# Patient Record
Sex: Female | Born: 1965 | Race: Black or African American | Hispanic: No | Smoking: Former smoker
Health system: Southern US, Community
[De-identification: ages and names within clinical notes are randomized; demographics above are authoritative.]

## PROBLEM LIST (undated history)

## (undated) DIAGNOSIS — R202 Paresthesia of skin: Secondary | ICD-10-CM

## (undated) DIAGNOSIS — E7800 Pure hypercholesterolemia, unspecified: Secondary | ICD-10-CM

## (undated) DIAGNOSIS — E119 Type 2 diabetes mellitus without complications: Secondary | ICD-10-CM

## (undated) DIAGNOSIS — F32A Depression, unspecified: Secondary | ICD-10-CM

## (undated) DIAGNOSIS — I10 Essential (primary) hypertension: Secondary | ICD-10-CM

## (undated) DIAGNOSIS — Z973 Presence of spectacles and contact lenses: Secondary | ICD-10-CM

## (undated) DIAGNOSIS — F329 Major depressive disorder, single episode, unspecified: Secondary | ICD-10-CM

## (undated) DIAGNOSIS — R51 Headache: Secondary | ICD-10-CM

## (undated) DIAGNOSIS — F419 Anxiety disorder, unspecified: Secondary | ICD-10-CM

## (undated) DIAGNOSIS — M1990 Unspecified osteoarthritis, unspecified site: Secondary | ICD-10-CM

## (undated) DIAGNOSIS — R519 Headache, unspecified: Secondary | ICD-10-CM

## (undated) DIAGNOSIS — J302 Other seasonal allergic rhinitis: Secondary | ICD-10-CM

## (undated) DIAGNOSIS — K429 Umbilical hernia without obstruction or gangrene: Secondary | ICD-10-CM

## (undated) DIAGNOSIS — N189 Chronic kidney disease, unspecified: Secondary | ICD-10-CM

## (undated) DIAGNOSIS — R200 Anesthesia of skin: Secondary | ICD-10-CM

## (undated) HISTORY — PX: MULTIPLE TOOTH EXTRACTIONS: SHX2053

## (undated) HISTORY — DX: Umbilical hernia without obstruction or gangrene: K42.9

## (undated) HISTORY — PX: TONSILLECTOMY: SUR1361

## (undated) HISTORY — DX: Essential (primary) hypertension: I10

---
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
## 2001-07-26 ENCOUNTER — Other Ambulatory Visit: Admission: RE | Admit: 2001-07-26 | Discharge: 2001-07-26 | Payer: Self-pay | Admitting: Family Medicine

## 2003-04-02 ENCOUNTER — Other Ambulatory Visit: Admission: RE | Admit: 2003-04-02 | Discharge: 2003-04-02 | Payer: Self-pay | Admitting: Family Medicine

## 2003-04-02 ENCOUNTER — Other Ambulatory Visit: Admission: RE | Admit: 2003-04-02 | Discharge: 2003-04-02 | Payer: Self-pay | Admitting: Anesthesiology

## 2005-07-21 ENCOUNTER — Other Ambulatory Visit: Admission: RE | Admit: 2005-07-21 | Discharge: 2005-07-21 | Payer: Self-pay | Admitting: Family Medicine

## 2006-11-11 ENCOUNTER — Other Ambulatory Visit: Admission: RE | Admit: 2006-11-11 | Discharge: 2006-11-11 | Payer: Self-pay | Admitting: Family Medicine

## 2007-07-20 ENCOUNTER — Emergency Department (HOSPITAL_COMMUNITY): Admission: EM | Admit: 2007-07-20 | Discharge: 2007-07-21 | Payer: Self-pay | Admitting: Emergency Medicine

## 2007-10-18 ENCOUNTER — Encounter: Admission: RE | Admit: 2007-10-18 | Discharge: 2007-11-21 | Payer: Self-pay | Admitting: Family Medicine

## 2008-05-14 ENCOUNTER — Other Ambulatory Visit: Admission: RE | Admit: 2008-05-14 | Discharge: 2008-05-14 | Payer: Self-pay | Admitting: Family Medicine

## 2009-05-15 ENCOUNTER — Other Ambulatory Visit: Admission: RE | Admit: 2009-05-15 | Discharge: 2009-05-15 | Payer: Self-pay | Admitting: Family Medicine

## 2010-02-09 ENCOUNTER — Ambulatory Visit: Payer: Self-pay | Admitting: Family Medicine

## 2010-02-09 DIAGNOSIS — F329 Major depressive disorder, single episode, unspecified: Secondary | ICD-10-CM

## 2010-02-09 DIAGNOSIS — J309 Allergic rhinitis, unspecified: Secondary | ICD-10-CM | POA: Insufficient documentation

## 2010-02-15 ENCOUNTER — Encounter (INDEPENDENT_AMBULATORY_CARE_PROVIDER_SITE_OTHER): Payer: Self-pay | Admitting: *Deleted

## 2010-08-31 ENCOUNTER — Encounter: Payer: Self-pay | Admitting: Family Medicine

## 2010-09-07 LAB — CONVERTED CEMR LAB
Basophils Relative: 0 % (ref 0–1)
CO2: 23 meq/L (ref 19–32)
Chloride: 105 meq/L (ref 96–112)
Creatinine, Ser: 1.3 mg/dL — ABNORMAL HIGH (ref 0.40–1.20)
Eosinophils Absolute: 0.1 10*3/uL (ref 0.0–0.7)
Glucose, Bld: 146 mg/dL — ABNORMAL HIGH (ref 70–99)
Glucose, Urine, Semiquant: NEGATIVE
Ketones, urine, test strip: NEGATIVE
Lymphocytes Relative: 50 % — ABNORMAL HIGH (ref 12–46)
Lymphs Abs: 2.5 10*3/uL (ref 0.7–4.0)
MCHC: 31.9 g/dL (ref 30.0–36.0)
Monocytes Relative: 7 % (ref 3–12)
Neutro Abs: 2 10*3/uL (ref 1.7–7.7)
Platelets: 325 10*3/uL (ref 150–400)
Potassium: 4.6 meq/L (ref 3.5–5.3)
Sodium: 138 meq/L (ref 135–145)
Specific Gravity, Urine: 1.02
TSH: 1.293 microintl units/mL (ref 0.350–4.500)
Trich, Wet Prep: NONE SEEN
Urobilinogen, UA: 0.2
WBC Urine, dipstick: NEGATIVE
WBC, Wet Prep HPF POC: NONE SEEN
Yeast Wet Prep HPF POC: NONE SEEN
pH: 5

## 2010-09-09 NOTE — Letter (Signed)
Summary: Handout Printed  Printed Handout:  - Fibroids (Leiomyoma's)  Appended Document:  Pt. inquired at the front desk without stating she was a pt. as to when Dr. Laural Benes would next be working.   I told her next weekend and she said OK and walked off.  She returned about 1-2 hours later and stated she had  seen Dr. Laural Benes and had an allergic reaction to medications that had been prescribed to her last week. I consulted with Dr. Thurmond Butts and he said we should see her.  I told the pt. Dr.wade could see her today. She then became agitated, refused to be seen, and walked off angrily.  R. Hils LPN

## 2010-09-09 NOTE — Letter (Signed)
Summary: Handout Printed  Printed Handout:  - *Patient Instructions-Urgent Care 

## 2010-09-09 NOTE — Assessment & Plan Note (Signed)
Summary: POSSIBLE UTI/EVM   Vital Signs:  Patient Profile:   45 Years Old Female CC:      irregular periods started last month // blood in urine now Height:     63 inches Weight:      284 pounds BMI:     50.49 BSA:     2.25 Temp:     98 degrees F oral Pulse rate:   80 / minute Pulse rhythm:   regular Resp:     20 per minute BP sitting:   130 / 80  (left arm) Cuff size:   large  Pt. in pain?   yes    Location:   pelvis    Intensity:   2  Vitals Entered By: Providence Crosby LPN (February 09, 2950 1:17 PM)                   Current Allergies (reviewed today): ! * TOMATOES ! * SEAFOOD ! * EGGSHistory of Present Illness History from: patient Reason for visit: see chief complaint Chief Complaint: irregular periods started last month // blood in urine now History of Present Illness: Patient states last month period lasted one week and had severe cramping then this month she had clots.  Yesterday back pain and mid epigastric pain started but no nausea or vomiting or fever.  The patient said that she is concerned because her menses for this month have persisted for 2 weeks.  She says that she talked with her mom and she says that the family has a history of fibroids.  Pt says that the pains are not sharp and that the pains are mild.  No history of kidney stones.  Pt says that she also has been having mood changes and anxiety and stress.  Pt says that she has had some depression.   No constipation or fever or skin changes reported.  Pt reports that the lower abdominal pains and suprapubic pains have been intermittent and no predictable except when pressing on the abdomen.    Current Problems: ? 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) ABDOMINAL PAIN, SUPRAPUBIC (ICD-789.09) DEPRESSION (ICD-311) ALLERGIC RHINITIS (ICD-477.9) MENORRHAGIA (ICD-626.2) ABDOMINAL CRAMPS (ICD-789.00) PELVIC PAIN, ACUTE (ICD-789.09) GROSS HEMATURIA (ICD-599.71)   Current Meds ZYRTEC HIVES RELIEF 10 MG TABS (CETIRIZINE  HCL) takes 1/4 th of a pill as needed CIPROFLOXACIN HCL 500 MG TABS (CIPROFLOXACIN HCL) take 1 by mouth two times a day until all gone FLAGYL 500 MG TABS (METRONIDAZOLE) take 1 by mouth three times a day until all gone NAPROXEN SODIUM 550 MG TABS (NAPROXEN SODIUM) take 1 by mouth Q 12 hours with food as needed for abdominal, pelvic cramps  REVIEW OF SYSTEMS Constitutional Symptoms       Complains of weight gain.     Denies fever, chills, night sweats, weight loss, and fatigue.      Comments: state stays hot and prespiring Eyes       Denies change in vision, eye pain, eye discharge, glasses, contact lenses, and eye surgery. Ear/Nose/Throat/Mouth       Complains of sinus problems.      Denies hearing loss/aids, change in hearing, ear pain, ear discharge, dizziness, frequent runny nose, frequent nose bleeds, sore throat, hoarseness, and tooth pain or bleeding.  Respiratory       Denies dry cough, productive cough, wheezing, shortness of breath, asthma, bronchitis, and emphysema/COPD.  Cardiovascular       Denies murmurs, chest pain, and tires easily with exhertion.    Gastrointestinal  Complains of stomach pain.      Denies nausea/vomiting, diarrhea, constipation, blood in bowel movements, and indigestion.      Comments: suprapubic tenderness reported Genitourniary       Complains of painful urination, blood or discharge from vagina, and loss of urinary control.      Denies kidney stones. Neurological       Denies paralysis, seizures, and fainting/blackouts. Musculoskeletal       Complains of muscle pain and joint pain.      Denies joint stiffness, decreased range of motion, redness, swelling, muscle weakness, and gout.  Skin       Denies bruising, unusual mles/lumps or sores, and hair/skin or nail changes.  Psych       Complains of mood changes, anxiety/stress, and depression.      Denies temper/anger issues, speech problems, and sleep problems.  Past History:  Past Medical  History: Allergic rhinitis Depression  Past Surgical History: Denies surgical history  Family History: Father: deceased at 13 yo diabetes and high blood pressure Mother: alive diabetic Siblings: 1 brother alive and well 1 sister alive and well Uterine leiomyoma in the women in the family  Social History: Marital Status: Married Children: She has a daughter that just graduated high school Current Smoker - 1/2 ppd Alcohol use-yes Regular exercise-no  Works at [employer] Smoking Status:  current Does Patient Exercise:  no   Physical Exam General appearance: well developed, well nourished, no acute distress Head: normocephalic, atraumatic Eyes: conjunctivae and lids normal Pupils: equal, round, reactive to light Ears: normal, no lesions or deformities Nasal: mucosa pink, nonedematous, no septal deviation, turbinates normal Oral/Pharynx: tongue normal, posterior pharynx without erythema or exudate, dry mucus membranes Neck: neck supple,  trachea midline, no masses Thyroid: soft, no nodules or masses palpated Chest/Lungs: no rales, wheezes, or rhonchi bilateral, breath sounds equal without effort Heart: regular rate and  rhythm, no murmur Abdomen: soft, non-tender without obvious organomegaly GU: suprapubic tenderness, mild left CVA tenderness, pelvic exam: blood seen in vaginal introitus, cervix nontender, no discharge seen, no pelvic masses palpated Extremities: normal extremities Neurological: grossly intact and non-focal Back: left CVA tenderness Skin: no obvious rashes or lesions MSE: oriented to time, place, and person Assessment Problems:   ? 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 of LEIOMYOMA, UTERUS  (ICD-218.9) ABDOMINAL PAIN, SUPRAPUBIC (ICD-789.09) DEPRESSION (ICD-311) ALLERGIC RHINITIS (ICD-477.9) MENORRHAGIA (ICD-626.2) ABDOMINAL CRAMPS (ICD-789.00) PELVIC PAIN, ACUTE (ICD-789.09) GROSS HEMATURIA (ICD-599.71)   Patient Education: Patient and/or caregiver instructed in the following: rest, fluids, quit smoking. Demonstrates willingness to comply.  Plan New Medications/Changes: NAPROXEN SODIUM 550 MG TABS (NAPROXEN SODIUM) take 1 by mouth Q 12 hours with food as needed for abdominal, pelvic cramps  #10 x 0, 02/09/2010, Erric Machnik MD FLAGYL 500 MG TABS (METRONIDAZOLE) take 1 by mouth three times a day until all gone  #21 x 0, 02/09/2010, Chrislyn Seedorf MD CIPROFLOXACIN HCL 500 MG TABS (CIPROFLOXACIN HCL) take 1 by mouth two times a day until all gone  #14 x 0, 02/09/2010, Standley Dakins MD  New Orders: CBC-[facility] [85027] TSH-[facility] [[id number]] Basic Met-[facility] [09811-91478] Urine Culture-[facility] [[id number]] T-Wet Prep for Kizzie Bane Cells [[id number]] New Patient Level IV [28413] Prescription Created Electronically [phone] Follow Up: Follow up in 1-2 days if no improvement, Follow up with Primary Physician Follow Up: with gynecologist in 2 days  for pelvic ultrasound  The patient and/or caregiver has been counseled thoroughly with regard to medications prescribed including dosage, schedule, interactions, rationale for use, and possible side effects and they verbalize understanding.  Diagnoses and expected course of recovery discussed and will return if not improved as expected or if the condition worsens. Patient and/or caregiver verbalized understanding.  Prescriptions: NAPROXEN SODIUM 550 MG TABS (NAPROXEN SODIUM) take 1 by mouth Q 12 hours with food as needed for abdominal, pelvic cramps  #10 x 0   Entered and Authorized by:   Standley Dakins MD   Signed by:   Standley Dakins MD on 02/09/2010   Method used:   Electronically to        [facility]  [address]  [phone][facility] (retail)       [address]       [place], [place]  [id number]       Ph: [phone] or [phone]       Fax: [phone]   RxID:   [id number] FLAGYL 500 MG TABS (METRONIDAZOLE) take 1 by mouth three times a day until all gone  #21 x 0   Entered and Authorized by:   Standley Dakins MD   Signed by:   Standley Dakins MD on 02/09/2010   Method used:   [facility] to        [facility]  [address] [phone]* (retail)       [address]       [place], [place]  [address]       Ph: [phone] or [phone]       Fax: [phone]   RxID:   [id number] CIPROFLOXACIN HCL 500 MG TABS (CIPROFLOXACIN HCL) take 1 by mouth two times a day until all gone  #14 x 0   Entered and Authorized by:   Standley Dakins MD   Signed by:   Standley Dakins MD on 02/09/2010   Method used:   [facility]        [facility]  [address] [phone]* (retail)       [address]       [place], [place]  [id number]       Ph: [phone] or [phone]       Fax: [phone]   RxID:   [id number]   Patient Instructions: 1)  Please follow up with your PCP and gynecologist in the next 2-3 days to have a pelvic ultrasound done and to follow up. I am concerned that you may have a uterine fibroid.  2)  Go to the ER if your symptoms become worse or new changes develop.  3)  Take your antibiotics until all gone. 4)  We should be calling you about your labs in the next several days.  If you haven't heard from [place] about your labs, please call the clinic and ask about your labs.  5)  Stop taking the Aleve, the Naproxen will replace the Aleve.  6)  There is no on-call provider or services available at this clinic during off-hours (when the clinic is closed).  If you developed a problem or concern that required immediate attention go the the nearest available urgent care or emergency department for medical care.   7)   This [facility] is not intended to be a primary care clinic.  The patient is always better served by the  continuity of care and the provider/patient relationships developed with their dedicated primary care provider.  The patient was told to be sure to follow up as soon as possible with their primary care provider to discuss treatments received and to receive further examination and testing.  The patient verbalized understanding. The will f/u with PCP ASAP.  8)  Drink as much fluid as you can tolerate for the next few days. 9)  Tobacco is very bad for your health and your loved ones! You Should stop smoking!. 10)  Stop Smoking Tips: Choose a Quit date. Cut down before the Quit date. decide what you will do as a substitute when you feel the urge to smoke(gum,toothpick,exercise).  Orders Added: 1)  CBC-[facility] [85027] 2)  TSH-[facility] [[id number]] 3)  Basic Met-[facility] [09811-91478] 4)  Urine Culture-[facility] [[id number]] 5)  T-Wet Prep for Christoper Allegra, Clue Cells [[id number]] 6)  New Patient Level IV [99204] 7)  Prescription Created Electronically [phone]  The risks, benefits and possible side effects were clearly explained and discussed with the patient.  The patient verbalized clear understanding.  The patient was given instructions to return if symptoms don't improve, worsen or new changes develop.  If it is not during clinic hours and the patient cannot get back to this clinic then the patient was told to seek medical care at an available urgent care or emergency department.  The patient verbalized understanding.    The patient was informed that there is no on-call provider or services available at this clinic during off-hours (when the clinic is closed).  If the patient developed a problem or concern that required immediate attention, the patient was advised to go the the nearest available urgent care or emergency department for medical care.  The patient verbalized understanding.     It was clearly  explained to the patient that this [facility] is not intended to be a primary care clinic.  The patient is always better served by the continuity of care and the provider/patient relationships developed with their dedicated primary care provider.  The patient was told to be sure to follow up as soon as possible with their primary care provider to discuss treatments received and to receive further examination and testing.  The patient verbalized understanding. The will f/u with PCP ASAP.   Rodney Langton, M.D., F.A.A.F.P.  February 09, 2010   Laboratory Results   Urine Tests  Date/Time Recieved: February 09, 2010 1:32 PM Date/Time Reported: February 09, 2010 1:32 PM  Routine Urinalysis   Color: amber Appearance: Cloudy Glucose: negative   (Normal Range: Negative) Bilirubin: negative   (Normal Range: Negative) Ketone: negative   (Normal Range: Negative) Spec. Gravity: 1.020   (Normal Range: 1.003-1.035) Blood: large   (Normal Range: Negative) pH: 5.0   (Normal Range: 5.0-8.0) Protein: trace   (Normal Range: Negative) Urobilinogen: 0.2   (Normal Range: 0-1) Nitrite: negative   (Normal Range: Negative) Leukocyte Esterace: negative   (Normal Range: Negative)

## 2010-11-18 ENCOUNTER — Emergency Department (HOSPITAL_COMMUNITY)
Admission: EM | Admit: 2010-11-18 | Discharge: 2010-11-19 | Disposition: A | Discharge status: Home / Self Care | Payer: No Typology Code available for payment source | Attending: Emergency Medicine | Admitting: Emergency Medicine

## 2010-11-18 ENCOUNTER — Emergency Department (HOSPITAL_COMMUNITY): Payer: No Typology Code available for payment source

## 2010-11-18 DIAGNOSIS — M5137 Other intervertebral disc degeneration, lumbosacral region: Secondary | ICD-10-CM | POA: Insufficient documentation

## 2010-11-18 DIAGNOSIS — M545 Low back pain, unspecified: Secondary | ICD-10-CM | POA: Insufficient documentation

## 2010-11-18 DIAGNOSIS — M25569 Pain in unspecified knee: Secondary | ICD-10-CM | POA: Insufficient documentation

## 2010-11-18 DIAGNOSIS — Q762 Congenital spondylolisthesis: Secondary | ICD-10-CM | POA: Insufficient documentation

## 2010-11-18 DIAGNOSIS — S8000XA Contusion of unspecified knee, initial encounter: Secondary | ICD-10-CM | POA: Insufficient documentation

## 2010-11-18 DIAGNOSIS — M546 Pain in thoracic spine: Secondary | ICD-10-CM | POA: Insufficient documentation

## 2010-11-18 DIAGNOSIS — M51379 Other intervertebral disc degeneration, lumbosacral region without mention of lumbar back pain or lower extremity pain: Secondary | ICD-10-CM | POA: Insufficient documentation

## 2011-05-18 LAB — BASIC METABOLIC PANEL
BUN: 7
GFR calc Af Amer: 51 — ABNORMAL LOW
Glucose, Bld: 100 — ABNORMAL HIGH
Sodium: 136

## 2011-05-18 LAB — DIFFERENTIAL
Basophils Absolute: 0
Basophils Relative: 1
Eosinophils Relative: 1
Lymphocytes Relative: 36
Lymphs Abs: 2.3
Monocytes Relative: 7
Neutro Abs: 3.5

## 2011-05-18 LAB — CBC
HCT: 36.8
Hemoglobin: 12.4
MCHC: 33.7
MCV: 74.5 — ABNORMAL LOW
RBC: 4.93
WBC: 6.3

## 2011-06-03 ENCOUNTER — Ambulatory Visit (INDEPENDENT_AMBULATORY_CARE_PROVIDER_SITE_OTHER): Payer: 59 | Admitting: Women's Health

## 2011-06-03 ENCOUNTER — Encounter: Payer: Self-pay | Admitting: Women's Health

## 2011-06-03 ENCOUNTER — Other Ambulatory Visit (HOSPITAL_COMMUNITY)
Admission: RE | Admit: 2011-06-03 | Discharge: 2011-06-03 | Disposition: A | Discharge status: Home / Self Care | Payer: 59 | Source: Ambulatory Visit | Attending: Women's Health | Admitting: Women's Health

## 2011-06-03 VITALS — BP 130/80 | Ht 62.0 in | Wt 268.0 lb

## 2011-06-03 DIAGNOSIS — Z01419 Encounter for gynecological examination (general) (routine) without abnormal findings: Secondary | ICD-10-CM

## 2011-06-03 DIAGNOSIS — Z13220 Encounter for screening for lipoid disorders: Secondary | ICD-10-CM

## 2011-06-03 DIAGNOSIS — Z833 Family history of diabetes mellitus: Secondary | ICD-10-CM

## 2011-06-03 NOTE — Patient Instructions (Addendum)
Fish oil supplement Vit D 2000 Have FUN !!!!  Schedule mammogram   Need yearly

## 2011-06-03 NOTE — Progress Notes (Signed)
Jasmine [name] October 30, 1965 [id number]    History:    The patient presents for annual exam.  Works at [employer]. Daughter Jasmine [name], 29, student doing well, has had Gardasil.  Past medical history, past surgical history, family history and social history were all reviewed and documented in the EPIC chart.   ROS:  A  ROS was performed and pertinent positives and negatives are included in the history.  Exam:  Filed Vitals:   06/03/11 0803  BP: 130/80    General appearance:  Normal Head/Neck:  Normal, without cervical or supraclavicular adenopathy. Thyroid:  Symmetrical, normal in size, without palpable masses or nodularity. Respiratory  Effort:  Normal  Auscultation:  Clear without wheezing or rhonchi Cardiovascular  Auscultation:  Regular rate, without rubs, murmurs or gallops  Edema/varicosities:  Not grossly evident Abdominal  Soft,nontender, without masses, guarding or rebound.  Liver/spleen:  No organomegaly noted  Hernia:  None appreciated  Skin  Inspection:  Grossly normal  Palpation:  Grossly normal Neurologic/psychiatric  Orientation:  Normal with appropriate conversation.  Mood/affect:  Normal  Genitourinary    Breasts: Examined lying and sitting.     Right: Without masses, retractions, discharge or axillary adenopathy.     Left: Without masses, retractions, discharge or axillary adenopathy.   Inguinal/mons:  Normal without inguinal adenopathy  External genitalia:  Normal  BUS/Urethra/Skene's glands:  Normal  Bladder:  Normal  Vagina:  Normal  Cervix:  Normal  Uterus:  normal in size, shape and contour.  Midline and mobile  Adnexa/parametria:     Rt: Without masses or tenderness.   Lt: Without masses or tenderness.  Anus and perineum: Normal  Digital rectal exam: Normal sphincter tone without palpated masses or tenderness  Assessment/Plan:  45 y.o.DBF G1P1  for annual exam without complaint. Monthly 3-4 day cycle, not sexually active. History of normal Paps,  GDM. Smokes one pack per week. Reviewed importance of no smoking, ways to decrease and quit.  Normal GYN exam Obese Smoker  Plan: SBEs, had a normal baseline but has not been back will schedule, did review importance of annual screening. Encouraged Weight Watchers for weight loss for health,(numerous family members with diabetes), cutting calories and increasing exercise. Condoms encouraged if becomes sexually active or return to the office for contraception counseling. Vitamin D and calcium rich diet encouraged.  CBC, glucose, lipid profile, UA and Pap     Harrington Challenger [facility], 8:43 AM 06/03/2011

## 2011-06-04 ENCOUNTER — Other Ambulatory Visit: Payer: Self-pay | Admitting: *Deleted

## 2011-06-04 DIAGNOSIS — R7309 Other abnormal glucose: Secondary | ICD-10-CM

## 2011-06-04 DIAGNOSIS — E7800 Pure hypercholesterolemia, unspecified: Secondary | ICD-10-CM

## 2012-06-03 ENCOUNTER — Encounter: Payer: Self-pay | Admitting: Women's Health

## 2012-06-03 ENCOUNTER — Ambulatory Visit (INDEPENDENT_AMBULATORY_CARE_PROVIDER_SITE_OTHER): Payer: 59 | Admitting: Women's Health

## 2012-06-03 VITALS — BP 136/86 | Ht 62.75 in | Wt 283.0 lb

## 2012-06-03 DIAGNOSIS — E785 Hyperlipidemia, unspecified: Secondary | ICD-10-CM

## 2012-06-03 DIAGNOSIS — Z6841 Body Mass Index (BMI) 40.0 and over, adult: Secondary | ICD-10-CM

## 2012-06-03 DIAGNOSIS — R109 Unspecified abdominal pain: Secondary | ICD-10-CM

## 2012-06-03 DIAGNOSIS — Z01419 Encounter for gynecological examination (general) (routine) without abnormal findings: Secondary | ICD-10-CM

## 2012-06-03 DIAGNOSIS — Z833 Family history of diabetes mellitus: Secondary | ICD-10-CM

## 2012-06-03 DIAGNOSIS — E079 Disorder of thyroid, unspecified: Secondary | ICD-10-CM

## 2012-06-03 LAB — CBC WITH DIFFERENTIAL/PLATELET
Basophils Absolute: 0.1 10*3/uL (ref 0.0–0.1)
Basophils Relative: 1 % (ref 0–1)
Eosinophils Relative: 2 % (ref 0–5)
Hemoglobin: 12.4 g/dL (ref 12.0–15.0)
MCH: 24.2 pg — ABNORMAL LOW (ref 26.0–34.0)
Monocytes Absolute: 0.3 10*3/uL (ref 0.1–1.0)
Monocytes Relative: 6 % (ref 3–12)
Platelets: 291 10*3/uL (ref 150–400)
RBC: 5.12 MIL/uL — ABNORMAL HIGH (ref 3.87–5.11)
WBC: 4.2 10*3/uL (ref 4.0–10.5)

## 2012-06-03 LAB — LIPID PANEL
LDL Cholesterol: 152 mg/dL — ABNORMAL HIGH (ref 0–99)
Total CHOL/HDL Ratio: 5.4 Ratio
Triglycerides: 161 mg/dL — ABNORMAL HIGH (ref <150)
VLDL: 32 mg/dL (ref 0–40)

## 2012-06-03 NOTE — Progress Notes (Signed)
Jasmine [name] 1966/03/05 [id number]    History:    The patient presents for annual exam.  Regular monthly cycle not sexually active for greater than one year. Morbid obesity. Complaint of intermittent mid abdominal epigastric pain. Denies nausea, fever, changes in bowel elimination. Last mammogram 2011, normal. History of normal Paps. Had elevated glucose of 118 last year without followup.   Past medical history, past surgical history, family history and social history were all reviewed and documented in the EPIC chart. Works at [employer]. Daughter Jasmine [name] doing well at [employer].   ROS:  A  ROS was performed and pertinent positives and negatives are included in the history.  Exam:  Filed Vitals:   06/03/12 0827  BP: 136/86    General appearance:  Normal Head/Neck:  Normal, without cervical or supraclavicular adenopathy. Thyroid:  Symmetrical, normal in size, without palpable masses or nodularity. Respiratory  Effort:  Normal  Auscultation:  Clear without wheezing or rhonchi Cardiovascular  Auscultation:  Regular rate, without rubs, murmurs or gallops  Edema/varicosities:  Not grossly evident Abdominal  Soft,nontender, without masses, guarding or rebound.  Liver/spleen:  No organomegaly noted  Hernia:  None appreciated  Skin  Inspection:  Grossly normal  Palpation:  Grossly normal Neurologic/psychiatric  Orientation:  Normal with appropriate conversation.  Mood/affect:  Normal  Genitourinary    Breasts: Examined lying and sitting.     Right: Without masses, retractions, discharge or axillary adenopathy.     Left: Without masses, retractions, discharge or axillary adenopathy.   Inguinal/mons:  Normal without inguinal adenopathy  External genitalia:  Normal  BUS/Urethra/Skene's glands:  Normal  Bladder:  Normal  Vagina:  Normal  Cervix:  Normal  Uterus:  normal in size,  Midline and mobile/ difficult exam due to abdominal obesity  Adnexa/parametria:     Rt: Without masses or  tenderness.   Lt: Without masses or tenderness.  Anus and perineum: Normal  Digital rectal exam: Normal sphincter tone without palpated masses or tenderness  Assessment/Plan:  46 y.o. separated BF G1P1 for annual exam with complaint of intermittent epigastric pain.  Morbid obesity/trunk obesity History of elevated glucose/abnormal lipid panel without followup.  Overdue for mammogram.  Plan: Discussion on need for weight loss for health. Bariatric clinic number at [place] given. SBE's, instructed to schedule mammogram, [facility] phone number given. Encouraged to continue daily walking, vitamin D 1000 daily and fish oil supplement. CBC, glucose, lipid panel, TSH, UA. Pap normal 2012, new screening guidelines reviewed. Condoms encouraged when sexually active, declines need for contraception or STD screen. GYN ultrasound, limited exam and abdominal pain. Reviewed importance of weight loss in relationship to epigastric pain. Blood pressure 136/88, instructed to monitor and if continues greater than 130/80 to followup with primary care.  Harrington Challenger [facility], 9:09 AM 06/03/2012

## 2012-06-03 NOTE — Patient Instructions (Addendum)
Vit D 1000 daily Mammogram please schedule!!! Fish oil fish daily Health Maintenance, Females A healthy lifestyle and preventative care can promote health and wellness.  Maintain regular health, dental, and eye exams.  Eat a healthy diet. Foods like vegetables, fruits, whole grains, low-fat dairy products, and lean protein foods contain the nutrients you need without too many calories. Decrease your intake of foods high in solid fats, added sugars, and salt. Get information about a proper diet from your caregiver, if necessary.  Regular physical exercise is one of the most important things you can do for your health. Most adults should get at least 150 minutes of moderate-intensity exercise (any activity that increases your heart rate and causes you to sweat) each week. In addition, most adults need muscle-strengthening exercises on 2 or more days a week.   Maintain a healthy weight. The body mass index (BMI) is a screening tool to identify possible weight problems. It provides an estimate of body fat based on height and weight. Your caregiver can help determine your BMI, and can help you achieve or maintain a healthy weight. For adults 20 years and older:  A BMI below 18.5 is considered underweight.  A BMI of 18.5 to 24.9 is normal.  A BMI of 25 to 29.9 is considered overweight.  A BMI of 30 and above is considered obese.  Maintain normal blood lipids and cholesterol by exercising and minimizing your intake of saturated fat. Eat a balanced diet with plenty of fruits and vegetables. Blood tests for lipids and cholesterol should begin at age 61 and be repeated every 5 years. If your lipid or cholesterol levels are high, you are over 50, or you are a high risk for heart disease, you may need your cholesterol levels checked more frequently.Ongoing high lipid and cholesterol levels should be treated with medicines if diet and exercise are not effective.  If you smoke, find out from your  caregiver how to quit. If you do not use tobacco, do not start.  If you are pregnant, do not drink alcohol. If you are breastfeeding, be very cautious about drinking alcohol. If you are not pregnant and choose to drink alcohol, do not exceed 1 drink per day. One drink is considered to be 12 ounces (355 mL) of beer, 5 ounces (148 mL) of wine, or 1.5 ounces (44 mL) of liquor.  Avoid use of street drugs. Do not share needles with anyone. Ask for help if you need support or instructions about stopping the use of drugs.  High blood pressure causes heart disease and increases the risk of stroke. Blood pressure should be checked at least every 1 to 2 years. Ongoing high blood pressure should be treated with medicines, if weight loss and exercise are not effective.  If you are 64 to 44 years old, ask your caregiver if you should take aspirin to prevent strokes.  Diabetes screening involves taking a blood sample to check your fasting blood sugar level. This should be done once every 3 years, after age 65, if you are within normal weight and without risk factors for diabetes. Testing should be considered at a younger age or be carried out more frequently if you are overweight and have at least 1 risk factor for diabetes.  Breast cancer screening is essential preventative care for women. You should practice "breast self-awareness." This means understanding the normal appearance and feel of your breasts and may include breast self-examination. Any changes detected, no matter how small, should be reported  to a caregiver. Women in their 52s and 30s should have a clinical breast exam (CBE) by a caregiver as part of a regular health exam every 1 to 3 years. After age 2, women should have a CBE every year. Starting at age 36, women should consider having a mammogram (breast X-ray) every year. Women who have a family history of breast cancer should talk to their caregiver about genetic screening. Women at a high risk of  breast cancer should talk to their caregiver about having an MRI and a mammogram every year.  The Pap test is a screening test for cervical cancer. Women should have a Pap test starting at age 58. Between ages 60 and 66, Pap tests should be repeated every 2 years. Beginning at age 30, you should have a Pap test every 3 years as long as the past 3 Pap tests have been normal. If you had a hysterectomy for a problem that was not cancer or a condition that could lead to cancer, then you no longer need Pap tests. If you are between ages 56 and 76, and you have had normal Pap tests going back 10 years, you no longer need Pap tests. If you have had past treatment for cervical cancer or a condition that could lead to cancer, you need Pap tests and screening for cancer for at least 20 years after your treatment. If Pap tests have been discontinued, risk factors (such as a new sexual partner) need to be reassessed to determine if screening should be resumed. Some women have medical problems that increase the chance of getting cervical cancer. In these cases, your caregiver may recommend more frequent screening and Pap tests.  The human papillomavirus (HPV) test is an additional test that may be used for cervical cancer screening. The HPV test looks for the virus that can cause the cell changes on the cervix. The cells collected during the Pap test can be tested for HPV. The HPV test could be used to screen women aged 59 years and older, and should be used in women of any age who have unclear Pap test results. After the age of 22, women should have HPV testing at the same frequency as a Pap test.  Colorectal cancer can be detected and often prevented. Most routine colorectal cancer screening begins at the age of 25 and continues through age 33. However, your caregiver may recommend screening at an earlier age if you have risk factors for colon cancer. On a yearly basis, your caregiver may provide home test kits to check  for hidden blood in the stool. Use of a small camera at the end of a tube, to directly examine the colon (sigmoidoscopy or colonoscopy), can detect the earliest forms of colorectal cancer. Talk to your caregiver about this at age 32, when routine screening begins. Direct examination of the colon should be repeated every 5 to 10 years through age 13, unless early forms of pre-cancerous polyps or small growths are found.  Hepatitis C blood testing is recommended for all people born from [birth year] through 1963 and any individual with known risks for hepatitis C.  Practice safe sex. Use condoms and avoid high-risk sexual practices to reduce the spread of sexually transmitted infections (STIs). Sexually active women aged 35 and younger should be checked for Chlamydia, which is a common sexually transmitted infection. Older women with new or multiple partners should also be tested for Chlamydia. Testing for other STIs is recommended if you are sexually active and  at increased risk.  Osteoporosis is a disease in which the bones lose minerals and strength with aging. This can result in serious bone fractures. The risk of osteoporosis can be identified using a bone density scan. Women ages 24 and over and women at risk for fractures or osteoporosis should discuss screening with their caregivers. Ask your caregiver whether you should be taking a calcium supplement or vitamin D to reduce the rate of osteoporosis.  Menopause can be associated with physical symptoms and risks. Hormone replacement therapy is available to decrease symptoms and risks. You should talk to your caregiver about whether hormone replacement therapy is right for you.  Use sunscreen with a sun protection factor (SPF) of 30 or greater. Apply sunscreen liberally and repeatedly throughout the day. You should seek shade when your shadow is shorter than you. Protect yourself by wearing long sleeves, pants, a wide-brimmed hat, and sunglasses year round,  whenever you are outdoors.  Notify your caregiver of new moles or changes in moles, especially if there is a change in shape or color. Also notify your caregiver if a mole is larger than the size of a pencil eraser.  Stay current with your immunizations. Document Released: 02/09/2011 Document Revised: 10/19/2011 Document Reviewed: 02/09/2011 [facility] Patient Information 2013 [place], [place].

## 2012-06-04 LAB — URINALYSIS W MICROSCOPIC + REFLEX CULTURE
Bacteria, UA: NONE SEEN
Bilirubin Urine: NEGATIVE
Crystals: NONE SEEN
Glucose, UA: NEGATIVE mg/dL
Protein, ur: NEGATIVE mg/dL
Specific Gravity, Urine: 1.013 (ref 1.005–1.030)
pH: 5.5 (ref 5.0–8.0)

## 2012-06-08 ENCOUNTER — Other Ambulatory Visit: Payer: Self-pay | Admitting: Women's Health

## 2012-06-08 DIAGNOSIS — R7309 Other abnormal glucose: Secondary | ICD-10-CM

## 2012-06-13 ENCOUNTER — Encounter: Payer: Self-pay | Admitting: Women's Health

## 2012-06-13 ENCOUNTER — Ambulatory Visit (INDEPENDENT_AMBULATORY_CARE_PROVIDER_SITE_OTHER): Payer: 59

## 2012-06-13 ENCOUNTER — Other Ambulatory Visit: Payer: Self-pay | Admitting: Gynecology

## 2012-06-13 ENCOUNTER — Ambulatory Visit (INDEPENDENT_AMBULATORY_CARE_PROVIDER_SITE_OTHER): Payer: 59 | Admitting: Women's Health

## 2012-06-13 DIAGNOSIS — N831 Corpus luteum cyst of ovary, unspecified side: Secondary | ICD-10-CM

## 2012-06-13 DIAGNOSIS — Z833 Family history of diabetes mellitus: Secondary | ICD-10-CM

## 2012-06-13 DIAGNOSIS — R109 Unspecified abdominal pain: Secondary | ICD-10-CM

## 2012-06-13 DIAGNOSIS — E65 Localized adiposity: Secondary | ICD-10-CM

## 2012-06-13 DIAGNOSIS — R140 Abdominal distension (gaseous): Secondary | ICD-10-CM

## 2012-06-13 DIAGNOSIS — L919 Hypertrophic disorder of the skin, unspecified: Secondary | ICD-10-CM

## 2012-06-13 DIAGNOSIS — R142 Eructation: Secondary | ICD-10-CM

## 2012-06-13 LAB — HEMOGLOBIN A1C: Hgb A1c MFr Bld: 7.3 % — ABNORMAL HIGH (ref <5.7)

## 2012-06-13 NOTE — Progress Notes (Signed)
Patient ID: Jasmine [name], female   DOB: [birth year], 46 y.o.   MRN: [id number] Presents for ultrasound. At annual exam was having mid abdominal/epigastric-type pain that is cyclic, increases before cycle and is resolved after cycle.. Denies nausea, vomiting, constipation, heartburn or fever. States no pain today, last week a 5. Random glucose was 122, history of elevated blood sugars in the past.  Ultrasound: Anteverted uterus with normal echo pattern. Right ovary seen transabdominal normal. Left ovary thickwalled cystic filled lumen 24 x 17 x 19 mm/consistent with corpus luteal cyst. Negative cul-de-sac.  Elevated blood sugar/abdominal obesity Mid abdominal/epigastric cyclic pain  Plan: Reviewed ultrasound shows corpus luteal normal-type cyst. Reviewed importance of abdominal weight loss. Will check a fasting glucose and hemoglobin A1c refer as needed. If continued upper abdominal pain will have evaluated by primary care.

## 2012-06-14 LAB — GLUCOSE, RANDOM: Glucose, Bld: 123 mg/dL — ABNORMAL HIGH (ref 70–99)

## 2012-06-16 ENCOUNTER — Telehealth: Payer: Self-pay | Admitting: *Deleted

## 2012-06-16 DIAGNOSIS — R7309 Other abnormal glucose: Secondary | ICD-10-CM

## 2012-06-16 NOTE — Telephone Encounter (Signed)
Office notes faxed to Dr.Balan office they will fax me appointment time and date.

## 2012-06-16 NOTE — Telephone Encounter (Signed)
Message copied by Aura Camps on Thu Jun 16, 2012  3:11 PM ------      Message from: Keenan Bachelor      Created: Thu Jun 16, 2012  2:41 PM      Regarding: Needs referral to Dr. Talmage Nap office       Please call patient and inform hemoglobin A1c is elevated/diabetes range, average blood sugars 163 which is too high. Please schedule appointment for her with Dr. Talmage Nap for her if she has a primary care.             I spoke with patient about [place]'s recommendation above.  She does not have Primary Care.  I told her you would arrange and call her.            Thanks Agent Ted Mcalpine!!!

## 2012-06-17 ENCOUNTER — Other Ambulatory Visit: Payer: 59

## 2012-06-21 ENCOUNTER — Encounter: Payer: Self-pay | Admitting: Women's Health

## 2012-06-23 NOTE — Telephone Encounter (Signed)
Appointment 07/27/12 @ 10:00 left message for pt to call.

## 2012-06-23 NOTE — Telephone Encounter (Signed)
Pt informed with the below note. 

## 2013-01-12 ENCOUNTER — Telehealth: Payer: Self-pay

## 2013-01-12 NOTE — Telephone Encounter (Signed)
Patient called today asking about referral to doctor that [place] rec after Oct visit. I asked her was she referring to Dr. Talmage Nap. She was and said she never did go and see her because the appt never worked with her schedule.  She now has some hotflashes, nightsweats,  irritability and no period in 4 mos.  I explained to her that those symptoms are most likely related to peri-menopause/menopause and not her diabetes.  She did not seem to understand that she had diabetes.  I did explain to her that the labs were at a diabetic level previously and that is why [place] had recommended she go to see Dr. Talmage Nap or her primary care.  She does not want to make two visits one to [place] for menopausal sx and one to Dr. Talmage Nap. She decided that she would like to call her primary care MD at [facility] and go and see if they can help her with both issues.  I stressed the importance of following up regarding her diabetes.  Results were mailed to patient so she can take them to her PCP.

## 2013-01-24 ENCOUNTER — Emergency Department (HOSPITAL_COMMUNITY)
Admission: EM | Admit: 2013-01-24 | Discharge: 2013-01-24 | Disposition: A | Discharge status: Home / Self Care | Payer: 59 | Attending: Emergency Medicine | Admitting: Emergency Medicine

## 2013-01-24 ENCOUNTER — Encounter (HOSPITAL_COMMUNITY): Payer: Self-pay | Admitting: Emergency Medicine

## 2013-01-24 DIAGNOSIS — L0291 Cutaneous abscess, unspecified: Secondary | ICD-10-CM

## 2013-01-24 DIAGNOSIS — L02419 Cutaneous abscess of limb, unspecified: Secondary | ICD-10-CM | POA: Insufficient documentation

## 2013-01-24 DIAGNOSIS — F17200 Nicotine dependence, unspecified, uncomplicated: Secondary | ICD-10-CM | POA: Insufficient documentation

## 2013-01-24 DIAGNOSIS — E7800 Pure hypercholesterolemia, unspecified: Secondary | ICD-10-CM | POA: Insufficient documentation

## 2013-01-24 DIAGNOSIS — E119 Type 2 diabetes mellitus without complications: Secondary | ICD-10-CM | POA: Insufficient documentation

## 2013-01-24 DIAGNOSIS — Z79899 Other long term (current) drug therapy: Secondary | ICD-10-CM | POA: Insufficient documentation

## 2013-01-24 DIAGNOSIS — L03119 Cellulitis of unspecified part of limb: Secondary | ICD-10-CM | POA: Insufficient documentation

## 2013-01-24 HISTORY — DX: Pure hypercholesterolemia, unspecified: E78.00

## 2013-01-24 MED ORDER — SULFAMETHOXAZOLE-TRIMETHOPRIM 800-160 MG PO TABS: 2.0000 | ORAL_TABLET | Freq: Two times a day (BID) | ORAL | Status: DC

## 2013-01-24 MED ORDER — HYDROCODONE-ACETAMINOPHEN 5-325 MG PO TABS: 1.0000 | ORAL_TABLET | Freq: Four times a day (QID) | ORAL | Status: DC | PRN

## 2013-01-24 NOTE — ED Notes (Signed)
Pt states she has had abscess on R inner thigh for almost a week.  Pt states she has some drainage from abscess.

## 2013-01-24 NOTE — ED Provider Notes (Signed)
Medical screening examination/treatment/procedure(s) were performed by non-physician practitioner and as supervising physician I was immediately available for consultation/collaboration.  Caeson Filippi, MD 01/24/13 1612 

## 2013-01-24 NOTE — ED Provider Notes (Signed)
History     CSN: [id number]  Arrival date & time 01/24/13  0917   First MD Initiated Contact with Patient 01/24/13 1003      Chief Complaint  Patient presents with  . Abscess    (Consider location/radiation/quality/duration/timing/severity/associated sxs/prior treatment) HPI Comments: Patient with a history of DM and skin abscesses presents today with a skin abscess of the right posterior thigh.  Abscess has been present for the past week and is gradually worsening.  She reports that earlier today the abscess began to break open and drain a small amount of purulent fluid.  She has been applying warm compresses and also been placing a penny with bacon on the area.  She has been taking Ibuprofen for the pain, which gives her mild relief.  She denies fever or chills.  Denies nausea or vomiting.  Patient is a 47 y.o. female presenting with abscess. The history is provided by the patient.  Abscess Associated symptoms: no fever     Past Medical History  Diagnosis Date  . Diabetes mellitus without complication   . Hypercholesteremia     History reviewed. No pertinent past surgical history.  Family History  Problem Relation Age of Onset  . Diabetes Mother   . Diabetes Father   . Cancer Father     Prostate cancer  . Cancer Sister     Cervical cancer  . Diabetes Brother     History  Substance Use Topics  . Smoking status: Current Every Day Smoker  . Smokeless tobacco: Never Used  . Alcohol Use: No    OB History   Grav Para Term Preterm Abortions TAB SAB Ect Mult Living   1 1 1       1       Review of Systems  Constitutional: Negative for fever and chills.  Skin:       abscess  All other systems reviewed and are negative.    Allergies  Eggs or egg-derived products  Home Medications   Current Outpatient Rx  Name  Route  Sig  Dispense  Refill  . atorvastatin (LIPITOR) 10 MG tablet   Oral   Take 10 mg by mouth daily.         . Cetirizine HCl (ZYRTEC PO)  Oral   Take by mouth.           . metFORMIN (GLUCOPHAGE) 500 MG tablet   Oral   Take 500 mg by mouth 2 (two) times daily with a meal.         . MULTIPLE VITAMIN PO   Oral   Take by mouth.           BP 138/82  Pulse 97  Temp(Src) 97.9 F (36.6 C) (Oral)  Resp 16  SpO2 96%  Physical Exam  Nursing note and vitals reviewed. Constitutional: She appears well-developed and well-nourished.  HENT:  Head: Normocephalic and atraumatic.  Neck: Normal range of motion. Neck supple.  Cardiovascular: Normal rate, regular rhythm and normal heart sounds.   Pulmonary/Chest: Effort normal and breath sounds normal.  Neurological: She is alert.  Skin: Skin is warm and dry.  2 cm abscess of the right medial posterior thigh with surrounding induration.  Psychiatric: She has a normal mood and affect.    ED Course  Procedures (including critical care time)  Labs Reviewed - No data to display No results found.   No diagnosis found.  INCISION AND DRAINAGE Performed by: Anne Shutter, Eh Sauseda Consent: Verbal consent obtained. Risks  and benefits: risks, benefits and alternatives were discussed Type: abscess  Body area: right posterior thigh  Anesthesia: local infiltration  Incision was made with a scalpel.  Local anesthetic: lidocaine 2% with epinephrine  Anesthetic total: 3 ml  Complexity: complex Blunt dissection to break up loculations  Drainage: purulent  Drainage amount: large  Patient tolerance: Patient tolerated the procedure well with no immediate complications.    MDM  Patient with skin abscess amenable to incision and drainage.  Abscess was not large enough to warrant packing or drain,  wound recheck in 2 days. Encouraged home warm compresses.  Mild signs of cellulitis is surrounding skin.  Will d/c to home.  Patient given prescription for Bactrim DS and Norco.        Pascal Lux [place], PA-C 01/24/13 [address] [place], [place] 01/24/13 1119

## 2013-06-07 ENCOUNTER — Other Ambulatory Visit (HOSPITAL_COMMUNITY)
Admission: RE | Admit: 2013-06-07 | Discharge: 2013-06-07 | Disposition: A | Discharge status: Home / Self Care | Payer: 59 | Source: Ambulatory Visit | Attending: Gynecology | Admitting: Gynecology

## 2013-06-07 ENCOUNTER — Encounter: Payer: Self-pay | Admitting: Women's Health

## 2013-06-07 ENCOUNTER — Ambulatory Visit (INDEPENDENT_AMBULATORY_CARE_PROVIDER_SITE_OTHER): Payer: 59 | Admitting: Women's Health

## 2013-06-07 VITALS — BP 130/80 | Ht 62.0 in | Wt 242.0 lb

## 2013-06-07 DIAGNOSIS — E7800 Pure hypercholesterolemia, unspecified: Secondary | ICD-10-CM

## 2013-06-07 DIAGNOSIS — Z01419 Encounter for gynecological examination (general) (routine) without abnormal findings: Secondary | ICD-10-CM | POA: Insufficient documentation

## 2013-06-07 DIAGNOSIS — Z113 Encounter for screening for infections with a predominantly sexual mode of transmission: Secondary | ICD-10-CM

## 2013-06-07 DIAGNOSIS — E119 Type 2 diabetes mellitus without complications: Secondary | ICD-10-CM

## 2013-06-07 NOTE — Progress Notes (Signed)
Jasmine [name] [birth year] [id number]    History:    The patient presents for annual exam.  Last year regular monthly cycle that has become more irregular this year,cycle in February, July and 2 weeks ago. Had been separated from her husband and is now back with him. History of normal Paps and mammograms. Diagnosed with diabetes this past year and currently on insulin, Lipitor and lisinipril. per primary care. Has lost 40 pounds this past year with diet and exercise.  Past medical history, past surgical history, family history and social history were all reviewed and documented in the EPIC chart. Works at [employer]. Daughter [name] attending [employer] and expecting first baby in December. Both parents diabetic and hypertensive.   ROS:  A  ROS was performed and pertinent positives and negatives are included in the history.  Exam:  Filed Vitals:   06/07/13 0759  BP: 130/80    General appearance:  Normal Head/Neck:  Normal, without cervical or supraclavicular adenopathy. Thyroid:  Symmetrical, normal in size, without palpable masses or nodularity. Respiratory  Effort:  Normal  Auscultation:  Clear without wheezing or rhonchi Cardiovascular  Auscultation:  Regular rate, without rubs, murmurs or gallops  Edema/varicosities:  Not grossly evident Abdominal  Soft,nontender, without masses, guarding or rebound.  Liver/spleen:  No organomegaly noted  Hernia:  None appreciated  Skin  Inspection:  Grossly normal  Palpation:  Grossly normal Neurologic/psychiatric  Orientation:  Normal with appropriate conversation.  Mood/affect:  Normal  Genitourinary    Breasts: Examined lying and sitting.     Right: Without masses, retractions, discharge or axillary adenopathy.     Left: Without masses, retractions, discharge or axillary adenopathy.   Inguinal/mons:  Normal without inguinal adenopathy  External genitalia:  Normal  BUS/Urethra/Skene's glands:  Normal  Bladder:  Normal  Vagina:   Normal  Cervix:  Normal  Uterus:   normal in size, shape and contour.  Midline and mobile  Adnexa/parametria:     Rt: Without masses or tenderness.   Lt: Without masses or tenderness.  Anus and perineum: Normal  Digital rectal exam: Normal sphincter tone without palpated masses or tenderness  Assessment/Plan:  47 y.o. MBF G1P1 for annual exam with irregular cycles.  Diabetes on insulin/hypertension/hypercholesterolemia-primary care labs and meds STD screening Contraception management Obesity  Plan: Congratulated on weight loss and healthy lifestyle instructed to continue. SBE's, continue annual mammogram, calcium rich diet, vitamin D 1000 daily and regular exercise encouraged. Tdap.vaccine given expecting first grandchild. GC/Chlamydia, Pap. Pap normal 2012, new screening guidelines reviewed. Declines HIV, hepatitis or RPR. Contraception options reviewed, Mirena IUD information given reviewed slight risk for infection, perforation, and hemorrhage. Will check coverage, have placed now per  Dr. Lily Peer.    Harrington Challenger [facility], 8:43 AM 06/07/2013

## 2013-06-07 NOTE — Patient Instructions (Signed)
Tdap vaccine  1500 Calorie Diabetic Diet The 1500 calorie diabetic diet limits calories to 1500 each day. Following this diet and making healthy meal choices can help improve overall health. It controls blood glucose (sugar) levels and can also help lower blood pressure and cholesterol.  SERVING SIZES Measuring foods and serving sizes helps to make sure you are getting the right amount of food. The list below tells how big or small some common serving sizes are.  1 oz.........4 stacked dice.  3 oz........Marland KitchenDeck of cards.  1 tsp.......Marland KitchenTip of little finger.  1 tbs......Marland KitchenMarland KitchenThumb.  2 tbs.......Marland KitchenGolf ball.   cup......Marland KitchenHalf of a fist.  1 cup.......Marland KitchenA fist. GUIDELINES FOR CHOOSING FOODS The goal of this diet is to eat a variety of foods and limit calories to 1500 each day. This can be done by choosing foods that are low in calories and fat. The diet also suggests eating small amounts of food frequently. Doing this helps control your blood glucose levels, so they do not get too high or too low. Each meal or snack may include a protein food source to help you feel more satisfied. Try to eat about the same amount of food around the same time each day. This includes weekend days, travel days, and days off work. Space your meals about 4 to 5 hours apart, and add a snack between them, if you wish.  For example, a daily food plan could include breakfast, a morning snack, lunch, dinner, and an evening snack. Healthy meals and snacks have different types of foods, including whole grains, vegetables, fruits, lean meats, poultry, fish, and dairy products. As you plan your meals, select a variety of foods. Choose from the bread and starch, vegetable, fruit, dairy, and meat/protein groups. Examples of foods from each group are listed below, with their suggested serving sizes. Use measuring cups and spoons to become familiar with what a healthy portion looks like. Bread and Starch Each serving equals 15 grams of  carbohydrate.  1 slice bread.   bagel.   cup cold cereal (unsweetened).   cup hot cereal or mashed potatoes.  1 small potato (size of a computer mouse).   cup cooked pasta or rice.   English muffin.  1 cup broth-based soup.  3 cups of popcorn.  4 to 6 whole-wheat crackers.   cup cooked beans, peas, or corn. Vegetables Each serving equals 5 grams of carbohydrate.   cup cooked vegetables.  1 cup raw vegetables.   cup tomato or vegetable juice. Fruit Each serving equals 15 grams of carbohydrate.  1 small apple or orange.  1  cup watermelon or strawberries.   cup applesauce (no sugar added).  2 tbs raisins.   banana.   cup canned fruit, packed in water or in its own juice.   cup unsweetened fruit juice. Dairy Each serving equals 12 to 15 grams of carbohydrate.  1 cup fat-free milk.  6 oz artificially sweetened yogurt or plain yogurt.  1 cup low-fat buttermilk.  1 cup soy milk.  1 cup almond milk. Meat/Protein  1 large egg.  2 to 3 oz meat, poultry, or fish.   cup low-fat cottage cheese.  1 tbs peanut butter.  1 oz low-fat cheese.   cup tuna, packed in water.   cup tofu. Fat  1 tsp oil.  1 tsp trans-fat-free margarine.  1 tsp butter.  1 tsp mayonnaise.  2 tbs avocado.  1 tbs salad dressing.  1 tbs cream cheese.  2 tbs sour cream. SAMPLE  1500 CALORIE DIET PLAN Breakfast   whole-wheat English muffin (1 carb serving).  1 tsp trans-fat-free margarine.  1 scrambled egg.  1 cup fat-free milk (1 carb serving).  1 small orange (1 carb serving). Lunch  Chicken wrap.  1 whole-wheat tortilla, 8-inch (1 carb servings).  2 oz chicken breast, sliced.  2 tbs low-fat salad dressing, such as [place].   cup shredded lettuce.  2 slices tomato.   cup carrot sticks.  1 small apple (1 carb serving). Afternoon Snack  3 graham cracker squares (1 carb serving).  1 tbs peanut butter. Dinner  2 oz lean  pork chop, broiled.  1 cup brown rice (3 carb servings).   cup steamed carrots.   cup green beans.  1 cup fat-free milk (1 carb serving).  1 tsp trans-fat-free margarine. Evening Snack   cup low-fat cottage cheese.  1 small peach or pear, sliced (or  cup canned in water) (1 carb serving). MEAL PLAN You can use this worksheet to help you make a daily meal plan based on the 1500 calorie diabetic diet suggestions. If you are using this plan to help you control your blood glucose, you may interchange carbohydrate containing foods (dairy, starches, and fruits). Select a variety of fresh foods of varying colors and flavors. The total amount of carbohydrate in your meals or snacks is more important than making sure you include all of the food groups every time you eat. You can choose from approximately this many of the following foods to build your day's meals:  6 Starches.  3 Vegetables.  2 Fruits.  2 Dairy.  4 to 6 oz Meat/Protein.  Up to 3 Fats. Your dietician can use this worksheet to help you decide how many servings and which types of foods are right for you. BREAKFAST Food Group and Servings / Food Choice Starch _________________________________________________________ Dairy __________________________________________________________ Fruit ___________________________________________________________ Meat/Protein____________________________________________________ Fat ____________________________________________________________ LUNCH Food Group and Servings / Food Choice  Starch _________________________________________________________ Meat/Protein ___________________________________________________ Vegetables _____________________________________________________ Fruit __________________________________________________________ Dairy __________________________________________________________ Fat ____________________________________________________________ Aura Fey Food Group and Servings / Food Choice Dairy __________________________________________________________ Starch _________________________________________________________ Meat/Protein____________________________________________________ Zada Girt ___________________________________________________________ Laural Golden Food Group and Servings / Food Choice Starch _________________________________________________________ Meat/Protein ___________________________________________________ Dairy __________________________________________________________ Vegetable ______________________________________________________ Fruit ___________________________________________________________ Fat ____________________________________________________________ Lollie Sails Food Group and Servings / Food Choice Fruit ___________________________________________________________ Meat/Protein ____________________________________________________ Dairy __________________________________________________________ Starch __________________________________________________________ DAILY TOTALS Starches _________________________ Vegetables _______________________ Fruits ____________________________ Dairy ____________________________ Meat/Protein_____________________ Fats _____________________________ Document Released: 02/16/2005 Document Revised: 10/19/2011 Document Reviewed: 06/13/2009 [employer] Patient Information 2014 [employer]

## 2013-06-07 NOTE — Addendum Note (Signed)
Addended by: Richardson Chiquito on: 06/07/2013 03:54 PM   Modules accepted: Orders

## 2013-06-08 LAB — GC/CHLAMYDIA PROBE AMP: GC Probe RNA: NEGATIVE

## 2013-06-08 LAB — URINALYSIS W MICROSCOPIC + REFLEX CULTURE
Glucose, UA: NEGATIVE mg/dL
Hgb urine dipstick: NEGATIVE
Leukocytes, UA: NEGATIVE
Nitrite: NEGATIVE
Protein, ur: NEGATIVE mg/dL
Squamous Epithelial / LPF: NONE SEEN
pH: 5.5 (ref 5.0–8.0)

## 2013-06-14 ENCOUNTER — Ambulatory Visit: Payer: 59 | Admitting: *Deleted

## 2013-09-07 ENCOUNTER — Encounter: Payer: Self-pay | Admitting: Women's Health

## 2013-09-07 ENCOUNTER — Telehealth: Payer: Self-pay | Admitting: *Deleted

## 2013-09-07 NOTE — Telephone Encounter (Signed)
Pt called requesting lab results, I left message for her to return my call.

## 2013-09-08 ENCOUNTER — Telehealth: Payer: Self-pay | Admitting: Gynecology

## 2013-09-08 ENCOUNTER — Other Ambulatory Visit: Payer: Self-pay | Admitting: Gynecology

## 2013-09-08 MED ORDER — LEVONORGESTREL 20 MCG/24HR IU IUD: INTRAUTERINE_SYSTEM | Freq: Once | INTRAUTERINE | Status: DC

## 2013-09-08 NOTE — Telephone Encounter (Signed)
09/08/13-LM VM for pt that her [employer] ins covers the Mirena & insertion at 100%,no copay. Told to call first day of cycle to schedule with JF/wl

## 2013-09-08 NOTE — Telephone Encounter (Signed)
Pt informed with normal lab result on 06/07/13 OV

## 2013-11-27 ENCOUNTER — Encounter: Payer: Self-pay | Admitting: Family Medicine

## 2013-11-27 ENCOUNTER — Ambulatory Visit (INDEPENDENT_AMBULATORY_CARE_PROVIDER_SITE_OTHER): Payer: 59

## 2013-11-27 ENCOUNTER — Ambulatory Visit (INDEPENDENT_AMBULATORY_CARE_PROVIDER_SITE_OTHER): Payer: 59 | Admitting: Family Medicine

## 2013-11-27 VITALS — BP 119/89 | HR 78 | Ht 62.5 in | Wt 261.0 lb

## 2013-11-27 DIAGNOSIS — M170 Bilateral primary osteoarthritis of knee: Secondary | ICD-10-CM | POA: Insufficient documentation

## 2013-11-27 DIAGNOSIS — E119 Type 2 diabetes mellitus without complications: Secondary | ICD-10-CM

## 2013-11-27 DIAGNOSIS — M1710 Unilateral primary osteoarthritis, unspecified knee: Secondary | ICD-10-CM

## 2013-11-27 DIAGNOSIS — Z79899 Other long term (current) drug therapy: Secondary | ICD-10-CM

## 2013-11-27 DIAGNOSIS — IMO0002 Reserved for concepts with insufficient information to code with codable children: Secondary | ICD-10-CM

## 2013-11-27 DIAGNOSIS — M25569 Pain in unspecified knee: Secondary | ICD-10-CM

## 2013-11-27 DIAGNOSIS — M1712 Unilateral primary osteoarthritis, left knee: Secondary | ICD-10-CM

## 2013-11-27 DIAGNOSIS — E785 Hyperlipidemia, unspecified: Secondary | ICD-10-CM | POA: Insufficient documentation

## 2013-11-27 DIAGNOSIS — Z5181 Encounter for therapeutic drug level monitoring: Secondary | ICD-10-CM

## 2013-11-27 MED ORDER — SITAGLIPTIN PHOSPHATE 100 MG PO TABS: 100.0000 mg | ORAL_TABLET | Freq: Every day | ORAL | Status: DC

## 2013-11-27 NOTE — Progress Notes (Signed)
CC: Jasmine CardYolanda E [name] is a 48 y.o. female is here for Establish Care   Subjective: HPI:  Pleasant 48 year old here to establish care  Complains of chronic left knee pain that has been present for half a decade at least. It is described as stiffness and pain throughout the entire knee however localized medially. It is improved with tramadol or with rotating the tibia in external rotation. Pain becomes severe with drop of air mattress pressure and usually is accompanied by moderate swelling.  Pain is also worse with sitting for long periods of time or climbing stairs. Pain is slightly improved with occasional tramadol use and always resolves after days of rest. Pain is bad enough to where it is not uncommon for her to take 6 days off from work every month. She reports that it does occasionally give way but denies locking or catching. Denies any recent or remote trauma to the knee. Denies joint pain elsewhere. Overall pain seems to be worsening on a monthly basis  Reports a history of type 2 diabetes diagnosed last summer. She's tried metformin however this caused a rash. She currently takes 30 units of Lantus every evening and 5 units of NovoLog with every meal. She reports fasting blood sugars are consistently below 120 and postprandials are always in the low 100s. No hypoglycemic episodes. She has never tried any other medications for diabetes. She expresses frustration that she's having difficulty losing weight ever since starting insulin. She is on a angiotensin receptor blocker and a statin. Denies polyuria polyphasia or polydipsia. She states she's never had an A1c at 8 or above.  History of hyperlipidemia she's unsure when cholesterol was checked last however is taking Zocor on a daily basis without myalgias or right upper quadrant pain  Review of Systems - General ROS: negative for - chills, fever, night sweats, weight gain Ophthalmic ROS: negative for - decreased vision Psychological ROS:  negative for - active anxiety or depression ENT ROS: negative for - hearing change, nasal congestion, tinnitus or allergies Hematological and Lymphatic ROS: negative for - bleeding problems, bruising or swollen lymph nodes Breast ROS: negative Respiratory ROS: no cough, shortness of breath, or wheezing Cardiovascular ROS: no chest pain or dyspnea on exertion Gastrointestinal ROS: no abdominal pain, change in bowel habits, or black or bloody stools Genito-Urinary ROS: negative for - genital discharge, genital ulcers, incontinence or abnormal bleeding from genitals Musculoskeletal ROS: negative for - joint pain or muscle pain other than that described above Neurological ROS: negative for - headaches or memory loss Dermatological ROS: negative for lumps, mole changes, rash and skin lesion changes  Past Medical History  Diagnosis Date  . Diabetes mellitus without complication   . Hypercholesteremia   . Hypertension     No past surgical history on file. Family History  Problem Relation Age of Onset  . Diabetes Mother   . Diabetes Father   . Cancer Father     Prostate cancer  . Cancer Sister     Cervical cancer  . Diabetes Brother     History   Social History  . Marital Status: Married    Spouse Name: N/A    Number of Children: N/A  . Years of Education: N/A   Occupational History  . Not on file.   Social History Main Topics  . Smoking status: Current Every Day Smoker    Types: Cigarettes  . Smokeless tobacco: Never Used  . Alcohol Use: No  . Drug Use: No  . Sexual  Activity: Yes    Birth Control/ Protection: None   Other Topics Concern  . Not on file   Social History Narrative  . No narrative on file     Objective: BP 119/89  Pulse 78  Ht 5' 2.5" (1.588 m)  Wt 261 lb (118.389 kg)  BMI 46.95 kg/m2  General: Alert and Oriented, No Acute Distress HEENT: Pupils equal, round, reactive to light. Conjunctivae clear.  Moist mucous membranes pharynx  unremarkable Lungs: Clear to auscultation bilaterally, no wheezing/ronchi/rales.  Comfortable work of breathing. Good air movement. Cardiac: Regular rate and rhythm. Normal S1/S2.  No murmurs, rubs, nor gallops.   Abdomen: Obese and soft Extremities: No peripheral edema.  Strong peripheral pulses. Left knee exam shows full-strength and range of motion. There is no swelling, redness, nor warmth overlying the knee.  No patellar crepitus. No patellar apprehension. No pain with palpation of the inferior patellar pole.  No pain or laxity with valgus nor varus stress. Anterior drawer is negative. Unable to perform McMurray's test due to pain. No popliteal space tenderness or palpable mass. Palpation of the medial joint line reproduces presenting pain.  Mental Status: No depression, anxiety, nor agitation. Skin: Warm and dry.  Assessment & Plan: Jasmine LagerYolanda was seen today for establish care.  Diagnoses and associated orders for this visit:  Hyperlipidemia - Lipid panel  Encounter for monitoring statin therapy - COMPLETE METABOLIC PANEL WITH GFR  Type 2 diabetes mellitus - Hemoglobin A1c - COMPLETE METABOLIC PANEL WITH GFR - sitaGLIPtin (JANUVIA) 100 MG tablet; Take 1 tablet (100 mg total) by mouth daily.  Degenerative arthritis of left knee - DG Knee Complete 4 Views Left; Future    Hyperlipidemia: Check and lipid panel continue  Zocor pending results, checking liver enzymes Type 2 diabetes: Controlled however suspect insulin is significantly contributing to her difficulty losing weight. We will stop mealtime insulin and add Januvia she was given 2 weeks worth of samples and a savings [name]. Checking A1c Degenerative arthritis left knee: Uncontrolled chronic condition, reviewed x-rays from 2012 confirming degenerative changes. I like to get a up-to-date x-ray today and we'll refer her to Dr. Karie Schwalbe. in sports medicine for consideration of steroid injection.  Encouraged ibuprofen as these for pain  pending x-ray results.  45 minutes spent face-to-face during visit today of which at least 50% was counseling or coordinating care regarding: 1. Hyperlipidemia   2. Encounter for monitoring statin therapy   3. Type 2 diabetes mellitus   4. Degenerative arthritis of left knee       Return in about 1 week (around 12/04/2013) for Dr T (sports medicine) Evaluation.

## 2013-11-28 ENCOUNTER — Encounter: Payer: Self-pay | Admitting: Family Medicine

## 2013-11-28 DIAGNOSIS — N182 Chronic kidney disease, stage 2 (mild): Secondary | ICD-10-CM | POA: Insufficient documentation

## 2013-11-28 LAB — LIPID PANEL
CHOL/HDL RATIO: 3.6 ratio
Cholesterol: 172 mg/dL (ref 0–200)
HDL: 48 mg/dL (ref >39)
LDL Cholesterol: 91 mg/dL (ref 0–99)
Triglycerides: 167 mg/dL — ABNORMAL HIGH (ref <150)
VLDL: 33 mg/dL (ref 0–40)

## 2013-11-28 LAB — HEMOGLOBIN A1C
Hgb A1c MFr Bld: 6.6 % — ABNORMAL HIGH (ref <5.7)
MEAN PLASMA GLUCOSE: 143 mg/dL — AB (ref <117)

## 2013-11-28 LAB — COMPLETE METABOLIC PANEL WITH GFR
ALBUMIN: 4.4 g/dL (ref 3.5–5.2)
ALT: 12 U/L (ref 0–35)
AST: 13 U/L (ref 0–37)
Alkaline Phosphatase: 55 U/L (ref 39–117)
BUN: 10 mg/dL (ref 6–23)
CO2: 23 meq/L (ref 19–32)
Calcium: 9.6 mg/dL (ref 8.4–10.5)
Chloride: 106 mEq/L (ref 96–112)
Creat: 1.13 mg/dL — ABNORMAL HIGH (ref 0.50–1.10)
GFR, EST AFRICAN AMERICAN: 67 mL/min
GFR, Est Non African American: 58 mL/min — ABNORMAL LOW
GLUCOSE: 96 mg/dL (ref 70–99)
POTASSIUM: 4.6 meq/L (ref 3.5–5.3)
SODIUM: 137 meq/L (ref 135–145)
TOTAL PROTEIN: 7.2 g/dL (ref 6.0–8.3)
Total Bilirubin: 0.3 mg/dL (ref 0.2–1.2)

## 2013-12-06 ENCOUNTER — Ambulatory Visit: Payer: 59 | Admitting: Family Medicine

## 2013-12-08 ENCOUNTER — Ambulatory Visit (INDEPENDENT_AMBULATORY_CARE_PROVIDER_SITE_OTHER): Payer: 59 | Admitting: Sports Medicine

## 2013-12-08 ENCOUNTER — Encounter: Payer: Self-pay | Admitting: Sports Medicine

## 2013-12-08 VITALS — BP 124/66 | HR 77 | Ht 63.0 in | Wt 262.0 lb

## 2013-12-08 DIAGNOSIS — M1710 Unilateral primary osteoarthritis, unspecified knee: Secondary | ICD-10-CM

## 2013-12-08 DIAGNOSIS — M1712 Unilateral primary osteoarthritis, left knee: Secondary | ICD-10-CM

## 2013-12-08 DIAGNOSIS — IMO0002 Reserved for concepts with insufficient information to code with codable children: Secondary | ICD-10-CM

## 2013-12-08 MED ORDER — MELOXICAM 15 MG PO TABS: ORAL_TABLET | ORAL | Status: DC

## 2013-12-08 NOTE — Assessment & Plan Note (Signed)
Mobic with tramadol for breakthrough pain. Consider mild renal insufficiency we'll probably check a creatinine level at the next visit. Formal physical therapy. Injection today. Return in one month.

## 2013-12-08 NOTE — Progress Notes (Signed)
   Subjective:    I'm seeing this patient as a consultation for:  Dr. Ivan AnchorsHommel  CC: Left knee pain  HPI: This is a very pleasant 48 year old female with a long history of left knee pain localized predominately on the anterior aspect of the medial joint line. Is worse in the mornings, worse with weightbearing, occasional mechanical symptoms. Has tried oral analgesics without much success. Has never had injections, physical therapy. She does have chronic renal insufficiency so NSAIDs have been avoided at this point. It is moderate, persistent.  Past medical history, Surgical history, Family history not pertinant except as noted below, Social history, Allergies, and medications have been entered into the medical record, reviewed, and no changes needed.   Review of Systems: No headache, visual changes, nausea, vomiting, diarrhea, constipation, dizziness, abdominal pain, skin rash, fevers, chills, night sweats, weight loss, swollen lymph nodes, body aches, joint swelling, muscle aches, chest pain, shortness of breath, mood changes, visual or auditory hallucinations.   Objective:   General: Well Developed, well nourished, and in no acute distress.  Neuro/Psych: Alert and oriented x3, extra-ocular muscles intact, able to move all 4 extremities, sensation grossly intact. Skin: Warm and dry, no rashes noted.  Respiratory: Not using accessory muscles, speaking in full sentences, trachea midline.  Cardiovascular: Pulses palpable, no extremity edema. Abdomen: Does not appear distended. Left Knee: Normal to inspection with no erythema or effusion or obvious bony abnormalities. Tender to palpation at the joint lines. ROM full in flexion and extension and lower leg rotation. Ligaments with solid consistent endpoints including ACL, PCL, LCL, MCL. Negative Mcmurray's, Apley's, and Thessalonian tests. Non painful patellar compression. Patellar glide without crepitus. Patellar and quadriceps tendons  unremarkable. Hamstring and quadriceps strength is normal.   Procedure: Real-time Ultrasound Guided Injection of left knee Device: GE Logiq E  Verbal informed consent obtained.  Time-out conducted.  Noted no overlying erythema, induration, or other signs of local infection.  Skin prepped in a sterile fashion.  Local anesthesia: Topical Ethyl chloride.  With sterile technique and under real time ultrasound guidance:  2 cc Kenalog 40, 4 cc lidocaine injected easily into the suprapatellar recess. Completed without difficulty  Pain immediately resolved suggesting accurate placement of the medication.  Advised to call if fevers/chills, erythema, induration, drainage, or persistent bleeding.  Images permanently stored and available for review in the ultrasound unit.  Impression: Technically successful ultrasound guided injection.  Impression and Recommendations:   This case required medical decision making of moderate complexity.

## 2013-12-11 ENCOUNTER — Other Ambulatory Visit: Payer: Self-pay | Admitting: *Deleted

## 2013-12-11 MED ORDER — LOSARTAN POTASSIUM 25 MG PO TABS: 25.0000 mg | ORAL_TABLET | Freq: Every day | ORAL | Status: DC

## 2013-12-13 ENCOUNTER — Ambulatory Visit (INDEPENDENT_AMBULATORY_CARE_PROVIDER_SITE_OTHER): Payer: 59

## 2013-12-13 DIAGNOSIS — M25569 Pain in unspecified knee: Secondary | ICD-10-CM

## 2013-12-13 DIAGNOSIS — M1710 Unilateral primary osteoarthritis, unspecified knee: Secondary | ICD-10-CM

## 2013-12-13 DIAGNOSIS — R269 Unspecified abnormalities of gait and mobility: Secondary | ICD-10-CM

## 2013-12-13 DIAGNOSIS — M25669 Stiffness of unspecified knee, not elsewhere classified: Secondary | ICD-10-CM

## 2013-12-13 DIAGNOSIS — M6281 Muscle weakness (generalized): Secondary | ICD-10-CM

## 2013-12-13 DIAGNOSIS — R609 Edema, unspecified: Secondary | ICD-10-CM

## 2013-12-13 DIAGNOSIS — IMO0002 Reserved for concepts with insufficient information to code with codable children: Secondary | ICD-10-CM

## 2013-12-15 ENCOUNTER — Telehealth: Payer: Self-pay | Admitting: *Deleted

## 2013-12-15 ENCOUNTER — Other Ambulatory Visit: Payer: Self-pay | Admitting: Family Medicine

## 2013-12-15 MED ORDER — SERTRALINE HCL 50 MG PO TABS
50.0000 mg | ORAL_TABLET | Freq: Every day | ORAL | Status: DC
Start: 2013-12-15 — End: 2014-05-10

## 2013-12-15 NOTE — Telephone Encounter (Signed)
Pt called and per her insurance she requests a 90 day supply of zoloft sent to her pharm

## 2014-01-09 ENCOUNTER — Ambulatory Visit: Payer: 59 | Admitting: Sports Medicine

## 2014-02-02 ENCOUNTER — Encounter: Payer: Self-pay | Admitting: Family Medicine

## 2014-02-02 ENCOUNTER — Ambulatory Visit (INDEPENDENT_AMBULATORY_CARE_PROVIDER_SITE_OTHER): Payer: 59 | Admitting: Family Medicine

## 2014-02-02 VITALS — BP 134/76 | HR 96 | Temp 97.6°F | Wt 265.0 lb

## 2014-02-02 DIAGNOSIS — K429 Umbilical hernia without obstruction or gangrene: Secondary | ICD-10-CM

## 2014-02-02 DIAGNOSIS — E669 Obesity, unspecified: Secondary | ICD-10-CM

## 2014-02-02 MED ORDER — PHENTERMINE HCL 37.5 MG PO CAPS: 37.5000 mg | ORAL_CAPSULE | ORAL | Status: DC

## 2014-02-02 NOTE — Progress Notes (Signed)
CC: Jasmine [name] is a 48 y.o. female is here for abd pain x 2-3 years   Subjective: HPI:  Patient complains of abdominal pain that has been present on daily basis for the past 2-3 years. Is described as mild to moderate in severity but she's had one episode where it was severe to the point where it incapacitated her for 5 minutes but resolved with lying down and holding her abdomen. She has always attributed this pain to her obesity but wonders if she might have a hernia. She tells me she had an umbilical hernia that was repaired as a small child. Pain is nonradiating, described as a stabbing and pressure sensation, slightly improved with holding the abdomen. Nothing else seems to make it better or worse. She denies nausea, vomiting, diarrhea, blood in stool nor melena. It does not seem to influence her pain.   Reports frustration with obesity and difficulty losing weight. She tries to walk on a daily basis for over 30 minutes.  She states that she sticks to a diet low in fats and carbohydrates trying to stay under 2000 calories a day. Nothing particularly makes obesity better or worse that she can identify. She continues on Lantus and has not had any improvement with weight since starting Januvia and stopping mealtime insulin.   Review Of Systems Outlined In HPI  Past Medical History  Diagnosis Date  . Diabetes mellitus without complication   . Hypercholesteremia   . Hypertension     No past surgical history on file. Family History  Problem Relation Age of Onset  . Diabetes Mother   . Diabetes Father   . Cancer Father     Prostate cancer  . Cancer Sister     Cervical cancer  . Diabetes Brother     History   Social History  . Marital Status: Married    Spouse Name: N/A    Number of Children: N/A  . Years of Education: N/A   Occupational History  . Not on file.   Social History Main Topics  . Smoking status: Current Every Day Smoker    Types: Cigarettes  . Smokeless  tobacco: Never Used  . Alcohol Use: No  . Drug Use: No  . Sexual Activity: Yes    Birth Control/ Protection: None   Other Topics Concern  . Not on file   Social History Narrative  . No narrative on file     Objective: BP 134/76  Pulse 96  Temp(Src) 97.6 F (36.4 C) (Oral)  Wt 265 lb (120.203 kg)  General: Alert and Oriented, No Acute Distress HEENT: Pupils equal, round, reactive to light. Conjunctivae clear.  Moist mucous membranes pharynx unremarkable Lungs: Clear to auscultation bilaterally, no wheezing/ronchi/rales.  Comfortable work of breathing. Good air movement. Cardiac: Regular rate and rhythm. Normal S1/S2.  No murmurs, rubs, nor gallops.   Abdomen: Obese, soft, no rigidity or guarding. Around the umbilicus there is a approximately baseball-sized hernia/mass unfortunately cannot find the exact location of the abdominal wall defect and I'm unsure how large the actual hernia opening is, palpation of this mass reproduces her chronic pain Extremities: No peripheral edema.  Strong peripheral pulses.  Mental Status: No depression, anxiety, nor agitation. Skin: Warm and dry.  Assessment & Plan: Jasmine [name] was seen today for abd pain x 2-3 years.  Diagnoses and associated orders for this visit:  Umbilical hernia without obstruction and without gangrene - Ambulatory referral to General Surgery  Obesity - phentermine 37.5 MG capsule;  Take 1 capsule (37.5 mg total) by mouth every morning.    Obesity: Start phentermine continue diet and exercise interventions Umbilical hernia: Referral to general surgery for consideration of repair. I discussed signs and symptoms of strangulation or incarceration would require emergent followup at a emergency room  Return in about 4 weeks (around 03/02/2014).

## 2014-02-05 ENCOUNTER — Other Ambulatory Visit: Payer: Self-pay | Admitting: *Deleted

## 2014-02-05 DIAGNOSIS — E118 Type 2 diabetes mellitus with unspecified complications: Secondary | ICD-10-CM

## 2014-02-05 MED ORDER — SITAGLIPTIN PHOSPHATE 100 MG PO TABS: 100.0000 mg | ORAL_TABLET | Freq: Every day | ORAL | Status: DC

## 2014-02-05 NOTE — Telephone Encounter (Signed)
Januvia refill sent to [facility]. Corliss SkainsJamie Painter, CMA

## 2014-02-06 ENCOUNTER — Encounter: Payer: Self-pay | Admitting: Women's Health

## 2014-02-08 ENCOUNTER — Ambulatory Visit (INDEPENDENT_AMBULATORY_CARE_PROVIDER_SITE_OTHER): Payer: 59 | Admitting: General Surgery

## 2014-02-08 ENCOUNTER — Encounter (INDEPENDENT_AMBULATORY_CARE_PROVIDER_SITE_OTHER): Payer: Self-pay | Admitting: General Surgery

## 2014-02-08 VITALS — BP 118/78 | HR 71 | Temp 98.1°F | Resp 18 | Ht 62.0 in | Wt 261.8 lb

## 2014-02-08 DIAGNOSIS — K439 Ventral hernia without obstruction or gangrene: Secondary | ICD-10-CM

## 2014-02-08 NOTE — Progress Notes (Signed)
Patient ID: Jasmine [name], female   DOB: 17-Jul-1966, 48 y.o.   MRN: [id number]  No chief complaint on file.   HPI Jasmine [name] is a 48 y.o. female.  I will and the patient is a 48 year old female referred by Dr. Harland GermanHomel for evaluation of umbilical hernia. The patient states that there is been there for several months. She states become more symptomatic with pain. She's not had any signs or symptoms of incarceration or strangulation. He does notice bulge just superior to her umbilicus. She states  That it's self reducing.  The patient does have a history of an umbilical hernia repair as a infant. HPI  Past Medical History  Diagnosis Date  . Diabetes mellitus without complication   . Hypercholesteremia   . Hypertension   . Umbilical hernia without mention of obstruction or gangrene   . Chronic kidney disease     chronic renal insufficiency, stage II (mild)    No past surgical history on file.  Family History  Problem Relation Age of Onset  . Diabetes Mother   . Diabetes Father   . Cancer Father     Prostate cancer  . Cancer Sister     Cervical cancer  . Diabetes Brother     Social History History  Substance Use Topics  . Smoking status: Current Every Day Smoker    Types: Cigarettes  . Smokeless tobacco: Never Used  . Alcohol Use: No    Allergies  Allergen Reactions  . Eggs Or Egg-Derived Products   . Metformin And Related Hives    Current Outpatient Prescriptions  Medication Sig Dispense Refill  . BD PEN NEEDLE NANO U/F 32G X 4 MM MISC       . Cetirizine HCl (ZYRTEC PO) Take by mouth.        . insulin glargine (LANTUS) 100 UNIT/ML injection 30 Units SQ QHS  10 mL    . losartan (COZAAR) 25 MG tablet TAKE 1 TABLET BY MOUTH EVERY DAY  90 tablet  1  . meloxicam (MOBIC) 15 MG tablet One tab PO qAM with breakfast for 2 weeks, then daily prn pain.  30 tablet  3  . NOVOLOG FLEXPEN 100 UNIT/ML FlexPen       . ONETOUCH DELICA LANCETS FINE MISC       . phentermine 37.5  MG capsule Take 1 capsule (37.5 mg total) by mouth every morning.  30 capsule  0  . sertraline (ZOLOFT) 50 MG tablet Take 1 tablet (50 mg total) by mouth daily.  90 tablet  0  . simvastatin (ZOCOR) 10 MG tablet Take 10 mg by mouth at bedtime.      . sitaGLIPtin (JANUVIA) 100 MG tablet Take 1 tablet (100 mg total) by mouth daily.  30 tablet  3  . traMADol (ULTRAM) 50 MG tablet Take 50 mg by mouth every 6 (six) hours as needed.       Current Facility-Administered Medications  Medication Dose Route Frequency Provider Last Rate Last Dose  . levonorgestrel (MIRENA) 20 MCG/24HR IUD   Intrauterine Once Ok EdwardsJuan H Fernandez, MD        Review of Systems Review of Systems  Constitutional: Negative.   HENT: Negative.   Respiratory: Negative.   Cardiovascular: Negative.   Gastrointestinal: Negative.   Neurological: Negative.   All other systems reviewed and are negative.   Blood pressure 118/78, pulse 71, temperature 98.1 F (36.7 C), temperature source Temporal, resp. rate 18, height 5\' 2"  (1.575 m), weight 261  lb 12.8 oz (118.752 kg).  Physical Exam Physical Exam  Constitutional: She is oriented to person, place, and time. She appears well-developed and well-nourished.  HENT:  Head: Normocephalic and atraumatic.  Eyes: Conjunctivae and EOM are normal. Pupils are equal, round, and reactive to light.  Neck: Normal range of motion. Neck supple.  Cardiovascular: Normal rate, regular rhythm and normal heart sounds.   Pulmonary/Chest: Effort normal and breath sounds normal.  Abdominal: Soft. Bowel sounds are normal. A hernia is present. Hernia confirmed positive in the ventral area.    Musculoskeletal: Normal range of motion.  Neurological: She is alert and oriented to person, place, and time.  Skin: Skin is warm and dry.  Psychiatric: She has a normal mood and affect.    Data Reviewed none  Assessment    48 year old female with possible ventral hernia versus rectus diastases.      Plan    1. We'll proceed with a CT scan of her abdomen and pelvis tonight for possible hernia versus rectus diastases. 2. She had a hernia we'll proceed with laparoscopic ventral hernia repair with mesh. 3.All risks and benefits were discussed with the patient, to generally include infection, bleeding, damage to surrounding structures, acute and chronic nerve pain, and recurrence. Alternatives were offered and described.  All questions were answered and the patient voiced understanding of the procedure and wishes to proceed at this point.  4. We'll call the patient back with the results and schedule for surgery should be hernia.        [name], [name] 02/08/2014, 11:46 AM

## 2014-02-14 ENCOUNTER — Other Ambulatory Visit: Payer: 59

## 2014-02-26 ENCOUNTER — Ambulatory Visit: Payer: 59 | Admitting: Family Medicine

## 2014-02-27 ENCOUNTER — Ambulatory Visit
Admission: RE | Admit: 2014-02-27 | Discharge: 2014-02-27 | Disposition: A | Discharge status: Home / Self Care | Payer: 59 | Source: Ambulatory Visit | Attending: General Surgery | Admitting: General Surgery

## 2014-02-27 DIAGNOSIS — K439 Ventral hernia without obstruction or gangrene: Secondary | ICD-10-CM

## 2014-02-27 MED ORDER — IOHEXOL 300 MG/ML  SOLN
125.0000 mL | Freq: Once | INTRAMUSCULAR | Status: AC | PRN
  Administered 2014-02-27: 125 mL via INTRAVENOUS

## 2014-02-28 ENCOUNTER — Telehealth (INDEPENDENT_AMBULATORY_CARE_PROVIDER_SITE_OTHER): Payer: Self-pay | Admitting: General Surgery

## 2014-02-28 ENCOUNTER — Telehealth (INDEPENDENT_AMBULATORY_CARE_PROVIDER_SITE_OTHER): Payer: Self-pay

## 2014-02-28 NOTE — Telephone Encounter (Signed)
Message copied by Maryan PulsMOORE, Loman Logan on Wed Feb 28, 2014  1:41 PM ------      Message from: Axel FillerAMIREZ, ARMANDO      Created: Wed Feb 28, 2014 12:17 PM      Regarding: RE: surgery       Ventral      As soon as she wants i guess            ----- Message -----         From: Maryan Pulshristy Eugean Arnott, CMA         Sent: 02/28/2014  11:29 AM           To: Axel FillerArmando Ramirez, MD      Subject: surgery                                                  Ventral or Umbilical Hernia Repair?      How soon do we need to get her scheduled?            Nadim Malia      ----- Message -----         From: Axel FillerArmando Ramirez, MD         Sent: 02/28/2014  10:54 AM           To: Maryan Pulshristy Cristal Qadir, CMA            We have to get her scheduled for surgery.  CAn you call her and tell her the results of her CT (hernia)?  And see if she wants to come back in for me to talk to her about it again or if she's just comfortable with scheduling it.            Thanks      AR             ------

## 2014-02-28 NOTE — Telephone Encounter (Signed)
Called and spoke to patient regarding CT results. Patient aware that she does have a hernia. Patient prefers to proceed with scheduling hernia repair surgery and feels all her questions and concerns were addressed during her last office visit with Dr. Derrell Lollingamirez. Patient declines office appointment prior to surgery. Patient aware that our surgery schedulers will call once they have received the OR posting sheet from billing/coding. Patient verbalized understanding.

## 2014-02-28 NOTE — Telephone Encounter (Signed)
Called and spoke to patient regarding CT results. Patient aware that she does have a hernia. Patient prefers to proceed with scheduling hernia repair surgery and feels all her questions and concerns were addressed during her last office visit with Dr. Ramirez. Patient declines office appointment prior to surgery. Patient aware that our surgery schedulers will call once they have received the OR posting sheet from billing/coding. Patient verbalized understanding.  

## 2014-02-28 NOTE — Telephone Encounter (Signed)
Pt called to ask if CT report has confirmed (or not) that she has a hernia.  Report does confirm this.  She understands that Dr. Derrell Lollingamirez is out of the office until first of August and will decide how to proceed at that time.  She understands and will wait for call from [facility] with his plan.

## 2014-03-14 ENCOUNTER — Telehealth: Payer: Self-pay | Admitting: *Deleted

## 2014-03-14 NOTE — Telephone Encounter (Signed)
Pt is checking on the status of her hernia surgery. She hasn't heard anything about scheduling

## 2014-03-15 ENCOUNTER — Telehealth (INDEPENDENT_AMBULATORY_CARE_PROVIDER_SITE_OTHER): Payer: Self-pay

## 2014-03-15 NOTE — Telephone Encounter (Signed)
Pt called in asking how long she would be out of work for lap ventral hernia repair. Advised 2-4 weeks. She asked what forms she needs to supply for work and let her know she needs to call her HR dept and ask for FMLA/STD forms. Pt understanding.

## 2014-03-15 NOTE — Telephone Encounter (Signed)
I called [facility] [phone] and left referral person- Janeth Raseina Lowery and message asking her to f/u with this patient and make sure she gets scheduled

## 2014-03-19 ENCOUNTER — Other Ambulatory Visit (INDEPENDENT_AMBULATORY_CARE_PROVIDER_SITE_OTHER): Payer: Self-pay | Admitting: General Surgery

## 2014-03-19 ENCOUNTER — Encounter (HOSPITAL_COMMUNITY): Payer: Self-pay | Admitting: Pharmacy Technician

## 2014-03-21 ENCOUNTER — Encounter (HOSPITAL_COMMUNITY)
Admission: RE | Admit: 2014-03-21 | Discharge: 2014-03-21 | Disposition: A | Discharge status: Home / Self Care | Payer: 59 | Source: Ambulatory Visit | Attending: General Surgery | Admitting: General Surgery

## 2014-03-21 ENCOUNTER — Encounter (HOSPITAL_COMMUNITY): Payer: Self-pay

## 2014-03-21 ENCOUNTER — Encounter (HOSPITAL_COMMUNITY): Admission: RE | Admit: 2014-03-21 | Payer: 59 | Source: Ambulatory Visit

## 2014-03-21 DIAGNOSIS — Z01810 Encounter for preprocedural cardiovascular examination: Secondary | ICD-10-CM | POA: Diagnosis not present

## 2014-03-21 DIAGNOSIS — Z01812 Encounter for preprocedural laboratory examination: Secondary | ICD-10-CM | POA: Insufficient documentation

## 2014-03-21 HISTORY — DX: Anesthesia of skin: R20.0

## 2014-03-21 HISTORY — DX: Other seasonal allergic rhinitis: J30.2

## 2014-03-21 HISTORY — DX: Presence of spectacles and contact lenses: Z97.3

## 2014-03-21 HISTORY — DX: Major depressive disorder, single episode, unspecified: F32.9

## 2014-03-21 HISTORY — DX: Depression, unspecified: F32.A

## 2014-03-21 HISTORY — DX: Paresthesia of skin: R20.2

## 2014-03-21 LAB — BASIC METABOLIC PANEL
Anion gap: 12 (ref 5–15)
BUN: 11 mg/dL (ref 6–23)
CALCIUM: 9.5 mg/dL (ref 8.4–10.5)
CO2: 25 mEq/L (ref 19–32)
CREATININE: 1.22 mg/dL — AB (ref 0.50–1.10)
Chloride: 102 mEq/L (ref 96–112)
GFR calc Af Amer: 61 mL/min — ABNORMAL LOW (ref >90)
GFR calc non Af Amer: 52 mL/min — ABNORMAL LOW (ref >90)
GLUCOSE: 138 mg/dL — AB (ref 70–99)
Potassium: 4.9 mEq/L (ref 3.7–5.3)
Sodium: 139 mEq/L (ref 137–147)

## 2014-03-21 LAB — CBC
HCT: 41.3 % (ref 36.0–46.0)
Hemoglobin: 13 g/dL (ref 12.0–15.0)
MCH: 23.7 pg — ABNORMAL LOW (ref 26.0–34.0)
MCHC: 31.5 g/dL (ref 30.0–36.0)
MCV: 75.4 fL — ABNORMAL LOW (ref 78.0–100.0)
PLATELETS: 274 10*3/uL (ref 150–400)
RBC: 5.48 MIL/uL — ABNORMAL HIGH (ref 3.87–5.11)
RDW: 15.6 % — AB (ref 11.5–15.5)
WBC: 5.1 10*3/uL (ref 4.0–10.5)

## 2014-03-21 LAB — HCG, SERUM, QUALITATIVE: Preg, Serum: NEGATIVE

## 2014-03-21 NOTE — Pre-Procedure Instructions (Signed)
Jasmine [name]  03/21/2014   Your procedure is scheduled on: Wednesday, March 28, 2014 at 12:00  [facility] at 10;00 AM.  Call this number if you have problems the morning of surgery: [phone]   Remember:   Do not eat food or drink liquids after midnight Tuesday, March 27, 2014    Take these medicines the morning of surgery with A SIP OF WATER: cetirizine (ZYRTEC), sertraline (ZOLOFT), if needed: traMADol (ULTRAM) for pain, fluticasone (FLONASE) nasal spray for allergies  DO NOT TAKE ANY DIABETIC MEDICATIONS THE MORNING OF SURGERY  Stop taking Aspirin,vitamins, phentermine and herbal medications. Do not take any NSAIDs ie: Ibuprofen, Advil, Naproxen or any medication containing Aspirin ; stop now.   Do not wear jewelry, make-up or nail polish  Do not wear lotions, powders, or perfumes. You may NOT wear deodorant.  Do not shave 48 hours prior to surgery.   Do not bring valuables to the hospital.  [facility] for any belongings or valuables.               Contacts, dentures or bridgework may not be worn into surgery.  Leave suitcase in the car. After surgery it may be brought to your room.  For patients admitted to the hospital, discharge time is determined by your treatment team.               Patients discharged the day of surgery will not be allowed to drive home.  Name and phone number of your driver:   Special Instructions:  Special Instructions:Special Instructions: [facility] - Preparing for [facility]  Before surgery, you can play an important role.  Because skin is not sterile, your skin needs to be as free of germs as possible.  You can reduce the number of germs on you skin by washing with CHG (chlorahexidine gluconate) soap before surgery.  CHG is an antiseptic cleaner which kills germs and bonds with the skin to continue killing germs even after washing.  Please DO NOT use if you have an allergy to CHG or  antibacterial soaps.  If your skin becomes reddened/irritated stop using the CHG and inform your nurse when you arrive at Short Stay.  Do not shave (including legs and underarms) for at least 48 hours prior to the first CHG shower.  You may shave your face.  Please follow these instructions carefully:   1.  Shower with CHG Soap the night before surgery and the morning of Surgery.  2.  If you choose to wash your hair, wash your hair first as usual with your normal shampoo.  3.  After you shampoo, rinse your hair and body thoroughly to remove the Shampoo.  4.  Use CHG as you would any other liquid soap.  You can apply chg directly  to the skin and wash gently with scrungie or a clean washcloth.  5.  Apply the CHG Soap to your body ONLY FROM THE NECK DOWN.  Do not use on open wounds or open sores.  Avoid contact with your eyes, ears, mouth and genitals (private parts).  Wash genitals (private parts) with your normal soap.  6.  Wash thoroughly, paying special attention to the area where your surgery will be performed.  7.  Thoroughly rinse your body with warm water from the neck down.  8.  DO NOT shower/wash with your normal soap after using and rinsing off the CHG Soap.  9.  Pat yourself  dry with a clean towel.            10.  Wear clean pajamas.            11.  Place clean sheets on your bed the night of your first shower and do not sleep with pets.  Day of Surgery  Do not apply any lotions/deodorants the morning of surgery.  Please wear clean clothes to the hospital/surgery center.   Please read over the following fact sheets that you were given: Pain Booklet, Coughing and Deep Breathing and Surgical Site Infection Prevention

## 2014-03-21 NOTE — Progress Notes (Signed)
Pt stated that she stopped taking Phentermine " a week or so ago."

## 2014-03-21 NOTE — Progress Notes (Signed)
Pt denies SOB, chest pain, and being under the care of a cardiologist. Pt denies having an EKG and chest x ray within the last year. Pt denies having a  stress test, echo, and cardiac cath. Chest x ray pending DOS after serum Hcg.

## 2014-03-21 NOTE — Progress Notes (Signed)
03/21/14 0836  OBSTRUCTIVE SLEEP APNEA  Have you ever been diagnosed with sleep apnea through a sleep study? No  Do you snore loudly (loud enough to be heard through closed doors)?  1  Do you often feel tired, fatigued, or sleepy during the daytime? 1  Has anyone observed you stop breathing during your sleep? 0  Do you have, or are you being treated for high blood pressure? 1  BMI more than 35 kg/m2? 1  Age over 46 years old? 0  Neck circumference greater than 40 cm/16 inches? 1  Gender: 0  Obstructive Sleep Apnea Score 5

## 2014-03-27 ENCOUNTER — Ambulatory Visit: Payer: 59 | Admitting: Family Medicine

## 2014-03-27 MED ORDER — CEFAZOLIN SODIUM-DEXTROSE 2-3 GM-% IV SOLR
2.0000 g | INTRAVENOUS | Status: AC
  Administered 2014-03-28: 2 g via INTRAVENOUS
  Filled 2014-03-27: qty 50

## 2014-03-28 ENCOUNTER — Ambulatory Visit (HOSPITAL_COMMUNITY): Payer: 59

## 2014-03-28 ENCOUNTER — Encounter (HOSPITAL_COMMUNITY): Payer: Self-pay | Admitting: *Deleted

## 2014-03-28 ENCOUNTER — Ambulatory Visit (HOSPITAL_COMMUNITY): Payer: 59 | Admitting: Anesthesiology

## 2014-03-28 ENCOUNTER — Encounter (HOSPITAL_COMMUNITY)
Admission: RE | Disposition: A | Discharge status: Home / Self Care | Payer: Self-pay | Source: Ambulatory Visit | Attending: General Surgery

## 2014-03-28 ENCOUNTER — Observation Stay (HOSPITAL_COMMUNITY)
Admission: RE | Admit: 2014-03-28 | Discharge: 2014-03-29 | Disposition: A | Discharge status: Home / Self Care | Payer: 59 | Source: Ambulatory Visit | Attending: General Surgery | Admitting: General Surgery

## 2014-03-28 ENCOUNTER — Encounter (HOSPITAL_COMMUNITY): Payer: 59 | Admitting: Anesthesiology

## 2014-03-28 DIAGNOSIS — Z888 Allergy status to other drugs, medicaments and biological substances status: Secondary | ICD-10-CM | POA: Diagnosis not present

## 2014-03-28 DIAGNOSIS — E119 Type 2 diabetes mellitus without complications: Secondary | ICD-10-CM | POA: Diagnosis not present

## 2014-03-28 DIAGNOSIS — E7800 Pure hypercholesterolemia, unspecified: Secondary | ICD-10-CM | POA: Insufficient documentation

## 2014-03-28 DIAGNOSIS — Z794 Long term (current) use of insulin: Secondary | ICD-10-CM | POA: Diagnosis not present

## 2014-03-28 DIAGNOSIS — Z91012 Allergy to eggs: Secondary | ICD-10-CM | POA: Insufficient documentation

## 2014-03-28 DIAGNOSIS — I129 Hypertensive chronic kidney disease with stage 1 through stage 4 chronic kidney disease, or unspecified chronic kidney disease: Secondary | ICD-10-CM | POA: Diagnosis not present

## 2014-03-28 DIAGNOSIS — F17200 Nicotine dependence, unspecified, uncomplicated: Secondary | ICD-10-CM | POA: Insufficient documentation

## 2014-03-28 DIAGNOSIS — Z79899 Other long term (current) drug therapy: Secondary | ICD-10-CM | POA: Insufficient documentation

## 2014-03-28 DIAGNOSIS — N189 Chronic kidney disease, unspecified: Secondary | ICD-10-CM | POA: Diagnosis not present

## 2014-03-28 DIAGNOSIS — K439 Ventral hernia without obstruction or gangrene: Principal | ICD-10-CM | POA: Insufficient documentation

## 2014-03-28 DIAGNOSIS — K432 Incisional hernia without obstruction or gangrene: Secondary | ICD-10-CM

## 2014-03-28 HISTORY — DX: Chronic kidney disease, unspecified: N18.9

## 2014-03-28 HISTORY — DX: Type 2 diabetes mellitus without complications: E11.9

## 2014-03-28 HISTORY — PX: INSERTION OF MESH: SHX5868

## 2014-03-28 HISTORY — PX: VENTRAL HERNIA REPAIR: SHX424

## 2014-03-28 HISTORY — DX: Unspecified osteoarthritis, unspecified site: M19.90

## 2014-03-28 LAB — GLUCOSE, CAPILLARY
GLUCOSE-CAPILLARY: 143 mg/dL — AB (ref 70–99)
GLUCOSE-CAPILLARY: 141 mg/dL — AB (ref 70–99)
GLUCOSE-CAPILLARY: 191 mg/dL — AB (ref 70–99)
Glucose-Capillary: 121 mg/dL — ABNORMAL HIGH (ref 70–99)
Glucose-Capillary: 213 mg/dL — ABNORMAL HIGH (ref 70–99)

## 2014-03-28 SURGERY — REPAIR, HERNIA, VENTRAL, LAPAROSCOPIC
Anesthesia: General | Site: Abdomen

## 2014-03-28 MED ORDER — MEPERIDINE HCL 25 MG/ML IJ SOLN: 6.2500 mg | INTRAMUSCULAR | Status: DC | PRN

## 2014-03-28 MED ORDER — OXYCODONE HCL 5 MG/5ML PO SOLN
ORAL | Status: AC
  Filled 2014-03-28: qty 5

## 2014-03-28 MED ORDER — SIMVASTATIN 10 MG PO TABS
10.0000 mg | ORAL_TABLET | Freq: Every day | ORAL | Status: DC
  Administered 2014-03-28: 10 mg via ORAL
  Filled 2014-03-28 (×2): qty 1

## 2014-03-28 MED ORDER — KETOROLAC TROMETHAMINE 15 MG/ML IJ SOLN
INTRAMUSCULAR | Status: AC
  Filled 2014-03-28: qty 1

## 2014-03-28 MED ORDER — FENTANYL CITRATE 0.05 MG/ML IJ SOLN
INTRAMUSCULAR | Status: DC | PRN
Start: 2014-03-28 — End: 2014-03-28
  Administered 2014-03-28 (×2): 50 ug via INTRAVENOUS
  Administered 2014-03-28: 100 ug via INTRAVENOUS
  Administered 2014-03-28: 50 ug via INTRAVENOUS

## 2014-03-28 MED ORDER — ONDANSETRON HCL 4 MG/2ML IJ SOLN: 4.0000 mg | Freq: Four times a day (QID) | INTRAMUSCULAR | Status: DC | PRN

## 2014-03-28 MED ORDER — OXYCODONE-ACETAMINOPHEN 5-325 MG PO TABS
1.0000 | ORAL_TABLET | ORAL | Status: DC | PRN
Start: 2014-03-28 — End: 2014-07-20

## 2014-03-28 MED ORDER — KETOROLAC TROMETHAMINE 30 MG/ML IJ SOLN: 30.0000 mg | Freq: Once | INTRAMUSCULAR | Status: DC

## 2014-03-28 MED ORDER — FLUTICASONE PROPIONATE 50 MCG/ACT NA SUSP: 1.0000 | Freq: Every day | NASAL | Status: DC | PRN

## 2014-03-28 MED ORDER — PHENYLEPHRINE HCL 10 MG/ML IJ SOLN
INTRAMUSCULAR | Status: DC | PRN
  Administered 2014-03-28: 80 ug via INTRAVENOUS
  Administered 2014-03-28 (×2): 40 ug via INTRAVENOUS

## 2014-03-28 MED ORDER — OXYCODONE HCL 5 MG/5ML PO SOLN
5.0000 mg | Freq: Once | ORAL | Status: AC | PRN
  Administered 2014-03-28: 5 mg via ORAL

## 2014-03-28 MED ORDER — NEOSTIGMINE METHYLSULFATE 10 MG/10ML IV SOLN
INTRAVENOUS | Status: DC | PRN
Start: 2014-03-28 — End: 2014-03-28
  Administered 2014-03-28: 4 mg via INTRAVENOUS

## 2014-03-28 MED ORDER — OXYCODONE HCL 5 MG PO TABS
5.0000 mg | ORAL_TABLET | ORAL | Status: DC | PRN
  Administered 2014-03-28: 5 mg via ORAL
  Administered 2014-03-29 (×2): 10 mg via ORAL
  Filled 2014-03-28 (×2): qty 2

## 2014-03-28 MED ORDER — GLYCOPYRROLATE 0.2 MG/ML IJ SOLN
INTRAMUSCULAR | Status: DC | PRN
  Administered 2014-03-28: .6 mg via INTRAVENOUS

## 2014-03-28 MED ORDER — INSULIN GLARGINE 100 UNIT/ML ~~LOC~~ SOLN
10.0000 [IU] | Freq: Every day | SUBCUTANEOUS | Status: DC
  Administered 2014-03-28: 10 [IU] via SUBCUTANEOUS
  Filled 2014-03-28 (×2): qty 0.1

## 2014-03-28 MED ORDER — NEOSTIGMINE METHYLSULFATE 10 MG/10ML IV SOLN
INTRAVENOUS | Status: AC
  Filled 2014-03-28: qty 1

## 2014-03-28 MED ORDER — KETOROLAC TROMETHAMINE 30 MG/ML IJ SOLN: 15.0000 mg | Freq: Once | INTRAMUSCULAR | Status: DC

## 2014-03-28 MED ORDER — HYDROMORPHONE HCL PF 1 MG/ML IJ SOLN
INTRAMUSCULAR | Status: AC
  Filled 2014-03-28: qty 1

## 2014-03-28 MED ORDER — CHLORHEXIDINE GLUCONATE 4 % EX LIQD
1.0000 "application " | Freq: Once | CUTANEOUS | Status: DC
  Filled 2014-03-28: qty 15

## 2014-03-28 MED ORDER — KETOROLAC TROMETHAMINE 30 MG/ML IJ SOLN
INTRAMUSCULAR | Status: AC
  Filled 2014-03-28: qty 1

## 2014-03-28 MED ORDER — LIDOCAINE HCL (CARDIAC) 20 MG/ML IV SOLN
INTRAVENOUS | Status: DC | PRN
  Administered 2014-03-28: 100 mg via INTRAVENOUS

## 2014-03-28 MED ORDER — SODIUM CHLORIDE 0.9 % IR SOLN
Status: DC | PRN
  Administered 2014-03-28: 1000 mL

## 2014-03-28 MED ORDER — PROMETHAZINE HCL 25 MG/ML IJ SOLN: 6.2500 mg | INTRAMUSCULAR | Status: DC | PRN

## 2014-03-28 MED ORDER — DEXAMETHASONE SODIUM PHOSPHATE 4 MG/ML IJ SOLN
INTRAMUSCULAR | Status: DC | PRN
  Administered 2014-03-28: 4 mg via INTRAVENOUS

## 2014-03-28 MED ORDER — OXYCODONE HCL 5 MG PO TABS
ORAL_TABLET | ORAL | Status: AC
  Filled 2014-03-28: qty 1

## 2014-03-28 MED ORDER — 0.9 % SODIUM CHLORIDE (POUR BTL) OPTIME
TOPICAL | Status: DC | PRN
  Administered 2014-03-28: 1000 mL

## 2014-03-28 MED ORDER — ENOXAPARIN SODIUM 40 MG/0.4ML ~~LOC~~ SOLN
40.0000 mg | SUBCUTANEOUS | Status: DC
  Administered 2014-03-28: 40 mg via SUBCUTANEOUS
  Filled 2014-03-28 (×2): qty 0.4

## 2014-03-28 MED ORDER — PROPOFOL 10 MG/ML IV BOLUS
INTRAVENOUS | Status: AC
  Filled 2014-03-28: qty 20

## 2014-03-28 MED ORDER — INSULIN ASPART 100 UNIT/ML ~~LOC~~ SOLN
0.0000 [IU] | Freq: Three times a day (TID) | SUBCUTANEOUS | Status: DC
  Administered 2014-03-29: 2 [IU] via SUBCUTANEOUS
  Administered 2014-03-29: 3 [IU] via SUBCUTANEOUS

## 2014-03-28 MED ORDER — LIDOCAINE HCL (CARDIAC) 20 MG/ML IV SOLN
INTRAVENOUS | Status: AC
  Filled 2014-03-28: qty 5

## 2014-03-28 MED ORDER — SODIUM CHLORIDE 0.9 % IV SOLN
INTRAVENOUS | Status: DC
  Administered 2014-03-28: 20:00:00 via INTRAVENOUS

## 2014-03-28 MED ORDER — TRAMADOL HCL 50 MG PO TABS: 50.0000 mg | ORAL_TABLET | Freq: Four times a day (QID) | ORAL | Status: DC | PRN

## 2014-03-28 MED ORDER — LINAGLIPTIN 5 MG PO TABS
5.0000 mg | ORAL_TABLET | Freq: Every day | ORAL | Status: DC
  Administered 2014-03-29: 5 mg via ORAL
  Filled 2014-03-28 (×2): qty 1

## 2014-03-28 MED ORDER — ROCURONIUM BROMIDE 50 MG/5ML IV SOLN
INTRAVENOUS | Status: AC
  Filled 2014-03-28: qty 1

## 2014-03-28 MED ORDER — FENTANYL CITRATE 0.05 MG/ML IJ SOLN
INTRAMUSCULAR | Status: AC
  Filled 2014-03-28: qty 5

## 2014-03-28 MED ORDER — ACETAMINOPHEN 10 MG/ML IV SOLN
INTRAVENOUS | Status: AC
  Filled 2014-03-28: qty 100

## 2014-03-28 MED ORDER — SERTRALINE HCL 50 MG PO TABS
50.0000 mg | ORAL_TABLET | Freq: Every day | ORAL | Status: DC
  Administered 2014-03-28 – 2014-03-29 (×2): 50 mg via ORAL
  Filled 2014-03-28 (×2): qty 1

## 2014-03-28 MED ORDER — ONDANSETRON HCL 4 MG/2ML IJ SOLN
INTRAMUSCULAR | Status: AC
  Filled 2014-03-28: qty 2

## 2014-03-28 MED ORDER — KETOROLAC TROMETHAMINE 15 MG/ML IJ SOLN
15.0000 mg | Freq: Four times a day (QID) | INTRAMUSCULAR | Status: DC
  Administered 2014-03-28 – 2014-03-29 (×2): 15 mg via INTRAVENOUS
  Filled 2014-03-28 (×6): qty 1

## 2014-03-28 MED ORDER — LACTATED RINGERS IV SOLN
INTRAVENOUS | Status: DC | PRN
  Administered 2014-03-28: 13:00:00 via INTRAVENOUS

## 2014-03-28 MED ORDER — HYDROMORPHONE HCL PF 1 MG/ML IJ SOLN: 0.5000 mg | INTRAMUSCULAR | Status: DC | PRN

## 2014-03-28 MED ORDER — MIDAZOLAM HCL 5 MG/5ML IJ SOLN
INTRAMUSCULAR | Status: DC | PRN
  Administered 2014-03-28: 2 mg via INTRAVENOUS

## 2014-03-28 MED ORDER — PNEUMOCOCCAL VAC POLYVALENT 25 MCG/0.5ML IJ INJ
0.5000 mL | INJECTION | INTRAMUSCULAR | Status: AC
  Administered 2014-03-29: 0.5 mL via INTRAMUSCULAR
  Filled 2014-03-28: qty 0.5

## 2014-03-28 MED ORDER — OXYCODONE HCL 5 MG PO TABS: 5.0000 mg | ORAL_TABLET | Freq: Once | ORAL | Status: AC | PRN

## 2014-03-28 MED ORDER — BUPIVACAINE-EPINEPHRINE (PF) 0.25% -1:200000 IJ SOLN
INTRAMUSCULAR | Status: AC
  Filled 2014-03-28: qty 30

## 2014-03-28 MED ORDER — SODIUM CHLORIDE 0.9 % IV SOLN
INTRAVENOUS | Status: DC
  Administered 2014-03-28 (×2): via INTRAVENOUS

## 2014-03-28 MED ORDER — HYDROMORPHONE HCL PF 1 MG/ML IJ SOLN
0.2500 mg | INTRAMUSCULAR | Status: DC | PRN
  Administered 2014-03-28: 1 mg via INTRAVENOUS
  Administered 2014-03-28 (×2): 0.5 mg via INTRAVENOUS

## 2014-03-28 MED ORDER — LOSARTAN POTASSIUM 25 MG PO TABS
25.0000 mg | ORAL_TABLET | Freq: Every day | ORAL | Status: DC
  Administered 2014-03-28: 25 mg via ORAL
  Filled 2014-03-28 (×2): qty 1

## 2014-03-28 MED ORDER — MIDAZOLAM HCL 2 MG/2ML IJ SOLN
INTRAMUSCULAR | Status: AC
  Filled 2014-03-28: qty 2

## 2014-03-28 MED ORDER — GLYCOPYRROLATE 0.2 MG/ML IJ SOLN
INTRAMUSCULAR | Status: AC
  Filled 2014-03-28: qty 2

## 2014-03-28 MED ORDER — ONDANSETRON HCL 4 MG/2ML IJ SOLN
INTRAMUSCULAR | Status: DC | PRN
  Administered 2014-03-28: 4 mg via INTRAVENOUS

## 2014-03-28 MED ORDER — ACETAMINOPHEN 10 MG/ML IV SOLN
INTRAVENOUS | Status: DC | PRN
  Administered 2014-03-28: 1000 mg via INTRAVENOUS

## 2014-03-28 MED ORDER — PROPOFOL 10 MG/ML IV BOLUS
INTRAVENOUS | Status: DC | PRN
  Administered 2014-03-28: 200 mg via INTRAVENOUS

## 2014-03-28 MED ORDER — HYDROMORPHONE HCL PF 1 MG/ML IJ SOLN
INTRAMUSCULAR | Status: AC
Start: 2014-03-28 — End: 2014-03-29
  Filled 2014-03-28: qty 1

## 2014-03-28 MED ORDER — BUPIVACAINE HCL 0.25 % IJ SOLN
INTRAMUSCULAR | Status: DC | PRN
  Administered 2014-03-28: 30 mL

## 2014-03-28 MED ORDER — ROCURONIUM BROMIDE 100 MG/10ML IV SOLN
INTRAVENOUS | Status: DC | PRN
  Administered 2014-03-28: 40 mg via INTRAVENOUS
  Administered 2014-03-28: 10 mg via INTRAVENOUS

## 2014-03-28 MED ORDER — KETOROLAC TROMETHAMINE 15 MG/ML IJ SOLN
15.0000 mg | Freq: Once | INTRAMUSCULAR | Status: AC
  Administered 2014-03-28: 15 mg via INTRAVENOUS

## 2014-03-28 SURGICAL SUPPLY — 52 items
APPLIER CLIP 5 13 M/L LIGAMAX5 (MISCELLANEOUS)
BENZOIN TINCTURE PRP APPL 2/3 (GAUZE/BANDAGES/DRESSINGS) ×3 IMPLANT
CANISTER SUCTION 2500CC (MISCELLANEOUS) IMPLANT
CHLORAPREP W/TINT 26ML (MISCELLANEOUS) ×3 IMPLANT
CLIP APPLIE 5 13 M/L LIGAMAX5 (MISCELLANEOUS) IMPLANT
CLOSURE WOUND 1/2 X4 (GAUZE/BANDAGES/DRESSINGS) ×1
COVER SURGICAL LIGHT HANDLE (MISCELLANEOUS) ×3 IMPLANT
DEVICE RELIATACK FIXATION (MISCELLANEOUS) ×3 IMPLANT
DEVICE SECURE STRAP 25 ABSORB (INSTRUMENTS) IMPLANT
DEVICE TROCAR PUNCTURE CLOSURE (ENDOMECHANICALS) ×3 IMPLANT
DRAPE UTILITY 15X26 W/TAPE STR (DRAPE) ×6 IMPLANT
ELECT REM PT RETURN 9FT ADLT (ELECTROSURGICAL) ×3
ELECTRODE REM PT RTRN 9FT ADLT (ELECTROSURGICAL) ×1 IMPLANT
GAUZE SPONGE 2X2 8PLY STRL LF (GAUZE/BANDAGES/DRESSINGS) ×1 IMPLANT
GLOVE BIO SURGEON STRL SZ 6 (GLOVE) ×12 IMPLANT
GLOVE BIO SURGEON STRL SZ7.5 (GLOVE) ×6 IMPLANT
GLOVE BIOGEL PI IND STRL 6.5 (GLOVE) ×2 IMPLANT
GLOVE BIOGEL PI IND STRL 7.0 (GLOVE) ×1 IMPLANT
GLOVE BIOGEL PI INDICATOR 6.5 (GLOVE) ×4
GLOVE BIOGEL PI INDICATOR 7.0 (GLOVE) ×2
GLOVE SURG SS PI 6.5 STRL IVOR (GLOVE) ×9 IMPLANT
GOWN STRL REUS W/ TWL LRG LVL3 (GOWN DISPOSABLE) ×4 IMPLANT
GOWN STRL REUS W/ TWL XL LVL3 (GOWN DISPOSABLE) ×1 IMPLANT
GOWN STRL REUS W/TWL LRG LVL3 (GOWN DISPOSABLE) ×8
GOWN STRL REUS W/TWL XL LVL3 (GOWN DISPOSABLE) ×2
KIT BASIN OR (CUSTOM PROCEDURE TRAY) ×3 IMPLANT
KIT ROOM TURNOVER OR (KITS) ×3 IMPLANT
MARKER SKIN DUAL TIP RULER LAB (MISCELLANEOUS) ×3 IMPLANT
MESH VENTRALIGHT ST 8IN CRC (Mesh General) ×3 IMPLANT
NEEDLE INSUFFLATION 14GA 120MM (NEEDLE) ×3 IMPLANT
NEEDLE SPNL 22GX3.5 QUINCKE BK (NEEDLE) ×3 IMPLANT
NS IRRIG 1000ML POUR BTL (IV SOLUTION) ×3 IMPLANT
PAD ARMBOARD 7.5X6 YLW CONV (MISCELLANEOUS) ×6 IMPLANT
RELOAD RELIATACK 10 (MISCELLANEOUS) ×9 IMPLANT
SCALPEL HARMONIC ACE (MISCELLANEOUS) IMPLANT
SCISSORS LAP 5X35 DISP (ENDOMECHANICALS) ×3 IMPLANT
SET IRRIG TUBING LAPAROSCOPIC (IRRIGATION / IRRIGATOR) IMPLANT
SLEEVE ENDOPATH XCEL 5M (ENDOMECHANICALS) ×3 IMPLANT
SPONGE GAUZE 2X2 STER 10/PKG (GAUZE/BANDAGES/DRESSINGS) ×2
STRIP CLOSURE SKIN 1/2X4 (GAUZE/BANDAGES/DRESSINGS) ×2 IMPLANT
SUT CHROMIC 2 0 SH (SUTURE) ×3 IMPLANT
SUT MNCRL AB 3-0 PS2 18 (SUTURE) ×3 IMPLANT
SUT NOVA NAB GS-21 0 18 T12 DT (SUTURE) ×3 IMPLANT
SUT PROLENE 2 0 KS (SUTURE) ×6 IMPLANT
TAPE CLOTH SOFT 2X10 (GAUZE/BANDAGES/DRESSINGS) ×3 IMPLANT
TOWEL OR 17X24 6PK STRL BLUE (TOWEL DISPOSABLE) ×3 IMPLANT
TOWEL OR 17X26 10 PK STRL BLUE (TOWEL DISPOSABLE) ×3 IMPLANT
TRAY FOLEY CATH 16FR SILVER (SET/KITS/TRAYS/PACK) IMPLANT
TRAY LAPAROSCOPIC (CUSTOM PROCEDURE TRAY) ×3 IMPLANT
TROCAR XCEL NON-BLD 11X100MML (ENDOMECHANICALS) ×3 IMPLANT
TROCAR XCEL NON-BLD 5MMX100MML (ENDOMECHANICALS) ×3 IMPLANT
TUBING INSUFFLATION 10FT LAP (TUBING) ×3 IMPLANT

## 2014-03-28 NOTE — Discharge Instructions (Signed)
[facility] _______[facility] ° °UMBILICAL OR INGUINAL HERNIA REPAIR: POST OP INSTRUCTIONS ° °Always review your discharge instruction sheet given to you by the facility where your surgery was performed. °IF YOU HAVE DISABILITY OR FAMILY LEAVE FORMS, YOU MUST BRING THEM TO THE OFFICE FOR PROCESSING.   °DO NOT GIVE THEM TO YOUR DOCTOR. ° °1. A  prescription for pain medication may be given to you upon discharge.  Take your pain medication as prescribed, if needed.  If narcotic pain medicine is not needed, then you may take acetaminophen (Tylenol) or ibuprofen (Advil) as needed. °2. Take your usually prescribed medications unless otherwise directed. °3. If you need a refill on your pain medication, please contact your pharmacy.  They will contact our office to request authorization. Prescriptions will not be filled after 5 pm or on week-ends. °4. You should follow a light diet the first 24 hours after arrival home, such as soup and crackers, etc.  Be sure to include lots of fluids daily.  Resume your normal diet the day after surgery. °5. Most patients will experience some swelling and bruising around the umbilicus or in the groin and scrotum.  Ice packs and reclining will help.  Swelling and bruising can take several days to resolve.  °6. It is common to experience some constipation if taking pain medication after surgery.  Increasing fluid intake and taking a stool softener (such as Colace) will usually help or prevent this problem from occurring.  A mild laxative (Milk of Magnesia or Miralax) should be taken according to package directions if there are no bowel movements after 48 hours. °7. Unless discharge instructions indicate otherwise, you may remove your bandages 24-48 hours after surgery, and you may shower at that time.  You may have steri-strips (small skin tapes) in place directly over the incision.  These strips should be left on the skin for 7-10 days.  If your surgeon used skin glue on the  incision, you may shower in 24 hours.  The glue will flake off over the next 2-3 weeks.  Any sutures or staples will be removed at the office during your follow-up visit. °8. ACTIVITIES:  You may resume regular (light) daily activities beginning the next day--such as daily self-care, walking, climbing stairs--gradually increasing activities as tolerated.  You may have sexual intercourse when it is comfortable.  Refrain from any heavy lifting or straining until approved by your doctor. °a. You may drive when you are no longer taking prescription pain medication, you can comfortably wear a seatbelt, and you can safely maneuver your car and apply brakes. °b. RETURN TO WORK:  __________________________________________________________ °9. You should see your doctor in the office for a follow-up appointment approximately 2-3 weeks after your surgery.  Make sure that you call for this appointment within a day or two after you arrive home to insure a convenient appointment time. °10. OTHER INSTRUCTIONS:  __________________________________________________________________________________________________________________________________________________________________________________________  °WHEN TO CALL YOUR DOCTOR: °1. Fever over 101.0 °2. Inability to urinate °3. Nausea and/or vomiting °4. Extreme swelling or bruising °5. Continued bleeding from incision. °6. Increased pain, redness, or drainage from the incision ° °The clinic staff is available to answer your questions during regular business hours.  Please don’t hesitate to call and ask to speak to one of the nurses for clinical concerns.  If you have a medical emergency, go to the nearest emergency room or call 911.  A surgeon from [facility] is always on call at the hospital ° ° °  [address], [address], [place], [place]  [address] ? ° [address] [address], [place],    [address] °[phone] ? [phone] ? FAX [phone] °Web site:  [email or web address] ° °What to eat: ° °For your first meals, you should eat lightly; only small meals initially.  If you do not have nausea, you may eat larger meals.  Avoid spicy, greasy and heavy food.   ° °General Anesthesia, Adult, Care After  °Refer to this sheet in the next few weeks. These instructions provide you with information on caring for yourself after your procedure. Your health care provider may also give you more specific instructions. Your treatment has been planned according to current medical practices, but problems sometimes occur. Call your health care provider if you have any problems or questions after your procedure.  °WHAT TO EXPECT AFTER THE PROCEDURE  °After the procedure, it is typical to experience:  °Sleepiness.  °Nausea and vomiting. °HOME CARE INSTRUCTIONS  °For the first 24 hours after general anesthesia:  °Have a responsible person with you.  °Do not drive a car. If you are alone, do not take public transportation.  °Do not drink alcohol.  °Do not take medicine that has not been prescribed by your health care provider.  °Do not sign important papers or make important decisions.  °You may resume a normal diet and activities as directed by your health care provider.  °Change bandages (dressings) as directed.  °If you have questions or problems that seem related to general anesthesia, call the hospital and ask for the anesthetist or anesthesiologist on call. °SEEK MEDICAL CARE IF:  °You have nausea and vomiting that continue the day after anesthesia.  °You develop a rash. °SEEK IMMEDIATE MEDICAL CARE IF:  °You have difficulty breathing.  °You have chest pain.  °You have any allergic problems. °Document Released: 11/02/2000 Document Revised: 03/29/2013 Document Reviewed: 02/09/2013  °ExitCare® Patient Information ©2014 [employer].  ° ° °

## 2014-03-28 NOTE — Anesthesia Procedure Notes (Signed)
Performed by: Vendela Troung J       

## 2014-03-28 NOTE — Op Note (Signed)
03/28/2014  2:04 PM  PATIENT:  Jasmine CardYolanda E Chaffin  48 y.o. female  PRE-OPERATIVE DIAGNOSIS:  ventral hernia  POST-OPERATIVE DIAGNOSIS:  ventral hernia  PROCEDURE:  Procedure(s): LAPAROSCOPIC VENTRAL HERNIA (N/A) INSERTION OF MESH (N/A)  SURGEON:  Surgeon(s) and Role:    * Axel FillerArmando Leonel Mccollum, MD - Primary  PHYSICIAN ASSISTANT:   ASSISTANTS: Norm SaltMary stuckey, [place] student   ANESTHESIA:   general  EBL:  Total I/O In: 200 [I.V.:200] Out: -   BLOOD ADMINISTERED:none  DRAINS: none   LOCAL MEDICATIONS USED:  MARCAINE     SPECIMEN:  No Specimen  DISPOSITION OF SPECIMEN:  N/A  COUNTS:  YES  TOURNIQUET:  * No tourniquets in log *  DICTATION: .Dragon Dictation Details of the procedure:   After the patient was consented patient was taken back to the operating room patient was then placed in supine position bilateral SCDs in place. After antibiotics were confirmed a timeout was called and all facts were verified. The Veress needle technique was used to insuflate the abdomen at Palmer's point. The abdomen was insufflated to 14 mm mercury. Subsequently a 5 mm trocar was placed a camera inserted there was no injury to any intra-abdominal organs.  There was seen to be a incarcerated ventral hernia hernia.  A second camera port was in placed into the left lower quadrant.   At this the Falicform ligament was taken down  with Bovie cautery maintaining hemostasis. A 5mm port was placed in the epigastrium . I proceeded to reduce the hernia contents.  Once the hernia was cleared away it measured to be 4x3cm.  The incarcerated fat was reduced and removed from teh abdomen.  I then placed a Bard Ventralight 20cm  mesh  into the abdomen.  The mesh was secured circumferentially with aa Covedian angle ttacker in a double crown fashion. The omentum was brought over the area of the mesh. The pneumoperitoneum was evacuated  & all trocars  were removed. The skin was reapproximated with 4-0  Monocryl sutures in a  subcuticular fashion. The skin was dressed with Steri-Strips tape and gauze.  The patient was taken to the recovery room in stable condition.    PLAN OF CARE: Discharge to home after PACU  PATIENT DISPOSITION:  PACU - hemodynamically stable.   Delay start of Pharmacological VTE agent (>24hrs) due to surgical blood loss or risk of bleeding: not applicable

## 2014-03-28 NOTE — Anesthesia Preprocedure Evaluation (Addendum)
Anesthesia Evaluation  Patient identified by MRN, date of birth, ID band Patient awake    Reviewed: Allergy & Precautions, H&P , NPO status , Patient's Chart, lab work & pertinent test results  Airway Mallampati: II TM Distance: >3 FB Neck ROM: Full    Dental no notable dental hx.    Pulmonary Current Smoker,  breath sounds clear to auscultation  Pulmonary exam normal       Cardiovascular hypertension, Pt. on medications Rhythm:Regular Rate:Normal     Neuro/Psych PSYCHIATRIC DISORDERS Depression negative neurological ROS     GI/Hepatic negative GI ROS, Neg liver ROS,   Endo/Other  diabetes, Type 2, Oral Hypoglycemic AgentsMorbid obesity  Renal/GU CRF and Renal InsufficiencyRenal disease     Musculoskeletal negative musculoskeletal ROS (+)   Abdominal (+) + obese,   Peds  Hematology negative hematology ROS (+)   Anesthesia Other Findings   Reproductive/Obstetrics negative OB ROS                         Anesthesia Physical Anesthesia Plan  ASA: III  Anesthesia Plan: General   Post-op Pain Management:    Induction: Intravenous  Airway Management Planned:   Additional Equipment:   Intra-op Plan:   Post-operative Plan: Extubation in OR  Informed Consent: I have reviewed the patients History and Physical, chart, labs and discussed the procedure including the risks, benefits and alternatives for the proposed anesthesia with the patient or authorized representative who has indicated his/her understanding and acceptance.   Dental advisory given  Plan Discussed with: CRNA  Anesthesia Plan Comments:         Anesthesia Quick Evaluation

## 2014-03-28 NOTE — Addendum Note (Signed)
Addendum created 03/28/14 1700 by Gaylan GeroldJohn R Lucilia Yanni, MD   Modules edited: Orders

## 2014-03-28 NOTE — Anesthesia Postprocedure Evaluation (Signed)
Anesthesia Post Note  Patient: Jasmine [name]  Procedure(s) Performed: Procedure(s) (LRB): LAPAROSCOPIC VENTRAL HERNIA (N/A) INSERTION OF MESH (N/A)  Anesthesia type: General  Patient location: PACU  Post pain: Pain level controlled  Post assessment: Post-op Vital signs reviewed  Last Vitals: BP 121/61  Pulse 51  Temp(Src) 36.3 C (Oral)  Resp 13  Ht 5\' 2"  (1.575 m)  Wt 260 lb 5.8 oz (118.1 kg)  BMI 47.61 kg/m2  SpO2 88%  LMP 01/16/2014  Post vital signs: Reviewed  Level of consciousness: sedated  Complications: No apparent anesthesia complications

## 2014-03-28 NOTE — H&P (Signed)
HPI  Jasmine [name] is a 48 y.o. female. I will and the patient is a 48 year old female referred by Dr. Harland GermanHomel for evaluation of umbilical hernia. The patient states that there is been there for several months. She states become more symptomatic with pain. She's not had any signs or symptoms of incarceration or strangulation. He does notice bulge just superior to her umbilicus. She states That it's self reducing.  The patient does have a history of an umbilical hernia repair as a infant.  HPI  Past Medical History   Diagnosis  Date   .  Diabetes mellitus without complication    .  Hypercholesteremia    .  Hypertension    .  Umbilical hernia without mention of obstruction or gangrene    .  Chronic kidney disease      chronic renal insufficiency, stage II (mild)   No past surgical history on file.  Family History   Problem  Relation  Age of Onset   .  Diabetes  Mother    .  Diabetes  Father    .  Cancer  Father      Prostate cancer   .  Cancer  Sister      Cervical cancer   .  Diabetes  Brother    Social History  History   Substance Use Topics   .  Smoking status:  Current Every Day Smoker     Types:  Cigarettes   .  Smokeless tobacco:  Never Used   .  Alcohol Use:  No    Allergies   Allergen  Reactions   .  Eggs Or Egg-Derived Products    .  Metformin And Related  Hives    Current Outpatient Prescriptions   Medication  Sig  Dispense  Refill   .  BD PEN NEEDLE NANO U/F 32G X 4 MM MISC      .  Cetirizine HCl (ZYRTEC PO)  Take by mouth.     .  insulin glargine (LANTUS) 100 UNIT/ML injection  30 Units SQ QHS  10 mL    .  losartan (COZAAR) 25 MG tablet  TAKE 1 TABLET BY MOUTH EVERY DAY  90 tablet  1   .  meloxicam (MOBIC) 15 MG tablet  One tab PO qAM with breakfast for 2 weeks, then daily prn pain.  30 tablet  3   .  NOVOLOG FLEXPEN 100 UNIT/ML FlexPen      .  ONETOUCH DELICA LANCETS FINE MISC      .  phentermine 37.5 MG capsule  Take 1 capsule (37.5 mg total) by mouth every  morning.  30 capsule  0   .  sertraline (ZOLOFT) 50 MG tablet  Take 1 tablet (50 mg total) by mouth daily.  90 tablet  0   .  simvastatin (ZOCOR) 10 MG tablet  Take 10 mg by mouth at bedtime.     .  sitaGLIPtin (JANUVIA) 100 MG tablet  Take 1 tablet (100 mg total) by mouth daily.  30 tablet  3   .  traMADol (ULTRAM) 50 MG tablet  Take 50 mg by mouth every 6 (six) hours as needed.      Current Facility-Administered Medications   Medication  Dose  Route  Frequency  Provider  Last Rate  Last Dose   .  levonorgestrel (MIRENA) 20 MCG/24HR IUD   Intrauterine  Once  Ok EdwardsJuan H Fernandez, MD     Review of Systems  Review  of Systems  Constitutional: Negative.  HENT: Negative.  Respiratory: Negative.  Cardiovascular: Negative.  Gastrointestinal: Negative.  Neurological: Negative.  All other systems reviewed and are negative.  BP 134/61  Pulse 76  Temp(Src) 97.1 F (36.2 C) (Oral)  Resp 18  Ht 5\' 2"  (1.575 m)  Wt 260 lb 5.8 oz (118.1 kg)  BMI 47.61 kg/m2  SpO2 99%  LMP 01/16/2014   Physical Exam  Physical Exam  Constitutional: She is oriented to person, place, and time. She appears well-developed and well-nourished.  HENT:  Head: Normocephalic and atraumatic.  Eyes: Conjunctivae and EOM are normal. Pupils are equal, round, and reactive to light.  Neck: Normal range of motion. Neck supple.  Cardiovascular: Normal rate, regular rhythm and normal heart sounds.  Pulmonary/Chest: Effort normal and breath sounds normal.  Abdominal: Soft. Bowel sounds are normal. A hernia is present. Hernia confirmed positive in the ventral area.    Musculoskeletal: Normal range of motion.  Neurological: She is alert and oriented to person, place, and time.  Skin: Skin is warm and dry.  Psychiatric: She has a normal mood and affect.  Data Reviewed  none  Assessment  48 year old female with possible ventral hernia versus rectus diastases.  Plan  1. We'll proceed with laparoscopic ventral hernia repair with  mesh.  2.All risks and benefits were discussed with the patient, to generally include infection, bleeding, damage to surrounding structures, acute and chronic nerve pain, and recurrence. Alternatives were offered and described. All questions were answered and the patient voiced understanding of the procedure and wishes to proceed at this point.

## 2014-03-28 NOTE — Transfer of Care (Signed)
Immediate Anesthesia Transfer of Care Note  Patient: Jasmine [name]  Procedure(s) Performed: Procedure(s): LAPAROSCOPIC VENTRAL HERNIA (N/A) INSERTION OF MESH (N/A)  Patient Location: PACU  Anesthesia Type:General  Level of Consciousness: awake, alert , oriented and patient cooperative  Airway & Oxygen Therapy: Patient Spontanous Breathing and Patient connected to nasal cannula oxygen  Post-op Assessment: Report given to PACU RN, Post -op Vital signs reviewed and stable and Patient moving all extremities  Post vital signs: Reviewed and stable  Complications: No apparent anesthesia complications

## 2014-03-28 NOTE — Progress Notes (Signed)
Patient still having pain at 7/10.  Dr. Renold DonGermeroth at bedside, orders received.

## 2014-03-28 NOTE — Progress Notes (Signed)
Walked 2015feet with standby assist to bathroom/ stood independently after using BR and walked to chair without issues, reminded of body mechanics and use of abd muscles

## 2014-03-28 NOTE — Progress Notes (Signed)
Patient with significant abdominal pain postoperatively after laparoscopic ventral hernia repair.  Hypoactive bowel sounds.  No peritonitis.  Mild hypoxemia with sats of 90% on 2L.  Will admitt for pain control and oxygen.  Marta LamasJames O. Gae BonWyatt, III, MD, FACS [phone]--pager [phone]--office [facility]

## 2014-03-28 NOTE — Progress Notes (Signed)
Assumed care, observed sitting in chair with family in attendance, interactive and expressive, eating and drinking and does not mention pain unless asked about it.

## 2014-03-29 ENCOUNTER — Encounter (HOSPITAL_COMMUNITY): Payer: Self-pay | Admitting: General Surgery

## 2014-03-29 DIAGNOSIS — K439 Ventral hernia without obstruction or gangrene: Secondary | ICD-10-CM | POA: Diagnosis not present

## 2014-03-29 LAB — HEMOGLOBIN A1C
Hgb A1c MFr Bld: 7.2 % — ABNORMAL HIGH (ref <5.7)
Mean Plasma Glucose: 160 mg/dL — ABNORMAL HIGH (ref <117)

## 2014-03-29 LAB — GLUCOSE, CAPILLARY
GLUCOSE-CAPILLARY: 153 mg/dL — AB (ref 70–99)
Glucose-Capillary: 145 mg/dL — ABNORMAL HIGH (ref 70–99)

## 2014-03-29 MED ORDER — OXYCODONE-ACETAMINOPHEN 5-325 MG PO TABS: 1.0000 | ORAL_TABLET | ORAL | Status: DC | PRN

## 2014-03-29 NOTE — Discharge Summary (Signed)
Physician Discharge Summary  Patient ID: Jasmine [name] MRN: [id number] DOB/AGE: [birth year] 48 y.o.  Admit date: 03/28/2014 Discharge date: 03/29/2014  [facility]  Discharge Diagnoses:  Active Problems:   Ventral hernia, recurrent   Discharged Condition: good  Hospital Course: Pt was admitted post op from the PACU secondary to pain s/p Lap VHR. Pt had pain well controlled overnight.  Tol PO.  SHe was deemed stable for DC and Dc'd home.  Consults: None  Significant Diagnostic Studies: none  Treatments: surgery: as above  Discharge Exam: Blood pressure 111/50, pulse 69, temperature 98.6 F (37 C), temperature source Oral, resp. rate 18, height 5\' 2"  (1.575 m), weight 260 lb 5.8 oz (118.1 kg), last menstrual period 01/16/2014, SpO2 97.00%. General appearance: alert and cooperative GI: s/approp ttp/ nd  Disposition: 01-Home or Self Care  Discharge Instructions   Diet - low sodium heart healthy    Complete by:  As directed      Increase activity slowly    Complete by:  As directed             Medication List         BD PEN NEEDLE NANO U/F 32G X 4 MM Misc  Generic drug:  Insulin Pen Needle     cetirizine 10 MG chewable tablet  Commonly known as:  ZYRTEC  Chew 10 mg by mouth daily.     fluticasone 50 MCG/ACT nasal spray  Commonly known as:  FLONASE  Place 1 spray into both nostrils daily as needed for allergies or rhinitis.     insulin glargine 100 UNIT/ML injection  Commonly known as:  LANTUS  Inject 10 Units into the skin at bedtime.     losartan 25 MG tablet  Commonly known as:  COZAAR  Take 25 mg by mouth daily.     ONETOUCH DELICA LANCETS FINE Misc     oxyCODONE-acetaminophen 5-325 MG per tablet  Commonly known as:  ROXICET  Take 1-2 tablets by mouth every 4 (four) hours as needed.     oxyCODONE-acetaminophen 5-325 MG per tablet  Commonly known as:  ROXICET  Take 1-2 tablets by mouth every 4 (four) hours as needed for severe  pain.     phentermine 37.5 MG capsule  Take 1 capsule (37.5 mg total) by mouth every morning.     sertraline 50 MG tablet  Commonly known as:  ZOLOFT  Take 1 tablet (50 mg total) by mouth daily.     simvastatin 10 MG tablet  Commonly known as:  ZOCOR  Take 10 mg by mouth at bedtime.     sitaGLIPtin 100 MG tablet  Commonly known as:  JANUVIA  Take 1 tablet (100 mg total) by mouth daily.     traMADol 50 MG tablet  Commonly known as:  ULTRAM  Take 50 mg by mouth every 6 (six) hours as needed (for pain).           Follow-up Information   Follow up with Lajean Saveramirez Jr., Liddie Chichester, MD. Schedule an appointment as soon as possible for a visit in 2 weeks. (For wound re-check)    Specialty:  General Surgery   Contact information:   [address] [place] [address] [phone]       Signed: Marigene EhlersRamirez Jr., Jed Limerickrmando 03/29/2014, 8:03 AM

## 2014-03-29 NOTE — Progress Notes (Signed)
Reviewed discharge instructions with pt at the bedside. Pt also had two family members present at the bedside. Gave pt smoking cessation information in her discharge paperwork. Talked with pt about reasons to call the MD, signs and symptoms of infection. Pt receptive and reports she is ready for discharge.

## 2014-04-04 ENCOUNTER — Other Ambulatory Visit (INDEPENDENT_AMBULATORY_CARE_PROVIDER_SITE_OTHER): Payer: Self-pay

## 2014-04-04 DIAGNOSIS — G8918 Other acute postprocedural pain: Secondary | ICD-10-CM

## 2014-04-04 MED ORDER — OXYCODONE-ACETAMINOPHEN 5-325 MG PO TABS: 1.0000 | ORAL_TABLET | Freq: Four times a day (QID) | ORAL | Status: DC | PRN

## 2014-04-12 ENCOUNTER — Encounter (INDEPENDENT_AMBULATORY_CARE_PROVIDER_SITE_OTHER): Payer: 59 | Admitting: General Surgery

## 2014-05-07 ENCOUNTER — Ambulatory Visit (INDEPENDENT_AMBULATORY_CARE_PROVIDER_SITE_OTHER): Payer: 59 | Admitting: Sports Medicine

## 2014-05-07 VITALS — BP 144/90 | HR 85 | Ht 62.0 in | Wt 246.0 lb

## 2014-05-07 DIAGNOSIS — E669 Obesity, unspecified: Secondary | ICD-10-CM

## 2014-05-07 DIAGNOSIS — Z6841 Body Mass Index (BMI) 40.0 and over, adult: Secondary | ICD-10-CM

## 2014-05-07 MED ORDER — PHENTERMINE HCL 37.5 MG PO CAPS: 37.5000 mg | ORAL_CAPSULE | ORAL | Status: DC

## 2014-05-07 NOTE — Assessment & Plan Note (Signed)
20 pound weight loss on phentermine, refilling. Return to see Dr. Ivan Anchors in one month.

## 2014-05-07 NOTE — Progress Notes (Signed)
   Subjective:    Patient ID: Jasmine [name], female    DOB: 02-Feb-1966, 48 y.o.   MRN: [id number]  HPI  Patient reports for  Weight and BP check. She denies chest pain, headache or SOB. Corliss Skains, CMA   Review of Systems     Objective:   Physical Exam        Assessment & Plan:

## 2014-05-10 ENCOUNTER — Other Ambulatory Visit: Payer: Self-pay | Admitting: *Deleted

## 2014-05-10 MED ORDER — SERTRALINE HCL 50 MG PO TABS: 50.0000 mg | ORAL_TABLET | Freq: Every day | ORAL | Status: DC

## 2014-05-10 NOTE — Telephone Encounter (Signed)
Refill given for 30 days with comment that appointment needed for refills especially mood medication. Corliss SkainsJamie Maryann Mccall, CMA

## 2014-05-11 ENCOUNTER — Other Ambulatory Visit: Payer: Self-pay

## 2014-05-11 NOTE — Telephone Encounter (Signed)
Refill sent.

## 2014-05-15 ENCOUNTER — Other Ambulatory Visit: Payer: Self-pay | Admitting: *Deleted

## 2014-05-15 DIAGNOSIS — E118 Type 2 diabetes mellitus with unspecified complications: Secondary | ICD-10-CM

## 2014-05-15 MED ORDER — SERTRALINE HCL 50 MG PO TABS: 50.0000 mg | ORAL_TABLET | Freq: Every day | ORAL | Status: DC

## 2014-05-15 MED ORDER — SITAGLIPTIN PHOSPHATE 100 MG PO TABS
100.0000 mg | ORAL_TABLET | Freq: Every day | ORAL | Status: DC
Start: 2014-05-15 — End: 2015-01-08

## 2014-06-16 ENCOUNTER — Other Ambulatory Visit: Payer: Self-pay | Admitting: Family Medicine

## 2014-06-27 ENCOUNTER — Emergency Department (HOSPITAL_COMMUNITY): Payer: 59

## 2014-06-27 ENCOUNTER — Encounter (HOSPITAL_COMMUNITY): Payer: Self-pay | Admitting: Emergency Medicine

## 2014-06-27 ENCOUNTER — Emergency Department (HOSPITAL_COMMUNITY)
Admission: EM | Admit: 2014-06-27 | Discharge: 2014-06-28 | Disposition: A | Discharge status: Home / Self Care | Payer: 59 | Attending: Emergency Medicine | Admitting: Emergency Medicine

## 2014-06-27 DIAGNOSIS — F329 Major depressive disorder, single episode, unspecified: Secondary | ICD-10-CM | POA: Diagnosis not present

## 2014-06-27 DIAGNOSIS — Z720 Tobacco use: Secondary | ICD-10-CM | POA: Diagnosis not present

## 2014-06-27 DIAGNOSIS — Z7951 Long term (current) use of inhaled steroids: Secondary | ICD-10-CM | POA: Insufficient documentation

## 2014-06-27 DIAGNOSIS — R112 Nausea with vomiting, unspecified: Secondary | ICD-10-CM | POA: Insufficient documentation

## 2014-06-27 DIAGNOSIS — M1990 Unspecified osteoarthritis, unspecified site: Secondary | ICD-10-CM | POA: Insufficient documentation

## 2014-06-27 DIAGNOSIS — R1033 Periumbilical pain: Secondary | ICD-10-CM | POA: Diagnosis not present

## 2014-06-27 DIAGNOSIS — E119 Type 2 diabetes mellitus without complications: Secondary | ICD-10-CM | POA: Diagnosis not present

## 2014-06-27 DIAGNOSIS — Z973 Presence of spectacles and contact lenses: Secondary | ICD-10-CM | POA: Insufficient documentation

## 2014-06-27 DIAGNOSIS — Z79899 Other long term (current) drug therapy: Secondary | ICD-10-CM | POA: Diagnosis not present

## 2014-06-27 DIAGNOSIS — I129 Hypertensive chronic kidney disease with stage 1 through stage 4 chronic kidney disease, or unspecified chronic kidney disease: Secondary | ICD-10-CM | POA: Diagnosis not present

## 2014-06-27 DIAGNOSIS — Z9889 Other specified postprocedural states: Secondary | ICD-10-CM | POA: Insufficient documentation

## 2014-06-27 DIAGNOSIS — Z794 Long term (current) use of insulin: Secondary | ICD-10-CM | POA: Diagnosis not present

## 2014-06-27 DIAGNOSIS — E780 Pure hypercholesterolemia: Secondary | ICD-10-CM | POA: Diagnosis not present

## 2014-06-27 DIAGNOSIS — R109 Unspecified abdominal pain: Secondary | ICD-10-CM

## 2014-06-27 DIAGNOSIS — R1013 Epigastric pain: Secondary | ICD-10-CM | POA: Diagnosis not present

## 2014-06-27 DIAGNOSIS — N182 Chronic kidney disease, stage 2 (mild): Secondary | ICD-10-CM | POA: Insufficient documentation

## 2014-06-27 LAB — CBC WITH DIFFERENTIAL/PLATELET
Basophils Absolute: 0 10*3/uL (ref 0.0–0.1)
Basophils Relative: 1 % (ref 0–1)
EOS ABS: 0.3 10*3/uL (ref 0.0–0.7)
Eosinophils Relative: 5 % (ref 0–5)
HCT: 40 % (ref 36.0–46.0)
HEMOGLOBIN: 12.8 g/dL (ref 12.0–15.0)
Lymphocytes Relative: 53 % — ABNORMAL HIGH (ref 12–46)
Lymphs Abs: 3.7 10*3/uL (ref 0.7–4.0)
MCH: 23.5 pg — AB (ref 26.0–34.0)
MCHC: 32 g/dL (ref 30.0–36.0)
MCV: 73.4 fL — AB (ref 78.0–100.0)
Monocytes Absolute: 0.4 10*3/uL (ref 0.1–1.0)
Monocytes Relative: 6 % (ref 3–12)
NEUTROS PCT: 35 % — AB (ref 43–77)
Neutro Abs: 2.4 10*3/uL (ref 1.7–7.7)
Platelets: 273 10*3/uL (ref 150–400)
RBC: 5.45 MIL/uL — AB (ref 3.87–5.11)
RDW: 16 % — ABNORMAL HIGH (ref 11.5–15.5)
WBC: 6.8 10*3/uL (ref 4.0–10.5)

## 2014-06-27 LAB — URINALYSIS, ROUTINE W REFLEX MICROSCOPIC
BILIRUBIN URINE: NEGATIVE
Glucose, UA: NEGATIVE mg/dL
HGB URINE DIPSTICK: NEGATIVE
Ketones, ur: NEGATIVE mg/dL
Leukocytes, UA: NEGATIVE
Nitrite: NEGATIVE
PROTEIN: NEGATIVE mg/dL
Specific Gravity, Urine: 1.014 (ref 1.005–1.030)
Urobilinogen, UA: 0.2 mg/dL (ref 0.0–1.0)
pH: 5 (ref 5.0–8.0)

## 2014-06-27 LAB — COMPREHENSIVE METABOLIC PANEL
ALK PHOS: 75 U/L (ref 39–117)
ALT: 12 U/L (ref 0–35)
AST: 15 U/L (ref 0–37)
Albumin: 4.4 g/dL (ref 3.5–5.2)
Anion gap: 15 (ref 5–15)
BILIRUBIN TOTAL: 0.2 mg/dL — AB (ref 0.3–1.2)
BUN: 11 mg/dL (ref 6–23)
CO2: 21 mEq/L (ref 19–32)
Calcium: 9.9 mg/dL (ref 8.4–10.5)
Chloride: 102 mEq/L (ref 96–112)
Creatinine, Ser: 1.29 mg/dL — ABNORMAL HIGH (ref 0.50–1.10)
GFR, EST AFRICAN AMERICAN: 57 mL/min — AB (ref >90)
GFR, EST NON AFRICAN AMERICAN: 49 mL/min — AB (ref >90)
Glucose, Bld: 95 mg/dL (ref 70–99)
Potassium: 4.4 mEq/L (ref 3.7–5.3)
Sodium: 138 mEq/L (ref 137–147)
TOTAL PROTEIN: 8.4 g/dL — AB (ref 6.0–8.3)

## 2014-06-27 LAB — LIPASE, BLOOD
Lipase: 480 U/L — ABNORMAL HIGH (ref 11–59)
Lipase: 66 U/L — ABNORMAL HIGH (ref 11–59)

## 2014-06-27 MED ORDER — IOHEXOL 300 MG/ML  SOLN: 50.0000 mL | Freq: Once | INTRAMUSCULAR | Status: AC | PRN

## 2014-06-27 MED ORDER — ONDANSETRON HCL 4 MG/2ML IJ SOLN
4.0000 mg | Freq: Once | INTRAMUSCULAR | Status: AC
  Administered 2014-06-27: 4 mg via INTRAVENOUS
  Filled 2014-06-27: qty 2

## 2014-06-27 MED ORDER — HYDROMORPHONE HCL 1 MG/ML IJ SOLN
1.0000 mg | Freq: Once | INTRAMUSCULAR | Status: AC
  Administered 2014-06-27: 1 mg via INTRAVENOUS
  Filled 2014-06-27: qty 1

## 2014-06-27 MED ORDER — SODIUM CHLORIDE 0.9 % IV BOLUS (SEPSIS)
1000.0000 mL | Freq: Once | INTRAVENOUS | Status: AC
  Administered 2014-06-27: 1000 mL via INTRAVENOUS

## 2014-06-27 MED ORDER — SODIUM CHLORIDE 0.9 % IV BOLUS (SEPSIS)
1000.0000 mL | Freq: Once | INTRAVENOUS | Status: AC
Start: 2014-06-27 — End: 2014-06-27
  Administered 2014-06-27: 1000 mL via INTRAVENOUS

## 2014-06-27 MED ORDER — HYDROMORPHONE HCL 1 MG/ML IJ SOLN
0.5000 mg | Freq: Once | INTRAMUSCULAR | Status: AC
  Administered 2014-06-27: 0.5 mg via INTRAVENOUS
  Filled 2014-06-27: qty 1

## 2014-06-27 NOTE — ED Notes (Signed)
Pt states that she has been having periumbilical pain since surgery for umbilical hernia on 8/19.  Her doctor "hasn't helped".

## 2014-06-27 NOTE — ED Provider Notes (Signed)
CSN: [id number]     Arrival date & time 06/27/14  1429 History   First MD Initiated Contact with Patient 06/27/14 1542     Chief Complaint  Patient presents with  . Abdominal Pain     (Consider location/radiation/quality/duration/timing/severity/associated sxs/prior Treatment) HPI Ms. Jasmine [name] is a 48 year old female with past medical history of hypercholesterolemia, hypertension, umbilical hernia,with ventral hernia repair 03/27/14 who presents to the ER with ongoing abdominal pain in the area of her surgical site. Patient states his pain has been "off and on" since her surgery, and states she has followed up with her general surgeon, however has not brought up this issue. Patient states she has tried taking ibuprofen, and tramadol for her pain, however today, her pain became acutely worse. Patient states since Sunday she is been experiencing nausea and vomiting as well, and states she has had 1-2 nonbilious, nonbloody episodes of vomiting since Sunday. Patient's pain is aggravated with standing, motion, walking, is slightly alleviated by rest. Patient denies associated fever, chills, chest pain, shortness of breath, diarrhea, constipation, dysuria. Pt reports she is currently menopausal.    Past Medical History  Diagnosis Date  . Hypercholesteremia   . Hypertension   . Umbilical hernia without mention of obstruction or gangrene   . Depression   . Seasonal allergies   . Wears glasses   . Numbness and tingling of right arm and leg   . Type II diabetes mellitus   . Arthritis     "knees" (03/28/2014)  . Chronic renal insufficiency     stage II (mild)   Past Surgical History  Procedure Laterality Date  . Multiple tooth extractions  ~ 2003  . Umbilical hernia repair  1973  . Tonsillectomy    . Ventral hernia repair  03/27/2014  . Hernia repair    . Ventral hernia repair N/A 03/28/2014    Procedure: LAPAROSCOPIC VENTRAL HERNIA;  Surgeon: Axel Filler, MD;  Location: [facility] OR;  Service:  General;  Laterality: N/A;  . Insertion of mesh N/A 03/28/2014    Procedure: INSERTION OF MESH;  Surgeon: Axel Filler, MD;  Location: [facility] OR;  Service: General;  Laterality: N/A;   Family History  Problem Relation Age of Onset  . Diabetes Mother   . Diabetes Father   . Cancer Father     Prostate cancer  . Cancer Sister     Cervical cancer  . Diabetes Brother    History  Substance Use Topics  . Smoking status: Current Every Day Smoker -- 0.25 packs/day for 26 years    Types: Cigarettes  . Smokeless tobacco: Never Used  . Alcohol Use: Yes     Comment: 03/28/2014 "have a drink a few times/year"   OB History    Gravida Para Term Preterm AB TAB SAB Ectopic Multiple Living   1 1 1       1      Review of Systems  Constitutional: Negative for fever.  HENT: Negative for trouble swallowing.   Eyes: Negative for visual disturbance.  Respiratory: Negative for shortness of breath.   Cardiovascular: Negative for chest pain.  Gastrointestinal: Positive for nausea, vomiting and abdominal pain. Negative for diarrhea and blood in stool.  Genitourinary: Negative for dysuria, vaginal bleeding, vaginal discharge and vaginal pain.  Musculoskeletal: Negative for neck pain.  Skin: Negative for rash.  Neurological: Negative for dizziness, weakness and numbness.  Psychiatric/Behavioral: Negative.       Allergies  Eggs or egg-derived products and Metformin and related  Home  Medications   Prior to Admission medications   Medication Sig Start Date End Date Taking? Authorizing Provider  cetirizine (ZYRTEC) 10 MG chewable tablet Chew 10 mg by mouth daily.   Yes Historical Provider, MD  fluticasone (FLONASE) 50 MCG/ACT nasal spray Place 1 spray into both nostrils daily as needed for allergies or rhinitis.   Yes Historical Provider, MD  ibuprofen (ADVIL,MOTRIN) 200 MG tablet Take 400 mg by mouth every 6 (six) hours as needed.   Yes Historical Provider, MD  insulin glargine (LANTUS) 100 UNIT/ML  injection Inject 10 Units into the skin at bedtime.  11/27/13  Yes Sean Hommel, DO  losartan (COZAAR) 25 MG tablet TAKE 1 TABLET BY MOUTH EVERY DAY 06/18/14  Yes Sean Hommel, DO  sertraline (ZOLOFT) 50 MG tablet Take 1 tablet (50 mg total) by mouth daily. 05/15/14  Yes Sean Hommel, DO  simvastatin (ZOCOR) 10 MG tablet Take 10 mg by mouth at bedtime.   Yes Historical Provider, MD  sitaGLIPtin (JANUVIA) 100 MG tablet Take 1 tablet (100 mg total) by mouth daily. 05/15/14  Yes Sean Hommel, DO  traMADol (ULTRAM) 50 MG tablet Take 50 mg by mouth every 6 (six) hours as needed (for pain).    Yes Historical Provider, MD  HYDROcodone-acetaminophen (NORCO/VICODIN) 5-325 MG per tablet Take 1-2 tablets by mouth every 6 (six) hours as needed for moderate pain or severe pain. 06/28/14   Monte FantasiaJoseph W Inocencia Murtaugh, PA-C  ondansetron (ZOFRAN) 4 MG tablet Take 1 tablet (4 mg total) by mouth every 6 (six) hours. 06/28/14   Monte FantasiaJoseph W Kadarius Cuffe, PA-C  oxyCODONE-acetaminophen (ROXICET) 5-325 MG per tablet Take 1-2 tablets by mouth every 4 (four) hours as needed. Patient not taking: Reported on 06/27/2014 03/28/14   Axel FillerArmando Ramirez, MD  oxyCODONE-acetaminophen (ROXICET) 5-325 MG per tablet Take 1-2 tablets by mouth every 6 (six) hours as needed for severe pain. Patient not taking: Reported on 06/27/2014 04/04/14   Axel FillerArmando Ramirez, MD  phentermine 37.5 MG capsule Take 1 capsule (37.5 mg total) by mouth every morning. Patient not taking: Reported on 06/27/2014 05/07/14   Monica Bectonhomas J Thekkekandam, MD   BP 128/72 mmHg  Pulse 53  Temp(Src) 98 F (36.7 C) (Oral)  Resp 18  SpO2 96%  LMP 01/24/2014 Physical Exam  Constitutional: She appears well-developed and well-nourished. No distress.  Well appearing, morbidly obese female, sitting upright on stretcher, in no acute distress.  HENT:  Head: Normocephalic and atraumatic.  Mouth/Throat: Oropharynx is clear and moist. No oropharyngeal exudate.  Eyes: Right eye exhibits no discharge. Left eye  exhibits no discharge. No scleral icterus.  Neck: Normal range of motion.  Cardiovascular: Normal rate, regular rhythm and normal heart sounds.   No murmur heard. Pulmonary/Chest: Effort normal and breath sounds normal. No respiratory distress.  Abdominal: Soft. Normal appearance and bowel sounds are normal. There is tenderness in the epigastric area and periumbilical area. There is no rigidity, no guarding, no tenderness at McBurney's point and negative Murphy's sign.  Surgical site laparoscopic, well healed and well appearing. No erythema, edema, discharge, signs of dehiscence, or signs of infection.  Musculoskeletal: Normal range of motion. She exhibits no edema or tenderness.  Neurological: She is alert. She has normal strength. No cranial nerve deficit or sensory deficit. Coordination normal. GCS eye subscore is 4. GCS verbal subscore is 5. GCS motor subscore is 6.  Patient fully alert answer questions appropriately in full, clear sentences.  Skin: Skin is warm and dry. No rash noted. She is not diaphoretic.  Psychiatric: She has a normal mood and affect.  Nursing note and vitals reviewed.   ED Course  Procedures (including critical care time) Labs Review Labs Reviewed  CBC WITH DIFFERENTIAL - Abnormal; Notable for the following:    RBC 5.45 (*)    MCV 73.4 (*)    MCH 23.5 (*)    RDW 16.0 (*)    Neutrophils Relative % 35 (*)    Lymphocytes Relative 53 (*)    All other components within normal limits  COMPREHENSIVE METABOLIC PANEL - Abnormal; Notable for the following:    Creatinine, Ser 1.29 (*)    Total Protein 8.4 (*)    Total Bilirubin 0.2 (*)    GFR calc non Af Amer 49 (*)    GFR calc Af Amer 57 (*)    All other components within normal limits  LIPASE, BLOOD - Abnormal; Notable for the following:    Lipase 480 (*)    All other components within normal limits  LIPASE, BLOOD - Abnormal; Notable for the following:    Lipase 66 (*)    All other components within normal  limits  URINALYSIS, ROUTINE W REFLEX MICROSCOPIC    Imaging Review Ct Abdomen Pelvis Wo Contrast  06/27/2014   CLINICAL DATA:  Progressing periumbilical pain.  EXAM: CT ABDOMEN AND PELVIS WITHOUT CONTRAST  TECHNIQUE: Multidetector CT imaging of the abdomen and pelvis was performed following the standard protocol without IV contrast.  COMPARISON:  CT 02/28/1999 15  FINDINGS: Lower chest:  Lung bases are clear.  No pericardial fluid.  Hepatobiliary: No focal hepatic lesion.  Gallbladder is normal.  Pancreas: Pancreas is normal. No ductal dilatation. No pancreatic inflammation.  Spleen: Normal spleen.  Adrenals/urinary tract: Adrenal glands are normal. Normal kidneys. No ureterolithiasis. No bladder calculi.  Stomach/Bowel: Stomach, small bowel, appendix, cecum are normal. The colon and rectosigmoid colon are normal.  Vascular/Lymphatic: Abdominal aorta is normal caliber. There is no retroperitoneal or periportal lymphadenopathy. No pelvic lymphadenopathy.  Reproductive: Uterus and ovaries are normal. No free fluid the pelvis. No pelvic lymphadenopathy.  Musculoskeletal: No aggressive osseous lesion. There is sclerosis of the SI joints. Degenerate spurring of the spine. 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  Other: There is interval repair ventral hernia superior to the emboli kiss. There is a ventral laxity at site of prior hernia. Superficial to the ventral laxity there is a small focus of inflammation / infection just beneath the skin surface measuring 21 x 20 mm on image 40 and 16 mm on sagittal image 68. This small focus of inflammation is best seen on the sagittal imaging.  IMPRESSION: 1. Small focus of infection or inflammation superficial to the ventral abdominal wall defect approximately 1 cm below beneath the skin surface. This finding is just above the umbilicus at site of prior a ventral hernia repair. 2. Normal appendix. 3. Bilateral pars defects at L5 with grade 1  anterolisthesis.   Electronically Signed   By: Genevive Bi M.D.   On: 06/27/2014 18:59   Dg Abd Acute W/chest  06/27/2014   CLINICAL DATA:  Periumbilical abdominal pain.  EXAM: ACUTE ABDOMEN SERIES (ABDOMEN 2 VIEW & CHEST 1 VIEW)  COMPARISON:  Chest radiograph of March 28, 2014.  FINDINGS: There is no evidence of dilated bowel loops or free intraperitoneal air. No radiopaque calculi or other significant radiographic abnormality is seen. Heart size and mediastinal contours are within normal limits. Both lungs are clear.  IMPRESSION: Negative abdominal radiographs.  No acute  cardiopulmonary disease.   Electronically Signed   By: Roque LiasJames  Green M.D.   On: 06/27/2014 17:18     EKG Interpretation None      MDM   Final diagnoses:  Periumbilical pain  Abdominal pain    Patient is here playing of ongoing pain since a laparoscopic surgery for a ventral hernia repair in 03/2014. Patient stating she has not followed up with her general surgeon for this problem. She states she is here today because her pain increased over the past few days and she has been experiencing nausea and vomiting.   Chest and upright abdomen unremarkable for any acute pathology. Urinalysis unremarkable. Lab work shows no evidence of acute pathology with no leukocytosis, no anemia. Patient's creatinine slightly elevated, which appears to be at baseline from her previous results.  Lipase elevated at 480, will follow up with CT abdomen pelvis.  CT abdomen pelvis with impression: 1. Small focus of infection or inflammation superficial to the ventral abdominal wall defect approximately 1 cm below beneath the skin surface. This finding is just above the umbilicus at site of prior a ventral hernia repair. 2. Normal appendix. 3. 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  Patient complain of pain, will follow up with fluid bolus and pain medication. Consult placed for admission, and [facility] request  that we repeat lipase level to see if this is a false level, or patient does have an elevated lipase.  With pancreas normal on CT, we will repeat lipase to see if this was a true level.  Lipase level decreased to 66. Patient no longer having epigastric pain on palpation, patient pain is all periumbilical. Due the fact the patient's lab work has improved, there is no leukocytosis, anemia, abnormal renal function and patient's CT abdomen pelvis is unremarkable for any acute pathology, we'll have patient follow-up with [facility] trauma surgery[facility] the small focus of infection or inflammation superficial to patient's surgical site. Patient is nontoxic, nonseptic appearing, in no apparent distress.  Patient's pain and other symptoms adequately managed in emergency department.  Fluid bolus given.  Labs, imaging and vitals reviewed.  Patient does not meet the SIRS or Sepsis criteria.  On repeat exam patient does not have a surgical abdomin and there are no peritoneal signs.  No indication of appendicitis, bowel obstruction, bowel perforation, cholecystitis, diverticulitis, PID or ectopic pregnancy.  Patient discharged home with symptomatic treatment and given strict instructions for follow-up with their primary care physician.  I have also discussed reasons to return immediately to the ER.  Patient expresses understanding and agrees with plan.  BP 128/72 mmHg  Pulse 53  Temp(Src) 98 F (36.7 C) (Oral)  Resp 18  SpO2 96%    Signed,  Ladona MowJoe Jeshua Ransford, PA-C 2:04 AM  Patient discussed with Dr. Toy CookeyMegan Docherty, M.D.  Monte FantasiaJoseph W Elias Bordner, PA-C 06/28/14 [id number]  Toy CookeyMegan Docherty, MD 06/28/14 [phone]

## 2014-06-28 MED ORDER — ONDANSETRON HCL 4 MG PO TABS
4.0000 mg | ORAL_TABLET | Freq: Four times a day (QID) | ORAL | Status: DC
Start: 2014-06-28 — End: 2014-12-14

## 2014-06-28 MED ORDER — HYDROCODONE-ACETAMINOPHEN 5-325 MG PO TABS: 1.0000 | ORAL_TABLET | Freq: Four times a day (QID) | ORAL | Status: DC | PRN

## 2014-06-28 NOTE — Discharge Instructions (Signed)
Return to the ER if you develop high fever, severe abdominal pain, severe nausea, vomiting or if your symptoms persist or worsen.  Abdominal Pain Many things can cause abdominal pain. Usually, abdominal pain is not caused by a disease and will improve without treatment. It can often be observed and treated at home. Your health care provider will do a physical exam and possibly order blood tests and X-rays to help determine the seriousness of your pain. However, in many cases, more time must pass before a clear cause of the pain can be found. Before that point, your health care provider may not know if you need more testing or further treatment. HOME CARE INSTRUCTIONS  Monitor your abdominal pain for any changes. The following actions may help to alleviate any discomfort you are experiencing:  Only take over-the-counter or prescription medicines as directed by your health care provider.  Do not take laxatives unless directed to do so by your health care provider.  Try a clear liquid diet (broth, tea, or water) as directed by your health care provider. Slowly move to a bland diet as tolerated. SEEK MEDICAL CARE IF:  You have unexplained abdominal pain.  You have abdominal pain associated with nausea or diarrhea.  You have pain when you urinate or have a bowel movement.  You experience abdominal pain that wakes you in the night.  You have abdominal pain that is worsened or improved by eating food.  You have abdominal pain that is worsened with eating fatty foods.  You have a fever. SEEK IMMEDIATE MEDICAL CARE IF:   Your pain does not go away within 2 hours.  You keep throwing up (vomiting).  Your pain is felt only in portions of the abdomen, such as the right side or the left lower portion of the abdomen.  You pass bloody or black tarry stools. MAKE SURE YOU:  Understand these instructions.   Will watch your condition.   Will get help right away if you are not doing well or  get worse.  Document Released: 05/06/2005 Document Revised: 08/01/2013 Document Reviewed: 04/05/2013 [facility] Patient Information 2015 [place]Care, [place]. This information is not intended to replace advice given to you by your health care provider. Make sure you discuss any questions you have with your health care provider.

## 2014-07-04 ENCOUNTER — Ambulatory Visit: Payer: 59

## 2014-07-12 ENCOUNTER — Ambulatory Visit: Payer: 59

## 2014-07-20 ENCOUNTER — Ambulatory Visit (INDEPENDENT_AMBULATORY_CARE_PROVIDER_SITE_OTHER): Payer: 59 | Admitting: Family Medicine

## 2014-07-20 DIAGNOSIS — R635 Abnormal weight gain: Secondary | ICD-10-CM

## 2014-07-20 NOTE — Progress Notes (Signed)
   Subjective:    Patient ID: Jasmine [name], female    DOB: June 23, 1966, 48 y.o.   MRN: [id number]  HPI  [name] is here for a blood pressure and weight check. She states she has not been able exercise because of hernia surgery 3 months ago. Denies any problems.   Review of Systems     Objective:   Physical Exam        Assessment & Plan:  Obesity - Patient increased in weight. Patient is aware that we can not refill phenermine.

## 2014-08-10 ENCOUNTER — Other Ambulatory Visit: Payer: Self-pay | Admitting: Family Medicine

## 2014-08-14 ENCOUNTER — Other Ambulatory Visit: Payer: Self-pay | Admitting: Family Medicine

## 2014-10-03 ENCOUNTER — Encounter: Payer: Self-pay | Admitting: Family Medicine

## 2014-10-03 ENCOUNTER — Ambulatory Visit (INDEPENDENT_AMBULATORY_CARE_PROVIDER_SITE_OTHER): Payer: 59 | Admitting: Family Medicine

## 2014-10-03 VITALS — BP 136/91 | HR 90 | Wt 262.0 lb

## 2014-10-03 DIAGNOSIS — F32A Depression, unspecified: Secondary | ICD-10-CM

## 2014-10-03 DIAGNOSIS — N189 Chronic kidney disease, unspecified: Secondary | ICD-10-CM

## 2014-10-03 DIAGNOSIS — E1122 Type 2 diabetes mellitus with diabetic chronic kidney disease: Secondary | ICD-10-CM

## 2014-10-03 DIAGNOSIS — M1712 Unilateral primary osteoarthritis, left knee: Secondary | ICD-10-CM

## 2014-10-03 DIAGNOSIS — F329 Major depressive disorder, single episode, unspecified: Secondary | ICD-10-CM

## 2014-10-03 LAB — POCT GLYCOSYLATED HEMOGLOBIN (HGB A1C): HEMOGLOBIN A1C: 8

## 2014-10-03 MED ORDER — TRAMADOL HCL 50 MG PO TABS
50.0000 mg | ORAL_TABLET | Freq: Three times a day (TID) | ORAL | Status: DC | PRN
Start: 2014-10-03 — End: 2016-06-18

## 2014-10-03 MED ORDER — VENLAFAXINE HCL ER 150 MG PO CP24: 150.0000 mg | ORAL_CAPSULE | Freq: Every day | ORAL | Status: DC

## 2014-10-03 MED ORDER — INSULIN GLARGINE 100 UNIT/ML SOLOSTAR PEN: 10.0000 [IU] | PEN_INJECTOR | Freq: Every day | SUBCUTANEOUS | Status: DC

## 2014-10-03 NOTE — Progress Notes (Signed)
CC: Jasmine [name] is a 49 y.o. female is here for Diabetes   Subjective: HPI:  Return of left knee pain she tells me she had months of relief with her last steroid injection. She needs new FMLA paperwork filled out Accomidations needed for left knee. 40 hours a month of leave. Sometimes she'll use 0 hours a month other times she'll use all 40. Symptoms are worse in cold weather or with rain.  Complains of depression that has been worsening over the past 6 months. It's present on a daily basis and now has gotten to a moderate to severe state. She tells me she is irritable towards others, has lost interest in all hobbies, locks herself in her room when she arrives from work and has very little Medication or interactions with family members that live with her. She tells that she constantly feels stressed. She is counseling worrying about little things in life that should not require any attention. No dots worn on herself or others. No change to her Zoloft other than running out of it one week ago she does not feel any better or worse since being off this medication. Review of systems positive for subjective anxiety  Follow-up type 2 diabetes: She admits that with her depression she's been indulging in at least a bag of candy every night before bed if she does not eat that she has difficulty falling asleep. She is only taking Januvia and has not been using Lantus. No outside blood sugars to report. No polyuria plication or polydipsia denies poorly healing wounds     Review Of Systems Outlined In HPI  Past Medical History  Diagnosis Date  . Hypercholesteremia   . Hypertension   . Umbilical hernia without mention of obstruction or gangrene   . Depression   . Seasonal allergies   . Wears glasses   . Numbness and tingling of right arm and leg   . Type II diabetes mellitus   . Arthritis     "knees" (03/28/2014)  . Chronic renal insufficiency     stage II (mild)    Past Surgical History   Procedure Laterality Date  . Multiple tooth extractions  ~ 2003  . Umbilical hernia repair  1973  . Tonsillectomy    . Ventral hernia repair  03/27/2014  . Hernia repair    . Ventral hernia repair N/A 03/28/2014    Procedure: LAPAROSCOPIC VENTRAL HERNIA;  Surgeon: Axel Filler, MD;  Location: [facility];  Service: General;  Laterality: N/A;  . Insertion of mesh N/A 03/28/2014    Procedure: INSERTION OF MESH;  Surgeon: Axel Filler, MD;  Location: [facility] OR;  Service: General;  Laterality: N/A;   Family History  Problem Relation Age of Onset  . Diabetes Mother   . Diabetes Father   . Cancer Father     Prostate cancer  . Cancer Sister     Cervical cancer  . Diabetes Brother     History   Social History  . Marital Status: Married    Spouse Name: N/A  . Number of Children: N/A  . Years of Education: N/A   Occupational History  . Not on file.   Social History Main Topics  . Smoking status: Current Every Day Smoker -- 0.25 packs/day for 26 years    Types: Cigarettes  . Smokeless tobacco: Never Used  . Alcohol Use: Yes     Comment: 03/28/2014 "have a drink a few times/year"  . Drug Use: No  . Sexual  Activity: Not Currently   Other Topics Concern  . Not on file   Social History Narrative     Objective: BP 136/91 mmHg  Pulse 90  Wt 262 lb (118.842 kg)  General: Alert and Oriented, No Acute Distress HEENT: Pupils equal, round, reactive to light. Conjunctivae clear.Moist mucous membranes pharynx unremarkablegs: Clear to auscultation bilaterally, no wheezing/ronchi/rales.  Comfortable work of breathing. Good air movement. Cardiac: Regular rate and rhythm. Normal S1/S2.  No murmurs, rubs, nor gallops.   Extremities: No peripheral edema.  Strong peripheral pulses.  left knee pain alleviated with extension of the knee, no swelling or warmth.  Mental Status:  mild depression and anxiety no agitationin: Warm and dry.  Assessment & Plan: [name] was seen today for  diabetes.  Diagnoses and all orders for this visit:  Type 2 diabetes mellitus with diabetic chronic kidney disease Orders: -     POCT HgB A1C -     Insulin Glargine (LANTUS SOLOSTAR) 100 UNIT/ML Solostar Pen; Inject 10 Units into the skin daily at 10 pm.  Depression Orders: -     venlafaxine XR (EFFEXOR XR) 150 MG 24 hr capsule; Take 1 capsule (150 mg total) by mouth daily with breakfast.  Primary osteoarthritis of left knee  Other orders -     traMADol (ULTRAM) 50 MG tablet; Take 1 tablet (50 mg total) by mouth every 8 (eight) hours as needed.   Type 2 diabetes: A1c of 8.0. I've encouraged her to restart Lantus at 10 units nightly. Continue Januvia. Discussed that we can likely discontinue Lantus if she can break the habit of eating a bag of candy before she goes to bed every night once her depression is controlled.  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 Karie Schwalbe in sports medicine for consideration of injection , tramadol to help mask the pain for now trying to avoid nonsteroidal anti-inflammatories given renal insufficiency.    Return in about 3 months (around 01/01/2015) for Diabetes and Mood.

## 2014-10-09 ENCOUNTER — Encounter: Payer: Self-pay | Admitting: Sports Medicine

## 2014-10-09 ENCOUNTER — Ambulatory Visit (INDEPENDENT_AMBULATORY_CARE_PROVIDER_SITE_OTHER): Payer: 59 | Admitting: Sports Medicine

## 2014-10-09 DIAGNOSIS — M1712 Unilateral primary osteoarthritis, left knee: Secondary | ICD-10-CM | POA: Diagnosis not present

## 2014-10-09 MED ORDER — MELOXICAM 15 MG PO TABS: ORAL_TABLET | ORAL | Status: DC

## 2014-10-09 NOTE — Assessment & Plan Note (Signed)
Nine-month response to previous injection. Repeat injection today. Return as needed. Refilling meloxicam.

## 2014-10-09 NOTE — Progress Notes (Signed)
  Subjective:    CC: Left knee pain  HPI: Left knee osteoarthritis: Nine-month response to the previous injection, now having recurrence of pain, moderate, persistent without radiation, desires repeat interventional treatment.  Past medical history, Surgical history, Family history not pertinant except as noted below, Social history, Allergies, and medications have been entered into the medical record, reviewed, and no changes needed.   Review of Systems: No fevers, chills, night sweats, weight loss, chest pain, or shortness of breath.   Objective:    General: Well Developed, well nourished, and in no acute distress.  Neuro: Alert and oriented x3, extra-ocular muscles intact, sensation grossly intact.  HEENT: Normocephalic, atraumatic, pupils equal round reactive to light, neck supple, no masses, no lymphadenopathy, thyroid nonpalpable.  Skin: Warm and dry, no rashes. Cardiac: Regular rate and rhythm, no murmurs rubs or gallops, no lower extremity edema.  Respiratory: Clear to auscultation bilaterally. Not using accessory muscles, speaking in full sentences. Left Knee: Normal to inspection with no erythema or effusion or obvious bony abnormalities. Palpation normal with no warmth or joint line tenderness or patellar tenderness or condyle tenderness. ROM normal in flexion and extension and lower leg rotation. Ligaments with solid consistent endpoints including ACL, PCL, LCL, MCL. Negative Mcmurray's and provocative meniscal tests. Non painful patellar compression. Patellar and quadriceps tendons unremarkable. Hamstring and quadriceps strength is normal.  Procedure: Real-time Ultrasound Guided Injection of left knee Device: GE Logiq E  Verbal informed consent obtained.  Time-out conducted.  Noted no overlying erythema, induration, or other signs of local infection.  Skin prepped in a sterile fashion.  Local anesthesia: Topical Ethyl chloride.  With sterile technique and under real  time ultrasound guidance:  2 mL kenalog 40, 4 mL lidocaine injected easily into the suprapatellar recess. Completed without difficulty  Pain immediately resolved suggesting accurate placement of the medication.  Advised to call if fevers/chills, erythema, induration, drainage, or persistent bleeding.  Images permanently stored and available for review in the ultrasound unit.  Impression: Technically successful ultrasound guided injection.  Impression and Recommendations:

## 2014-10-11 ENCOUNTER — Telehealth: Payer: Self-pay | Admitting: Family Medicine

## 2014-10-11 DIAGNOSIS — Z0289 Encounter for other administrative examinations: Secondary | ICD-10-CM

## 2014-10-11 NOTE — Telephone Encounter (Signed)
Started 2:50pm Finished 3:00pm

## 2014-10-13 ENCOUNTER — Other Ambulatory Visit: Payer: Self-pay | Admitting: Family Medicine

## 2014-10-14 NOTE — Telephone Encounter (Signed)
Historical provider.  

## 2014-11-19 ENCOUNTER — Telehealth: Payer: Self-pay | Admitting: *Deleted

## 2014-11-22 NOTE — Telephone Encounter (Signed)
closed

## 2014-11-28 ENCOUNTER — Ambulatory Visit (INDEPENDENT_AMBULATORY_CARE_PROVIDER_SITE_OTHER): Payer: 59 | Admitting: Family Medicine

## 2014-11-28 ENCOUNTER — Encounter: Payer: Self-pay | Admitting: Family Medicine

## 2014-11-28 VITALS — BP 137/89 | HR 81 | Wt 262.0 lb

## 2014-11-28 DIAGNOSIS — N189 Chronic kidney disease, unspecified: Secondary | ICD-10-CM

## 2014-11-28 DIAGNOSIS — E1122 Type 2 diabetes mellitus with diabetic chronic kidney disease: Secondary | ICD-10-CM | POA: Diagnosis not present

## 2014-11-28 DIAGNOSIS — F329 Major depressive disorder, single episode, unspecified: Secondary | ICD-10-CM | POA: Diagnosis not present

## 2014-11-28 DIAGNOSIS — F32A Depression, unspecified: Secondary | ICD-10-CM

## 2014-11-28 MED ORDER — VENLAFAXINE HCL ER 37.5 MG PO CP24: 37.5000 mg | ORAL_CAPSULE | Freq: Every day | ORAL | Status: DC

## 2014-11-28 MED ORDER — VILAZODONE HCL 10 & 20 MG PO KIT: 1.0000 | PACK | Freq: Every day | ORAL | Status: DC

## 2014-11-28 NOTE — Progress Notes (Signed)
CC: Jasmine [name] is a 49 y.o. female is here for f/u depression   Subjective: HPI:  Follow-up depression: She began taking Effexor and after one month she tells me she was starting to feel like it was doing a pretty good job at stabilizing her mood without any significant subjective depression however she began to develop lightheadedness, frightening dreams, hot flashes, and panic attacks with irritability and she expanded by stopping the medication. Symptoms resolved 100% however she began to get migraines that were relieved by restarting the 150 mg formulation of Effexor but taking it every other day. Since then side effects of been manageable but present and depression has been present to a moderate degree. She is having moderate irritability towards others as well since restarting the Effexor. Denies suicidal or homicidal thoughts.  Follow-up type 2 diabetes: She had to stop taking Lantus a few weeks ago because she could no longer afford it. She is having some financial difficulty due to frequent missed days at work. She continued to take Januvia. No outside blood sugars report no polyuria polyphagia or polydipsia   Review Of Systems Outlined In HPI  Past Medical History  Diagnosis Date  . Hypercholesteremia   . Hypertension   . Umbilical hernia without mention of obstruction or gangrene   . Depression   . Seasonal allergies   . Wears glasses   . Numbness and tingling of right arm and leg   . Type II diabetes mellitus   . Arthritis     "knees" (03/28/2014)  . Chronic renal insufficiency     stage II (mild)    Past Surgical History  Procedure Laterality Date  . Multiple tooth extractions  ~ 2003  . Umbilical hernia repair  1973  . Tonsillectomy    . Ventral hernia repair  03/27/2014  . Hernia repair    . Ventral hernia repair N/A 03/28/2014    Procedure: LAPAROSCOPIC VENTRAL HERNIA;  Surgeon: Ralene Ok, MD;  Location: [facility];  Service: General;  Laterality: N/A;  .  Insertion of mesh N/A 03/28/2014    Procedure: INSERTION OF MESH;  Surgeon: Ralene Ok, MD;  Location: [facility];  Service: General;  Laterality: N/A;   Family History  Problem Relation Age of Onset  . Diabetes Mother   . Diabetes Father   . Cancer Father     Prostate cancer  . Cancer Sister     Cervical cancer  . Diabetes Brother     History   Social History  . Marital Status: Married    Spouse Name: N/A  . Number of Children: N/A  . Years of Education: N/A   Occupational History  . Not on file.   Social History Main Topics  . Smoking status: Current Every Day Smoker -- 0.25 packs/day for 26 years    Types: Cigarettes  . Smokeless tobacco: Never Used  . Alcohol Use: Yes     Comment: 03/28/2014 "have a drink a few times/year"  . Drug Use: No  . Sexual Activity: Not Currently   Other Topics Concern  . Not on file   Social History Narrative     Objective: BP 137/89 mmHg  Pulse 81  Wt 262 lb (118.842 kg)  Vital signs reviewed. General: Alert and Oriented, No Acute Distress HEENT: Pupils equal, round, reactive to light. Conjunctivae clear.  External ears unremarkable.  Moist mucous membranes. Lungs: Clear and comfortable work of breathing, speaking in full sentences without accessory muscle use. Cardiac: Regular rate and  rhythm.  Neuro: CN II-XII grossly intact, gait normal. Extremities: No peripheral edema.  Strong peripheral pulses.  Mental Status: mild depression. No anxiety, nor agitation. Logical though process. Skin: Warm and dry.  Assessment & Plan: [name] was seen today for f/u depression.  Diagnoses and all orders for this visit:  Depression Orders: -     Vilazodone HCl (VIIBRYD STARTER PACK) 10 & 20 MG KIT; Take 1 tablet by mouth daily.  Type 2 diabetes mellitus with diabetic chronic kidney disease  Other orders -     venlafaxine XR (EFFEXOR XR) 37.5 MG 24 hr capsule; Take 1 capsule (37.5 mg total) by mouth daily with breakfast. Only for five  days.   Depression: Uncontrolled chronic condition discussed 5 days of low-dose Effexor XR followed by starting viibryd starter kit. Savings voucher was provided for the starter kit. Type 2 diabetes: Uncontrolled off of Lantus she was given 2 pens of Lantus today out of the sample refrigerator to last her until her finances get sorted out.   Return in about 4 weeks (around 12/26/2014) for mood.

## 2014-12-10 ENCOUNTER — Telehealth: Payer: Self-pay | Admitting: *Deleted

## 2014-12-10 MED ORDER — VENLAFAXINE HCL ER 150 MG PO CP24: 150.0000 mg | ORAL_CAPSULE | Freq: Every day | ORAL | Status: DC

## 2014-12-10 NOTE — Telephone Encounter (Signed)
Pt states the viibryd is too expensive and wants to go back to effexor.

## 2014-12-10 NOTE — Telephone Encounter (Signed)
Rx sent to [facility] in [place]

## 2014-12-12 ENCOUNTER — Telehealth: Payer: Self-pay | Admitting: *Deleted

## 2014-12-12 MED ORDER — SERTRALINE HCL 100 MG PO TABS: ORAL_TABLET | ORAL | Status: DC

## 2014-12-12 NOTE — Telephone Encounter (Signed)
[facility], do not take effexor sent there from yesterday's request.

## 2014-12-12 NOTE — Telephone Encounter (Signed)
Pt left a vm stating she wanted to go back on zoloft.

## 2014-12-12 NOTE — Telephone Encounter (Signed)
Message left on vm 

## 2014-12-14 ENCOUNTER — Emergency Department (INDEPENDENT_AMBULATORY_CARE_PROVIDER_SITE_OTHER)
Admission: EM | Admit: 2014-12-14 | Discharge: 2014-12-14 | Disposition: A | Discharge status: Home / Self Care | Payer: 59 | Source: Home / Self Care | Attending: Emergency Medicine | Admitting: Emergency Medicine

## 2014-12-14 ENCOUNTER — Encounter: Payer: Self-pay | Admitting: *Deleted

## 2014-12-14 ENCOUNTER — Telehealth: Payer: Self-pay

## 2014-12-14 ENCOUNTER — Ambulatory Visit: Payer: 59 | Admitting: Family Medicine

## 2014-12-14 DIAGNOSIS — B370 Candidal stomatitis: Secondary | ICD-10-CM

## 2014-12-14 DIAGNOSIS — R350 Frequency of micturition: Secondary | ICD-10-CM

## 2014-12-14 DIAGNOSIS — R358 Other polyuria: Secondary | ICD-10-CM

## 2014-12-14 DIAGNOSIS — IMO0002 Reserved for concepts with insufficient information to code with codable children: Secondary | ICD-10-CM

## 2014-12-14 DIAGNOSIS — E1122 Type 2 diabetes mellitus with diabetic chronic kidney disease: Secondary | ICD-10-CM | POA: Diagnosis not present

## 2014-12-14 DIAGNOSIS — R3589 Other polyuria: Secondary | ICD-10-CM

## 2014-12-14 DIAGNOSIS — E1165 Type 2 diabetes mellitus with hyperglycemia: Secondary | ICD-10-CM

## 2014-12-14 DIAGNOSIS — N189 Chronic kidney disease, unspecified: Secondary | ICD-10-CM

## 2014-12-14 LAB — POCT URINALYSIS DIP (MANUAL ENTRY)
Bilirubin, UA: NEGATIVE
Glucose, UA: 500
Leukocytes, UA: NEGATIVE
Nitrite, UA: NEGATIVE
Spec Grav, UA: 1.025 (ref 1.005–1.03)
Urobilinogen, UA: 0.2 (ref 0–1)
pH, UA: 5 (ref 5–8)

## 2014-12-14 LAB — POCT FASTING CBG KUC MANUAL ENTRY: POCT Glucose (KUC): 357 mg/dL — AB (ref 70–99)

## 2014-12-14 MED ORDER — FLUCONAZOLE 100 MG PO TABS: ORAL_TABLET | ORAL | Status: DC

## 2014-12-14 MED ORDER — CEPHALEXIN 250 MG PO CAPS: 250.0000 mg | ORAL_CAPSULE | Freq: Three times a day (TID) | ORAL | Status: DC

## 2014-12-14 MED ORDER — GLUCOSE BLOOD VI STRP: ORAL_STRIP | Status: DC

## 2014-12-14 NOTE — Discharge Instructions (Signed)
Increase your Lantus to 20 at bedtime. Drink plenty of water Take medications as prescribed. We are sending off a urine culture.  Call the doctor on call or go to emergency room if you have any severe or worsening symptoms or if home blood sugars go over 450.  Thrush, Adult  Ginette Pitmanhrush is an infection that can happen on the mouth, throat, tongue, or other areas. It causes white patches to form on the mouth and tongue. HOME CARE  Only take medicine as told by your doctor. You may be given medicine to swallow or to apply right on the area.  Eat plain yogurt that contains live cultures (check the label).  Rinse your mouth many times a day with a warm saltwater rinse. To make the rinse, mix 1 teaspoon (6 g) of salt in 8 ounces (0.2 L) of warm water. To reduce pain:  Drink cold liquids such as water or iced tea.  Eat frozen ice pops or frozen juices.  Eat foods that are easy to swallow, such as gelatin or ice cream.  Drink from a straw if the patches are painful. If you are breastfeeding:  Clean your nipples with an antifungal medicine.  Dry your nipples after breastfeeding.  Use an ointment called lanolin to help relieve nipple soreness. If you wear dentures:  Take out your dentures before going to bed.  Brush them thoroughly.  Soak them in a denture cleaner. GET HELP IF:   Your problems are getting worse.  Your problems are not improving within 7 days of starting treatment.  Your infection is spreading. This may show as white patches on the skin outside of your mouth.  You are nursing and have redness and pain in the nipples. MAKE SURE YOU:  Understand these instructions.  Will watch your condition.  Will get help right away if you are not doing well or get worse. Document Released: 10/21/2009 Document Revised: 05/17/2013 Document Reviewed: 02/27/2013 [facility] Patient Information 2015 [place], [place]. This information is not intended to replace advice given to you by  your health care provider. Make sure you discuss any questions you have with your health care provider.

## 2014-12-14 NOTE — Telephone Encounter (Signed)
Left message on vm

## 2014-12-14 NOTE — Telephone Encounter (Signed)
Jasmine [name] complains of: hyperglycemia , increased thurst and increased urination. Symptoms have gradually worsened. Patient denies increase appetite. Home blood sugars: BGs range between 489 and 586. She increased her Lantus to 20 units at night. She was advised to go to the Urgent Care. She refused. She states she may need to find a different medical home since she can never be seen when she calls. Please advise.

## 2014-12-14 NOTE — ED Notes (Addendum)
Pt c/o 2 weeks of tongue pain with white coating, polyuria and blurry vision. She is a diabetic on Januvia and Lantus 10units qhs. For the past 2 dasy she increased it to 20units. Her last blood sugar at home 2 days ago was 300-400. Currently her fasting CBG is 357. She is a patient of Dr. Ivan AnchorsHommel, he could not see her today for assessment of glucose. I scheduled a followup for 12/18/14 @ 945am.

## 2014-12-14 NOTE — ED Provider Notes (Addendum)
CSN: [id number]     Arrival date & time 12/14/14  1119 History   First MD Initiated Contact with Patient 12/14/14 1215    [facility] Chief Complaint  Patient presents with  . Hyperglycemia   Today is Friday 12/14/14. Patient called her PCP office requesting to be seen, no appointments available there, so patient was advised by PCP to come to urgent care. HPI Multiple complaints. Complains of two weeks of polyuria, polydipsia, blurred vision, she feels this is all from uncontrolled type II diabetes. I carefully reviewed her medication history including Januvia 100 q d and Lantus 10  QHS.--Two days ago, nonfasting home blood sugars in the 450 to 500 range, so she increased the Lantus to 20 QHS, and blood sugars have decreased somewhat over the past two days, in the 350 range today.  Denies nausea or vomiting or lightheadedness or syncope or any new focal neurologic symptoms. Denies any cardiorespiratory symptoms. Other acute complaints include dry mouth and a painful whitish gray coating on tongue.-she took one Diflucan orally a couple days ago, and that helped a little bit.    + dysuria + frequency + urgency No hematuria No vaginal discharge No fever/chills No lower abdominal pain + fatigue She denies chance of pregnancy.  Past Medical History  Diagnosis Date  . Hypercholesteremia   . Hypertension   . Umbilical hernia without mention of obstruction or gangrene   . Depression   . Seasonal allergies   . Wears glasses   . Numbness and tingling of right arm and leg   . Type II diabetes mellitus   . Arthritis     "knees" (03/28/2014)  . Chronic renal insufficiency     stage II (mild)   Past Surgical History  Procedure Laterality Date  . Multiple tooth extractions  ~ 2003  . Umbilical hernia repair  1973  . Tonsillectomy    . Ventral hernia repair  03/27/2014  . Hernia repair    . Ventral hernia repair N/A 03/28/2014    Procedure: LAPAROSCOPIC VENTRAL HERNIA;  Surgeon:  Axel Filler, MD;  Location: [facility] OR;  Service: General;  Laterality: N/A;  . Insertion of mesh N/A 03/28/2014    Procedure: INSERTION OF MESH;  Surgeon: Axel Filler, MD;  Location: [facility] OR;  Service: General;  Laterality: N/A;   Family History  Problem Relation Age of Onset  . Diabetes Mother   . Diabetes Father   . Cancer Father     Prostate cancer  . Cancer Sister     Cervical cancer  . Diabetes Brother    History  Substance Use Topics  . Smoking status: Current Every Day Smoker -- 0.25 packs/day for 26 years    Types: Cigarettes  . Smokeless tobacco: Never Used  . Alcohol Use: Yes     Comment: 03/28/2014 "have a drink a few times/year"   OB History    Gravida Para Term Preterm AB TAB SAB Ectopic Multiple Living   Review of Systems  Allergies  Effexor; Eggs or egg-derived products; and Metformin and related  Home Medications   Prior to Admission medications   Medication Sig Start Date End Date Taking? Authorizing Provider  cephALEXin (KEFLEX) 250 MG capsule Take 1 capsule (250 mg total) by mouth 3 (three) times daily. For 7 days 12/14/14   Lajean Manes, MD  cetirizine (ZYRTEC) 10 MG chewable tablet Chew 10 mg by mouth daily.  Historical Provider, MD  fluconazole (DIFLUCAN) 100 MG tablet Take 1 by mouth daily for 7 days-- for thrush and yeast infection 12/14/14   Lajean Manesavid Massey, MD  glucose blood test strip One Touch Ultra test strips. Check blood sugar 3 times daily. Diagnosis - Diabetes ICD 10 - E 11.22 12/14/14   Laren BoomSean Hommel, DO  Insulin Glargine (LANTUS SOLOSTAR) 100 UNIT/ML Solostar Pen Inject 10 Units into the skin daily at 10 pm. 10/03/14   Sean Hommel, DO  losartan (COZAAR) 25 MG tablet TAKE 1 TABLET BY MOUTH EVERY DAY 06/18/14   Sean Hommel, DO  meloxicam (MOBIC) 15 MG tablet One tab PO qAM with breakfast for 2 weeks, then daily prn pain. 10/09/14   Monica Bectonhomas J Thekkekandam, MD  sertraline (ZOLOFT) 100 MG tablet Half tab by mouth daily, may increase to full  tab if ineffective after two weeks. 12/12/14   Sean Hommel, DO  simvastatin (ZOCOR) 10 MG tablet TAKE 1 TABLET EVERY EVENING 10/15/14   Sean Hommel, DO  sitaGLIPtin (JANUVIA) 100 MG tablet Take 1 tablet (100 mg total) by mouth daily. 05/15/14   Sean Hommel, DO  traMADol (ULTRAM) 50 MG tablet Take 1 tablet (50 mg total) by mouth every 8 (eight) hours as needed. 10/03/14   Sean Hommel, DO   BP 137/81 mmHg  Pulse 81  Temp(Src) 97.5 F (36.4 C) (Oral)  Resp 16  Ht 5\' 2"  (1.575 m)  Wt 255 lb (115.667 kg)  BMI 46.63 kg/m2  SpO2 97%  LMP 11/19/2014 Physical Exam  Constitutional: She is oriented to person, place, and time. She appears well-developed and well-nourished. No distress.  HENT:  Mouth/Throat: No posterior oropharyngeal edema or posterior oropharyngeal erythema.  Whitish gray coating on tongue, consistent with thrush  Eyes: No scleral icterus.  Neck: Neck supple.  Cardiovascular: Normal rate, regular rhythm and normal heart sounds.   Pulmonary/Chest: Breath sounds normal. No respiratory distress.  Abdominal: Soft. She exhibits no mass. There is no hepatosplenomegaly. There is tenderness (mild) in the suprapubic area. There is no rebound, no guarding and no CVA tenderness.  Lymphadenopathy:    She has no cervical adenopathy.  Neurological: She is alert and oriented to person, place, and time. No cranial nerve deficit.  Skin: Skin is warm and dry.  Nursing note and vitals reviewed.   ED Course  Procedures  Labs Review Labs Reviewed  POCT FASTING CBG [facility] MANUAL ENTRY - Abnormal; Notable for the following:    POCT Glucose (KUC) 357 (*)    All other components within normal limits  URINE CULTURE   Narrative:    Performed at:  [facility]                [address], [address]                [place], [place] [id number]  POCT URINALYSIS DIP (MANUAL ENTRY)   Results for orders placed or performed during the hospital encounter of 12/14/14  Urine culture  Result Value Ref  Range   Colony Count >=100,000 COLONIES/ML    Organism ID, Bacteria Multiple bacterial morphotypes present, none    Organism ID, Bacteria predominant. Suggest appropriate recollection if     Organism ID, Bacteria clinically indicated.   POCT fasting CBG (manual entry)  Result Value Ref Range   POCT Glucose ([facility]) 357 (A) 70 - 99 mg/dL  POCT urinalysis dipstick (new)  Result Value Ref Range   Color, UA yellow    Clarity, UA clear  Glucose, UA =500    Bilirubin, UA negative    Bilirubin, UA trace (5)    Spec Grav, UA 1.025 1.005 - 1.03   Blood, UA trace-lysed    pH, UA 5.0 5 - 8   Protein Ur, POC trace    Urobilinogen, UA 0.2 0 - 1   Nitrite, UA Negative    Leukocytes, UA Negative    MDM   1. Polyuria   2. Urinary frequency   3. Thrush, oral   4. Uncontrolled type 2 diabetes mellitus with chronic kidney disease    There are no ketones in UA. Clinically, no evidence of DKA. Also,possible UTI. --urine culture sent.   By history, might also have vaginal yeast infection.  Treatment options discussed, as well as risks, benefits, alternatives. Patient voiced understanding and agreement with the following plans: Discharge Medication List as of 12/14/2014 12:53 PM    START taking these medications   Details  cephALEXin (KEFLEX) 250 MG capsule Take 1 capsule (250 mg total) by mouth 3 (three) times daily. For 7 days, Starting 12/14/2014, Until Discontinued, Normal    fluconazole (DIFLUCAN) 100 MG tablet Take 1 by mouth daily for 7 days-- for thrush and yeast infection, Normal      continue other current meds, including higher dose of Lantus 20 QHS, but precautions discussed. Watch out for any hypoglycemia symptoms. push po water. Other symptomatic care discussed. She will check home blood sugars frequently, red flags and parameters discussed. Go to emergency room if any red flags.  We made appointment for patient to follow up with PCP, Dr H in 4 days.-Tuesday 9 AM  See detailed  Instructions in AVS, which were given to patient.  Over 40 minutes spent, greater than 50% of the time spent for counseling and coordination of care.  Verbal instructions also given.  Questions invited and answered. Patient voiced understanding and agreement with plans.   Lajean Manesavid Massey, MD 12/16/14 [id number]  Lajean Manesavid Massey, MD 12/16/14 2218

## 2014-12-14 NOTE — Telephone Encounter (Signed)
I would recommend increasing lantus by 2 units every two days until fasting blood sugars are less than 120. F/U with me next available appointment slot.

## 2014-12-16 LAB — URINE CULTURE: Colony Count: >=100000

## 2014-12-18 ENCOUNTER — Ambulatory Visit (INDEPENDENT_AMBULATORY_CARE_PROVIDER_SITE_OTHER): Payer: Self-pay | Admitting: Family Medicine

## 2014-12-18 ENCOUNTER — Encounter: Payer: Self-pay | Admitting: Family Medicine

## 2014-12-18 DIAGNOSIS — Z0289 Encounter for other administrative examinations: Secondary | ICD-10-CM

## 2014-12-18 NOTE — Progress Notes (Signed)
No show 12/18/2014

## 2014-12-26 ENCOUNTER — Ambulatory Visit (INDEPENDENT_AMBULATORY_CARE_PROVIDER_SITE_OTHER): Payer: Self-pay | Admitting: Family Medicine

## 2014-12-26 ENCOUNTER — Encounter: Payer: Self-pay | Admitting: Family Medicine

## 2014-12-26 DIAGNOSIS — F329 Major depressive disorder, single episode, unspecified: Secondary | ICD-10-CM

## 2014-12-26 NOTE — Progress Notes (Signed)
No show 12/26/2014

## 2014-12-28 ENCOUNTER — Other Ambulatory Visit: Payer: Self-pay | Admitting: Family Medicine

## 2015-01-02 ENCOUNTER — Other Ambulatory Visit: Payer: Self-pay | Admitting: Family Medicine

## 2015-01-04 ENCOUNTER — Telehealth: Payer: Self-pay | Admitting: Family Medicine

## 2015-01-04 NOTE — Telephone Encounter (Signed)
Began PA for Januvia. Submitted information via CoverMyMeds

## 2015-01-08 ENCOUNTER — Other Ambulatory Visit: Payer: Self-pay | Admitting: Family Medicine

## 2015-01-08 ENCOUNTER — Telehealth: Payer: Self-pay | Admitting: *Deleted

## 2015-01-08 DIAGNOSIS — E118 Type 2 diabetes mellitus with unspecified complications: Secondary | ICD-10-CM

## 2015-01-08 DIAGNOSIS — E1122 Type 2 diabetes mellitus with diabetic chronic kidney disease: Secondary | ICD-10-CM

## 2015-01-08 MED ORDER — INSULIN GLARGINE 100 UNIT/ML SOLOSTAR PEN: 10.0000 [IU] | PEN_INJECTOR | Freq: Every day | SUBCUTANEOUS | Status: DC

## 2015-01-08 MED ORDER — SITAGLIPTIN PHOSPHATE 100 MG PO TABS: 100.0000 mg | ORAL_TABLET | Freq: Every day | ORAL | Status: DC

## 2015-01-08 NOTE — Telephone Encounter (Signed)
Pt left a message that she needed refills on her medication. It sounded like she started to say losartan but then she started talking about the lantus. Not clear as to what she needs. Also she states her sugars have been running around 450 x 3 weeks. She said that a medication keeps being denied by the pharm but again I am unclear as to what she is referring to. Dr. Ivan AnchorsHommel sent in Losartan on 5/20 with a message to f/u and lantus was sent in in feb with 5 refills which should last her until July. If she was trying to refil the lantus early due to increase on 5/6 then this may be why the pharm denied this medication if that is what she is referring to. In her message she states the communication is poor with our office and she can't keep coming into the office to pay co pays when she was just seen. She states in her message that she may have to find a new doctor. Called pt back and left a message to please call and clarify exactly what she needs  and reminded her that she did no show her last two appointments with Hommel. On the message I did say that she does need to schedule an appt. Advised that if we need to send in medication just enough until she can be seen then this may be reasonable since she is having trouble with her blood sugar but to please call and clarify. Please advise if there is anything I need to tell the patient

## 2015-01-08 NOTE — Telephone Encounter (Signed)
Refills for Januvia and Lantus have been sent to [facility] on [address], no additional advice beyond what you already gave to her.  Timely follow up encouraged.

## 2015-01-08 NOTE — Telephone Encounter (Signed)
Received fax from [facility] and they denied coverage on Januvia because pharmacy benefit plan does not cover additional quantities of requested drug. - CF

## 2015-01-09 ENCOUNTER — Encounter: Payer: Self-pay | Admitting: Family Medicine

## 2015-01-09 ENCOUNTER — Ambulatory Visit (INDEPENDENT_AMBULATORY_CARE_PROVIDER_SITE_OTHER): Payer: 59 | Admitting: Family Medicine

## 2015-01-09 VITALS — BP 153/96 | HR 97 | Wt 244.0 lb

## 2015-01-09 DIAGNOSIS — N189 Chronic kidney disease, unspecified: Secondary | ICD-10-CM

## 2015-01-09 DIAGNOSIS — I10 Essential (primary) hypertension: Secondary | ICD-10-CM

## 2015-01-09 DIAGNOSIS — E1122 Type 2 diabetes mellitus with diabetic chronic kidney disease: Secondary | ICD-10-CM

## 2015-01-09 MED ORDER — INSULIN GLARGINE 100 UNIT/ML SOLOSTAR PEN: 60.0000 [IU] | PEN_INJECTOR | Freq: Every day | SUBCUTANEOUS | Status: DC

## 2015-01-09 MED ORDER — INSULIN PEN NEEDLE 31G X 5 MM MISC
Status: DC
Start: 2015-01-09 — End: 2016-06-16

## 2015-01-09 MED ORDER — FLUCONAZOLE 150 MG PO TABS: ORAL_TABLET | ORAL | Status: AC

## 2015-01-09 MED ORDER — LOSARTAN POTASSIUM 50 MG PO TABS: 50.0000 mg | ORAL_TABLET | Freq: Every day | ORAL | Status: DC

## 2015-01-09 NOTE — Progress Notes (Signed)
CC: Jasmine [name] is a 49 y.o. female is here for Diabetes and Hypertension   Subjective: HPI:  Follow-up type 2 diabetes: Using 10 units of Lantus nightly, Januvia daily, ran out of Januvia the last 7 days but restarted today. Blood sugars ranging from 400 and above. Blurry bilateral vision mild to moderate in severity. Having to urinate every 1-2 hours. Constantly thirsty. No chest pain shortness of breath orthopnea nor peripheral edema. White film in the mouth and also vaginal itching. Symptoms have been present for the last months.   Follow-up essential hypertension: No outside blood pressures report. Taking losartan 25 mg daily without any motor or sensory disturbances or limb claudication.   Review Of Systems Outlined In HPI  Past Medical History  Diagnosis Date  . Hypercholesteremia   . Hypertension   . Umbilical hernia without mention of obstruction or gangrene   . Depression   . Seasonal allergies   . Wears glasses   . Numbness and tingling of right arm and leg   . Type II diabetes mellitus   . Arthritis     "knees" (03/28/2014)  . Chronic renal insufficiency     stage II (mild)    Past Surgical History  Procedure Laterality Date  . Multiple tooth extractions  ~ 2003  . Umbilical hernia repair  1973  . Tonsillectomy    . Ventral hernia repair  03/27/2014  . Hernia repair    . Ventral hernia repair N/A 03/28/2014    Procedure: LAPAROSCOPIC VENTRAL HERNIA;  Surgeon: Axel Filler, MD;  Location: [facility] OR;  Service: General;  Laterality: N/A;  . Insertion of mesh N/A 03/28/2014    Procedure: INSERTION OF MESH;  Surgeon: Axel Filler, MD;  Location: [facility] OR;  Service: General;  Laterality: N/A;   Family History  Problem Relation Age of Onset  . Diabetes Mother   . Diabetes Father   . Cancer Father     Prostate cancer  . Cancer Sister     Cervical cancer  . Diabetes Brother     History   Social History  . Marital Status: Married    Spouse Name: N/A  . Number  of Children: N/A  . Years of Education: N/A   Occupational History  . Not on file.   Social History Main Topics  . Smoking status: Current Every Day Smoker -- 0.25 packs/day for 26 years    Types: Cigarettes  . Smokeless tobacco: Never Used  . Alcohol Use: Yes     Comment: 03/28/2014 "have a drink a few times/year"  . Drug Use: No  . Sexual Activity: Not Currently   Other Topics Concern  . Not on file   Social History Narrative     Objective: BP 153/96 mmHg  Pulse 97  Wt 244 lb (110.678 kg)  LMP 11/19/2014  Vital signs reviewed. General: Alert and Oriented, No Acute Distress HEENT: Pupils equal, round, reactive to light. Conjunctivae clear.  External ears unremarkable.  Moist mucous membranes. Lungs: Clear and comfortable work of breathing, speaking in full sentences without accessory muscle use. Cardiac: Regular rate and rhythm.  Neuro: CN II-XII grossly intact, gait normal. Extremities: No peripheral edema.  Strong peripheral pulses.  Mental Status: No depression, anxiety, nor agitation. Logical though process. Skin: Warm and dry. Assessment & Plan: Jasmine [name] was seen today for diabetes and hypertension.  Diagnoses and all orders for this visit:  Type 2 diabetes mellitus with diabetic chronic kidney disease Orders: -     Insulin  Glargine (LANTUS SOLOSTAR) 100 UNIT/ML Solostar Pen; Inject 60 Units into the skin daily at 10 pm. -     HgB A1c  Essential hypertension  Other orders -     losartan (COZAAR) 50 MG tablet; Take 1 tablet (50 mg total) by mouth daily. -     fluconazole (DIFLUCAN) 150 MG tablet; Take one tab, may take second tab if no improvement after 72 hours. -     Insulin Pen Needle 31G X 5 MM MISC; Use to inject lantus SQ daily.   Type 2 diabetes: Uncontrolled chronic condition increasing Lantus, begin the first week at only 30 units nightly and then increase to 60 units nightly. Continue Januvia. Follow-up one month Essential hypertension: Uncontrolled  increasing losartan Suspect vaginal candidiasis therefore start Diflucan, this should also improve once blood sugars improved.  Return in about 4 weeks (around 02/06/2015).

## 2015-01-09 NOTE — Telephone Encounter (Signed)
Patient advised to follow up with Dr Ivan AnchorsHommel. She reports having an appointment today. Advised of the medications refills by Dr Ivan AnchorsHommel.

## 2015-01-10 ENCOUNTER — Telehealth: Payer: Self-pay | Admitting: *Deleted

## 2015-01-10 LAB — HEMOGLOBIN A1C
Hgb A1c MFr Bld: 15 % — ABNORMAL HIGH (ref <5.7)
MEAN PLASMA GLUCOSE: 384 mg/dL — AB (ref <117)

## 2015-01-10 LAB — POCT GLYCOSYLATED HEMOGLOBIN (HGB A1C)

## 2015-01-10 NOTE — Addendum Note (Signed)
Addended by: Avon GullyMCCRIMMON, ANDREA C on: 01/10/2015 11:28 AM   Modules accepted: Orders

## 2015-01-10 NOTE — Telephone Encounter (Signed)
Pt wants to know if you would be willing to fill out STD papers for her until her blood sugar gets adjusted since she is having blurred vision.

## 2015-01-11 NOTE — Telephone Encounter (Signed)
Yes I would be willing to do this, I would write her out for two weeks starting on June 1st when her medications were changed.

## 2015-01-11 NOTE — Telephone Encounter (Signed)
Pt notified and will let us know when the papers that need to be filled out are on the way

## 2015-01-30 ENCOUNTER — Emergency Department (HOSPITAL_COMMUNITY)
Admission: EM | Admit: 2015-01-30 | Discharge: 2015-01-30 | Disposition: A | Discharge status: Home / Self Care | Payer: 59 | Attending: Emergency Medicine | Admitting: Emergency Medicine

## 2015-01-30 ENCOUNTER — Emergency Department (HOSPITAL_COMMUNITY): Payer: 59

## 2015-01-30 DIAGNOSIS — E119 Type 2 diabetes mellitus without complications: Secondary | ICD-10-CM | POA: Insufficient documentation

## 2015-01-30 DIAGNOSIS — Z794 Long term (current) use of insulin: Secondary | ICD-10-CM | POA: Diagnosis not present

## 2015-01-30 DIAGNOSIS — I129 Hypertensive chronic kidney disease with stage 1 through stage 4 chronic kidney disease, or unspecified chronic kidney disease: Secondary | ICD-10-CM | POA: Diagnosis not present

## 2015-01-30 DIAGNOSIS — Z79899 Other long term (current) drug therapy: Secondary | ICD-10-CM | POA: Diagnosis not present

## 2015-01-30 DIAGNOSIS — Z720 Tobacco use: Secondary | ICD-10-CM | POA: Insufficient documentation

## 2015-01-30 DIAGNOSIS — R51 Headache: Secondary | ICD-10-CM | POA: Insufficient documentation

## 2015-01-30 DIAGNOSIS — N182 Chronic kidney disease, stage 2 (mild): Secondary | ICD-10-CM | POA: Diagnosis not present

## 2015-01-30 DIAGNOSIS — R519 Headache, unspecified: Secondary | ICD-10-CM

## 2015-01-30 DIAGNOSIS — R52 Pain, unspecified: Secondary | ICD-10-CM

## 2015-01-30 DIAGNOSIS — F329 Major depressive disorder, single episode, unspecified: Secondary | ICD-10-CM | POA: Diagnosis not present

## 2015-01-30 DIAGNOSIS — Z8719 Personal history of other diseases of the digestive system: Secondary | ICD-10-CM | POA: Insufficient documentation

## 2015-01-30 DIAGNOSIS — M1990 Unspecified osteoarthritis, unspecified site: Secondary | ICD-10-CM | POA: Insufficient documentation

## 2015-01-30 DIAGNOSIS — H5711 Ocular pain, right eye: Secondary | ICD-10-CM | POA: Insufficient documentation

## 2015-01-30 DIAGNOSIS — E780 Pure hypercholesterolemia: Secondary | ICD-10-CM | POA: Diagnosis not present

## 2015-01-30 LAB — CBG MONITORING, ED: Glucose-Capillary: 389 mg/dL — ABNORMAL HIGH (ref 65–99)

## 2015-01-30 MED ORDER — SODIUM CHLORIDE 0.9 % IV BOLUS (SEPSIS): 1000.0000 mL | Freq: Once | INTRAVENOUS | Status: DC

## 2015-01-30 MED ORDER — HYDROCODONE-ACETAMINOPHEN 5-325 MG PO TABS
1.0000 | ORAL_TABLET | Freq: Once | ORAL | Status: DC
  Filled 2015-01-30: qty 1
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
## 2015-01-30 NOTE — ED Notes (Signed)
Pt verbalizes understanding of d/c instructions and denies any further need at this time. 
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
## 2015-01-30 NOTE — ED Notes (Signed)
Patients usually wears glasses but states she was able to see visual acuity screening better without glasses.

## 2015-01-30 NOTE — ED Notes (Signed)
Pt refused pain medication, refused blood work and fluids stating that she is here for her eye pain and that her PCP is dealing with her sugar.  Informed Dr. Estell Harpin

## 2015-01-30 NOTE — ED Notes (Signed)
Sarah RN notified of CBG results 389

## 2015-01-30 NOTE — Discharge Instructions (Signed)
Follow up with your family md this week.  Return if problems

## 2015-02-08 ENCOUNTER — Ambulatory Visit (INDEPENDENT_AMBULATORY_CARE_PROVIDER_SITE_OTHER): Payer: 59 | Admitting: Family Medicine

## 2015-02-08 ENCOUNTER — Ambulatory Visit: Payer: 59 | Admitting: Family Medicine

## 2015-02-08 ENCOUNTER — Encounter: Payer: Self-pay | Admitting: Family Medicine

## 2015-02-08 VITALS — BP 140/82 | HR 88 | Wt 240.0 lb

## 2015-02-08 DIAGNOSIS — I10 Essential (primary) hypertension: Secondary | ICD-10-CM

## 2015-02-08 DIAGNOSIS — E1122 Type 2 diabetes mellitus with diabetic chronic kidney disease: Secondary | ICD-10-CM | POA: Diagnosis not present

## 2015-02-08 DIAGNOSIS — F329 Major depressive disorder, single episode, unspecified: Secondary | ICD-10-CM | POA: Diagnosis not present

## 2015-02-08 DIAGNOSIS — N189 Chronic kidney disease, unspecified: Secondary | ICD-10-CM | POA: Diagnosis not present

## 2015-02-08 DIAGNOSIS — F32A Depression, unspecified: Secondary | ICD-10-CM

## 2015-02-08 LAB — TSH: TSH: 0.86 u[IU]/mL (ref 0.350–4.500)

## 2015-02-08 MED ORDER — INSULIN GLARGINE 100 UNIT/ML SOLOSTAR PEN: 80.0000 [IU] | PEN_INJECTOR | Freq: Every day | SUBCUTANEOUS | Status: DC

## 2015-02-08 NOTE — Progress Notes (Addendum)
CC: Jasmine [name] is a 49 y.o. female is here for f/u blood sugar   Subjective: HPI:  Follow-up essential hypertension: Continues on losartan on a daily basis. No outside blood pressures report. No chest pain shortness of breath orthopnea nor peripheral edema.  Follow-up type 2 diabetes: Taking 60 units of Lantus on a daily basis. She still sees fasting blood sugars in the 304 100 range. She denies consuming simple sugars or carbohydrates in general. She tells me she is baking all of her food.  Daughter here confirms that there is absolutely no candy sweets or starchy treats in the house.  She denies hypo-glycemic episodes. Continues on Januvia as well. Has some moderate vision blurring that an eye doctor told her was secondary to hyperglycemia. She denies polyuria plication or polydipsia.  Follow depression: States Zoloft seems to be helping. She is frustrated about her blood sugars but overall not depressed.     Review Of Systems Outlined In HPI  Past Medical History  Diagnosis Date  . Hypercholesteremia   . Hypertension   . Umbilical hernia without mention of obstruction or gangrene   . Depression   . Seasonal allergies   . Wears glasses   . Numbness and tingling of right arm and leg   . Type II diabetes mellitus   . Arthritis     "knees" (03/28/2014)  . Chronic renal insufficiency     stage II (mild)    Past Surgical History  Procedure Laterality Date  . Multiple tooth extractions  ~ 2003  . Umbilical hernia repair  1973  . Tonsillectomy    . Ventral hernia repair  03/27/2014  . Hernia repair    . Ventral hernia repair N/A 03/28/2014    Procedure: LAPAROSCOPIC VENTRAL HERNIA;  Surgeon: Axel Filler, MD;  Location: [facility] OR;  Service: General;  Laterality: N/A;  . Insertion of mesh N/A 03/28/2014    Procedure: INSERTION OF MESH;  Surgeon: Axel Filler, MD;  Location: [facility] OR;  Service: General;  Laterality: N/A;   Family History  Problem Relation Age of Onset  .  Diabetes Mother   . Diabetes Father   . Cancer Father     Prostate cancer  . Cancer Sister     Cervical cancer  . Diabetes Brother     History   Social History  . Marital Status: Married    Spouse Name: N/A  . Number of Children: N/A  . Years of Education: N/A   Occupational History  . Not on file.   Social History Main Topics  . Smoking status: Current Every Day Smoker -- 0.25 packs/day for 26 years    Types: Cigarettes  . Smokeless tobacco: Never Used  . Alcohol Use: Yes     Comment: 03/28/2014 "have a drink a few times/year"  . Drug Use: No  . Sexual Activity: Not Currently   Other Topics Concern  . Not on file   Social History Narrative     Objective: BP 140/82 mmHg  Pulse 88  Wt 240 lb (108.863 kg)  LMP 11/20/2014  General: Alert and Oriented, No Acute Distress HEENT: Pupils equal, round, reactive to light. Conjunctivae clear. Moist mucous membranes  Lungs: Clear to auscultation bilaterally, no wheezing/ronchi/rales.  Comfortable work of breathing. Good air movement. Cardiac: Regular rate and rhythm. Normal S1/S2.  No murmurs, rubs, nor gallops.   Extremities: No peripheral edema.  Strong peripheral pulses.  Mental Status: No depression, anxiety, nor agitation. Skin: Warm and dry.  Assessment &  Plan: Jasmine [name] was seen today for f/u blood sugar.  Diagnoses and all orders for this visit:  Type 2 diabetes mellitus with diabetic chronic kidney disease Orders: -     TSH -     C-peptide -     Insulin Glargine (LANTUS SOLOSTAR) 100 UNIT/ML Solostar Pen; Inject 80 Units into the skin daily at 10 pm.  Essential hypertension  Depression   Type 2 diabetes: Uncontrolled increasing Lantus. Rule out hypothyroidism. I'm starting to think that her diabetes is more due to pancreatic failure rather than lifestyle changes. Checking a C-peptide to determine if she is producing any endogenous insulin, I'll wait for this result however I'm likely going to refer her to  endocrinology. Essential hypertension: Controlled, continue losartan Depression: Controlled continue Zoloft   Return if symptoms worsen or fail to improve.

## 2015-02-09 LAB — C-PEPTIDE: C-Peptide: 1.59 ng/mL (ref 0.80–3.90)

## 2015-02-13 ENCOUNTER — Telehealth: Payer: Self-pay | Admitting: Family Medicine

## 2015-02-13 DIAGNOSIS — E1122 Type 2 diabetes mellitus with diabetic chronic kidney disease: Secondary | ICD-10-CM

## 2015-02-13 NOTE — Telephone Encounter (Signed)
Pt notified. Also refaxed  FMLA paper work because pt states they didn't receive it.

## 2015-02-13 NOTE — Telephone Encounter (Signed)
Jasmine [name] Will you please let patient know that her thyroid function and insulin level were normal however I'd still recommend she visit with an endocrinologist.  I'd also recommend she increase her daily dose of lantus by 2 units every 2 days until fasting blood sugar is at 120 or less.  F/U with me in three weeks.

## 2015-02-20 ENCOUNTER — Other Ambulatory Visit: Payer: Self-pay | Admitting: Family Medicine

## 2015-02-20 DIAGNOSIS — E1122 Type 2 diabetes mellitus with diabetic chronic kidney disease: Secondary | ICD-10-CM

## 2015-02-20 MED ORDER — INSULIN GLARGINE 100 UNIT/ML SOLOSTAR PEN: 80.0000 [IU] | PEN_INJECTOR | Freq: Every day | SUBCUTANEOUS | Status: DC

## 2015-02-20 NOTE — Telephone Encounter (Signed)
Pharmacy requesting 90 day supply

## 2015-02-20 NOTE — Telephone Encounter (Signed)
Rx sent to [facility]

## 2015-02-21 ENCOUNTER — Other Ambulatory Visit: Payer: Self-pay | Admitting: Family Medicine

## 2015-02-21 DIAGNOSIS — E1122 Type 2 diabetes mellitus with diabetic chronic kidney disease: Secondary | ICD-10-CM

## 2015-02-21 MED ORDER — INSULIN GLARGINE 100 UNIT/ML SOLOSTAR PEN: 80.0000 [IU] | PEN_INJECTOR | Freq: Every day | SUBCUTANEOUS | Status: DC

## 2015-02-26 ENCOUNTER — Telehealth: Payer: Self-pay | Admitting: *Deleted

## 2015-02-26 DIAGNOSIS — F32A Depression, unspecified: Secondary | ICD-10-CM

## 2015-02-26 DIAGNOSIS — F329 Major depressive disorder, single episode, unspecified: Secondary | ICD-10-CM

## 2015-02-26 NOTE — Telephone Encounter (Signed)
Pt wanted to know if we could refer her to a therapist to help her with anxiety and stress. She states that the feels her stress and anxiety are causing her sugars to be more elevated. Pt request to see Dominic PeaSarah Solomon downstairs

## 2015-03-12 ENCOUNTER — Other Ambulatory Visit: Payer: Self-pay

## 2015-03-12 DIAGNOSIS — K432 Incisional hernia without obstruction or gangrene: Secondary | ICD-10-CM

## 2015-03-13 ENCOUNTER — Encounter: Payer: Self-pay | Admitting: Family Medicine

## 2015-03-13 ENCOUNTER — Ambulatory Visit (INDEPENDENT_AMBULATORY_CARE_PROVIDER_SITE_OTHER): Payer: 59 | Admitting: Family Medicine

## 2015-03-13 VITALS — BP 140/99 | HR 76 | Wt 245.0 lb

## 2015-03-13 DIAGNOSIS — E1122 Type 2 diabetes mellitus with diabetic chronic kidney disease: Secondary | ICD-10-CM

## 2015-03-13 DIAGNOSIS — H547 Unspecified visual loss: Secondary | ICD-10-CM | POA: Diagnosis not present

## 2015-03-13 DIAGNOSIS — N189 Chronic kidney disease, unspecified: Secondary | ICD-10-CM

## 2015-03-13 DIAGNOSIS — I10 Essential (primary) hypertension: Secondary | ICD-10-CM

## 2015-03-13 LAB — HM MAMMOGRAPHY

## 2015-03-13 MED ORDER — INSULIN GLARGINE 300 UNIT/ML ~~LOC~~ SOPN: 80.0000 [IU] | PEN_INJECTOR | Freq: Every day | SUBCUTANEOUS | Status: DC

## 2015-03-13 NOTE — Patient Instructions (Signed)
After switching to Toujeo (insulin) please write down your blood sugar and dates in the below space for one week:  Drop off up front anytime.                  Marland Kitchen

## 2015-03-13 NOTE — Progress Notes (Signed)
CC: Jasmine [name] is a 49 y.o. female is here for f/u blood sugars   Subjective: HPI:  Follow-up type 2 diabetes: Continues to take 80 units of Lantus on a nightly basis. Fasting blood sugars have been slowly improving. The first week after I saw her they were in the 250s, now they aren't to 230s without any changes in diet. She continues to avoid carbohydrates and has been 100% compliant with avoiding snacks and sweets. She still reports vision loss described as blurriness. She has been able to drive due to not being able to see other cars on the road. Denies polyuria polyphagia or polydipsia. No hypoglycemic episodes since I saw her last.  Follow-up essential hypertension: Continues on losartan daily. No outside blood pressures report. She did not take it today because she thought she needed to be 100% fasting without any oral medication in her system. No chest pain shortness of breath orthopnea nor peripheral edema  Review Of Systems Outlined In HPI  Past Medical History  Diagnosis Date  . Hypercholesteremia   . Hypertension   . Umbilical hernia without mention of obstruction or gangrene   . Depression   . Seasonal allergies   . Wears glasses   . Numbness and tingling of right arm and leg   . Type II diabetes mellitus   . Arthritis     "knees" (03/28/2014)  . Chronic renal insufficiency     stage II (mild)    Past Surgical History  Procedure Laterality Date  . Multiple tooth extractions  ~ 2003  . Umbilical hernia repair  1973  . Tonsillectomy    . Ventral hernia repair  03/27/2014  . Hernia repair    . Ventral hernia repair N/A 03/28/2014    Procedure: LAPAROSCOPIC VENTRAL HERNIA;  Surgeon: Axel Filler, MD;  Location: [facility] OR;  Service: General;  Laterality: N/A;  . Insertion of mesh N/A 03/28/2014    Procedure: INSERTION OF MESH;  Surgeon: Axel Filler, MD;  Location: [facility] OR;  Service: General;  Laterality: N/A;   Family History  Problem Relation Age of Onset  .  Diabetes Mother   . Diabetes Father   . Cancer Father     Prostate cancer  . Cancer Sister     Cervical cancer  . Diabetes Brother     History   Social History  . Marital Status: Married    Spouse Name: N/A  . Number of Children: N/A  . Years of Education: N/A   Occupational History  . Not on file.   Social History Main Topics  . Smoking status: Current Every Day Smoker -- 0.25 packs/day for 26 years    Types: Cigarettes  . Smokeless tobacco: Never Used  . Alcohol Use: Yes     Comment: 03/28/2014 "have a drink a few times/year"  . Drug Use: No  . Sexual Activity: Not Currently   Other Topics Concern  . Not on file   Social History Narrative     Objective: BP 140/99 mmHg  Pulse 76  Wt 245 lb (111.131 kg)  Vital signs reviewed. General: Alert and Oriented, No Acute Distress HEENT: Pupils equal, round, reactive to light. Conjunctivae clear.  External ears unremarkable.  Moist mucous membranes. Lungs: Clear and comfortable work of breathing, speaking in full sentences without accessory muscle use. Cardiac: Regular rate and rhythm.  Neuro: CN II-XII grossly intact, gait normal. Extremities: No peripheral edema.  Strong peripheral pulses.  Mental Status: No depression, anxiety, nor agitation. Logical  though process. Skin: Warm and dry.  Assessment & Plan: Valory was seen today for f/u blood sugars.  Diagnoses and all orders for this visit:  Type 2 diabetes mellitus with diabetic chronic kidney disease Orders: -     Ambulatory referral to Endocrinology -     Insulin Glargine (TOUJEO SOLOSTAR) 300 UNIT/ML SOPN; Inject 80 Units into the skin daily.  Essential hypertension  Vision loss   Type 2 diabetes: Uncontrolled chronic condition, Referral to endocrinology was not officially place at her last visit and this has been performed today. I'd like her to switch from Lantus to [facility] but still continues 80 units daily, also discussed that this new insulin seems to  work better when he comes to unit for unit comparisons therefore no need to increase her units yet. Essential hypertension: Uncontrolled but likely only due to her not taking her medication in the last 24 hours. Advised to restart daily losartan, no need to stop this for any future fasting visits. Vision loss seems to still be significant, until I can get her left and right vision at 20/40 or better I do not feel safe to drive nor would be productive to go back to work yet. Return one month  Return in about 4 weeks (around 04/10/2015).

## 2015-03-14 ENCOUNTER — Encounter: Payer: Self-pay | Admitting: Family Medicine

## 2015-03-18 ENCOUNTER — Encounter: Payer: Self-pay | Admitting: Women's Health

## 2015-03-20 ENCOUNTER — Ambulatory Visit
Admission: RE | Admit: 2015-03-20 | Discharge: 2015-03-20 | Disposition: A | Discharge status: Home / Self Care | Payer: 59 | Source: Ambulatory Visit | Attending: General Surgery | Admitting: General Surgery

## 2015-03-20 MED ORDER — IOPAMIDOL (ISOVUE-300) INJECTION 61%
125.0000 mL | Freq: Once | INTRAVENOUS | Status: AC | PRN
  Administered 2015-03-20: 125 mL via INTRAVENOUS

## 2015-03-29 ENCOUNTER — Ambulatory Visit (HOSPITAL_COMMUNITY): Payer: 59 | Admitting: Licensed Clinical Social Worker

## 2015-04-02 ENCOUNTER — Other Ambulatory Visit: Payer: Self-pay | Admitting: Family Medicine

## 2015-04-04 ENCOUNTER — Other Ambulatory Visit: Payer: Self-pay | Admitting: Family Medicine

## 2015-04-05 ENCOUNTER — Other Ambulatory Visit: Payer: Self-pay | Admitting: *Deleted

## 2015-04-16 ENCOUNTER — Encounter: Payer: Self-pay | Admitting: Family Medicine

## 2015-04-16 ENCOUNTER — Ambulatory Visit (INDEPENDENT_AMBULATORY_CARE_PROVIDER_SITE_OTHER): Payer: 59 | Admitting: Family Medicine

## 2015-04-16 VITALS — BP 136/91 | HR 97 | Wt 250.0 lb

## 2015-04-16 DIAGNOSIS — N189 Chronic kidney disease, unspecified: Secondary | ICD-10-CM | POA: Diagnosis not present

## 2015-04-16 DIAGNOSIS — E1122 Type 2 diabetes mellitus with diabetic chronic kidney disease: Secondary | ICD-10-CM | POA: Diagnosis not present

## 2015-04-16 DIAGNOSIS — I10 Essential (primary) hypertension: Secondary | ICD-10-CM | POA: Diagnosis not present

## 2015-04-16 LAB — POCT GLYCOSYLATED HEMOGLOBIN (HGB A1C): HEMOGLOBIN A1C: 12.9

## 2015-04-16 MED ORDER — GLUCOSE BLOOD VI STRP: ORAL_STRIP | Status: DC

## 2015-04-16 MED ORDER — LOSARTAN POTASSIUM 50 MG PO TABS: 50.0000 mg | ORAL_TABLET | Freq: Every day | ORAL | Status: DC

## 2015-04-16 MED ORDER — INSULIN GLARGINE 300 UNIT/ML ~~LOC~~ SOPN: 50.0000 [IU] | PEN_INJECTOR | Freq: Every day | SUBCUTANEOUS | Status: DC

## 2015-04-16 NOTE — Progress Notes (Signed)
CC: Jasmine [name] is a 49 y.o. female is here for Diabetes   Subjective: HPI:  Follow-up essential hypertension: Currently only taking losartan 25 mg daily. She tells me she is kind of confused because she knows that 50 mg was most recently prescribed however the pharmacy gave her 25 mg tablets  With instructions to take only 1 daily. No outside blood pressures to report. Nice chest pain shortness of breath orthopnea nor peripheral edema.   Follow-up type 2 diabetes: Currently taking 50 units of Toujeo [employer] on a daily basis. She had been taking 80 units of insulin daily however she was getting hypoglycemic episodes and cut back to 50. Since then she's had no new hyper glycemic episodes. Her fasting blood sugars over the past week have been ranging from 111-140. She tells me her vision seems like it's coming back a little bit. No polyuria plication or polydipsia.     Review Of Systems Outlined In HPI  Past Medical History  Diagnosis Date  . Hypercholesteremia   . Hypertension   . Umbilical hernia without mention of obstruction or gangrene   . Depression   . Seasonal allergies   . Wears glasses   . Numbness and tingling of right arm and leg   . Type II diabetes mellitus   . Arthritis     "knees" (03/28/2014)  . Chronic renal insufficiency     stage II (mild)    Past Surgical History  Procedure Laterality Date  . Multiple tooth extractions  ~ 2003  . Umbilical hernia repair  1973  . Tonsillectomy    . Ventral hernia repair  03/27/2014  . Hernia repair    . Ventral hernia repair N/A 03/28/2014    Procedure: LAPAROSCOPIC VENTRAL HERNIA;  Surgeon: Axel Filler, MD;  Location: [facility] OR;  Service: General;  Laterality: N/A;  . Insertion of mesh N/A 03/28/2014    Procedure: INSERTION OF MESH;  Surgeon: Axel Filler, MD;  Location: [facility] OR;  Service: General;  Laterality: N/A;   Family History  Problem Relation Age of Onset  . Diabetes Mother   . Diabetes Father   . Cancer  Father     Prostate cancer  . Cancer Sister     Cervical cancer  . Diabetes Brother     Social History   Social History  . Marital Status: Married    Spouse Name: N/A  . Number of Children: N/A  . Years of Education: N/A   Occupational History  . Not on file.   Social History Main Topics  . Smoking status: Current Every Day Smoker -- 0.25 packs/day for 26 years    Types: Cigarettes  . Smokeless tobacco: Never Used  . Alcohol Use: Yes     Comment: 03/28/2014 "have a drink a few times/year"  . Drug Use: No  . Sexual Activity: Not Currently   Other Topics Concern  . Not on file   Social History Narrative     Objective: BP 136/91 mmHg  Pulse 97  Wt 250 lb (113.399 kg)  LMP 01/24/2015  Vital signs reviewed. General: Alert and Oriented, No Acute Distress HEENT: Pupils equal, round, reactive to light. Conjunctivae clear.  External ears unremarkable.  Moist mucous membranes. Lungs: Clear and comfortable work of breathing, speaking in full sentences without accessory muscle use. Cardiac: Regular rate and rhythm.  Neuro: CN II-XII grossly intact, gait normal. Extremities: No peripheral edema.  Strong peripheral pulses.  Mental Status: No depression, anxiety, nor agitation. Logical  though process. Skin: Warm and dry.  Assessment & Plan: Jasmine [name] was seen today for diabetes.  Diagnoses and all orders for this visit:  Essential hypertension  Type 2 diabetes mellitus with diabetic chronic kidney disease -     POCT HgB A1C -     Insulin Glargine (TOUJEO SOLOSTAR) 300 UNIT/ML SOPN; Inject 50 Units into the skin daily.  Other orders -     losartan (COZAAR) 50 MG tablet; Take 1 tablet (50 mg total) by mouth daily. -     Discontinue: glucose blood test strip; One Touch Ultra test strips. Check blood sugar 3 times daily. Diagnosis - Diabetes ICD 10 - E 11.22 -     glucose blood test strip; One Touch Ultra test strips. Check blood sugar 3 times daily. Diagnosis - Diabetes ICD  10 - E 11.22   Type 2 diabetes: Uncontrolled but improving, continue Januvia and Toujeo at 50 units daily. Call if any hypo-glycemic episodes occur. Vision is also improving , reminded her that once it gets to 20/40 bilaterally or better she is cleared to go back to work.   essential hypertension: Uncontrolled, she's correct at 50 mg and the intended prescription that she should be taking on a daily basis. New prescription provided.    Return in about 4 weeks (around 05/14/2015).

## 2015-04-18 ENCOUNTER — Other Ambulatory Visit: Payer: Self-pay | Admitting: Family Medicine

## 2015-04-25 ENCOUNTER — Ambulatory Visit: Payer: Self-pay | Admitting: General Surgery

## 2015-04-25 NOTE — H&P (Signed)
  History of Present Illness (Mc Bloodworth MD; 04/12/2015 12:16 PM) Patient words: post op hernia.  The patient is a 49 year old female who presents with an incisional hernia. Patient comes in today secondary to follow-up for smoking cessation. Patient does state that she is stop smoking. Patient has gained approximately 3 pounds she states that her clothing. The patient is scheduled for surgery on the 28th of this month.   Allergies (Robin Gwynn, RMA; 04/12/2015 12:05 PM) Effexor XR *ANTIDEPRESSANTS* Egg MetFORMIN HCl *CHEMICALS*  Medication History (Robin Gwynn, RMA; 04/12/2015 12:07 PM) ZyrTEC Allergy (10MG Capsule, Oral) Active. Flonase (50MCG/ACT Suspension, Nasal) Active. BD Pen Needle Nano U/F (32G X 4 MM Misc,) Active. Cozaar (25MG Tablet, Oral) Active. OneTouch Delica Lancets Fine Active. Zoloft (50MG Tablet, Oral) Active. Zocor (10MG Tablet, Oral) Active. Januvia (100MG Tablet, Oral) Active. TraMADol HCl (50MG Tablet, Oral) Active. Toujeo SoloStar (300UNIT/ML Soln Pen-inj, Subcutaneous) Active. Medications Reconciled  Vitals (Robin Gwynn RMA; 04/12/2015 12:07 PM) 04/12/2015 12:07 PM Weight: 251 lb Height: 62in Body Surface Area: 2.23 m Body Mass Index: 45.91 kg/m Temp.: 98.1F  Pulse: 72 (Regular)  BP: 124/80 (Sitting, Left Arm, Standard)    Physical Exam (Krysti Hickling MD; 04/12/2015 12:15 PM) General Mental Status-Alert. General Appearance-Consistent with stated age. Hydration-Well hydrated. Voice-Normal.  Head and Neck Head-normocephalic, atraumatic with no lesions or palpable masses. Trachea-midline. Thyroid Gland Characteristics - normal size and consistency.  Chest and Lung Exam Chest and lung exam reveals -quiet, even and easy respiratory effort with no use of accessory muscles and on auscultation, normal breath sounds, no adventitious sounds and normal vocal resonance. Inspection Chest Wall - Normal. Back -  normal.  Cardiovascular Cardiovascular examination reveals -normal heart sounds, regular rate and rhythm with no murmurs and normal pedal pulses bilaterally.  Abdomen Inspection Skin - Scar - no surgical scars. Hernias - Ventral hernia - Incarcerated. Palpation/Percussion Normal exam - Soft, Non Tender, No Rebound tenderness, No Rigidity (guarding) and No hepatosplenomegaly. Auscultation Normal exam - Bowel sounds normal.    Assessment & Plan (Egidio Lofgren MD; 04/12/2015 12:17 PM) RECURRENT VENTRAL HERNIA WITH INCARCERATION (552.21  K43.0) Impression: Patient is a 49-year-old female with a recurrent ventral hernia.  1. Will proceed to the operating room for a lap lysis of adhesions and ventral hernia repair with mesh. 2. All risks and benefits were discussed with the patient to generally include, but not limited to: infection, bleeding, damage to surrounding structures, acute and chronic nerve pain, and recurrence. Alternatives were offered and described. All questions were answered and the patient voiced understanding of the procedure and wishes to proceed at this point with hernia repair.  

## 2015-04-29 ENCOUNTER — Other Ambulatory Visit (HOSPITAL_COMMUNITY): Payer: Self-pay | Admitting: *Deleted

## 2015-04-29 ENCOUNTER — Encounter (HOSPITAL_COMMUNITY)
Admission: RE | Admit: 2015-04-29 | Discharge: 2015-04-29 | Disposition: A | Discharge status: Home / Self Care | Payer: 59 | Source: Ambulatory Visit | Attending: General Surgery | Admitting: General Surgery

## 2015-04-29 ENCOUNTER — Encounter (HOSPITAL_COMMUNITY): Payer: Self-pay

## 2015-04-29 DIAGNOSIS — K439 Ventral hernia without obstruction or gangrene: Secondary | ICD-10-CM | POA: Diagnosis not present

## 2015-04-29 DIAGNOSIS — Z01818 Encounter for other preprocedural examination: Secondary | ICD-10-CM | POA: Insufficient documentation

## 2015-04-29 DIAGNOSIS — Z01812 Encounter for preprocedural laboratory examination: Secondary | ICD-10-CM | POA: Insufficient documentation

## 2015-04-29 DIAGNOSIS — I10 Essential (primary) hypertension: Secondary | ICD-10-CM | POA: Insufficient documentation

## 2015-04-29 HISTORY — DX: Headache: R51

## 2015-04-29 HISTORY — DX: Anxiety disorder, unspecified: F41.9

## 2015-04-29 HISTORY — DX: Headache, unspecified: R51.9

## 2015-04-29 LAB — CBC
HEMATOCRIT: 38.8 % (ref 36.0–46.0)
HEMOGLOBIN: 12.3 g/dL (ref 12.0–15.0)
MCH: 23.2 pg — AB (ref 26.0–34.0)
MCHC: 31.7 g/dL (ref 30.0–36.0)
MCV: 73.1 fL — ABNORMAL LOW (ref 78.0–100.0)
Platelets: 242 10*3/uL (ref 150–400)
RBC: 5.31 MIL/uL — ABNORMAL HIGH (ref 3.87–5.11)
RDW: 15.7 % — AB (ref 11.5–15.5)
WBC: 5.5 10*3/uL (ref 4.0–10.5)

## 2015-04-29 LAB — BASIC METABOLIC PANEL
ANION GAP: 9 (ref 5–15)
BUN: 11 mg/dL (ref 6–20)
CALCIUM: 9.5 mg/dL (ref 8.9–10.3)
CO2: 21 mmol/L — AB (ref 22–32)
Chloride: 110 mmol/L (ref 101–111)
Creatinine, Ser: 1.16 mg/dL — ABNORMAL HIGH (ref 0.44–1.00)
GFR calc Af Amer: >60 mL/min (ref >60)
GFR, EST NON AFRICAN AMERICAN: 55 mL/min — AB (ref >60)
GLUCOSE: 127 mg/dL — AB (ref 65–99)
Potassium: 4.6 mmol/L (ref 3.5–5.1)
Sodium: 140 mmol/L (ref 135–145)

## 2015-04-29 LAB — GLUCOSE, CAPILLARY: GLUCOSE-CAPILLARY: 122 mg/dL — AB (ref 65–99)

## 2015-04-29 LAB — HCG, SERUM, QUALITATIVE: Preg, Serum: NEGATIVE

## 2015-04-29 NOTE — Progress Notes (Signed)
Pt denies any cardiac history, chest pain or sob. Pt's most recent A1C was 12.9 on 04/16/15. Down from 74 in June. Pt states her fasting blood sugars run from 110-120's most of the time. Today was 122 here - had only coffee with creamer prior to CBG. Pt states the highest her blood sugars get at home are around 175. Dr. Ivan Anchors is her PCP and manages her diabetes. She states if she loses weight, her blood sugars go out of control. She states Dr. Ivan Anchors is aware of this.

## 2015-04-29 NOTE — Pre-Procedure Instructions (Signed)
ADALYNE LOVICK  04/29/2015     Your procedure is scheduled on Wednesday, May 08, 2015 at 11:00 AM.   Report to [facility] Entrance "A" Admitting Office at 9:00 AM.   Call this number if you have problems the morning of surgery: [phone]   Any questions prior to day of surgery, please call [phone] between 8 & 4 PM.   Remember:  Do not eat food or drink liquids after midnight Tuesday, 05/07/15.  Take these medicines the morning of surgery with A SIP OF WATER: Sertraline (Zoloft), Tramadol - if needed, Zyrtec - if needed.  Stop NSAIDS (Meloxicam, Ibuprofen, Aleve, etc) 7 days prior to surgery. Do not use any Aspirin products prior to surgery.  How to Manage Your Diabetes Before Surgery   Why is it important to control my blood sugar before and after surgery?   Improving blood sugar levels before and after surgery helps healing and can limit problems.  A way of improving blood sugar control is eating a healthy diet by:  - Eating less sugar and carbohydrates  - Increasing activity/exercise  - Talk with your doctor about reaching your blood sugar goals  High blood sugars (greater than 180 mg/dL) can raise your risk of infections and slow down your recovery so you will need to focus on controlling your diabetes during the weeks before surgery.  Make sure that the doctor who takes care of your diabetes knows about your planned surgery including the date and location.  How do I manage my blood sugars before surgery?   Check your blood sugar at least 4 times a day, 2 days before surgery to make sure that they are not too high or low.   Check your blood sugar the morning of your surgery when you wake up and every 2 hours until you get to the Short-Stay unit.   Treat a low blood sugar (less than 70 mg/dL) with 1/2 cup of clear juice (cranberry or apple), 4 glucose tablets, OR glucose gel.   Recheck blood sugar in 15 minutes after treatment (to make sure  it is greater than 70 mg/dL).  If blood sugar is not greater than 70 mg/dL on re-check, call [phone] for further instructions.    Report your blood sugar to the Short-Stay nurse when you get to Short-Stay.  References:  [facility] 2007 "How to Manage your Diabetes Before and After Surgery".  What do I do about my diabetes medications?   Do not take oral diabetes medicines (pills) the morning of surgery.   Do not take your insulin (Toujeo) the morning of surgery.     Do not wear jewelry, make-up or nail polish.  Do not wear lotions, powders, or perfumes.  You may wear deodorant.  Do not shave 48 hours prior to surgery.   Do not bring valuables to the hospital.  [facility] is not responsible for any belongings or valuables.  Contacts, dentures or bridgework may not be worn into surgery.  Leave your suitcase in the car.  After surgery it may be brought to your room.  For patients admitted to the hospital, discharge time will be determined by your treatment team.  Patients discharged the day of surgery will not be allowed to drive home.   Special instructions:  [facility] - Preparing for Surgery  Before surgery, you can play an important role.  Because skin is not sterile, your skin needs to be as free of germs  as possible.  You can reduce the number of germs on you skin by washing with CHG (chlorahexidine gluconate) soap before surgery.  CHG is an antiseptic cleaner which kills germs and bonds with the skin to continue killing germs even after washing.  Please DO NOT use if you have an allergy to CHG or antibacterial soaps.  If your skin becomes reddened/irritated stop using the CHG and inform your nurse when you arrive at Short Stay.  Do not shave (including legs and underarms) for at least 48 hours prior to the first CHG shower.  You may shave your face.  Please follow these instructions carefully:   1.  Shower with CHG Soap the night before  surgery and the                                morning of Surgery.  2.  If you choose to wash your hair, wash your hair first as usual with your       normal shampoo.  3.  After you shampoo, rinse your hair and body thoroughly to remove the                      Shampoo.  4.  Use CHG as you would any other liquid soap.  You can apply chg directly       to the skin and wash gently with scrungie or a clean washcloth.  5.  Apply the CHG Soap to your body ONLY FROM THE NECK DOWN.        Do not use on open wounds or open sores.  Avoid contact with your eyes, ears, mouth and genitals (private parts).  Wash genitals (private parts) with your normal soap.  6.  Wash thoroughly, paying special attention to the area where your surgery        will be performed.  7.  Thoroughly rinse your body with warm water from the neck down.  8.  DO NOT shower/wash with your normal soap after using and rinsing off       the CHG Soap.  9.  Pat yourself dry with a clean towel.            10.  Wear clean pajamas.            11.  Place clean sheets on your bed the night of your first shower and do not        sleep with pets.  Day of Surgery  Do not apply any lotions the morning of surgery.  Please wear clean clothes to the hospital.   Please read over the following fact sheets that you were given. Pain Booklet, Coughing and Deep Breathing and Surgical Site Infection Prevention

## 2015-04-29 NOTE — Pre-Procedure Instructions (Signed)
Jasmine [name]  04/29/2015     Your procedure is scheduled on Wednesday, May 08, 2015 at 11:00 AM.   Report to [facility] Entrance "A" Admitting Office at 9:00 AM.   Call this number if you have problems the morning of surgery: [phone]   Any questions prior to day of surgery, please call [phone] between 8 & 4 PM.   Remember:  Do not eat food or drink liquids after midnight Tuesday, 05/07/15.  Take these medicines the morning of surgery with A SIP OF WATER: Sertraline (Zoloft), Tramadol - if needed, Zyrtec - if needed.  Stop NSAIDS (Meloxicam, Ibuprofen, Aleve, etc) 7 days prior to surgery. Do not use any Aspirin products prior to surgery.  How to Manage Your Diabetes Before Surgery   Why is it important to control my blood sugar before and after surgery?   Improving blood sugar levels before and after surgery helps healing and can limit problems.  A way of improving blood sugar control is eating a healthy diet by:  - Eating less sugar and carbohydrates  - Increasing activity/exercise  - Talk with your doctor about reaching your blood sugar goals  High blood sugars (greater than 180 mg/dL) can raise your risk of infections and slow down your recovery so you will need to focus on controlling your diabetes during the weeks before surgery.  Make sure that the doctor who takes care of your diabetes knows about your planned surgery including the date and location.  How do I manage my blood sugars before surgery?   Check your blood sugar at least 4 times a day, 2 days before surgery to make sure that they are not too high or low.   Check your blood sugar the morning of your surgery when you wake up and every 2 hours until you get to the Short-Stay unit.   Treat a low blood sugar (less than 70 mg/dL) with 1/2 cup of clear juice (cranberry or apple), 4 glucose tablets, OR glucose gel.   Recheck blood sugar in 15 minutes after treatment (to make sure  it is greater than 70 mg/dL).  If blood sugar is not greater than 70 mg/dL on re-check, call [phone] for further instructions.    Report your blood sugar to the Short-Stay nurse when you get to Short-Stay.  References:  [facility], 2007 "How to Manage your Diabetes Before and After Surgery".  What do I do about my diabetes medications?   Do not take oral diabetes medicines (pills) the morning of surgery.   Take 40 units of Toujeo the night before surgery.     Do not wear jewelry, make-up or nail polish.  Do not wear lotions, powders, or perfumes.  You may wear deodorant.  Do not shave 48 hours prior to surgery.   Do not bring valuables to the hospital.  [facility] is not responsible for any belongings or valuables.  Contacts, dentures or bridgework may not be worn into surgery.  Leave your suitcase in the car.  After surgery it may be brought to your room.  For patients admitted to the hospital, discharge time will be determined by your treatment team.  Patients discharged the day of surgery will not be allowed to drive home.   Special instructions:  [place] - Preparing for Surgery  Before surgery, you can play an important role.  Because skin is not sterile, your skin needs to be as free of germs as  possible.  You can reduce the number of germs on you skin by washing with CHG (chlorahexidine gluconate) soap before surgery.  CHG is an antiseptic cleaner which kills germs and bonds with the skin to continue killing germs even after washing.  Please DO NOT use if you have an allergy to CHG or antibacterial soaps.  If your skin becomes reddened/irritated stop using the CHG and inform your nurse when you arrive at Short Stay.  Do not shave (including legs and underarms) for at least 48 hours prior to the first CHG shower.  You may shave your face.  Please follow these instructions carefully:   1.  Shower with CHG Soap the night before surgery  and the                                morning of Surgery.  2.  If you choose to wash your hair, wash your hair first as usual with your       normal shampoo.  3.  After you shampoo, rinse your hair and body thoroughly to remove the                      Shampoo.  4.  Use CHG as you would any other liquid soap.  You can apply chg directly       to the skin and wash gently with scrungie or a clean washcloth.  5.  Apply the CHG Soap to your body ONLY FROM THE NECK DOWN.        Do not use on open wounds or open sores.  Avoid contact with your eyes, ears, mouth and genitals (private parts).  Wash genitals (private parts) with your normal soap.  6.  Wash thoroughly, paying special attention to the area where your surgery        will be performed.  7.  Thoroughly rinse your body with warm water from the neck down.  8.  DO NOT shower/wash with your normal soap after using and rinsing off       the CHG Soap.  9.  Pat yourself dry with a clean towel.            10.  Wear clean pajamas.            11.  Place clean sheets on your bed the night of your first shower and do not        sleep with pets.  Day of Surgery  Do not apply any lotions the morning of surgery.  Please wear clean clothes to the hospital.   Please read over the following fact sheets that you were given. Pain Booklet, Coughing and Deep Breathing and Surgical Site Infection Prevention

## 2015-05-07 ENCOUNTER — Ambulatory Visit: Payer: 59 | Admitting: Family Medicine

## 2015-05-07 ENCOUNTER — Ambulatory Visit: Payer: 59 | Admitting: *Deleted

## 2015-05-07 MED ORDER — CEFAZOLIN SODIUM-DEXTROSE 2-3 GM-% IV SOLR
2.0000 g | INTRAVENOUS | Status: AC
  Administered 2015-05-08: 2 g via INTRAVENOUS
  Filled 2015-05-07: qty 50

## 2015-05-08 ENCOUNTER — Ambulatory Visit (HOSPITAL_COMMUNITY): Payer: 59 | Admitting: Anesthesiology

## 2015-05-08 ENCOUNTER — Encounter (HOSPITAL_COMMUNITY): Payer: Self-pay | Admitting: General Practice

## 2015-05-08 ENCOUNTER — Ambulatory Visit (HOSPITAL_COMMUNITY)
Admission: RE | Admit: 2015-05-08 | Discharge: 2015-05-08 | Disposition: A | Discharge status: Home / Self Care | Payer: 59 | Source: Ambulatory Visit | Attending: General Surgery | Admitting: General Surgery

## 2015-05-08 ENCOUNTER — Encounter (HOSPITAL_COMMUNITY)
Admission: RE | Disposition: A | Discharge status: Home / Self Care | Payer: Self-pay | Source: Ambulatory Visit | Attending: General Surgery

## 2015-05-08 ENCOUNTER — Ambulatory Visit (HOSPITAL_COMMUNITY): Payer: 59 | Admitting: Emergency Medicine

## 2015-05-08 DIAGNOSIS — F17200 Nicotine dependence, unspecified, uncomplicated: Secondary | ICD-10-CM | POA: Insufficient documentation

## 2015-05-08 DIAGNOSIS — Z6841 Body Mass Index (BMI) 40.0 and over, adult: Secondary | ICD-10-CM | POA: Diagnosis not present

## 2015-05-08 DIAGNOSIS — I10 Essential (primary) hypertension: Secondary | ICD-10-CM | POA: Insufficient documentation

## 2015-05-08 DIAGNOSIS — K430 Incisional hernia with obstruction, without gangrene: Secondary | ICD-10-CM | POA: Insufficient documentation

## 2015-05-08 DIAGNOSIS — Z7951 Long term (current) use of inhaled steroids: Secondary | ICD-10-CM | POA: Insufficient documentation

## 2015-05-08 DIAGNOSIS — E119 Type 2 diabetes mellitus without complications: Secondary | ICD-10-CM | POA: Insufficient documentation

## 2015-05-08 DIAGNOSIS — Z79899 Other long term (current) drug therapy: Secondary | ICD-10-CM | POA: Diagnosis not present

## 2015-05-08 DIAGNOSIS — E669 Obesity, unspecified: Secondary | ICD-10-CM | POA: Diagnosis not present

## 2015-05-08 HISTORY — PX: LAPAROSCOPIC LYSIS OF ADHESIONS: SHX5905

## 2015-05-08 HISTORY — PX: INSERTION OF MESH: SHX5868

## 2015-05-08 HISTORY — PX: VENTRAL HERNIA REPAIR: SHX424

## 2015-05-08 LAB — GLUCOSE, CAPILLARY
GLUCOSE-CAPILLARY: 96 mg/dL (ref 65–99)
Glucose-Capillary: 119 mg/dL — ABNORMAL HIGH (ref 65–99)
Glucose-Capillary: 113 mg/dL — ABNORMAL HIGH (ref 65–99)

## 2015-05-08 SURGERY — REPAIR, HERNIA, VENTRAL, LAPAROSCOPIC
Anesthesia: General | Site: Abdomen

## 2015-05-08 MED ORDER — MIDAZOLAM HCL 5 MG/5ML IJ SOLN
INTRAMUSCULAR | Status: DC | PRN
  Administered 2015-05-08: 2 mg via INTRAVENOUS

## 2015-05-08 MED ORDER — BUPIVACAINE HCL (PF) 0.25 % IJ SOLN
INTRAMUSCULAR | Status: AC
  Filled 2015-05-08: qty 30

## 2015-05-08 MED ORDER — BUPIVACAINE LIPOSOME 1.3 % IJ SUSP
20.0000 mL | INTRAMUSCULAR | Status: AC
  Administered 2015-05-08: 20 mL
  Filled 2015-05-08: qty 20

## 2015-05-08 MED ORDER — ONDANSETRON HCL 4 MG/2ML IJ SOLN: 4.0000 mg | Freq: Once | INTRAMUSCULAR | Status: DC | PRN

## 2015-05-08 MED ORDER — OXYCODONE HCL 5 MG PO TABS
5.0000 mg | ORAL_TABLET | ORAL | Status: DC | PRN
  Administered 2015-05-08: 10 mg via ORAL
  Filled 2015-05-08: qty 2

## 2015-05-08 MED ORDER — SUCCINYLCHOLINE CHLORIDE 20 MG/ML IJ SOLN
INTRAMUSCULAR | Status: AC
  Filled 2015-05-08: qty 1

## 2015-05-08 MED ORDER — EPHEDRINE SULFATE 50 MG/ML IJ SOLN
INTRAMUSCULAR | Status: DC | PRN
  Administered 2015-05-08: 5 mg via INTRAVENOUS
  Administered 2015-05-08: 10 mg via INTRAVENOUS

## 2015-05-08 MED ORDER — OXYCODONE-ACETAMINOPHEN 5-325 MG PO TABS: 1.5000 | ORAL_TABLET | Freq: Once | ORAL | Status: DC

## 2015-05-08 MED ORDER — LIDOCAINE HCL (CARDIAC) 20 MG/ML IV SOLN
INTRAVENOUS | Status: DC | PRN
Start: 2015-05-08 — End: 2015-05-08
  Administered 2015-05-08: 40 mg via INTRAVENOUS

## 2015-05-08 MED ORDER — FENTANYL CITRATE (PF) 100 MCG/2ML IJ SOLN
25.0000 ug | INTRAMUSCULAR | Status: DC | PRN
  Administered 2015-05-08 (×2): 50 ug via INTRAVENOUS

## 2015-05-08 MED ORDER — OXYCODONE HCL 5 MG PO TABS: 5.0000 mg | ORAL_TABLET | Freq: Once | ORAL | Status: DC | PRN

## 2015-05-08 MED ORDER — NEOSTIGMINE METHYLSULFATE 10 MG/10ML IV SOLN
INTRAVENOUS | Status: DC | PRN
  Administered 2015-05-08: 3 mg via INTRAVENOUS

## 2015-05-08 MED ORDER — FENTANYL CITRATE (PF) 100 MCG/2ML IJ SOLN
INTRAMUSCULAR | Status: AC
  Administered 2015-05-08: 50 ug via INTRAVENOUS
  Filled 2015-05-08: qty 2

## 2015-05-08 MED ORDER — SODIUM CHLORIDE 0.9 % IJ SOLN
INTRAMUSCULAR | Status: DC | PRN
  Administered 2015-05-08 (×2): 10 mL

## 2015-05-08 MED ORDER — ACETAMINOPHEN 10 MG/ML IV SOLN
INTRAVENOUS | Status: DC | PRN
  Administered 2015-05-08: 1000 mg via INTRAVENOUS

## 2015-05-08 MED ORDER — FENTANYL CITRATE (PF) 100 MCG/2ML IJ SOLN
INTRAMUSCULAR | Status: DC | PRN
  Administered 2015-05-08 (×2): 100 ug via INTRAVENOUS
  Administered 2015-05-08: 50 ug via INTRAVENOUS

## 2015-05-08 MED ORDER — OXYCODONE HCL 5 MG/5ML PO SOLN: 5.0000 mg | Freq: Once | ORAL | Status: DC | PRN

## 2015-05-08 MED ORDER — 0.9 % SODIUM CHLORIDE (POUR BTL) OPTIME
TOPICAL | Status: DC | PRN
  Administered 2015-05-08: 1000 mL

## 2015-05-08 MED ORDER — ACETAMINOPHEN 10 MG/ML IV SOLN
INTRAVENOUS | Status: AC
  Filled 2015-05-08: qty 100

## 2015-05-08 MED ORDER — LIDOCAINE HCL (CARDIAC) 20 MG/ML IV SOLN
INTRAVENOUS | Status: AC
  Filled 2015-05-08: qty 5

## 2015-05-08 MED ORDER — GLYCOPYRROLATE 0.2 MG/ML IJ SOLN
INTRAMUSCULAR | Status: AC
  Filled 2015-05-08: qty 2

## 2015-05-08 MED ORDER — MORPHINE SULFATE (PF) 2 MG/ML IV SOLN: 2.0000 mg | INTRAVENOUS | Status: DC | PRN

## 2015-05-08 MED ORDER — OXYCODONE-ACETAMINOPHEN 7.5-325 MG PO TABS: 1.0000 | ORAL_TABLET | ORAL | Status: DC | PRN

## 2015-05-08 MED ORDER — MIDAZOLAM HCL 2 MG/2ML IJ SOLN
INTRAMUSCULAR | Status: AC
  Filled 2015-05-08: qty 4

## 2015-05-08 MED ORDER — ONDANSETRON HCL 4 MG/2ML IJ SOLN
INTRAMUSCULAR | Status: DC | PRN
  Administered 2015-05-08: 4 mg via INTRAVENOUS

## 2015-05-08 MED ORDER — BUPIVACAINE HCL 0.25 % IJ SOLN
INTRAMUSCULAR | Status: DC | PRN
  Administered 2015-05-08: 30 mL

## 2015-05-08 MED ORDER — ONDANSETRON HCL 4 MG/2ML IJ SOLN
INTRAMUSCULAR | Status: AC
  Filled 2015-05-08: qty 2

## 2015-05-08 MED ORDER — ROCURONIUM BROMIDE 100 MG/10ML IV SOLN
INTRAVENOUS | Status: DC | PRN
  Administered 2015-05-08: 50 mg via INTRAVENOUS

## 2015-05-08 MED ORDER — ROCURONIUM BROMIDE 50 MG/5ML IV SOLN
INTRAVENOUS | Status: AC
  Filled 2015-05-08: qty 1

## 2015-05-08 MED ORDER — GLYCOPYRROLATE 0.2 MG/ML IJ SOLN
INTRAMUSCULAR | Status: DC | PRN
  Administered 2015-05-08: 0.4 mg via INTRAVENOUS

## 2015-05-08 MED ORDER — FENTANYL CITRATE (PF) 250 MCG/5ML IJ SOLN
INTRAMUSCULAR | Status: AC
  Filled 2015-05-08: qty 5

## 2015-05-08 MED ORDER — LACTATED RINGERS IV SOLN
INTRAVENOUS | Status: DC
  Administered 2015-05-08 (×2): via INTRAVENOUS

## 2015-05-08 MED ORDER — OXYCODONE HCL 5 MG PO TABS
ORAL_TABLET | ORAL | Status: AC
  Administered 2015-05-08: 10 mg via ORAL
  Filled 2015-05-08: qty 2

## 2015-05-08 MED ORDER — PROPOFOL 10 MG/ML IV BOLUS
INTRAVENOUS | Status: DC | PRN
  Administered 2015-05-08: 170 mg via INTRAVENOUS

## 2015-05-08 MED ORDER — NEOSTIGMINE METHYLSULFATE 10 MG/10ML IV SOLN
INTRAVENOUS | Status: AC
  Filled 2015-05-08: qty 1

## 2015-05-08 MED ORDER — PROPOFOL 10 MG/ML IV BOLUS
INTRAVENOUS | Status: AC
  Filled 2015-05-08: qty 20

## 2015-05-08 SURGICAL SUPPLY — 51 items
APPLIER CLIP 5 13 M/L LIGAMAX5 (MISCELLANEOUS)
BENZOIN TINCTURE PRP APPL 2/3 (GAUZE/BANDAGES/DRESSINGS) ×3 IMPLANT
CANISTER SUCTION 2500CC (MISCELLANEOUS) IMPLANT
CHLORAPREP W/TINT 26ML (MISCELLANEOUS) ×3 IMPLANT
CLIP APPLIE 5 13 M/L LIGAMAX5 (MISCELLANEOUS) IMPLANT
CLOSURE WOUND 1/2 X4 (GAUZE/BANDAGES/DRESSINGS) ×1
COVER SURGICAL LIGHT HANDLE (MISCELLANEOUS) ×3 IMPLANT
DEVICE SECURE STRAP 25 ABSORB (INSTRUMENTS) ×6 IMPLANT
DEVICE TROCAR PUNCTURE CLOSURE (ENDOMECHANICALS) ×3 IMPLANT
DRAPE LAPAROSCOPIC ABDOMINAL (DRAPES) ×3 IMPLANT
DRAPE UTILITY XL STRL (DRAPES) ×6 IMPLANT
ELECT REM PT RETURN 9FT ADLT (ELECTROSURGICAL) ×3
ELECTRODE REM PT RTRN 9FT ADLT (ELECTROSURGICAL) ×1 IMPLANT
GAUZE SPONGE 2X2 8PLY STRL LF (GAUZE/BANDAGES/DRESSINGS) ×1 IMPLANT
GLOVE BIO SURGEON STRL SZ7 (GLOVE) ×9 IMPLANT
GLOVE BIO SURGEON STRL SZ7.5 (GLOVE) ×3 IMPLANT
GLOVE BIOGEL PI IND STRL 7.0 (GLOVE) ×3 IMPLANT
GLOVE BIOGEL PI IND STRL 7.5 (GLOVE) ×2 IMPLANT
GLOVE BIOGEL PI IND STRL 8 (GLOVE) ×1 IMPLANT
GLOVE BIOGEL PI INDICATOR 7.0 (GLOVE) ×6
GLOVE BIOGEL PI INDICATOR 7.5 (GLOVE) ×4
GLOVE BIOGEL PI INDICATOR 8 (GLOVE) ×2
GLOVE ECLIPSE 7.5 STRL STRAW (GLOVE) ×3 IMPLANT
GOWN STRL REUS W/ TWL LRG LVL3 (GOWN DISPOSABLE) ×5 IMPLANT
GOWN STRL REUS W/ TWL XL LVL3 (GOWN DISPOSABLE) ×2 IMPLANT
GOWN STRL REUS W/TWL LRG LVL3 (GOWN DISPOSABLE) ×10
GOWN STRL REUS W/TWL XL LVL3 (GOWN DISPOSABLE) ×4
KIT BASIN OR (CUSTOM PROCEDURE TRAY) ×3 IMPLANT
KIT ROOM TURNOVER OR (KITS) ×3 IMPLANT
MESH VENTRALIGHT ST 6IN CRC (Mesh General) ×3 IMPLANT
NEEDLE 22X1 1/2 (OR ONLY) (NEEDLE) ×3 IMPLANT
NEEDLE INSUFFLATION 14GA 120MM (NEEDLE) ×3 IMPLANT
NS IRRIG 1000ML POUR BTL (IV SOLUTION) ×3 IMPLANT
PAD ARMBOARD 7.5X6 YLW CONV (MISCELLANEOUS) ×6 IMPLANT
SCALPEL HARMONIC ACE (MISCELLANEOUS) IMPLANT
SCISSORS LAP 5X35 DISP (ENDOMECHANICALS) ×3 IMPLANT
SET IRRIG TUBING LAPAROSCOPIC (IRRIGATION / IRRIGATOR) IMPLANT
SLEEVE ENDOPATH XCEL 5M (ENDOMECHANICALS) ×6 IMPLANT
SPONGE GAUZE 2X2 STER 10/PKG (GAUZE/BANDAGES/DRESSINGS) ×2
STRIP CLOSURE SKIN 1/2X4 (GAUZE/BANDAGES/DRESSINGS) ×2 IMPLANT
SUT CHROMIC 2 0 SH (SUTURE) ×3 IMPLANT
SUT ETHIBOND NAB CT1 #1 30IN (SUTURE) ×12 IMPLANT
SUT MNCRL AB 3-0 PS2 18 (SUTURE) ×3 IMPLANT
SUT PROLENE 2 0 KS (SUTURE) ×6 IMPLANT
TAPE CLOTH SURG 4X10 WHT LF (GAUZE/BANDAGES/DRESSINGS) ×3 IMPLANT
TOWEL OR 17X24 6PK STRL BLUE (TOWEL DISPOSABLE) ×3 IMPLANT
TOWEL OR 17X26 10 PK STRL BLUE (TOWEL DISPOSABLE) ×3 IMPLANT
TRAY LAPAROSCOPIC MC (CUSTOM PROCEDURE TRAY) ×3 IMPLANT
TROCAR XCEL NON-BLD 11X100MML (ENDOMECHANICALS) IMPLANT
TROCAR XCEL NON-BLD 5MMX100MML (ENDOMECHANICALS) ×3 IMPLANT
TUBING INSUFFLATION (TUBING) ×3 IMPLANT

## 2015-05-08 NOTE — Op Note (Signed)
05/08/2015  12:58 PM  PATIENT:  Jasmine [name]  49 y.o. female  PRE-OPERATIVE DIAGNOSIS:  RECURRENT VENTRAL HERNIA  POST-OPERATIVE DIAGNOSIS:  RECURRENT VENTRAL HERNIA  PROCEDURE:  Procedure(s): LAPAROSCOPIC VENTRAL HERNIA REPAIR WITH MESH (N/A) LAPAROSCOPIC LYSIS OF ADHESIONS (N/A) INSERTION OF MESH (N/A)  SURGEON:  Surgeon(s) and Role:    * Axel Filler, MD - Primary  ANESTHESIA:   local and general  EBL:   10cc  BLOOD ADMINISTERED:none  DRAINS: none   LOCAL MEDICATIONS USED:  BUPIVICAINE  and OTHER Exparil  SPECIMEN:  No Specimen  DISPOSITION OF SPECIMEN:  N/A  COUNTS:  YES  TOURNIQUET:  * No tourniquets in log *  DICTATION: .Dragon Dictation  Details of the procedure:   After the patient was consented patient was taken back to the operating room patient was then placed in supine position bilateral SCDs in place.  The patient was prepped and draped in the usual sterile fashion. After antibiotics were confirmed a timeout was called and all facts were verified. The Veress needle technique was used to insuflate the abdomen at Palmer's point. The abdomen was insufflated to 14 mm mercury. Subsequently a 5 mm trocar was placed a camera inserted there was no injury to any intra-abdominal organs.    There was seen to be an recurrent  umbilical hernia.  It appeared that the lateral piece of mesh had pulled away from the Left side.  The prolene stitch appeared to have popped.   A second camera port was in placed into the left lower quadrant.    A 5mm port was placed in the left lower quadrant..   I proceeded to reduce the hernia contents.  A portion of the mesh had to be cut away that was herniated into the hernia sac.  The fibrous portion of the hernia sac was also cut away sharply.  Once the hernia was cleared away, a Bard Ventralight 15.2cm  mesh was inserted into the abdomen.  The mesh was secured circumferentially with am Securestrap tacker in a double crown fashion.   2-0 prolenes were used at 3:00, 6:00, 9:00, and 12:00 as transfascial sutures using a endoclose decive. Exparil was used to infiltrated the transfascial suture sites as well as preperitoneally around the mesh.  The omentum was brought over the area of the mesh. The pneumoperitoneum was evacuated  & all trocars  were removed. The skin was reapproximated with 4-0  Monocryl sutures in a subcuticular fashion. The skin was dressed with Steri-Strips tape and gauze.  The patient was taken to the recovery room in stable condition.   PLAN OF CARE: Discharge to home after PACU  PATIENT DISPOSITION:  PACU - hemodynamically stable.   Delay start of Pharmacological VTE agent (>24hrs) due to surgical blood loss or risk of bleeding: not applicable

## 2015-05-08 NOTE — Anesthesia Procedure Notes (Signed)
Procedure Name: Intubation Date/Time: 05/08/2015 11:16 AM Performed by: Arlice Colt B Pre-anesthesia Checklist: Patient identified, Emergency Drugs available, Suction available, Patient being monitored and Timeout performed Patient Re-evaluated:Patient Re-evaluated prior to inductionOxygen Delivery Method: Circle system utilized Preoxygenation: Pre-oxygenation with 100% oxygen Intubation Type: IV induction Ventilation: Mask ventilation without difficulty and Oral airway inserted - appropriate to patient size Laryngoscope Size: Mac and 3 Grade View: Grade I Tube type: Oral Tube size: 7.0 mm Number of attempts: 1 Airway Equipment and Method: Stylet Placement Confirmation: ETT inserted through vocal cords under direct vision,  positive ETCO2 and breath sounds checked- equal and bilateral Secured at: 21 cm Tube secured with: Tape Dental Injury: Teeth and Oropharynx as per pre-operative assessment

## 2015-05-08 NOTE — Anesthesia Preprocedure Evaluation (Signed)
Anesthesia Evaluation  Patient identified by MRN, date of birth, ID band Patient awake    Reviewed: Allergy & Precautions, NPO status , Patient's Chart, lab work & pertinent test results  Airway Mallampati: II  TM Distance: >3 FB Neck ROM: Full    Dental  (+) Edentulous Upper, Edentulous Lower   Pulmonary Current Smoker,    breath sounds clear to auscultation       Cardiovascular hypertension,  Rhythm:Regular Rate:Normal     Neuro/Psych    GI/Hepatic   Endo/Other  diabetes  Renal/GU      Musculoskeletal   Abdominal (+) + obese,   Peds  Hematology   Anesthesia Other Findings   Reproductive/Obstetrics                             Anesthesia Physical Anesthesia Plan  ASA: III  Anesthesia Plan: General   Post-op Pain Management:    Induction: Intravenous  Airway Management Planned: Oral ETT  Additional Equipment:   Intra-op Plan:   Post-operative Plan: Extubation in OR  Informed Consent: I have reviewed the patients History and Physical, chart, labs and discussed the procedure including the risks, benefits and alternatives for the proposed anesthesia with the patient or authorized representative who has indicated his/her understanding and acceptance.     Plan Discussed with: CRNA and Anesthesiologist  Anesthesia Plan Comments: (Type 2 DM glucose 119 Hypertension Smoker Renal insufficiency Cr-1.16 Obesity  Plan GA with oral ETT  Kipp Brood)        Anesthesia Quick Evaluation

## 2015-05-08 NOTE — Anesthesia Postprocedure Evaluation (Signed)
  Anesthesia Post-op Note  Patient: [name]  Procedure(s) Performed: Procedure(s): LAPAROSCOPIC VENTRAL HERNIA REPAIR WITH MESH (N/A) LAPAROSCOPIC LYSIS OF ADHESIONS (N/A) INSERTION OF MESH (N/A)  Patient Location: PACU  Anesthesia Type:General  Level of Consciousness: awake and alert   Airway and Oxygen Therapy: Patient Spontanous Breathing  Post-op Pain: Controlled  Post-op Assessment: Post-op Vital signs reviewed, Patient's Cardiovascular Status Stable and Respiratory Function Stable  Post-op Vital Signs: Reviewed  Filed Vitals:   05/08/15 1514  BP: 122/58  Pulse: 59  Temp:   Resp: 15    Complications: No apparent anesthesia complications

## 2015-05-08 NOTE — Discharge Instructions (Signed)
[facility] _______[facility] ° °VENTRAL HERNIA REPAIR: POST OP INSTRUCTIONS ° °Always review your discharge instruction sheet given to you by the facility where your surgery was performed. °IF YOU HAVE DISABILITY OR FAMILY LEAVE FORMS, YOU MUST BRING THEM TO THE OFFICE FOR PROCESSING.   °DO NOT GIVE THEM TO YOUR DOCTOR. ° °1. A  prescription for pain medication may be given to you upon discharge.  Take your pain medication as prescribed, if needed.  If narcotic pain medicine is not needed, then you may take acetaminophen (Tylenol) or ibuprofen (Advil) as needed. °2. Take your usually prescribed medications unless otherwise directed. °3. If you need a refill on your pain medication, please contact your pharmacy.  They will contact our office to request authorization. Prescriptions will not be filled after 5 pm or on week-ends. °4. You should follow a light diet the first 24 hours after arrival home, such as soup and crackers, etc.  Be sure to include lots of fluids daily.  Resume your normal diet the day after surgery. °5. Most patients will experience some swelling and bruising around the umbilicus or in the groin and scrotum.  Ice packs and reclining will help.  Swelling and bruising can take several days to resolve.  °6. It is common to experience some constipation if taking pain medication after surgery.  Increasing fluid intake and taking a stool softener (such as Colace) will usually help or prevent this problem from occurring.  A mild laxative (Milk of Magnesia or Miralax) should be taken according to package directions if there are no bowel movements after 48 hours. °7. Unless discharge instructions indicate otherwise, you may remove your bandages 24-48 hours after surgery, and you may shower at that time.  You may have steri-strips (small skin tapes) in place directly over the incision.  These strips should be left on the skin for 7-10 days.  If your surgeon used skin glue on the incision, you may  shower in 24 hours.  The glue will flake off over the next 2-3 weeks.  Any sutures or staples will be removed at the office during your follow-up visit. °8. ACTIVITIES:  You may resume regular (light) daily activities beginning the next day--such as daily self-care, walking, climbing stairs--gradually increasing activities as tolerated.  You may have sexual intercourse when it is comfortable.  Refrain from any heavy lifting or straining until approved by your doctor. °a. You may drive when you are no longer taking prescription pain medication, you can comfortably wear a seatbelt, and you can safely maneuver your car and apply brakes. °b. RETURN TO WORK:  __________________________________________________________ °9. You should see your doctor in the office for a follow-up appointment approximately 2-3 weeks after your surgery.  Make sure that you call for this appointment within a day or two after you arrive home to insure a convenient appointment time. °10. OTHER INSTRUCTIONS:  __________________________________________________________________________________________________________________________________________________________________________________________  °WHEN TO CALL YOUR DOCTOR: °1. Fever over 101.0 °2. Inability to urinate °3. Nausea and/or vomiting °4. Extreme swelling or bruising °5. Continued bleeding from incision. °6. Increased pain, redness, or drainage from the incision ° °The clinic staff is available to answer your questions during regular business hours.  Please don’t hesitate to call and ask to speak to one of the nurses for clinical concerns.  If you have a medical emergency, go to the nearest emergency room or call 911.  A surgeon from [facility] is always on call at the hospital ° ° °[address], [address], [place], [place]  [address] ? ° [address] [address], [place], [place]   [address] °[phone] ? [phone] ? FAX [phone] °Web site:  [email or web address] ° °

## 2015-05-08 NOTE — H&P (View-Only) (Signed)
  History of Present Illness Axel Filler MD; 04/12/2015 12:16 PM) Patient words: post op hernia.  The patient is a 49 year old female who presents with an incisional hernia. Patient comes in today secondary to follow-up for smoking cessation. Patient does state that she is stop smoking. Patient has gained approximately 3 pounds she states that her clothing. The patient is scheduled for surgery on the 28th of this month.   Allergies Zella Ball Gwynn, RMA; 04/12/2015 12:05 PM) Effexor XR *ANTIDEPRESSANTS* Egg MetFORMIN HCl *CHEMICALS*  Medication History Zella Ball Gwynn, RMA; 04/12/2015 12:07 PM) ZyrTEC Allergy (  Capsule, Oral) Active. Flonase (50MCG/ACT Suspension, Nasal) Active. BD Pen Needle Nano U/F (32G X 4 MM Misc,) Active. Cozaar (  Tablet, Oral) Active. OneTouch Delica Lancets Fine Active. Zoloft (  Tablet, Oral) Active. Zocor (  Tablet, Oral) Active. Januvia (  Tablet, Oral) Active. TraMADol HCl (  Tablet, Oral) Active. Toujeo SoloStar (300UNIT/ML Soln Pen-inj, Subcutaneous) Active. Medications Reconciled  Vitals (Robin Gwynn RMA; 04/12/2015 12:07 PM) 04/12/2015 12:07 PM Weight: 251 lb Height: 62in Body Surface Area: 2.23 m Body Mass Index: 45.91 kg/m Temp.: 98.70F  Pulse: 72 (Regular)  BP: 124/80 (Sitting, Left Arm, Standard)    Physical Exam Axel Filler MD; 04/12/2015 12:15 PM) General Mental Status-Alert. General Appearance-Consistent with stated age. Hydration-Well hydrated. Voice-Normal.  Head and Neck Head-normocephalic, atraumatic with no lesions or palpable masses. Trachea-midline. Thyroid Gland Characteristics - normal size and consistency.  Chest and Lung Exam Chest and lung exam reveals -quiet, even and easy respiratory effort with no use of accessory muscles and on auscultation, normal breath sounds, no adventitious sounds and normal vocal resonance. Inspection Chest Wall - Normal. Back -  normal.  Cardiovascular Cardiovascular examination reveals -normal heart sounds, regular rate and rhythm with no murmurs and normal pedal pulses bilaterally.  Abdomen Inspection Skin - Scar - no surgical scars. Hernias - Ventral hernia - Incarcerated. Palpation/Percussion Normal exam - Soft, Non Tender, No Rebound tenderness, No Rigidity (guarding) and No hepatosplenomegaly. Auscultation Normal exam - Bowel sounds normal.    Assessment & Plan Axel Filler MD; 04/12/2015 12:17 PM) RECURRENT VENTRAL HERNIA WITH INCARCERATION (552.21  K43.0) Impression: Patient is a 49 year old female with a recurrent ventral hernia.  1. Will proceed to the operating room for a lap lysis of adhesions and ventral hernia repair with mesh. 2. All risks and benefits were discussed with the patient to generally include, but not limited to: infection, bleeding, damage to surrounding structures, acute and chronic nerve pain, and recurrence. Alternatives were offered and described. All questions were answered and the patient voiced understanding of the procedure and wishes to proceed at this point with hernia repair.

## 2015-05-08 NOTE — Progress Notes (Signed)
Patient started to feel like her sugar was dropping so CBG taken reading 96.

## 2015-05-08 NOTE — Interval H&P Note (Signed)
History and Physical Interval Note:  05/08/2015 11:01 AM  Jasmine [name]  has presented today for surgery, with the diagnosis of RECURRENT VENTRAL HERNIA  The various methods of treatment have been discussed with the patient and family. After consideration of risks, benefits and other options for treatment, the patient has consented to  Procedure(s): LAPAROSCOPIC VENTRAL HERNIA REPAIR WITH MESH (N/A) LAPAROSCOPIC LYSIS OF ADHESIONS (N/A) INSERTION OF MESH (N/A) as a surgical intervention .  The patient's history has been reviewed, patient examined, no change in status, stable for surgery.  I have reviewed the patient's chart and labs.  Questions were answered to the patient's satisfaction.     Marigene Ehlers., Jed Limerick

## 2015-05-08 NOTE — Transfer of Care (Signed)
Immediate Anesthesia Transfer of Care Note  Patient: [name]  Procedure(s) Performed: Procedure(s): LAPAROSCOPIC VENTRAL HERNIA REPAIR WITH MESH (N/A) LAPAROSCOPIC LYSIS OF ADHESIONS (N/A) INSERTION OF MESH (N/A)  Patient Location: PACU  Anesthesia Type:General  Level of Consciousness: awake, alert  and oriented  Airway & Oxygen Therapy: Patient Spontanous Breathing and Patient connected to nasal cannula oxygen  Post-op Assessment: Report given to RN and Post -op Vital signs reviewed and stable  Post vital signs: Reviewed and stable  Last Vitals:  Filed Vitals:   05/08/15 1318  BP: 116/63  Pulse: 62  Temp: 36.9 C  Resp: 20    Complications: No apparent anesthesia complications

## 2015-05-09 ENCOUNTER — Encounter (HOSPITAL_COMMUNITY): Payer: Self-pay | Admitting: General Surgery

## 2015-06-21 ENCOUNTER — Ambulatory Visit (INDEPENDENT_AMBULATORY_CARE_PROVIDER_SITE_OTHER): Payer: 59 | Admitting: Family Medicine

## 2015-06-21 ENCOUNTER — Encounter: Payer: Self-pay | Admitting: Family Medicine

## 2015-06-21 VITALS — BP 145/97 | HR 78 | Wt 256.0 lb

## 2015-06-21 DIAGNOSIS — E1122 Type 2 diabetes mellitus with diabetic chronic kidney disease: Secondary | ICD-10-CM | POA: Diagnosis not present

## 2015-06-21 DIAGNOSIS — K432 Incisional hernia without obstruction or gangrene: Secondary | ICD-10-CM

## 2015-06-21 DIAGNOSIS — I10 Essential (primary) hypertension: Secondary | ICD-10-CM

## 2015-06-21 MED ORDER — INSULIN GLARGINE 300 UNIT/ML ~~LOC~~ SOPN: 40.0000 [IU] | PEN_INJECTOR | Freq: Every day | SUBCUTANEOUS | Status: DC

## 2015-06-21 MED ORDER — SERTRALINE HCL 100 MG PO TABS: ORAL_TABLET | ORAL | Status: DC

## 2015-06-21 NOTE — Progress Notes (Signed)
CC: Jasmine [name] is a 49 y.o. female is here for Hyperglycemia   Subjective: HPI:  Follow-up essential hypertension taking losartan on a daily basis. No outside blood pressures to report. No formal physical activity routine due to recovering hernia repair. No formal sodium restrictions. Denies chest pain shortness of breath orthopnea nor peripheral edema  Follow-up type 2 diabetes: She's been taking 40 units of Toujeo on a daily basis along with Januvia. Fasting blood sugars have been ranging from 100-111. Denies polyuria polyphagia or polydipsia.  Follow-up ventral hernia: She tells me she still experiencing some pain in the right lower quadrant and slowly improving. She is still under restrictions regarding no heavy lifting. She tells me that her gastroenterologist has recommended that it safe for her to go back to work after November.   Review Of Systems Outlined In HPI  Past Medical History  Diagnosis Date  . Hypercholesteremia   . Hypertension   . Umbilical hernia without mention of obstruction or gangrene   . Depression   . Seasonal allergies   . Wears glasses   . Numbness and tingling of right arm and leg   . Type II diabetes mellitus (HCC)   . Arthritis     "knees" (03/28/2014)  . Chronic renal insufficiency     stage II (mild)  . Anxiety   . Headache     with high blood sugar    Past Surgical History  Procedure Laterality Date  . Multiple tooth extractions  ~ 2003  . Umbilical hernia repair  1973  . Tonsillectomy    . Ventral hernia repair  03/27/2014  . Hernia repair    . Ventral hernia repair N/A 03/28/2014    Procedure: LAPAROSCOPIC VENTRAL HERNIA;  Surgeon: Axel FillerArmando Ramirez, MD;  Location: [facility] OR;  Service: General;  Laterality: N/A;  . Insertion of mesh N/A 03/28/2014    Procedure: INSERTION OF MESH;  Surgeon: Axel FillerArmando Ramirez, MD;  [facility];  Service: General;  Laterality: N/A;  . Ventral hernia repair N/A 05/08/2015    Procedure: LAPAROSCOPIC VENTRAL  HERNIA REPAIR WITH MESH;  Surgeon: Axel FillerArmando Ramirez, MD;  Location: [facility] OR;  Service: General;  Laterality: N/A;  . Laparoscopic lysis of adhesions N/A 05/08/2015    Procedure: LAPAROSCOPIC LYSIS OF ADHESIONS;  Surgeon: Axel FillerArmando Ramirez, MD;  Location: [facility] OR;  Service: General;  Laterality: N/A;  . Insertion of mesh N/A 05/08/2015    Procedure: INSERTION OF MESH;  Surgeon: Axel FillerArmando Ramirez, MD;  Location: [facility] OR;  Service: General;  Laterality: N/A;   Family History  Problem Relation Age of Onset  . Diabetes Mother   . Diabetes Father   . Cancer Father     Prostate cancer  . Cancer Sister     Cervical cancer  . Diabetes Brother   . Diabetes Sister   . Diabetes Brother     Social History   Social History  . Marital Status: Married    Spouse Name: N/A  . Number of Children: N/A  . Years of Education: N/A   Occupational History  . Not on file.   Social History Main Topics  . Smoking status: Current Some Day Smoker -- 0.25 packs/day for 26 years    Types: Cigarettes  . Smokeless tobacco: Never Used     Comment: down to 1 cigarette a week  . Alcohol Use: No     Comment: 03/28/2014 "have a drink a few times/year"  . Drug Use: No  . Sexual Activity: Not  Currently   Other Topics Concern  . Not on file   Social History Narrative     Objective: BP 145/97 mmHg  Pulse 78  Wt 256 lb (116.121 kg)  Vital signs reviewed. General: Alert and Oriented, No Acute Distress HEENT: Pupils equal, round, reactive to light. Conjunctivae clear.  External ears unremarkable.  Moist mucous membranes. Lungs: Clear and comfortable work of breathing, speaking in full sentences without accessory muscle use. Cardiac: Regular rate and rhythm.  Neuro: CN II-XII grossly intact, gait normal. Extremities: No peripheral edema.  Strong peripheral pulses.  Mental Status: No depression, anxiety, nor agitation. Logical though process. Skin: Warm and dry. Umbilical surgical site and site just inches to the right  of this looked clean dry intact without any discharge. No signs of infection.   Assessment & Plan: Lakisa was seen today for hyperglycemia.  Diagnoses and all orders for this visit:  Type 2 diabetes mellitus with chronic kidney disease, without long-term current use of insulin, unspecified CKD stage (HCC) -     Insulin Glargine (TOUJEO SOLOSTAR) 300 UNIT/ML SOPN; Inject 40 Units into the skin daily.  Ventral hernia, recurrent  Essential hypertension  Other orders -     sertraline (ZOLOFT) 100 MG tablet; Half tab by mouth daily.   Type 2 diabetes: Control, her vision is 20/30 and from a vision standpoint if it safe for her to go back to work. Ventral hernia: Stable, improving, per her report her surgeon has cleared to return to work after November concludes, I will contact her employer that she'll be free to go back to work starting 07/11/2015. Essential hypertension: Uncontrolled, Continue losartan, focus on DASH diet and less than 2000 mg of sodium daily basis.  Return in about 2 months (around 08/21/2015).

## 2015-06-27 ENCOUNTER — Telehealth: Payer: Self-pay

## 2015-06-27 NOTE — Telephone Encounter (Signed)
Pt.notified

## 2015-06-27 NOTE — Telephone Encounter (Signed)
[facility] is asking for more information on why she is to stay out of work til Dec. 1st.

## 2015-06-27 NOTE — Telephone Encounter (Signed)
Unless they specified what exactly they want will you please fax them a copy of her most recent office note with me.

## 2015-07-08 ENCOUNTER — Other Ambulatory Visit: Payer: Self-pay

## 2015-07-08 MED ORDER — SIMVASTATIN 10 MG PO TABS: 10.0000 mg | ORAL_TABLET | Freq: Every evening | ORAL | Status: DC

## 2015-07-16 ENCOUNTER — Other Ambulatory Visit: Payer: Self-pay

## 2015-07-16 DIAGNOSIS — E1122 Type 2 diabetes mellitus with diabetic chronic kidney disease: Secondary | ICD-10-CM

## 2015-07-16 MED ORDER — INSULIN GLARGINE 300 UNIT/ML ~~LOC~~ SOPN: 40.0000 [IU] | PEN_INJECTOR | Freq: Every day | SUBCUTANEOUS | Status: DC

## 2015-07-16 MED ORDER — LOSARTAN POTASSIUM 50 MG PO TABS: 50.0000 mg | ORAL_TABLET | Freq: Every day | ORAL | Status: DC

## 2015-07-16 MED ORDER — SERTRALINE HCL 100 MG PO TABS: ORAL_TABLET | ORAL | Status: DC

## 2015-07-19 ENCOUNTER — Ambulatory Visit: Payer: 59 | Admitting: Sports Medicine

## 2015-07-25 ENCOUNTER — Ambulatory Visit (INDEPENDENT_AMBULATORY_CARE_PROVIDER_SITE_OTHER): Payer: 59 | Admitting: Sports Medicine

## 2015-07-25 ENCOUNTER — Encounter: Payer: Self-pay | Admitting: Sports Medicine

## 2015-07-25 VITALS — BP 122/76 | HR 85 | Temp 97.9°F | Resp 18 | Wt 258.4 lb

## 2015-07-25 DIAGNOSIS — E1122 Type 2 diabetes mellitus with diabetic chronic kidney disease: Secondary | ICD-10-CM

## 2015-07-25 DIAGNOSIS — M170 Bilateral primary osteoarthritis of knee: Secondary | ICD-10-CM

## 2015-07-25 DIAGNOSIS — Z6841 Body Mass Index (BMI) 40.0 and over, adult: Secondary | ICD-10-CM | POA: Diagnosis not present

## 2015-07-25 MED ORDER — DULAGLUTIDE 1.5 MG/0.5ML ~~LOC~~ SOAJ: 1.5000 mg | SUBCUTANEOUS | Status: DC

## 2015-07-25 MED ORDER — PHENTERMINE HCL 37.5 MG PO TABS: ORAL_TABLET | ORAL | Status: DC

## 2015-07-25 NOTE — Assessment & Plan Note (Signed)
Nine-month response to previous left knee injection, repeat bilateral injection today. Return in one month.

## 2015-07-25 NOTE — Assessment & Plan Note (Signed)
Adding Trulicity, further follow-up with PCP. Advised that she will probably have to down taper her insulin dose.

## 2015-07-25 NOTE — Assessment & Plan Note (Signed)
Restarting phentermine, adding a GLP-1 agonist as well. Follow-up monthly with PCP for weight checks.

## 2015-07-25 NOTE — Progress Notes (Signed)
  Subjective:    CC: Follow-up  HPI: Knee osteoarthritis: Nine-month response to previous injection, desires a repeat today.  Obesity: Agreeable to restart weight loss treatment, this will help her knees.  Past medical history, Surgical history, Family history not pertinant except as noted below, Social history, Allergies, and medications have been entered into the medical record, reviewed, and no changes needed.   Review of Systems: No fevers, chills, night sweats, weight loss, chest pain, or shortness of breath.   Objective:    General: Well Developed, well nourished, and in no acute distress.  Neuro: Alert and oriented x3, extra-ocular muscles intact, sensation grossly intact.  HEENT: Normocephalic, atraumatic, pupils equal round reactive to light, neck supple, no masses, no lymphadenopathy, thyroid nonpalpable.  Skin: Warm and dry, no rashes. Cardiac: Regular rate and rhythm, no murmurs rubs or gallops, no lower extremity edema.  Respiratory: Clear to auscultation bilaterally. Not using accessory muscles, speaking in full sentences.  Procedure: Real-time Ultrasound Guided Injection of left knee Device: GE Logiq E  Verbal informed consent obtained.  Time-out conducted.  Noted no overlying erythema, induration, or other signs of local infection.  Skin prepped in a sterile fashion.  Local anesthesia: Topical Ethyl chloride.  With sterile technique and under real time ultrasound guidance:  1 mL kenalog 40, 2 mL lidocaine, 2 mL Marcaine injected easily. Completed without difficulty  Pain immediately resolved suggesting accurate placement of the medication.  Advised to call if fevers/chills, erythema, induration, drainage, or persistent bleeding.  Images permanently stored and available for review in the ultrasound unit.  Impression: Technically successful ultrasound guided injection.   Procedure: Real-time Ultrasound Guided Injection of right knee Device: GE Logiq E  Verbal  informed consent obtained.  Time-out conducted.  Noted no overlying erythema, induration, or other signs of local infection.  Skin prepped in a sterile fashion.  Local anesthesia: Topical Ethyl chloride.  With sterile technique and under real time ultrasound guidance:  1 mL kenalog 40, 2 mL lidocaine, 2 mL Marcaine injected easily. Completed without difficulty  Pain immediately resolved suggesting accurate placement of the medication.  Advised to call if fevers/chills, erythema, induration, drainage, or persistent bleeding.  Images permanently stored and available for review in the ultrasound unit.  Impression: Technically successful ultrasound guided injection.  Impression and Recommendations:

## 2015-08-15 ENCOUNTER — Telehealth: Payer: Self-pay | Admitting: Family Medicine

## 2015-08-15 ENCOUNTER — Other Ambulatory Visit: Payer: Self-pay

## 2015-08-15 DIAGNOSIS — E1122 Type 2 diabetes mellitus with diabetic chronic kidney disease: Secondary | ICD-10-CM

## 2015-08-15 MED ORDER — DULAGLUTIDE 1.5 MG/0.5ML ~~LOC~~ SOAJ: 1.5000 mg | SUBCUTANEOUS | Status: DC

## 2015-08-15 NOTE — Telephone Encounter (Signed)
Refill req 

## 2015-08-21 ENCOUNTER — Encounter: Payer: Self-pay | Admitting: Family Medicine

## 2015-08-21 ENCOUNTER — Ambulatory Visit (INDEPENDENT_AMBULATORY_CARE_PROVIDER_SITE_OTHER): Payer: 59 | Admitting: Family Medicine

## 2015-08-21 VITALS — BP 153/87 | HR 98 | Wt 245.0 lb

## 2015-08-21 DIAGNOSIS — E1122 Type 2 diabetes mellitus with diabetic chronic kidney disease: Secondary | ICD-10-CM | POA: Diagnosis not present

## 2015-08-21 DIAGNOSIS — I10 Essential (primary) hypertension: Secondary | ICD-10-CM

## 2015-08-21 DIAGNOSIS — Z6841 Body Mass Index (BMI) 40.0 and over, adult: Secondary | ICD-10-CM

## 2015-08-21 DIAGNOSIS — Z794 Long term (current) use of insulin: Secondary | ICD-10-CM

## 2015-08-21 LAB — POCT GLYCOSYLATED HEMOGLOBIN (HGB A1C): Hemoglobin A1C: 7.9

## 2015-08-21 MED ORDER — SIMVASTATIN 10 MG PO TABS: 10.0000 mg | ORAL_TABLET | Freq: Every evening | ORAL | Status: DC

## 2015-08-21 MED ORDER — LOSARTAN POTASSIUM-HCTZ 50-12.5 MG PO TABS: 1.0000 | ORAL_TABLET | Freq: Every day | ORAL | Status: DC

## 2015-08-21 MED ORDER — PHENTERMINE HCL 37.5 MG PO TABS: ORAL_TABLET | ORAL | Status: DC

## 2015-08-21 MED ORDER — DULAGLUTIDE 1.5 MG/0.5ML ~~LOC~~ SOAJ: 1.5000 mg | SUBCUTANEOUS | Status: DC

## 2015-08-21 MED ORDER — SERTRALINE HCL 100 MG PO TABS: ORAL_TABLET | ORAL | Status: DC

## 2015-08-21 NOTE — Progress Notes (Signed)
CC: Jasmine [name] is a 50 y.o. female is here for Hyperglycemia   Subjective: HPI:  Follow-up type 2 diabetes: since starting on Trulicity she's noticed that all blood sugars are below 120. She had one hypoglycemic episode where a blood sugar was 59 and she felt tremors however after eating a snack this went away within a few minutes. She denies any vision disturbance. She denies polyuria polyphagia or polydipsia. She's been taking Januvia with this. She is taking her insulin as prescribed  Follow-up essential hypertension: No outside blood pressures report. She's taking losartan on a daily basis with 100% compliance. She denies chest pain shortness of breath orthopnea or peripheral edema.   Follow-up obesity: Since started on phentermine she's noticed a big difference with appetite suppression. She still eats 3 meals a day however her portions are much smaller. She denies any known side effects. She feels that she is losing weight. No sleep disturbance or anxiety.     Review Of Systems Outlined In HPI  Past Medical History  Diagnosis Date  . Hypercholesteremia   . Hypertension   . Umbilical hernia without mention of obstruction or gangrene   . Depression   . Seasonal allergies   . Wears glasses   . Numbness and tingling of right arm and leg   . Type II diabetes mellitus (HCC)   . Arthritis     "knees" (03/28/2014)  . Chronic renal insufficiency     stage II (mild)  . Anxiety   . Headache     with high blood sugar    Past Surgical History  Procedure Laterality Date  . Multiple tooth extractions  ~ 2003  . Umbilical hernia repair  1973  . Tonsillectomy    . Ventral hernia repair  03/27/2014  . Hernia repair    . Ventral hernia repair N/A 03/28/2014    Procedure: LAPAROSCOPIC VENTRAL HERNIA;  Surgeon: Axel FillerArmando Ramirez, MD;  Location: [facility];  Service: General;  Laterality: N/A;  . Insertion of mesh N/A 03/28/2014    Procedure: INSERTION OF MESH;  Surgeon: Axel FillerArmando Ramirez, MD;   [facility];  Service: General;  Laterality: N/A;  . Ventral hernia repair N/A 05/08/2015    Procedure: LAPAROSCOPIC VENTRAL HERNIA REPAIR WITH MESH;  Surgeon: Axel FillerArmando Ramirez, MD;  Location: [facility] OR;  Service: General;  Laterality: N/A;  . Laparoscopic lysis of adhesions N/A 05/08/2015    Procedure: LAPAROSCOPIC LYSIS OF ADHESIONS;  Surgeon: Axel FillerArmando Ramirez, MD;  Location: [facility] OR;  Service: General;  Laterality: N/A;  . Insertion of mesh N/A 05/08/2015    Procedure: INSERTION OF MESH;  Surgeon: Axel FillerArmando Ramirez, MD;  Location: [facility] OR;  Service: General;  Laterality: N/A;   Family History  Problem Relation Age of Onset  . Diabetes Mother   . Diabetes Father   . Cancer Father     Prostate cancer  . Cancer Sister     Cervical cancer  . Diabetes Brother   . Diabetes Sister   . Diabetes Brother     Social History   Social History  . Marital Status: Married    Spouse Name: N/A  . Number of Children: N/A  . Years of Education: N/A   Occupational History  . Not on file.   Social History Main Topics  . Smoking status: Current Some Day Smoker -- 0.25 packs/day for 26 years    Types: Cigarettes  . Smokeless tobacco: Never Used     Comment: down to 1 cigarette a week  .  Alcohol Use: No     Comment: 03/28/2014 "have a drink a few times/year"  . Drug Use: No  . Sexual Activity: Not Currently   Other Topics Concern  . Not on file   Social History Narrative     Objective: BP 153/87 mmHg  Pulse 98  Wt 245 lb (111.131 kg)  Vital signs reviewed. General: Alert and Oriented, No Acute Distress HEENT: Pupils equal, round, reactive to light. Conjunctivae clear.  External ears unremarkable.  Moist mucous membranes. Lungs: Clear and comfortable work of breathing, speaking in full sentences without accessory muscle use. Cardiac: Regular rate and rhythm.  Neuro: CN II-XII grossly intact, gait normal. Extremities: No peripheral edema.  Strong peripheral pulses.  Mental Status: No  depression, anxiety, nor agitation. Logical though process. Skin: Warm and dry.  Assessment & Plan: Jasmine [name] was seen today for hyperglycemia.  Diagnoses and all orders for this visit:  Type 2 diabetes mellitus with chronic kidney disease, with long-term current use of insulin, unspecified CKD stage (HCC) -     POCT HgB A1C  Essential hypertension  Morbid obesity with BMI of 50.0-59.9, adult (HCC) -     phentermine (ADIPEX-P) 37.5 MG tablet; One tab by mouth qAM  Type 2 diabetes mellitus with chronic kidney disease, without long-term current use of insulin, unspecified CKD stage (HCC) -     Dulaglutide (TRULICITY) 1.5 MG/0.5ML SOPN; Inject 1.5 mg into the skin once a week.  Other orders -     losartan-hydrochlorothiazide (HYZAAR) 50-12.5 MG tablet; Take 1 tablet by mouth daily. -     sertraline (ZOLOFT) 100 MG tablet; Half tab by mouth daily. -     simvastatin (ZOCOR) 10 MG tablet; Take 1 tablet (10 mg total) by mouth every evening.    type 2 diabetes: A1c of 7.9, I'm satisfied with her reduction in A1c given how high it was back in the fall. I believe that her blood sugars are at goal and that the 3 weeks of trulicity is not fully seen on this A1c today. No change in medication regimen other than stopping Januvia since she is taking Trulicity. Continue statin  essential hypertension: Uncontrolled chronic condition continue losartan but adding hydrochlorothiazide to this.   obesity: Improvement with phentermine, refills provided and I prepared her that this medication may lose its effectiveness sometime in the next few months.     Return for 3 month sugar follow up and 1 month nurse visit weight check.

## 2015-09-05 ENCOUNTER — Other Ambulatory Visit: Payer: Self-pay | Admitting: Family Medicine

## 2015-09-05 DIAGNOSIS — E1122 Type 2 diabetes mellitus with diabetic chronic kidney disease: Secondary | ICD-10-CM

## 2015-09-05 MED ORDER — DULAGLUTIDE 1.5 MG/0.5ML ~~LOC~~ SOAJ: 1.5000 mg | SUBCUTANEOUS | Status: DC

## 2015-10-08 ENCOUNTER — Other Ambulatory Visit: Payer: Self-pay | Admitting: Family Medicine

## 2015-10-16 ENCOUNTER — Ambulatory Visit: Payer: 59

## 2015-10-16 ENCOUNTER — Ambulatory Visit (INDEPENDENT_AMBULATORY_CARE_PROVIDER_SITE_OTHER): Payer: 59 | Admitting: Sports Medicine

## 2015-10-16 ENCOUNTER — Telehealth: Payer: Self-pay | Admitting: Sports Medicine

## 2015-10-16 DIAGNOSIS — Z6841 Body Mass Index (BMI) 40.0 and over, adult: Secondary | ICD-10-CM

## 2015-10-16 DIAGNOSIS — M170 Bilateral primary osteoarthritis of knee: Secondary | ICD-10-CM | POA: Diagnosis not present

## 2015-10-16 MED ORDER — PHENTERMINE HCL 37.5 MG PO TABS: ORAL_TABLET | ORAL | Status: DC

## 2015-10-16 NOTE — Assessment & Plan Note (Addendum)
Three-month response to previous bilateral injection. Left knee injection today, right knee is still pain-free, we will also get her set up for Orthovisc. Return to see me as needed.

## 2015-10-16 NOTE — Telephone Encounter (Signed)
Submitted for approval on Orthovisc. Awaiting confirmation.  

## 2015-10-16 NOTE — Assessment & Plan Note (Signed)
Continue phentermine, Trulicity. Further follow-ups with PCP.

## 2015-10-16 NOTE — Progress Notes (Signed)
  Subjective:    CC: Follow-up  HPI: Bilateral knee osteoarthritis: Did well after an injection he months ago, having a recurrence of pain in the left knee, right knee is still pain-free, desires repeat interventional treatment today, pain is moderate, persistent, localized at the medial joint line, no radiation, no mechanical symptoms.  Obesity: Needs a refill on phentermine  Past medical history, Surgical history, Family history not pertinant except as noted below, Social history, Allergies, and medications have been entered into the medical record, reviewed, and no changes needed.   Review of Systems: No fevers, chills, night sweats, weight loss, chest pain, or shortness of breath.   Objective:    General: Well Developed, well nourished, and in no acute distress.  Neuro: Alert and oriented x3, extra-ocular muscles intact, sensation grossly intact.  HEENT: Normocephalic, atraumatic, pupils equal round reactive to light, neck supple, no masses, no lymphadenopathy, thyroid nonpalpable.  Skin: Warm and dry, no rashes. Cardiac: Regular rate and rhythm, no murmurs rubs or gallops, no lower extremity edema.  Respiratory: Clear to auscultation bilaterally. Not using accessory muscles, speaking in full sentences.  Procedure: Real-time Ultrasound Guided Injection of left knee Device: GE Logiq E  Verbal informed consent obtained.  Time-out conducted.  Noted no overlying erythema, induration, or other signs of local infection.  Skin prepped in a sterile fashion.  Local anesthesia: Topical Ethyl chloride.  With sterile technique and under real time ultrasound guidance:  1 mL kenalog 40, 2 mL lidocaine, 2 mL Marcaine injected easily Completed without difficulty  Pain immediately resolved suggesting accurate placement of the medication.  Advised to call if fevers/chills, erythema, induration, drainage, or persistent bleeding.  Images permanently stored and available for review in the ultrasound  unit.  Impression: Technically successful ultrasound guided injection.  Impression and Recommendations:

## 2015-10-16 NOTE — Telephone Encounter (Signed)
-----   Message from Monica Bectonhomas J Thekkekandam, MD sent at 10/16/2015 10:27 AM EST ----- Let's work on [employer] approval for both knees, bilateral x-ray confirmed knee osteoarthritis that has failed steroid injections. ___________________________________________ Ihor Austinhomas J. Benjamin Stainhekkekandam, M.D., ABFM., CAQSM. Primary Care and Sports Medicine [facility] [place]  Adjunct Instructor of Family Medicine  [employer]

## 2015-10-17 NOTE — Telephone Encounter (Signed)
Pt returned clinic call. She would like to wait about 6 months before starting the process. I advised Pt to contact us when she is ready and we can contact the insurance company [phone](P:[phone]) and begin the process.

## 2015-10-17 NOTE — Telephone Encounter (Signed)
Received the following information from the [employer] investigation:   Patient has [employer] POS plan with an effective date of 08/11/2015. Doctor may not buy and bill, medication must be obtained through the specialty pharmacy. Please contact the Orthovisc line if you would like medication transferred to specialty pharmacy. Medical notes must be submitted with the claim. Include medical necessity, diagnosis, and all clinical. [id number] is covered at 90% of the contracted rate when performed in an office setting. *Deductible must be met for coverage to apply. If out of pocket is met, coverage goes to 100%. Reference: 2284  CAREQJ and left VM for Pt to return clinic call. Need to go over Pt's estimated OOP cost for injection fees only. Unsure what Rx cost will be since they have to use [facility]. Will not send medical notes until Pt determines if she would like to proceed.

## 2016-01-06 ENCOUNTER — Other Ambulatory Visit: Payer: Self-pay | Admitting: Family Medicine

## 2016-01-08 ENCOUNTER — Ambulatory Visit: Payer: 59 | Admitting: Family Medicine

## 2016-01-09 ENCOUNTER — Encounter: Payer: Self-pay | Admitting: Family Medicine

## 2016-01-09 ENCOUNTER — Ambulatory Visit (INDEPENDENT_AMBULATORY_CARE_PROVIDER_SITE_OTHER): Payer: 59 | Admitting: Family Medicine

## 2016-01-09 VITALS — BP 165/97 | HR 80 | Wt 250.0 lb

## 2016-01-09 DIAGNOSIS — M170 Bilateral primary osteoarthritis of knee: Secondary | ICD-10-CM | POA: Diagnosis not present

## 2016-01-09 DIAGNOSIS — Z6841 Body Mass Index (BMI) 40.0 and over, adult: Secondary | ICD-10-CM | POA: Diagnosis not present

## 2016-01-09 DIAGNOSIS — E1122 Type 2 diabetes mellitus with diabetic chronic kidney disease: Secondary | ICD-10-CM | POA: Diagnosis not present

## 2016-01-09 MED ORDER — INSULIN GLARGINE 300 UNIT/ML ~~LOC~~ SOPN: 20.0000 [IU] | PEN_INJECTOR | Freq: Every day | SUBCUTANEOUS | Status: DC

## 2016-01-09 MED ORDER — PHENTERMINE HCL 37.5 MG PO TABS: ORAL_TABLET | ORAL | Status: DC

## 2016-01-09 NOTE — Progress Notes (Signed)
CC: Jasmine [name] is a 50 y.o. female is here for complete paperwork   Subjective: HPI:  Follow-up osteoarthritis: She used to get flareups in both knees 1-4 times a month. Episodes last about one 2 days. When the flareups are present she has difficulty sitting still for more than a few minutes due to the pain. Pain is slightly improved she stays active doing non-strenuous activity. She is unable to afford viscous supplementation and she is intimidated about the idea of surgery. She has forms for me to fill out about these flareups that will cause her to miss work.  Follow-up type 2 diabetes: She wants to know if it's okay if she can decrease her toujeo.  She's been having hypoglycemic episodes in the evening and fasting blood sugars are never above 120. She denies any polyuria polydipsia or polyuria.  She also wants to know if she can restart phentermine.  Review Of Systems Outlined In HPI  Past Medical History  Diagnosis Date  . Hypercholesteremia   . Hypertension   . Umbilical hernia without mention of obstruction or gangrene   . Depression   . Seasonal allergies   . Wears glasses   . Numbness and tingling of right arm and leg   . Type II diabetes mellitus (HCC)   . Arthritis     "knees" (03/28/2014)  . Chronic renal insufficiency     stage II (mild)  . Anxiety   . Headache     with high blood sugar    Past Surgical History  Procedure Laterality Date  . Multiple tooth extractions  ~ 2003  . Umbilical hernia repair  1973  . Tonsillectomy    . Ventral hernia repair  03/27/2014  . Hernia repair    . Ventral hernia repair N/A 03/28/2014    Procedure: LAPAROSCOPIC VENTRAL HERNIA;  Surgeon: Axel Filler, MD;  Location: [facility] OR;  Service: General;  Laterality: N/A;  . Insertion of mesh N/A 03/28/2014    Procedure: INSERTION OF MESH;  Surgeon: Axel Filler, MD;  [facility];  Service: General;  Laterality: N/A;  . Ventral hernia repair N/A 05/08/2015    Procedure:  LAPAROSCOPIC VENTRAL HERNIA REPAIR WITH MESH;  Surgeon: Axel Filler, MD;  Location: [facility] OR;  Service: General;  Laterality: N/A;  . Laparoscopic lysis of adhesions N/A 05/08/2015    Procedure: LAPAROSCOPIC LYSIS OF ADHESIONS;  Surgeon: Axel Filler, MD;  Location: [facility] OR;  Service: General;  Laterality: N/A;  . Insertion of mesh N/A 05/08/2015    Procedure: INSERTION OF MESH;  Surgeon: Axel Filler, MD;  Location: [facility];  Service: General;  Laterality: N/A;   Family History  Problem Relation Age of Onset  . Diabetes Mother   . Diabetes Father   . Cancer Father     Prostate cancer  . Cancer Sister     Cervical cancer  . Diabetes Brother   . Diabetes Sister   . Diabetes Brother     Social History   Social History  . Marital Status: Married    Spouse Name: N/A  . Number of Children: N/A  . Years of Education: N/A   Occupational History  . Not on file.   Social History Main Topics  . Smoking status: Current Some Day Smoker -- 0.25 packs/day for 26 years    Types: Cigarettes  . Smokeless tobacco: Never Used     Comment: down to 1 cigarette a week  . Alcohol Use: No     Comment:  03/28/2014 "have a drink a few times/year"  . Drug Use: No  . Sexual Activity: Not Currently   Other Topics Concern  . Not on file   Social History Narrative     Objective: BP 165/97 mmHg  Pulse 80  Wt 250 lb (113.399 kg)  Vital signs reviewed. General: Alert and Oriented, No Acute Distress HEENT: Pupils equal, round, reactive to light. Conjunctivae clear.  External ears unremarkable.  Moist mucous membranes. Lungs: Clear and comfortable work of breathing, speaking in full sentences without accessory muscle use. Cardiac: Regular rate and rhythm.  Neuro: CN II-XII grossly intact, gait normal. Extremities: No peripheral edema.  Strong peripheral pulses.  Mental Status: No depression, anxiety, nor agitation. Logical though process. Skin: Warm and dry.  Assessment & Plan: Jasmine [name] was  seen today for complete paperwork.  Diagnoses and all orders for this visit:  Primary osteoarthritis of both knees  Type 2 diabetes mellitus with chronic kidney disease, without long-term current use of insulin, unspecified CKD stage (HCC) -     Insulin Glargine (TOUJEO SOLOSTAR) 300 UNIT/ML SOPN; Inject 20 Units into the skin daily.  Morbid obesity with BMI of 50.0-59.9, adult (HCC) -     phentermine (ADIPEX-P) 37.5 MG tablet; One tab by mouth qAM   Osteoarthritis of the knees: Time was taken to fill out her paperwork so she can miss work during flareups. Type 2 diabetes: I think it's reasonable to cut back on her insulin, start taking 20 units daily. Recheck A1c in 3 months Obesity: Refill request for phentermine approved. Follow-up in one month.  25 minutes spent face-to-face during visit today of which at least 50% was counseling or coordinating care regarding: 1. Primary osteoarthritis of both knees   2. Type 2 diabetes mellitus with chronic kidney disease, without long-term current use of insulin, unspecified CKD stage (HCC)   3. Morbid obesity with BMI of 50.0-59.9, adult (HCC)      Return in about 3 months (around 04/10/2016) for sugar.

## 2016-01-13 ENCOUNTER — Telehealth: Payer: Self-pay | Admitting: *Deleted

## 2016-01-13 NOTE — Telephone Encounter (Signed)
Prior Auth initiated for [employer]ellation Energytoujeo  (Key: [id number]

## 2016-01-15 MED ORDER — INSULIN DEGLUDEC 100 UNIT/ML ~~LOC~~ SOPN: 20.0000 [IU] | PEN_INJECTOR | Freq: Every day | SUBCUTANEOUS | Status: DC

## 2016-01-15 NOTE — Telephone Encounter (Signed)
toujeo denied. Preferred is levemir,basaglar,tresiba.

## 2016-01-15 NOTE — Telephone Encounter (Signed)
Jasmine [name], Will you please let patient know that her insurance recently changed their formulary and now are no longer covering Toujeo.  Luckly they will cover a competitors insulin called tresiba that works just as well. I'll print out a new Rx for her and would recommend she pickup a savings voucher to use for this Rx.

## 2016-01-16 NOTE — Telephone Encounter (Signed)
Pt notified of medication changes. Pre pt request Rx faxed

## 2016-02-16 ENCOUNTER — Other Ambulatory Visit: Payer: Self-pay | Admitting: Family Medicine

## 2016-02-19 ENCOUNTER — Ambulatory Visit (INDEPENDENT_AMBULATORY_CARE_PROVIDER_SITE_OTHER): Payer: 59 | Admitting: Family Medicine

## 2016-02-19 ENCOUNTER — Encounter: Payer: Self-pay | Admitting: Family Medicine

## 2016-02-19 VITALS — BP 135/81 | HR 125 | Wt 245.0 lb

## 2016-02-19 DIAGNOSIS — E1122 Type 2 diabetes mellitus with diabetic chronic kidney disease: Secondary | ICD-10-CM | POA: Diagnosis not present

## 2016-02-19 DIAGNOSIS — F411 Generalized anxiety disorder: Secondary | ICD-10-CM

## 2016-02-19 DIAGNOSIS — Z794 Long term (current) use of insulin: Secondary | ICD-10-CM

## 2016-02-19 DIAGNOSIS — H5462 Unqualified visual loss, left eye, normal vision right eye: Secondary | ICD-10-CM

## 2016-02-19 DIAGNOSIS — I10 Essential (primary) hypertension: Secondary | ICD-10-CM

## 2016-02-19 DIAGNOSIS — Z6841 Body Mass Index (BMI) 40.0 and over, adult: Secondary | ICD-10-CM

## 2016-02-19 LAB — POCT GLYCOSYLATED HEMOGLOBIN (HGB A1C): Hemoglobin A1C: 6.6

## 2016-02-19 MED ORDER — DULOXETINE HCL 20 MG PO CPEP
20.0000 mg | ORAL_CAPSULE | Freq: Every day | ORAL | Status: DC
Start: 2016-02-19 — End: 2016-04-20

## 2016-02-19 MED ORDER — PHENTERMINE HCL 37.5 MG PO TABS: ORAL_TABLET | ORAL | Status: DC

## 2016-02-19 NOTE — Progress Notes (Signed)
CC: Jasmine [name] is a 50 y.o. female is here for Dizziness and Headache   Subjective: HPI:  Follow-up type 2 diabetes: She has not been checking her blood pressures outside of our office. She did have one hypoglycemic episode that responded to Pepsi. She denies any polyuria polyphagia polydipsia but has had some blurred vision. She tells me that she feels that her left eye is more blurry than the right. She's not having any difficulty with looking at the screen at work. She's not seen her eye doctor in over a year now. She is tolerating all of her injectable and oral diabetic medications.  Follow essential hypertension: She is taking losartan-hydrochlorothiazide on a daily basis. If she stands up quickly she gets lightheaded. She denies any chest pain shortness of breath orthopnea nor peripheral edema.  She tells me that his complaint today is some anxiety that she's been feeling. She feels like she is inches, irritable towards others and has a short fuse. Her daughter confirms the same that she broke down and started screaming out of frustration when her hands were full and she dropped her keys. Patient tells me she is not really sure what is causing her anxiety but it's interfering with quality of life and job responsibilities.  She successfully lost 5 pounds with phentermine and would like a refill. She denies any sleep disturbance or excessive appetite suppression   Review Of Systems Outlined In HPI  Past Medical History  Diagnosis Date  . Hypercholesteremia   . Hypertension   . Umbilical hernia without mention of obstruction or gangrene   . Depression   . Seasonal allergies   . Wears glasses   . Numbness and tingling of right arm and leg   . Type II diabetes mellitus (HCC)   . Arthritis     "knees" (03/28/2014)  . Chronic renal insufficiency     stage II (mild)  . Anxiety   . Headache     with high blood sugar    Past Surgical History  Procedure Laterality Date  .  Multiple tooth extractions  ~ 2003  . Umbilical hernia repair  1973  . Tonsillectomy    . Ventral hernia repair  03/27/2014  . Hernia repair    . Ventral hernia repair N/A 03/28/2014    Procedure: LAPAROSCOPIC VENTRAL HERNIA;  Surgeon: Axel Filler, MD;  Location: [facility] OR;  Service: General;  Laterality: N/A;  . Insertion of mesh N/A 03/28/2014    Procedure: INSERTION OF MESH;  Surgeon: Axel Filler, MD;  Location: [facility] OR;  Service: General;  Laterality: N/A;  . Ventral hernia repair N/A 05/08/2015    Procedure: LAPAROSCOPIC VENTRAL HERNIA REPAIR WITH MESH;  Surgeon: Axel Filler, MD;  Location: [facility] OR;  Service: General;  Laterality: N/A;  . Laparoscopic lysis of adhesions N/A 05/08/2015    Procedure: LAPAROSCOPIC LYSIS OF ADHESIONS;  Surgeon: Axel Filler, MD;  Location: [facility] OR;  Service: General;  Laterality: N/A;  . Insertion of mesh N/A 05/08/2015    Procedure: INSERTION OF MESH;  Surgeon: Axel Filler, MD;  Location: [facility] OR;  Service: General;  Laterality: N/A;   Family History  Problem Relation Age of Onset  . Diabetes Mother   . Diabetes Father   . Cancer Father     Prostate cancer  . Cancer Sister     Cervical cancer  . Diabetes Brother   . Diabetes Sister   . Diabetes Brother     Social History   Social History  .  Marital Status: Married    Spouse Name: N/A  . Number of Children: N/A  . Years of Education: N/A   Occupational History  . Not on file.   Social History Main Topics  . Smoking status: Current Some Day Smoker -- 0.25 packs/day for 26 years    Types: Cigarettes  . Smokeless tobacco: Never Used     Comment: down to 1 cigarette a week  . Alcohol Use: No     Comment: 03/28/2014 "have a drink a few times/year"  . Drug Use: No  . Sexual Activity: Not Currently   Other Topics Concern  . Not on file   Social History Narrative     Objective: BP 135/81 mmHg  Pulse 125  Wt 245 lb (111.131 kg)  Vital signs reviewed. General: Alert and Oriented,  No Acute Distress HEENT: Pupils equal, round, reactive to light. Conjunctivae clear.  External ears unremarkable.  Moist mucous membranes. Lungs: Clear and comfortable work of breathing, speaking in full sentences without accessory muscle use. Cardiac: Regular rate and rhythm.  Neuro: CN II-XII grossly intact, gait normal. Extremities: No peripheral edema.  Strong peripheral pulses.  Mental Status: No depression, anxiety, nor agitation. Logical though process. Skin: Warm and dry. Assessment & Plan: Jasmine LagerYolanda was seen today for dizziness and headache.  Diagnoses and all orders for this visit:  Vision loss, left eye  Type 2 diabetes mellitus with chronic kidney disease, with long-term current use of insulin, unspecified CKD stage (HCC) -     POCT HgB A1C  Essential hypertension  Anxiety state -     DULoxetine (CYMBALTA) 20 MG capsule; Take 1 capsule (20 mg total) by mouth daily.  Morbid obesity with BMI of 50.0-59.9, adult (HCC) -     phentermine (ADIPEX-P) 37.5 MG tablet; One tab by mouth qAM   Vision loss of the left I: Cannot attribute this to diabetes, I strongly encouraged her to follow up with her eye doctor ASAP Type 2 diabetes: A1c of 6.7, congratulated her success, no changes to her current antihyperglycemic medications. Anxiety: Uncontrolled chronic condition switching from generic Zoloft to generic Cymbalta Obesity: Improving with phentermine.  Return in about 4 weeks (around 03/18/2016) for mood.

## 2016-03-01 ENCOUNTER — Other Ambulatory Visit: Payer: Self-pay | Admitting: Family Medicine

## 2016-03-03 LAB — HM DIABETES EYE EXAM

## 2016-03-12 ENCOUNTER — Encounter: Payer: Self-pay | Admitting: Family Medicine

## 2016-03-12 DIAGNOSIS — E11319 Type 2 diabetes mellitus with unspecified diabetic retinopathy without macular edema: Secondary | ICD-10-CM | POA: Insufficient documentation

## 2016-03-21 ENCOUNTER — Other Ambulatory Visit: Payer: Self-pay | Admitting: Family Medicine

## 2016-03-21 DIAGNOSIS — E1122 Type 2 diabetes mellitus with diabetic chronic kidney disease: Secondary | ICD-10-CM

## 2016-04-20 ENCOUNTER — Other Ambulatory Visit: Payer: Self-pay

## 2016-04-20 DIAGNOSIS — F411 Generalized anxiety disorder: Secondary | ICD-10-CM

## 2016-04-20 MED ORDER — DULOXETINE HCL 20 MG PO CPEP: 20.0000 mg | ORAL_CAPSULE | Freq: Every day | ORAL | 0 refills | Status: DC

## 2016-05-11 ENCOUNTER — Other Ambulatory Visit: Payer: Self-pay | Admitting: *Deleted

## 2016-05-11 DIAGNOSIS — F411 Generalized anxiety disorder: Secondary | ICD-10-CM

## 2016-05-11 MED ORDER — DULOXETINE HCL 20 MG PO CPEP: 20.0000 mg | ORAL_CAPSULE | Freq: Every day | ORAL | 0 refills | Status: DC

## 2016-05-27 ENCOUNTER — Other Ambulatory Visit: Payer: Self-pay

## 2016-05-27 DIAGNOSIS — E1122 Type 2 diabetes mellitus with diabetic chronic kidney disease: Secondary | ICD-10-CM

## 2016-05-27 MED ORDER — DULAGLUTIDE 1.5 MG/0.5ML ~~LOC~~ SOAJ: 1.5000 mg | SUBCUTANEOUS | 0 refills | Status: DC

## 2016-05-27 MED ORDER — INSULIN DEGLUDEC 100 UNIT/ML ~~LOC~~ SOPN: 20.0000 [IU] | PEN_INJECTOR | Freq: Every day | SUBCUTANEOUS | 0 refills | Status: DC

## 2016-05-28 ENCOUNTER — Other Ambulatory Visit: Payer: Self-pay | Admitting: *Deleted

## 2016-05-28 MED ORDER — SIMVASTATIN 10 MG PO TABS: 10.0000 mg | ORAL_TABLET | Freq: Every day | ORAL | 0 refills | Status: DC

## 2016-06-12 ENCOUNTER — Other Ambulatory Visit: Payer: Self-pay | Admitting: *Deleted

## 2016-06-12 DIAGNOSIS — F411 Generalized anxiety disorder: Secondary | ICD-10-CM

## 2016-06-12 MED ORDER — DULOXETINE HCL 20 MG PO CPEP: 20.0000 mg | ORAL_CAPSULE | Freq: Every day | ORAL | 0 refills | Status: DC

## 2016-06-16 ENCOUNTER — Encounter: Payer: Self-pay | Admitting: Physician Assistant

## 2016-06-16 ENCOUNTER — Ambulatory Visit (INDEPENDENT_AMBULATORY_CARE_PROVIDER_SITE_OTHER): Payer: 59 | Admitting: Physician Assistant

## 2016-06-16 VITALS — BP 128/72 | HR 82 | Ht 61.0 in | Wt 250.0 lb

## 2016-06-16 DIAGNOSIS — E1169 Type 2 diabetes mellitus with other specified complication: Secondary | ICD-10-CM | POA: Diagnosis not present

## 2016-06-16 DIAGNOSIS — F411 Generalized anxiety disorder: Secondary | ICD-10-CM

## 2016-06-16 DIAGNOSIS — Z79899 Other long term (current) drug therapy: Secondary | ICD-10-CM

## 2016-06-16 DIAGNOSIS — Z794 Long term (current) use of insulin: Secondary | ICD-10-CM | POA: Diagnosis not present

## 2016-06-16 DIAGNOSIS — E785 Hyperlipidemia, unspecified: Secondary | ICD-10-CM

## 2016-06-16 DIAGNOSIS — E1122 Type 2 diabetes mellitus with diabetic chronic kidney disease: Secondary | ICD-10-CM | POA: Diagnosis not present

## 2016-06-16 DIAGNOSIS — I10 Essential (primary) hypertension: Secondary | ICD-10-CM

## 2016-06-16 DIAGNOSIS — Z13220 Encounter for screening for lipoid disorders: Secondary | ICD-10-CM | POA: Diagnosis not present

## 2016-06-16 LAB — COMPLETE METABOLIC PANEL WITH GFR
ALBUMIN: 4.5 g/dL (ref 3.6–5.1)
ALK PHOS: 61 U/L (ref 33–115)
ALT: 13 U/L (ref 6–29)
AST: 14 U/L (ref 10–35)
BUN: 11 mg/dL (ref 7–25)
CALCIUM: 10.3 mg/dL — AB (ref 8.6–10.2)
CHLORIDE: 105 mmol/L (ref 98–110)
CO2: 23 mmol/L (ref 20–31)
CREATININE: 1.33 mg/dL — AB (ref 0.50–1.10)
GFR, EST AFRICAN AMERICAN: 55 mL/min — AB (ref >=60)
GFR, Est Non African American: 47 mL/min — ABNORMAL LOW (ref >=60)
Glucose, Bld: 126 mg/dL — ABNORMAL HIGH (ref 65–99)
Potassium: 4.8 mmol/L (ref 3.5–5.3)
Sodium: 138 mmol/L (ref 135–146)
Total Bilirubin: 0.3 mg/dL (ref 0.2–1.2)
Total Protein: 7.4 g/dL (ref 6.1–8.1)

## 2016-06-16 LAB — LIPID PANEL
CHOL/HDL RATIO: 3.7 ratio (ref <5.0)
CHOLESTEROL: 198 mg/dL (ref <200)
HDL: 54 mg/dL (ref >50)
LDL Cholesterol: 116 mg/dL — ABNORMAL HIGH
TRIGLYCERIDES: 142 mg/dL (ref <150)
VLDL: 28 mg/dL (ref <30)

## 2016-06-16 LAB — POCT UA - MICROALBUMIN
Creatinine, POC: 300 mg/dL
Microalbumin Ur, POC: 30 mg/L

## 2016-06-16 LAB — POCT GLYCOSYLATED HEMOGLOBIN (HGB A1C): Hemoglobin A1C: 6.7

## 2016-06-16 MED ORDER — DULAGLUTIDE 1.5 MG/0.5ML ~~LOC~~ SOAJ: 1.5000 mg | SUBCUTANEOUS | 0 refills | Status: DC

## 2016-06-16 MED ORDER — DULOXETINE HCL 20 MG PO CPEP: 20.0000 mg | ORAL_CAPSULE | Freq: Every day | ORAL | 1 refills | Status: DC

## 2016-06-16 MED ORDER — LOSARTAN POTASSIUM-HCTZ 50-12.5 MG PO TABS: 1.0000 | ORAL_TABLET | Freq: Every day | ORAL | 3 refills | Status: DC

## 2016-06-16 MED ORDER — SERTRALINE HCL 100 MG PO TABS: 100.0000 mg | ORAL_TABLET | Freq: Every day | ORAL | 1 refills | Status: DC

## 2016-06-16 MED ORDER — FLUTICASONE PROPIONATE 50 MCG/ACT NA SUSP: 2.0000 | Freq: Every day | NASAL | 5 refills | Status: DC

## 2016-06-16 NOTE — Progress Notes (Signed)
Subjective:    Patient ID: Jasmine [name], female    DOB: 04-20-1966, 50 y.o.   MRN: [id number]  HPI  Pt is a 50 yo female who presents to the clinic to establish care.   .. Active Ambulatory Problems    Diagnosis Date Noted  . Depression 02/09/2010  . ALLERGIC RHINITIS 02/09/2010  . Morbid obesity with BMI of 50.0-59.9, adult (HCC) 06/03/2012  . Type 2 diabetes mellitus (HCC) 11/27/2013  . Hyperlipidemia 11/27/2013  . Primary osteoarthritis of both knees 11/27/2013  . Chronic renal insufficiency, stage II (mild) 11/28/2013  . Umbilical hernia without obstruction and without gangrene 02/02/2014  . Ventral hernia, recurrent 03/28/2014  . Essential hypertension 01/09/2015  . Diabetic retinopathy (HCC) 03/12/2016   Resolved Ambulatory Problems    Diagnosis Date Noted  . No Resolved Ambulatory Problems   Past Medical History:  Diagnosis Date  . Anxiety   . Arthritis   . Chronic renal insufficiency   . Depression   . Headache   . Hypercholesteremia   . Hypertension   . Numbness and tingling of right arm and leg   . Seasonal allergies   . Type II diabetes mellitus (HCC)   . Umbilical hernia without mention of obstruction or gangrene   . Wears glasses    .Marland Kitchen. Family History  Problem Relation Age of Onset  . Diabetes Mother   . Diabetes Father   . Cancer Father     Prostate cancer  . Cancer Sister     Cervical cancer  . Diabetes Brother   . Diabetes Sister   . Diabetes Brother    .Marland Kitchen. Social History   Social History  . Marital status: Married    Spouse name: N/A  . Number of children: N/A  . Years of education: N/A   Occupational History  . Not on file.   Social History Main Topics  . Smoking status: Current Some Day Smoker    Packs/day: 0.25    Years: 26.00    Types: Cigarettes  . Smokeless tobacco: Never Used     Comment: down to 1 cigarette a week  . Alcohol use No     Comment: 03/28/2014 "have a drink a few times/year"  . Drug use: No  . Sexual  activity: Not Currently   Other Topics Concern  . Not on file   Social History Narrative  . No narrative on file   DM- Eye doctor 02/2016 diabetic retinopathy. Checking sugars some this am 129 and last night 83. At times she feels a little shaky but eating always helps. No open sores or wounds. Needs refills.   HTN- controlled doing well on medications. No CP, palpitations, headaches, dizziness.   Anxiety is doing well on zoloft.    Review of Systems See HPI.     Objective:   Physical Exam  Constitutional: She is oriented to person, place, and time. She appears well-developed and well-nourished.  HENT:  Head: Normocephalic and atraumatic.  Cardiovascular: Normal rate, regular rhythm and normal heart sounds.   Pulmonary/Chest: Effort normal and breath sounds normal.  Neurological: She is alert and oriented to person, place, and time.  Psychiatric: She has a normal mood and affect. Her behavior is normal.          Assessment & Plan:  Marland Kitchen.Marland Kitchen.Jasmine [name] was seen today for diabetes.  Diagnoses and all orders for this visit:  Type 2 diabetes mellitus with chronic kidney disease, with long-term current use of insulin, unspecified CKD stage (  HCC) -     POCT HgB A1C -     POCT UA - Microalbumin -     Dulaglutide (TRULICITY) 1.5 MG/0.5ML SOPN; Inject 1.5 mg into the skin once a week.  Anxiety state -     sertraline (ZOLOFT) 100 MG tablet; Take 1 tablet (100 mg total) by mouth daily. -     DULoxetine (CYMBALTA) 20 MG capsule; Take 1 capsule (20 mg total) by mouth daily.  Screening for lipid disorders  Hyperlipidemia associated with type 2 diabetes mellitus (HCC) -     Lipid panel  Medication management -     COMPLETE METABOLIC PANEL WITH GFR  Essential hypertension, benign -     losartan-hydrochlorothiazide (HYZAAR) 50-12.5 MG tablet; Take 1 tablet by mouth daily.  Other orders -     fluticasone (FLONASE) 50 MCG/ACT nasal spray; Place 2 sprays into both nostrils  daily.  .. Lab Results  Component Value Date   HGBA1C 6.7 06/16/2016  a1c is 6.7 great.   Pt does not know current insulin. She will call us back with info.  Continue on same dose.  Follow up in 3 months.

## 2016-06-17 DIAGNOSIS — F411 Generalized anxiety disorder: Secondary | ICD-10-CM | POA: Insufficient documentation

## 2016-06-17 DIAGNOSIS — E1169 Type 2 diabetes mellitus with other specified complication: Secondary | ICD-10-CM | POA: Insufficient documentation

## 2016-06-17 DIAGNOSIS — E785 Hyperlipidemia, unspecified: Secondary | ICD-10-CM

## 2016-06-18 ENCOUNTER — Other Ambulatory Visit: Payer: Self-pay | Admitting: *Deleted

## 2016-06-18 MED ORDER — TRAMADOL HCL 50 MG PO TABS: 50.0000 mg | ORAL_TABLET | Freq: Three times a day (TID) | ORAL | 0 refills | Status: DC | PRN

## 2016-06-18 MED ORDER — SIMVASTATIN 10 MG PO TABS: 10.0000 mg | ORAL_TABLET | Freq: Every day | ORAL | 3 refills | Status: DC

## 2016-07-07 ENCOUNTER — Telehealth: Payer: Self-pay | Admitting: Physician Assistant

## 2016-07-07 NOTE — Telephone Encounter (Signed)
Patient will be faxing over work accomodation forms to be filled out today to fax number [phone] just F.Y.I.  Thanks.

## 2016-08-06 ENCOUNTER — Encounter (HOSPITAL_COMMUNITY): Payer: Self-pay | Admitting: Emergency Medicine

## 2016-08-06 ENCOUNTER — Emergency Department (HOSPITAL_COMMUNITY)
Admission: EM | Admit: 2016-08-06 | Discharge: 2016-08-06 | Disposition: A | Discharge status: Home / Self Care | Payer: 59 | Attending: Emergency Medicine | Admitting: Emergency Medicine

## 2016-08-06 DIAGNOSIS — Z5321 Procedure and treatment not carried out due to patient leaving prior to being seen by health care provider: Secondary | ICD-10-CM | POA: Diagnosis not present

## 2016-08-06 DIAGNOSIS — R55 Syncope and collapse: Secondary | ICD-10-CM | POA: Diagnosis not present

## 2016-08-06 LAB — CBC
HEMATOCRIT: 39.4 % (ref 36.0–46.0)
Hemoglobin: 12.6 g/dL (ref 12.0–15.0)
MCH: 23.4 pg — ABNORMAL LOW (ref 26.0–34.0)
MCHC: 32 g/dL (ref 30.0–36.0)
MCV: 73.1 fL — AB (ref 78.0–100.0)
PLATELETS: 206 10*3/uL (ref 150–400)
RBC: 5.39 MIL/uL — AB (ref 3.87–5.11)
RDW: 15.3 % (ref 11.5–15.5)
WBC: 4.2 10*3/uL (ref 4.0–10.5)

## 2016-08-06 LAB — BASIC METABOLIC PANEL
Anion gap: 11 (ref 5–15)
BUN: 12 mg/dL (ref 6–20)
CHLORIDE: 99 mmol/L — AB (ref 101–111)
CO2: 23 mmol/L (ref 22–32)
Calcium: 8.9 mg/dL (ref 8.9–10.3)
Creatinine, Ser: 1.38 mg/dL — ABNORMAL HIGH (ref 0.44–1.00)
GFR, EST AFRICAN AMERICAN: 51 mL/min — AB (ref >60)
GFR, EST NON AFRICAN AMERICAN: 44 mL/min — AB (ref >60)
Glucose, Bld: 169 mg/dL — ABNORMAL HIGH (ref 65–99)
POTASSIUM: 3.8 mmol/L (ref 3.5–5.1)
SODIUM: 133 mmol/L — AB (ref 135–145)

## 2016-08-06 LAB — CBG MONITORING, ED: Glucose-Capillary: 199 mg/dL — ABNORMAL HIGH (ref 65–99)

## 2016-08-06 NOTE — ED Notes (Signed)
Pt states leaving, will be seen somewhere else. Seen leaving ED with steady gait.

## 2016-08-06 NOTE — ED Triage Notes (Signed)
Pt states yesterday afternoon she was using restroom and woke up on the side of the tub with her daughter waking up her. Pt states she also "blacked out" at 2am this morning when she got out of bed. Pt reports she "thought her blood sugar was just dropping". CGB in triage 199. Pt is alert and ox4. Pt having pain on left side of face and neck. Pt denies any blood thinners.

## 2016-08-13 ENCOUNTER — Ambulatory Visit (INDEPENDENT_AMBULATORY_CARE_PROVIDER_SITE_OTHER): Payer: 59

## 2016-08-13 ENCOUNTER — Encounter: Payer: Self-pay | Admitting: Physician Assistant

## 2016-08-13 ENCOUNTER — Ambulatory Visit (INDEPENDENT_AMBULATORY_CARE_PROVIDER_SITE_OTHER): Payer: 59 | Admitting: Physician Assistant

## 2016-08-13 VITALS — BP 135/84 | HR 88 | Temp 97.8°F | Wt 254.0 lb

## 2016-08-13 DIAGNOSIS — J069 Acute upper respiratory infection, unspecified: Secondary | ICD-10-CM

## 2016-08-13 DIAGNOSIS — R55 Syncope and collapse: Secondary | ICD-10-CM | POA: Insufficient documentation

## 2016-08-13 DIAGNOSIS — R0989 Other specified symptoms and signs involving the circulatory and respiratory systems: Secondary | ICD-10-CM

## 2016-08-13 DIAGNOSIS — R05 Cough: Secondary | ICD-10-CM

## 2016-08-13 DIAGNOSIS — R509 Fever, unspecified: Secondary | ICD-10-CM | POA: Diagnosis not present

## 2016-08-13 DIAGNOSIS — K529 Noninfective gastroenteritis and colitis, unspecified: Secondary | ICD-10-CM

## 2016-08-13 DIAGNOSIS — Z720 Tobacco use: Secondary | ICD-10-CM | POA: Insufficient documentation

## 2016-08-13 MED ORDER — PREDNISONE 50 MG PO TABS: ORAL_TABLET | ORAL | 0 refills | Status: DC

## 2016-08-13 MED ORDER — ONDANSETRON 4 MG PO TBDP: 4.0000 mg | ORAL_TABLET | Freq: Four times a day (QID) | ORAL | 0 refills | Status: DC | PRN

## 2016-08-13 MED ORDER — IPRATROPIUM BROMIDE 0.06 % NA SOLN: 1.0000 | Freq: Four times a day (QID) | NASAL | 0 refills | Status: DC | PRN

## 2016-08-13 MED ORDER — GLUCOSE BLOOD VI STRP: ORAL_STRIP | 11 refills | Status: DC

## 2016-08-13 NOTE — Patient Instructions (Addendum)
If diarrhea doesn't improve with Zofran, you can do OTC Imodium Push fluids - small sips of water, gatorade, tea throughout the day Bowel rest - nothing to eat for the next 24 hours or until no diarrhea. Then slowly introduce a bland diet I have reordered your glucose strips. Continue to check your morning sugars and check whenever you feel like you are experiencing lows   Bland Diet Introduction A bland diet consists of foods that do not have a lot of fat or fiber. Foods without fat or fiber are easier for the body to digest. They are also less likely to irritate your mouth, throat, stomach, and other parts of your gastrointestinal tract. A bland diet is sometimes called a BRAT diet. What is my plan? Your health care provider or dietitian may recommend specific changes to your diet to prevent and treat your symptoms, such as:  Eating small meals often.  Cooking food until it is soft enough to chew easily.  Chewing your food well.  Drinking fluids slowly.  Not eating foods that are very spicy, sour, or fatty.  Not eating citrus fruits, such as oranges and grapefruit. What do I need to know about this diet?  Eat a variety of foods from the bland diet food list.  Do not follow a bland diet longer than you have to.  Ask your health care provider whether you should take vitamins. What foods can I eat? Grains  Hot cereals, such as cream of wheat. Bread, crackers, or tortillas made from refined white flour. Rice. Vegetables  Canned or cooked vegetables. Mashed or boiled potatoes. Fruits  Bananas. Applesauce. Other types of cooked or canned fruit with the skin and seeds removed, such as canned peaches or pears. Meats and Other Protein Sources  Scrambled eggs. Creamy peanut butter or other nut butters. Lean, well-cooked meats, such as chicken or fish. Tofu. Soups or broths. Dairy  Low-fat dairy products, such as milk, cottage cheese, or yogurt. Beverages  Water. Herbal tea. Apple  juice. Sweets and Desserts  Pudding. Custard. Fruit gelatin. Ice cream. Fats and Oils  Mild salad dressings. Canola or olive oil. The items listed above may not be a complete list of allowed foods or beverages. Contact your dietitian for more options.  What foods are not recommended? Foods and ingredients that are often not recommended include:  Spicy foods, such as hot sauce or salsa.  Fried foods.  Sour foods, such as pickled or fermented foods.  Raw vegetables or fruits, especially citrus or berries.  Caffeinated drinks.  Alcohol.  Strongly flavored seasonings or condiments. The items listed above may not be a complete list of foods and beverages that are not allowed. Contact your dietitian for more information.  This information is not intended to replace advice given to you by your health care provider. Make sure you discuss any questions you have with your health care provider. Document Released: 11/18/2015 Document Revised: 01/02/2016 Document Reviewed: 08/08/2014  2017 Elsevier   Acute Bronchitis, Adult Acute bronchitis is sudden (acute) swelling of the air tubes (bronchi) in the lungs. Acute bronchitis causes these tubes to fill with mucus, which can make it hard to breathe. It can also cause coughing or wheezing. In adults, acute bronchitis usually goes away within 2 weeks. A cough caused by bronchitis may last up to 3 weeks. Smoking, allergies, and asthma can make the condition worse. Repeated episodes of bronchitis may cause further lung problems, such as chronic obstructive pulmonary disease (COPD). What are the  causes? This condition can be caused by germs and by substances that irritate the lungs, including:  Cold and flu viruses. This condition is most often caused by the same virus that causes a cold.  Bacteria.  Exposure to tobacco smoke, dust, fumes, and air pollution. What increases the risk? This condition is more likely to develop in people who:  Have  close contact with someone with acute bronchitis.  Are exposed to lung irritants, such as tobacco smoke, dust, fumes, and vapors.  Have a weak immune system.  Have a respiratory condition such as asthma. What are the signs or symptoms? Symptoms of this condition include:  A cough.  Coughing up clear, yellow, or green mucus.  Wheezing.  Chest congestion.  Shortness of breath.  A fever.  Body aches.  Chills.  A sore throat. How is this diagnosed? This condition is usually diagnosed with a physical exam. During the exam, your health care provider may order tests, such as chest X-rays, to rule out other conditions. He or she may also:  Test a sample of your mucus for bacterial infection.  Check the level of oxygen in your blood. This is done to check for pneumonia.  Do a chest X-ray or lung function testing to rule out pneumonia and other conditions.  Perform blood tests. Your health care provider will also ask about your symptoms and medical history. How is this treated? Most cases of acute bronchitis clear up over time without treatment. Your health care provider may recommend:  Drinking more fluids. Drinking more makes your mucus thinner, which may make it easier to breathe.  Taking a medicine for a fever or cough.  Taking an antibiotic medicine.  Using an inhaler to help improve shortness of breath and to control a cough.  Using a cool mist vaporizer or humidifier to make it easier to breathe. Follow these instructions at home: Medicines  Take over-the-counter and prescription medicines only as told by your health care provider.  If you were prescribed an antibiotic, take it as told by your health care provider. Do not stop taking the antibiotic even if you start to feel better. General instructions  Get plenty of rest.  Drink enough fluids to keep your urine clear or pale yellow.  Avoid smoking and secondhand smoke. Exposure to cigarette smoke or  irritating chemicals will make bronchitis worse. If you smoke and you need help quitting, ask your health care provider. Quitting smoking will help your lungs heal faster.  Use an inhaler, cool mist vaporizer, or humidifier as told by your health care provider.  Keep all follow-up visits as told by your health care provider. This is important. How is this prevented? To lower your risk of getting this condition again:  Wash your hands often with soap and water. If soap and water are not available, use hand sanitizer.  Avoid contact with people who have cold symptoms.  Try not to touch your hands to your mouth, nose, or eyes.  Make sure to get the flu shot every year. Contact a health care provider if:  Your symptoms do not improve in 2 weeks of treatment. Get help right away if:  You cough up blood.  You have chest pain.  You have severe shortness of breath.  You become dehydrated.  You faint or keep feeling like you are going to faint.  You keep vomiting.  You have a severe headache.  Your fever or chills gets worse. This information is not intended to replace  advice given to you by your health care provider. Make sure you discuss any questions you have with your health care provider. Document Released: 09/03/2004 Document Revised: 02/19/2016 Document Reviewed: 01/15/2016 Elsevier Interactive Patient Education  2017 [employer]   Vasovagal Syncope, Adult Syncope, which is commonly known as fainting or passing out, is a temporary loss of consciousness. It occurs when the blood flow to the brain is reduced. Vasovagal syncope, also called neurocardiogenic syncope, is a fainting spell that happens when blood flow to the brain is reduced because of a sudden drop in heart rate and blood pressure. Vasovagal syncope is usually harmless. However, you can get injured if you fall during a fainting spell. What are the causes? This condition is caused by a drop in heart rate and  blood pressure, usually in response to a trigger. Many things and situations can trigger an episode, including:  Pain.  Fear.  The sight of blood. This may occur during medical procedures, such as when blood is being drawn from a vein.  Common activities, such as coughing, swallowing, stretching, or going to the bathroom.  Emotional stress.  Being in a confined space.  Prolonged standing, especially in a warm environment.  Lack of sleep or rest.  Not eating for a long time.  Not drinking enough liquids.  Recent illness.  Drinking alcohol.  Taking drugs that affect blood pressure, such as marijuana, cocaine, opiates, or inhalants. What are the signs or symptoms? Before a fainting episode, you may:  Feel dizzy or light-headed.  Become pale.  Sense that you are going to faint.  Feel like the room is spinning.  Only see directly ahead (tunnel vision).  Feel sick to your stomach (nauseous).  See spots.  Slowly lose vision.  Hear ringing in your ears.  Have a headache.  Feel warm and sweaty.  Feel a sensation of pins and needles. During the fainting spell, you may twitch or make jerky movements. Fainting spells usually last no longer than a few minutes before you wake up. If you get up too quickly before your body can recover, you may faint again. How is this diagnosed? This condition is diagnosed based on your symptoms, your medical history, and a physical exam. Tests may be done to rule out other causes of fainting. Tests may include:  Blood tests.  Heart tests, such as an electrocardiogram (ECG), echocardiogram, or electrophysiology study.  A test to check your response to changes in position (tilt table test). How is this treated? Usually, treatment is not needed for this condition. Your health care provider may suggest ways to help prevent fainting episodes. These may include:  Drinking additional fluids if you are exposed to a trigger.  Sitting or  lying down if you notice signs that an episode is coming. If your fainting spells continue, your health care provider may recommend that you:  Take medicines to prevent fainting or to help reduce further episodes of fainting.  Do certain exercises.  Wear compression stockings.  Have surgery to place a pacemaker in your body (rare). Follow these instructions at home:  Learn to identify the signs that an episode is coming.  Sit or lie down at the first sign of a fainting spell. If you sit down, put your head down between your legs. If you lie down, swing your legs up in the air to increase blood flow to the brain.  Avoid hot tubs and saunas.  Avoid standing for a long time. If you have to  stand for a long time, try:  Crossing your legs.  Flexing and stretching your leg muscles.  Squatting.  Moving your legs.  Bending over.  Drink enough fluid to keep your urine clear or pale yellow.  Make changes to your diet that your health care provider recommends. You may be told to:  Avoid caffeine.  Eat more salt.  Take over-the-counter and prescription medicines only as told by your health care provider. Contact a health care provider if:  You continue to have fainting spells despite treatment.  You faint more often despite treatment.  You lose consciousness for more than a few minutes.  You faint during or after exercising or after being startled.  You have twitching or jerky movements for longer than a few seconds during a fainting spell.  You have an episode of twitching or jerky movements without fainting. Get help right away if:  A fainting spell leads to an injury or bleeding.  You have new symptoms that occur with the fainting spells, such as:  Shortness of breath.  Chest pain.  Irregular heartbeat.  You twitch or make jerky movements for more than 5 minutes.  You twitch or make jerky movements during more than one fainting spell. This information is not  intended to replace advice given to you by your health care provider. Make sure you discuss any questions you have with your health care provider. Document Released: 07/13/2012 Document Revised: 01/08/2016 Document Reviewed: 05/25/2015 Elsevier Interactive Patient Education  2017 [employer].

## 2016-08-13 NOTE — Progress Notes (Signed)
HPI:                                                                Jasmine [name] is a 51 y.o. female who presents to [facility] today for diarrhea and abdominal pain  Patient with DMII, HTN, and hx of hernias presents with left lower abdominal pain x 3 days. Endorses diarrhea that began yesterday. Diarrhea occurs approximately once an hour. Endorses nausea without vomiting. She is tolerating PO fluids. Patient states that multiple family members are sick with similar symptoms. History is significant for abdominal adhesions, and multiple ventral and umbilical hernia repairs. No history of diverticulosis.  Additionally, patient had two episodes of syncope while urinating last week. Patient states she became sweaty and lost consciousness. She did not hit her head. The second episode was witnessed by her husband and they went to the Ed. She was seen in the ED on 08/06/16 and had an ECG and labs. She left AMA due to the long wait times.  Health Maintenance Health Maintenance  Topic Date Due  . HIV Screening  08/30/1982  . TETANUS/TDAP  08/30/1986  . PAP SMEAR  06/07/2016  . INFLUENZA VACCINE  06/16/2017 (Originally 03/10/2016)  . HEMOGLOBIN A1C  12/14/2016  . OPHTHALMOLOGY EXAM  03/03/2017  . FOOT EXAM  06/16/2017  . PNEUMOCOCCAL POLYSACCHARIDE VACCINE (2) 03/30/2019    Past Medical History:  Diagnosis Date  . Anxiety   . Arthritis    "knees" (03/28/2014)  . Chronic renal insufficiency    stage II (mild)  . Depression   . Headache    with high blood sugar  . Hypercholesteremia   . Hypertension   . Numbness and tingling of right arm and leg   . Seasonal allergies   . Type II diabetes mellitus (HCC)   . Umbilical hernia without mention of obstruction or gangrene   . Wears glasses    Past Surgical History:  Procedure Laterality Date  . HERNIA REPAIR    . INSERTION OF MESH N/A 03/28/2014   Procedure: INSERTION OF MESH;  Surgeon:  Axel Filler, MD;  Location[facility] OR;  Service: General;  Laterality: N/A;  . INSERTION OF MESH N/A 05/08/2015   Procedure: INSERTION OF MESH;  Surgeon: Axel Filler, MD;  Location: [facility] OR;  Service: General;  Laterality: N/A;  . LAPAROSCOPIC LYSIS OF ADHESIONS N/A 05/08/2015   Procedure: LAPAROSCOPIC LYSIS OF ADHESIONS;  Surgeon: Axel Filler, MD;  Location: [facility] OR;  Service: General;  Laterality: N/A;  . MULTIPLE TOOTH EXTRACTIONS  ~ 2003  . TONSILLECTOMY    . UMBILICAL HERNIA REPAIR  1973  . VENTRAL HERNIA REPAIR  03/27/2014  . VENTRAL HERNIA REPAIR N/A 03/28/2014   Procedure: LAPAROSCOPIC VENTRAL HERNIA;  Surgeon: Axel Filler, MD;  Location: [facility] OR;  Service: General;  Laterality: N/A;  . VENTRAL HERNIA REPAIR N/A 05/08/2015   Procedure: LAPAROSCOPIC VENTRAL HERNIA REPAIR WITH MESH;  Surgeon: Axel Filler, MD;  Location: [facility] OR;  Service: General;  Laterality: N/A;   Social History  Substance Use Topics  . Smoking status: Current Some Day Smoker    Packs/day: 0.25    Years: 26.00    Types: Cigarettes  . Smokeless tobacco: Never Used  Comment: down to 1 cigarette a week  . Alcohol use No     Comment: 03/28/2014 "have a drink a few times/year"   family history includes Cancer in her father and sister; Diabetes in her brother, brother, father, mother, and sister.  Review of Systems  Constitutional: Positive for chills and malaise/fatigue. Negative for weight loss.  HENT: Positive for congestion, nosebleeds and sore throat (right-sided). Negative for ear pain.   Respiratory: Positive for cough, sputum production and shortness of breath. Negative for hemoptysis.   Cardiovascular: Negative for chest pain and palpitations.  Gastrointestinal: Positive for abdominal pain, diarrhea and nausea. Negative for blood in stool and vomiting.  Musculoskeletal: Positive for myalgias.  Skin: Negative for rash.  Neurological: Positive for loss of consciousness. Negative for dizziness, focal  weakness, seizures and headaches.     Medications: Current Outpatient Prescriptions  Medication Sig Dispense Refill  . cetirizine (ZYRTEC) 10 MG tablet Take 10 mg by mouth daily as needed for allergies.    . Dulaglutide (TRULICITY) 1.5 MG/0.5ML SOPN Inject 1.5 mg into the skin once a week. 12 pen 0  . DULoxetine (CYMBALTA) 20 MG capsule Take 1 capsule (20 mg total) by mouth daily. 90 capsule 1  . fluticasone (FLONASE) 50 MCG/ACT nasal spray Place 2 sprays into both nostrils daily. 16 g 5  . glucose blood test strip One Touch Ultra test strips. Check blood sugar 3 times daily. Diagnosis - Diabetes ICD 10 - E 11.22 100 each 11  . losartan-hydrochlorothiazide (HYZAAR) 50-12.5 MG tablet Take 1 tablet by mouth daily. 90 tablet 3  . meloxicam (MOBIC) 15 MG tablet One tab PO qAM with breakfast for 2 weeks, then daily prn pain. (Patient taking differently: Take 15 mg by mouth daily as needed for pain. ) 30 tablet 3  . oxyCODONE-acetaminophen (PERCOCET) 7.5-325 MG tablet Take 1 tablet by mouth every 4 (four) hours as needed for severe pain. 30 tablet 0  . sertraline (ZOLOFT) 100 MG tablet Take 1 tablet (100 mg total) by mouth daily. 90 tablet 1  . simvastatin (ZOCOR) 10 MG tablet Take 1 tablet (10 mg total) by mouth daily. 90 tablet 3  . traMADol (ULTRAM) 50 MG tablet Take 1 tablet (50 mg total) by mouth every 8 (eight) hours as needed. 30 tablet 0  . ipratropium (ATROVENT) 0.06 % nasal spray Place 1 spray into both nostrils 4 (four) times daily as needed for rhinitis. 15 mL 0  . ondansetron (ZOFRAN-ODT) 4 MG disintegrating tablet Take 1 tablet (4 mg total) by mouth every 6 (six) hours as needed for nausea or vomiting. 20 tablet 0  . predniSONE (DELTASONE) 50 MG tablet One tab PO daily for 5 days. 5 tablet 0   Current Facility-Administered Medications  Medication Dose Route Frequency Provider Last Rate Last Dose  . levonorgestrel (MIRENA) 20 MCG/24HR IUD   Intrauterine Once Ok Edwards, MD        Allergies  Allergen Reactions  . Eggs Or Egg-Derived Products Hives and Other (See Comments)    FLU VACCINE: caused hives    . Influenza Vaccines Hives    Vaccine from Egg derived product causes Hives  . Effexor [Venlafaxine]     "felt weird"  . Metformin And Related Hives      Objective:  BP 135/84   Pulse 88   Temp 97.8 F (36.6 C) (Oral)   Wt 254 lb (115.2 kg)   LMP 01/20/2015   BMI 46.46 kg/m  Gen: well-groomed, cooperative, ill-appearing, not  toxic appearing, no distress HEENT: nasal mucosa edema and rhinorrhea, TM's clear, oropharynx clear, dry mucus membranes, tender right-sided tonsillar adenopathy, no cervical adenopathy Lungs: Normal work of breathing, clear to auscultation bilaterally Heart: Normal rate, regular rhythm, s1 and s2 distinct, no murmurs, clicks or rubs appreciated on this exam Abd: Soft. Obese. Nondistended, Hypoactive bowel sounds. Tender to deep palpation in the left lower quadrant. No rebound. No guarding.  Skin: warm and dry, no rashes or lesions on exposed skin  I personally reviewed the CT Abd/Pel from 03/20/15, which noted a ventral hernia, normal appendix, and normal gallbladder.  I personally reviewed the ECG from 08/07/16, which showed normal sinus rhythm, rate 94, normal Pri, normal QTc  Assessment and Plan: 51 y.o. female with   1. Acute upper respiratory infection - DG Chest 2 View - ipratropium (ATROVENT) 0.06 % nasal spray; Place 1 spray into both nostrils 4 (four) times daily as needed for rhinitis.  Dispense: 15 mL; Refill: 0 - predniSONE (DELTASONE) 50 MG tablet; One tab PO daily for 5 days.  Dispense: 5 tablet; Refill: 0  2. Gastroenteritis, acute - patient without acute abdomen and known sick contacts. Imaging not indicated at this time. Will treat conservatively for viral enteritis. - ondansetron (ZOFRAN-ODT) 4 MG disintegrating tablet; Take 1 tablet (4 mg total) by mouth every 6 (six) hours as needed for nausea  If diarrhea  doesn't improve with Zofran, recommended OTC Imodium Push fluids - small sips of water, gatorade, tea throughout the day Bowel rest - nothing to eat for the next 24 hours or until no diarrhea. Then slowly introduce a bland diet  3. Micturition syncope - patient with normal ECG and symptoms consistent with vasovagal syncope with known trigger - reassurance provided  Patient education and anticipatory guidance given Patient agrees with treatment plan Follow-up as needed if symptoms worsen or fail to improve  Levonne Hubertharley E. Cummings PA-C

## 2016-08-14 ENCOUNTER — Other Ambulatory Visit: Payer: Self-pay

## 2016-08-14 ENCOUNTER — Telehealth: Payer: Self-pay | Admitting: Physician Assistant

## 2016-08-14 MED ORDER — GLUCOSE BLOOD VI STRP: ORAL_STRIP | 11 refills | Status: DC

## 2016-08-14 NOTE — Telephone Encounter (Signed)
Patient called adv that [employer] associated with [employer] will be sending over forms and they will need the office notes from 08/13/16 to be faxed over with the forms. Thanks

## 2016-08-14 NOTE — Telephone Encounter (Signed)
Received. I just gave the forms to BevierKelsey. Thanks!

## 2016-08-19 ENCOUNTER — Encounter: Payer: Self-pay | Admitting: Physician Assistant

## 2016-08-19 ENCOUNTER — Ambulatory Visit (INDEPENDENT_AMBULATORY_CARE_PROVIDER_SITE_OTHER): Payer: 59 | Admitting: Physician Assistant

## 2016-08-19 ENCOUNTER — Ambulatory Visit (INDEPENDENT_AMBULATORY_CARE_PROVIDER_SITE_OTHER): Payer: 59

## 2016-08-19 ENCOUNTER — Telehealth: Payer: Self-pay | Admitting: Physician Assistant

## 2016-08-19 ENCOUNTER — Other Ambulatory Visit: Payer: Self-pay | Admitting: Physician Assistant

## 2016-08-19 VITALS — BP 140/84 | HR 103 | Temp 98.2°F | Ht 62.0 in | Wt 257.0 lb

## 2016-08-19 DIAGNOSIS — R1031 Right lower quadrant pain: Secondary | ICD-10-CM

## 2016-08-19 DIAGNOSIS — A048 Other specified bacterial intestinal infections: Secondary | ICD-10-CM

## 2016-08-19 DIAGNOSIS — R1011 Right upper quadrant pain: Secondary | ICD-10-CM | POA: Diagnosis not present

## 2016-08-19 DIAGNOSIS — R1115 Cyclical vomiting syndrome unrelated to migraine: Secondary | ICD-10-CM

## 2016-08-19 DIAGNOSIS — E860 Dehydration: Secondary | ICD-10-CM

## 2016-08-19 DIAGNOSIS — R197 Diarrhea, unspecified: Secondary | ICD-10-CM | POA: Diagnosis not present

## 2016-08-19 DIAGNOSIS — G43A0 Cyclical vomiting, not intractable: Secondary | ICD-10-CM | POA: Diagnosis not present

## 2016-08-19 DIAGNOSIS — R112 Nausea with vomiting, unspecified: Secondary | ICD-10-CM | POA: Diagnosis not present

## 2016-08-19 DIAGNOSIS — R1012 Left upper quadrant pain: Secondary | ICD-10-CM

## 2016-08-19 DIAGNOSIS — A09 Infectious gastroenteritis and colitis, unspecified: Secondary | ICD-10-CM

## 2016-08-19 DIAGNOSIS — J0100 Acute maxillary sinusitis, unspecified: Secondary | ICD-10-CM

## 2016-08-19 LAB — CBC WITH DIFFERENTIAL/PLATELET
BASOS ABS: 60 {cells}/uL (ref 0–200)
Basophils Relative: 1 %
EOS PCT: 2 %
Eosinophils Absolute: 120 cells/uL (ref 15–500)
HEMATOCRIT: 39.6 % (ref 35.0–45.0)
Hemoglobin: 12.8 g/dL (ref 11.7–15.5)
LYMPHS ABS: 3480 {cells}/uL (ref 850–3900)
LYMPHS PCT: 58 %
MCH: 23.4 pg — AB (ref 27.0–33.0)
MCHC: 32.3 g/dL (ref 32.0–36.0)
MCV: 72.3 fL — ABNORMAL LOW (ref 80.0–100.0)
MPV: 9.6 fL (ref 7.5–12.5)
Monocytes Absolute: 300 cells/uL (ref 200–950)
Monocytes Relative: 5 %
NEUTROS PCT: 34 %
Neutro Abs: 2040 cells/uL (ref 1500–7800)
Platelets: 473 10*3/uL — ABNORMAL HIGH (ref 140–400)
RBC: 5.48 MIL/uL — ABNORMAL HIGH (ref 3.80–5.10)
RDW: 16.2 % — AB (ref 11.0–15.0)
WBC: 6 10*3/uL (ref 3.8–10.8)

## 2016-08-19 LAB — COMPLETE METABOLIC PANEL WITH GFR
ALK PHOS: 67 U/L (ref 33–115)
ALT: 13 U/L (ref 6–29)
AST: 12 U/L (ref 10–35)
Albumin: 4.3 g/dL (ref 3.6–5.1)
BUN: 11 mg/dL (ref 7–25)
CALCIUM: 9.9 mg/dL (ref 8.6–10.2)
CO2: 24 mmol/L (ref 20–31)
Chloride: 106 mmol/L (ref 98–110)
Creat: 1.16 mg/dL — ABNORMAL HIGH (ref 0.50–1.10)
GFR, EST NON AFRICAN AMERICAN: 56 mL/min — AB (ref >=60)
GFR, Est African American: 64 mL/min (ref >=60)
Glucose, Bld: 151 mg/dL — ABNORMAL HIGH (ref 65–99)
POTASSIUM: 4.8 mmol/L (ref 3.5–5.3)
Sodium: 137 mmol/L (ref 135–146)
Total Bilirubin: 0.3 mg/dL (ref 0.2–1.2)
Total Protein: 7.4 g/dL (ref 6.1–8.1)

## 2016-08-19 MED ORDER — OMEPRAZOLE 40 MG PO CPDR: 40.0000 mg | DELAYED_RELEASE_CAPSULE | Freq: Every day | ORAL | 3 refills | Status: DC

## 2016-08-19 MED ORDER — SUCRALFATE 1 G PO TABS: 1.0000 g | ORAL_TABLET | Freq: Three times a day (TID) | ORAL | 1 refills | Status: DC

## 2016-08-19 MED ORDER — AMOXICILLIN-POT CLAVULANATE 875-125 MG PO TABS: 1.0000 | ORAL_TABLET | Freq: Two times a day (BID) | ORAL | 0 refills | Status: DC

## 2016-08-19 NOTE — Telephone Encounter (Signed)
Completed paperwork. Please let pt know and fax over to company as well as scan copy into EMR.

## 2016-08-19 NOTE — Patient Instructions (Signed)

## 2016-08-19 NOTE — Telephone Encounter (Signed)
Patient was seen on 08/13/16 by Vinetta Bergamoharley and called back for fax number to have job send over disability forms to be completed gave [employer] fax number for forms to be sent to [phone]. Patient was seen 08/19/16 still not better from last week (flu like symptoms) and asked about forms job faxed and was given cover sheet from [employer] Disability Information systems manager([employer]) with notes from our office but not 29 pages of disability paper work for her pcp to complete. Pt emailed me the disability paperwork that needed to be completed that job should of faxed 08/14/16 and I placed pages 10-18 in [employer] in box to have completed. Paperwork states to have completed in 5 business days from when it was faxed to their company but it was never faxed on 08/14/16. Advised patient for paperwork please allow 5-7 days to be completed and she stated she needs that filled out ASAP and before Jan19th in order to be paid on Aug 28, 2016. Thanks  Disability Paperwork Info: Attn: [employer] Claim Number:[id number] Fax Number: [phone]

## 2016-08-19 NOTE — Progress Notes (Signed)
Reviewed with patient in office. Likely gastritis. Treated with GI cocktail and PPI and carafate.

## 2016-08-19 NOTE — Progress Notes (Signed)
Subjective:    Patient ID: Stormy CardYolanda E Cohick, female    DOB: Aug 17, 1965, 51 y.o.   MRN: [id number]  HPI  Pt is a 51 yo morbidly obese female who presents to the clinic ongoing diarrhea, nausea and vomiting, and abdominal pain.   She was seen in clinic on 08/13/16 and dx with URI and gastroenteritis. She was given prednisone for 5 days. She has continue to worsen. She has a productive cough, sinus pressure, ear pain along with 4-5 loose to watery stools a day. She is barely eating due to vomiting and nausea. At times she feels like "her stomach is burning". She denies any melena or hematochezia. She denies any fever or body aches. She has not had any dysuria or change in urination. She has not been able to work due to symptoms.    Review of Systems See HPI.     Objective:   Physical Exam  Constitutional: She appears well-developed and well-nourished.  morbidly obese.   HENT:  Head: Normocephalic and atraumatic.  Right Ear: External ear normal.  Left Ear: External ear normal.  Nose: Nose normal.  Mouth/Throat: Oropharynx is clear and moist. No oropharyngeal exudate.  TM's clear. positive for tenderness over maxillary sinuses.  Very dry mucus membranes.    Eyes: Conjunctivae are normal. Right eye exhibits no discharge. Left eye exhibits no discharge.  Neck: Normal range of motion. Neck supple.  Cardiovascular: Regular rhythm and normal heart sounds.   Tachycardia at 103.   Pulmonary/Chest: Effort normal.  Scattered rhonchi over base of right lungs.  No wheezing.   Negative for CVA tenderness.   Abdominal:  Rebound tenderness over RUQ.  Moderate diffuse tenderness over RLQ.  No guarding.   No tenderness over Left quadrants.    Lymphadenopathy:    She has cervical adenopathy.          Assessment & Plan:  Marland Kitchen.Marland Kitchen.[name] was seen today for cough, diarrhea and abdominal pain.  Diagnoses and all orders for this visit:  Abdominal pain, right upper quadrant -     CBC with  Differential/Platelet -     COMPLETE METABOLIC PANEL WITH GFR -     Lipase -     Cancel: CT ABDOMEN PELVIS W CONTRAST; Future -     H. pylori breath test -     omeprazole (PRILOSEC) 40 MG capsule; Take 1 capsule (40 mg total) by mouth daily. -     sucralfate (CARAFATE) 1 g tablet; Take 1 tablet (1 g total) by mouth 4 (four) times daily -  with meals and at bedtime.  Right lower quadrant abdominal pain -     CBC with Differential/Platelet -     COMPLETE METABOLIC PANEL WITH GFR -     Lipase -     Cancel: CT ABDOMEN PELVIS W CONTRAST; Future -     omeprazole (PRILOSEC) 40 MG capsule; Take 1 capsule (40 mg total) by mouth daily. -     sucralfate (CARAFATE) 1 g tablet; Take 1 tablet (1 g total) by mouth 4 (four) times daily -  with meals and at bedtime.  Non-intractable cyclical vomiting with nausea -     CBC with Differential/Platelet -     COMPLETE METABOLIC PANEL WITH GFR -     Lipase -     Cancel: CT ABDOMEN PELVIS W CONTRAST; Future -     omeprazole (PRILOSEC) 40 MG capsule; Take 1 capsule (40 mg total) by mouth daily. -     sucralfate (  CARAFATE) 1 g tablet; Take 1 tablet (1 g total) by mouth 4 (four) times daily -  with meals and at bedtime.  Diarrhea of presumed infectious origin -     CBC with Differential/Platelet -     COMPLETE METABOLIC PANEL WITH GFR -     Lipase -     Cancel: CT ABDOMEN PELVIS W CONTRAST; Future -     omeprazole (PRILOSEC) 40 MG capsule; Take 1 capsule (40 mg total) by mouth daily. -     sucralfate (CARAFATE) 1 g tablet; Take 1 tablet (1 g total) by mouth 4 (four) times daily -  with meals and at bedtime.  Dehydration  Acute non-recurrent maxillary sinusitis -     amoxicillin-clavulanate (AUGMENTIN) 875-125 MG tablet; Take 1 tablet by mouth 2 (two) times daily. For 10 days.   STAT CT done that was negative for any cause of pain. Cbc and cmp still pending.  IV fluids were attempted but not able to gain access due to body habitus.  Breath test done in  office to rule out h.pylori. GI cocktail given in office and started on omeprazole and carafate.  GERD diet. Continue to push fluids.  Follow up in 2 weeks.     FMLA papers were filled out for work to go back on Monday.

## 2016-08-19 NOTE — Progress Notes (Signed)
Call pt: WBC looks great. Not indicative of infection. Pending lipase and bmp.

## 2016-08-20 LAB — LIPASE: LIPASE: 85 U/L — AB (ref 7–60)

## 2016-08-20 LAB — H. PYLORI BREATH TEST: H. PYLORI BREATH TEST: DETECTED — AB

## 2016-08-20 MED ORDER — AMOXICILLIN 500 MG PO TABS: ORAL_TABLET | ORAL | 0 refills | Status: DC

## 2016-08-20 MED ORDER — CLARITHROMYCIN 500 MG PO TABS: 500.0000 mg | ORAL_TABLET | Freq: Two times a day (BID) | ORAL | 0 refills | Status: DC

## 2016-08-20 NOTE — Addendum Note (Signed)
Addended by: Jomarie LongsBREEBACK, JADE L on: 08/20/2016 03:48 PM   Modules accepted: Orders

## 2016-08-24 ENCOUNTER — Other Ambulatory Visit: Payer: Self-pay | Admitting: *Deleted

## 2016-08-24 MED ORDER — GLUCOSE BLOOD VI STRP: ORAL_STRIP | 11 refills | Status: DC

## 2016-09-02 ENCOUNTER — Ambulatory Visit: Payer: 59 | Admitting: Physician Assistant

## 2016-09-08 ENCOUNTER — Other Ambulatory Visit: Payer: Self-pay | Admitting: Physician Assistant

## 2016-09-08 DIAGNOSIS — J069 Acute upper respiratory infection, unspecified: Secondary | ICD-10-CM

## 2016-09-09 ENCOUNTER — Other Ambulatory Visit: Payer: Self-pay | Admitting: Physician Assistant

## 2016-09-09 DIAGNOSIS — E1122 Type 2 diabetes mellitus with diabetic chronic kidney disease: Secondary | ICD-10-CM

## 2016-09-09 DIAGNOSIS — Z794 Long term (current) use of insulin: Principal | ICD-10-CM

## 2016-09-11 ENCOUNTER — Other Ambulatory Visit: Payer: Self-pay | Admitting: *Deleted

## 2016-09-11 DIAGNOSIS — E1122 Type 2 diabetes mellitus with diabetic chronic kidney disease: Secondary | ICD-10-CM

## 2016-09-11 DIAGNOSIS — Z794 Long term (current) use of insulin: Principal | ICD-10-CM

## 2016-09-11 MED ORDER — DULAGLUTIDE 1.5 MG/0.5ML ~~LOC~~ SOAJ: 1.5000 mg | SUBCUTANEOUS | 0 refills | Status: DC

## 2016-10-01 ENCOUNTER — Other Ambulatory Visit: Payer: Self-pay

## 2016-10-01 DIAGNOSIS — J069 Acute upper respiratory infection, unspecified: Secondary | ICD-10-CM

## 2016-10-01 MED ORDER — IPRATROPIUM BROMIDE 0.06 % NA SOLN: 1.0000 | Freq: Four times a day (QID) | NASAL | 2 refills | Status: DC | PRN

## 2016-10-20 ENCOUNTER — Ambulatory Visit (INDEPENDENT_AMBULATORY_CARE_PROVIDER_SITE_OTHER): Payer: 59 | Admitting: Physician Assistant

## 2016-10-20 ENCOUNTER — Encounter: Payer: Self-pay | Admitting: Physician Assistant

## 2016-10-20 VITALS — BP 133/77 | HR 92 | Ht 62.0 in | Wt 253.0 lb

## 2016-10-20 DIAGNOSIS — Z23 Encounter for immunization: Secondary | ICD-10-CM | POA: Diagnosis not present

## 2016-10-20 DIAGNOSIS — Z794 Long term (current) use of insulin: Secondary | ICD-10-CM | POA: Diagnosis not present

## 2016-10-20 DIAGNOSIS — E1122 Type 2 diabetes mellitus with diabetic chronic kidney disease: Secondary | ICD-10-CM

## 2016-10-20 DIAGNOSIS — F330 Major depressive disorder, recurrent, mild: Secondary | ICD-10-CM

## 2016-10-20 DIAGNOSIS — F411 Generalized anxiety disorder: Secondary | ICD-10-CM | POA: Diagnosis not present

## 2016-10-20 LAB — POCT GLYCOSYLATED HEMOGLOBIN (HGB A1C): Hemoglobin A1C: 11.4

## 2016-10-20 MED ORDER — SERTRALINE HCL 100 MG PO TABS: ORAL_TABLET | ORAL | 0 refills | Status: DC

## 2016-10-20 MED ORDER — DAPAGLIFLOZIN PROPANEDIOL 10 MG PO TABS
10.0000 mg | ORAL_TABLET | Freq: Every day | ORAL | 2 refills | Status: DC
Start: 2016-10-20 — End: 2016-12-21

## 2016-10-20 MED ORDER — INSULIN DEGLUDEC 100 UNIT/ML ~~LOC~~ SOPN: 20.0000 [IU] | PEN_INJECTOR | Freq: Every day | SUBCUTANEOUS | 2 refills | Status: DC

## 2016-10-20 MED ORDER — DULAGLUTIDE 1.5 MG/0.5ML ~~LOC~~ SOAJ: 1.5000 mg | SUBCUTANEOUS | 0 refills | Status: DC

## 2016-10-20 NOTE — Progress Notes (Signed)
   Subjective:    Patient ID: Jasmine [name], female    DOB: 31-Mar-1966, 51 y.o.   MRN: [id number]  HPI  Pt is a 51 yo morbidly obese female who presents to the clinic to follow up on DM. She has not been feeling well recently. She admits to having vision blurriness, fatigue, tearfullness, increased urination, increased thirst. She is only taking trulicity once a week. Last a1c was 6.7. She has been checking her sugars at home and ranging [phone]. 3 years ago she was on insulin but was able to come off of it with better glucose readings. She admits she is stress eating. She loves her desserts.   Her mood is much more down. She admits work is stressful. Her bipolar brother lives with her and causes stress at home. She denies any suicidal or homicidal thoughts.     Review of Systems  All other systems reviewed and are negative.      Objective:   Physical Exam  Constitutional: She is oriented to person, place, and time. She appears well-developed and well-nourished.  Morbidly obese.   HENT:  Head: Normocephalic and atraumatic.  Cardiovascular: Normal rate, regular rhythm and normal heart sounds.   Pulmonary/Chest: Effort normal and breath sounds normal. She has no wheezes.  Neurological: She is alert and oriented to person, place, and time.  Psychiatric: She has a normal mood and affect. Her behavior is normal.          Assessment & Plan:  Marland Kitchen.Marland Kitchen.Jasmine [name] was seen today for diabetes.  Diagnoses and all orders for this visit:  Type 2 diabetes mellitus with chronic kidney disease, with long-term current use of insulin, unspecified CKD stage (HCC) -     POCT HgB A1C -     insulin degludec (TRESIBA) 100 UNIT/ML SOPN FlexTouch Pen; Inject 0.2 mLs (20 Units total) into the skin daily at 10 pm. Increase by 2 units every 3 days until fasting sugars below 120. -     Dulaglutide (TRULICITY) 1.5 MG/0.5ML SOPN; Inject 1.5 mg into the skin once a week. -     dapagliflozin propanediol  (FARXIGA) 10 MG TABS tablet; Take 10 mg by mouth daily.  Need for Tdap vaccination -     Tdap vaccine greater than or equal to 7yo IM  Anxiety state -     sertraline (ZOLOFT) 100 MG tablet; Take one and one/half tablet daily.  Mild episode of recurrent major depressive disorder (HCC) -     sertraline (ZOLOFT) 100 MG tablet; Take one and one/half tablet daily.     .. [facility]2016  Decreased Interest 0  Down, Depressed, Hopeless 2  PHQ - 2 Score 2  Altered sleeping 2  Tired, decreased energy 3  Change in appetite 1  Feeling bad or failure about yourself  1  Trouble concentrating 1  Moving slowly or fidgety/restless 0  Suicidal thoughts 0  PHQ-9 Score 10  increased zoloft.   .. Lab Results  Component Value Date   HGBA1C 11.4 10/20/2016   Increased significantly.  Added tresbia and faraxgia. Discussed side effects and how to taper up on insulin.   Spent 30 minutes with patient and greater than 50 percent of visit spent counseling patient regarding diabetes and management.  Follow up in 3 months.

## 2016-10-26 ENCOUNTER — Telehealth: Payer: Self-pay

## 2016-10-26 ENCOUNTER — Telehealth: Payer: Self-pay | Admitting: Physician Assistant

## 2016-10-26 NOTE — Telephone Encounter (Signed)
Ok for needles for tresiba and diflucan 150mg  once #1 NRF.

## 2016-10-26 NOTE — Telephone Encounter (Signed)
Pt advised. She will get paperwork from HR and bring to office.

## 2016-10-26 NOTE — Telephone Encounter (Signed)
Pt is requesting medication for a yeast infection, and she needs a script for pen needles for her [place].  Please advise.

## 2016-10-26 NOTE — Telephone Encounter (Signed)
Pt reports she is unable to work due to her "high blood sugars."  Pt reports she is urinating so much that she is unable to make it to the bathroom in time. Pt is requesting to be out of work until her blood sugars are adjusted. She was started on a new Rx at last OV. States she has been checking her sugars at home, she is running in the 280-390 range. Pt reports she has not had anything less than 250.   If PCP is ok with this- she is going to contact her HR dept and will send over short term disability range. Routing for review.

## 2016-10-26 NOTE — Telephone Encounter (Signed)
I can write her out for 6 weeks while we diligently work on blood sugars.

## 2016-10-26 NOTE — Telephone Encounter (Signed)
Called order for needles and diflucan into pharmacy.

## 2016-11-02 DIAGNOSIS — Z0189 Encounter for other specified special examinations: Secondary | ICD-10-CM

## 2016-11-04 ENCOUNTER — Telehealth: Payer: Self-pay | Admitting: Emergency Medicine

## 2016-11-04 NOTE — Telephone Encounter (Signed)
Made appt

## 2016-11-04 NOTE — Telephone Encounter (Signed)
Call from [employer] disability services requesting additional information on why patient cannot return to work. This needs to be added to her paperwork. Also, they would like to have a practitioner talk with Jasmine [name] about patient and would appreciate knowing a convenient time: call  [phone] reference #[id number].

## 2016-11-09 NOTE — Telephone Encounter (Signed)
Spoke with disability peer to peer today so that patient could be evaluated for 6 weeks written out of work to work on diabetes and get sugars down.

## 2016-11-26 ENCOUNTER — Ambulatory Visit (INDEPENDENT_AMBULATORY_CARE_PROVIDER_SITE_OTHER): Payer: 59 | Admitting: Physician Assistant

## 2016-11-26 VITALS — BP 112/76 | HR 89 | Ht 62.0 in | Wt 250.0 lb

## 2016-11-26 DIAGNOSIS — E1122 Type 2 diabetes mellitus with diabetic chronic kidney disease: Secondary | ICD-10-CM

## 2016-11-26 DIAGNOSIS — R5383 Other fatigue: Secondary | ICD-10-CM | POA: Diagnosis not present

## 2016-11-26 DIAGNOSIS — B379 Candidiasis, unspecified: Secondary | ICD-10-CM | POA: Diagnosis not present

## 2016-11-26 DIAGNOSIS — Z794 Long term (current) use of insulin: Secondary | ICD-10-CM | POA: Diagnosis not present

## 2016-11-26 LAB — GLUCOSE, POCT (MANUAL RESULT ENTRY): POC Glucose: 170 mg/dl — AB (ref 70–99)

## 2016-11-26 MED ORDER — FLUCONAZOLE 150 MG PO TABS: 150.0000 mg | ORAL_TABLET | Freq: Once | ORAL | 0 refills | Status: AC

## 2016-11-26 MED ORDER — SITAGLIPTIN PHOSPHATE 100 MG PO TABS: 100.0000 mg | ORAL_TABLET | Freq: Every day | ORAL | 2 refills | Status: DC

## 2016-11-26 NOTE — Progress Notes (Addendum)
   Subjective:    Patient ID: Jasmine [name], female    DOB: Jan 25, 1966, 51 y.o.   MRN: [id number]  HPI  Pt is a 51 yo female who presents to the clinic to follow up on uncontrolled DM. In November her a1c was 6.7 and 6 weeks ago checked and 11.4. Pt was very symptomatic and was written out of work until 4/23. She is currently on tresbia, faraxgia, trulicity. She is only up to 18 units at bedtime with tresbia. She is checking her fasting sugars and ranging around 190 to 200's. She feels better but continues to have a lot of weakness, irritability, vision blurriness, and headaches. She states she is trying to eat better but not always successful. She is not exercising. No open wounds or sores. No hypoglycemia.. She is having some vaginal itching and white discharge today.    Review of Systems See HPI.     Objective:   Physical Exam  Constitutional: She is oriented to person, place, and time. She appears well-developed and well-nourished.  Obese.   HENT:  Head: Normocephalic and atraumatic.  Cardiovascular: Normal rate, regular rhythm and normal heart sounds.   Pulmonary/Chest: Effort normal and breath sounds normal.  Neurological: She is alert and oriented to person, place, and time.  Psychiatric: She has a normal mood and affect. Her behavior is normal.          Assessment & Plan:   Marland KitchenMarland KitchenDiagnoses and all orders for this visit:  Type 2 diabetes mellitus with chronic kidney disease, with long-term current use of insulin, unspecified CKD stage (HCC) -     POCT Glucose (CBG) -     sitaGLIPtin (JANUVIA) 100 MG tablet; Take 1 tablet (100 mg total) by mouth daily.  Other fatigue -     CBC -     Comprehensive metabolic panel -     C-reactive protein -     Ferritin -     Sedimentation rate -     TSH -     Vitamin B12 -     Vit D  25 hydroxy (rtn osteoporosis monitoring)  Yeast infection -     fluconazole (DIFLUCAN) 150 MG tablet; Take 1 tablet (150 mg total) by mouth  once.  Other orders -     Vitamin D, Ergocalciferol, (DRISDOL) 50000 units CAPS capsule; Take 1 capsule (50,000 Units total) by mouth every 7 (seven) days.   Added [place]. Discussed increasing by 3 units every 5 days until fasting glucose is 100-120 or reaches 45 units.  Given diflucan likely from GLT-2 side effect if she continues to get yeast infections will consider D/C.  Will write out for 3 more weeks of work then to go back to 1/2 days on 12/21/16 for 3 weeks. Follow up in office in 6 weeks.  Reiterated importance of diet.   Ok for Full time duty on 01/11/17.   Spent 30 minutes with patient and greater than 50 percent of visit spent counseling patient about treatment plan.

## 2016-11-27 ENCOUNTER — Encounter: Payer: Self-pay | Admitting: Physician Assistant

## 2016-11-27 DIAGNOSIS — E538 Deficiency of other specified B group vitamins: Secondary | ICD-10-CM | POA: Insufficient documentation

## 2016-11-27 DIAGNOSIS — E559 Vitamin D deficiency, unspecified: Secondary | ICD-10-CM | POA: Insufficient documentation

## 2016-11-27 LAB — TSH: TSH: 1.36 mIU/L

## 2016-11-27 LAB — CBC
HEMATOCRIT: 41.3 % (ref 35.0–45.0)
Hemoglobin: 13 g/dL (ref 11.7–15.5)
MCH: 23.2 pg — AB (ref 27.0–33.0)
MCHC: 31.5 g/dL — AB (ref 32.0–36.0)
MCV: 73.8 fL — ABNORMAL LOW (ref 80.0–100.0)
MPV: 10.3 fL (ref 7.5–12.5)
Platelets: 288 10*3/uL (ref 140–400)
RBC: 5.6 MIL/uL — ABNORMAL HIGH (ref 3.80–5.10)
RDW: 16.9 % — ABNORMAL HIGH (ref 11.0–15.0)
WBC: 4.7 10*3/uL (ref 3.8–10.8)

## 2016-11-27 LAB — COMPREHENSIVE METABOLIC PANEL
ALT: 13 U/L (ref 6–29)
AST: 13 U/L (ref 10–35)
Albumin: 4.2 g/dL (ref 3.6–5.1)
Alkaline Phosphatase: 70 U/L (ref 33–115)
BILIRUBIN TOTAL: 0.3 mg/dL (ref 0.2–1.2)
BUN: 13 mg/dL (ref 7–25)
CALCIUM: 9.4 mg/dL (ref 8.6–10.2)
CHLORIDE: 108 mmol/L (ref 98–110)
CO2: 20 mmol/L (ref 20–31)
Creat: 1.16 mg/dL — ABNORMAL HIGH (ref 0.50–1.10)
GLUCOSE: 113 mg/dL — AB (ref 65–99)
Potassium: 4.5 mmol/L (ref 3.5–5.3)
SODIUM: 140 mmol/L (ref 135–146)
Total Protein: 7 g/dL (ref 6.1–8.1)

## 2016-11-27 LAB — VITAMIN D 25 HYDROXY (VIT D DEFICIENCY, FRACTURES): VIT D 25 HYDROXY: 10 ng/mL — AB (ref 30–100)

## 2016-11-27 LAB — VITAMIN B12: VITAMIN B 12: 238 pg/mL (ref 200–1100)

## 2016-11-27 LAB — SEDIMENTATION RATE: Sed Rate: 10 mm/hr (ref 0–20)

## 2016-11-27 LAB — FERRITIN: FERRITIN: 35 ng/mL (ref 10–232)

## 2016-11-27 LAB — C-REACTIVE PROTEIN: CRP: 1.8 mg/L (ref <8.0)

## 2016-11-27 MED ORDER — VITAMIN D (ERGOCALCIFEROL) 1.25 MG (50000 UNIT) PO CAPS: 50000.0000 [IU] | ORAL_CAPSULE | ORAL | 0 refills | Status: DC

## 2016-11-27 NOTE — Progress Notes (Signed)
Call pt: B12 is low. We could do a trial of OTC daily for 6 weeks then recheck. If not absorbing may need to consider injections.  Vitamin D is extremely low. Need to start high dose weekly and recheck in 3 months.

## 2016-11-28 ENCOUNTER — Encounter: Payer: Self-pay | Admitting: Physician Assistant

## 2016-12-04 ENCOUNTER — Telehealth: Payer: Self-pay | Admitting: *Deleted

## 2016-12-04 NOTE — Telephone Encounter (Signed)
All I have she what she expressed in "confirm high blood sugars, vision blurriness, frequent headaches, struggling to concentrate". This is what I will communicate to them.

## 2016-12-04 NOTE — Telephone Encounter (Signed)
Patient called and states HR says that the paper work that was sent in needs to be more specific. Patient says the paper work needs to say she out of work and the restrictions she has. The patient states HR is going to call on Monday to set up a peer to peer with Lesly Rubenstein but if she can fax in more detailed paper, that would be helpful.

## 2016-12-07 ENCOUNTER — Ambulatory Visit: Payer: 59 | Admitting: Physician Assistant

## 2016-12-07 NOTE — Telephone Encounter (Signed)
Noted. I believe Lesly Rubenstein did a peer to peer with the provider last week

## 2016-12-21 ENCOUNTER — Other Ambulatory Visit: Payer: Self-pay | Admitting: *Deleted

## 2016-12-21 DIAGNOSIS — Z794 Long term (current) use of insulin: Principal | ICD-10-CM

## 2016-12-21 DIAGNOSIS — E1122 Type 2 diabetes mellitus with diabetic chronic kidney disease: Secondary | ICD-10-CM

## 2016-12-21 MED ORDER — DAPAGLIFLOZIN PROPANEDIOL 10 MG PO TABS: 10.0000 mg | ORAL_TABLET | Freq: Every day | ORAL | 0 refills | Status: DC

## 2016-12-24 ENCOUNTER — Other Ambulatory Visit: Payer: Self-pay | Admitting: *Deleted

## 2016-12-24 DIAGNOSIS — E1122 Type 2 diabetes mellitus with diabetic chronic kidney disease: Secondary | ICD-10-CM

## 2016-12-24 DIAGNOSIS — Z794 Long term (current) use of insulin: Principal | ICD-10-CM

## 2016-12-24 MED ORDER — INSULIN DEGLUDEC 100 UNIT/ML ~~LOC~~ SOPN: 20.0000 [IU] | PEN_INJECTOR | Freq: Every day | SUBCUTANEOUS | 0 refills | Status: DC

## 2017-01-03 ENCOUNTER — Other Ambulatory Visit: Payer: Self-pay | Admitting: Physician Assistant

## 2017-01-03 DIAGNOSIS — F411 Generalized anxiety disorder: Secondary | ICD-10-CM

## 2017-01-05 ENCOUNTER — Encounter: Payer: Self-pay | Admitting: Physician Assistant

## 2017-01-05 ENCOUNTER — Ambulatory Visit (INDEPENDENT_AMBULATORY_CARE_PROVIDER_SITE_OTHER): Payer: 59 | Admitting: Physician Assistant

## 2017-01-05 VITALS — BP 127/70 | HR 103 | Ht 62.0 in | Wt 252.0 lb

## 2017-01-05 DIAGNOSIS — R519 Headache, unspecified: Secondary | ICD-10-CM | POA: Insufficient documentation

## 2017-01-05 DIAGNOSIS — R51 Headache: Secondary | ICD-10-CM

## 2017-01-05 DIAGNOSIS — E083553 Diabetes mellitus due to underlying condition with stable proliferative diabetic retinopathy, bilateral: Secondary | ICD-10-CM

## 2017-01-05 DIAGNOSIS — I10 Essential (primary) hypertension: Secondary | ICD-10-CM | POA: Diagnosis not present

## 2017-01-05 DIAGNOSIS — E1122 Type 2 diabetes mellitus with diabetic chronic kidney disease: Secondary | ICD-10-CM

## 2017-01-05 DIAGNOSIS — E559 Vitamin D deficiency, unspecified: Secondary | ICD-10-CM

## 2017-01-05 DIAGNOSIS — Z794 Long term (current) use of insulin: Secondary | ICD-10-CM

## 2017-01-05 MED ORDER — SITAGLIPTIN PHOSPHATE 100 MG PO TABS: 100.0000 mg | ORAL_TABLET | Freq: Every day | ORAL | 1 refills | Status: DC

## 2017-01-05 MED ORDER — INSULIN DEGLUDEC 100 UNIT/ML ~~LOC~~ SOPN: 20.0000 [IU] | PEN_INJECTOR | Freq: Every day | SUBCUTANEOUS | 0 refills | Status: DC

## 2017-01-05 MED ORDER — DULAGLUTIDE 1.5 MG/0.5ML ~~LOC~~ SOAJ: 1.5000 mg | SUBCUTANEOUS | 0 refills | Status: DC

## 2017-01-05 MED ORDER — DAPAGLIFLOZIN PROPANEDIOL 10 MG PO TABS: 10.0000 mg | ORAL_TABLET | Freq: Every day | ORAL | 0 refills | Status: DC

## 2017-01-05 MED ORDER — TOPIRAMATE ER 50 MG PO CAP24: 1.0000 | ORAL_CAPSULE | Freq: Every day | ORAL | 0 refills | Status: DC

## 2017-01-05 NOTE — Progress Notes (Addendum)
   Subjective:    Patient ID: Jasmine [name], female    DOB: 1966/01/21, 51 y.o.   MRN: [id number]  HPI  Pt is a 51 yo female who presents to the clinic to follow up on FMLA for DM. I had written her out of work for complications due to DM such as vision changes, headaches and inability to see her computer screen at work.  She is checking her sugars and running between 115 and 190's. This morning 115. She has a hx of getting her sugars under control and then spiking again.  She does have diabetic retinopathy but has not been a year and opthomologist will not recheck until sugar is more controlled.  She is currently back to work at half days. On June 4th she will go back to full time. She complains of daily headache that has started in the last 6 weeks. She has missed 8 hours on 5/23, 6/1, 6/4. Occurs on both sides. Denies any n/v/vision changes. Explains headache as dull. She is taking some tylenol which does not help. She denies any sound or light sensitivity.   Review of Systems  All other systems reviewed and are negative.      Objective:   Physical Exam  Constitutional: She is oriented to person, place, and time. She appears well-developed and well-nourished.  HENT:  Head: Normocephalic and atraumatic.  Cardiovascular: Normal rate, regular rhythm and normal heart sounds.   Pulmonary/Chest: Effort normal and breath sounds normal.  Neurological: She is alert and oriented to person, place, and time.  Psychiatric: She has a normal mood and affect. Her behavior is normal.          Assessment & Plan:  Marland Kitchen.Marland Kitchen.Diagnoses and all orders for this visit:  Essential hypertension, benign  Diabetes mellitus due to underlying condition with stable proliferative retinopathy of both eyes, with long-term current use of insulin (HCC) -     Hemoglobin A1c  Type 2 diabetes mellitus with chronic kidney disease, with long-term current use of insulin, unspecified CKD stage (HCC) -     Hemoglobin A1c -      sitaGLIPtin (JANUVIA) 100 MG tablet; Take 1 tablet (100 mg total) by mouth daily. -     insulin degludec (TRESIBA) 100 UNIT/ML SOPN FlexTouch Pen; Inject 0.2 mLs (20 Units total) into the skin daily at 10 pm. Increase by 2 units every 3 days until fasting sugars below 120. -     Dulaglutide (TRULICITY) 1.5 MG/0.5ML SOPN; Inject 1.5 mg into the skin once a week. -     dapagliflozin propanediol (FARXIGA) 10 MG TABS tablet; Take 10 mg by mouth daily.  Frequent headaches  Vitamin D deficiency -     VITAMIN D 25 Hydroxy (Vit-D Deficiency, Fractures)  Other orders -     Topiramate ER 50 MG CP24; Take 1 tablet by mouth daily.   BP controlled.  Unclear of what is causing headache. Due to kidney function not to take NSAIDs. Tylenol as needed.  Started topamax and could also help with weight loss. Discussed side effects. If headache persist then needs to see neurologist.  Continue on current DM medications. A1C to check after June 13th to see what adjustments need to be made. If not back under control discussed sending to endocrinology.  Pt should return to work on 01/11/17 full time. Will provide note to company for absence due to headache on 5/23, 6/1, 6/4 for whole day for headaches.

## 2017-01-11 ENCOUNTER — Ambulatory Visit: Payer: 59 | Admitting: Physician Assistant

## 2017-01-13 ENCOUNTER — Telehealth: Payer: Self-pay | Admitting: Physician Assistant

## 2017-01-13 NOTE — Telephone Encounter (Signed)
Fax to: [phone]

## 2017-01-13 NOTE — Telephone Encounter (Signed)
Pt called to get last OV note faxed over for her short term disability. Pt also needs a letter written by PCP excusing her from work on 12/30/16, 01/08/17, 01/11/17. Routing to PCP for review.

## 2017-01-13 NOTE — Telephone Encounter (Signed)
I placed paperwork on your desk. I placed dates in paperwork.

## 2017-01-14 NOTE — Telephone Encounter (Signed)
Paperwork completed and faxed. Copy sent to be scanned. Left VM advising Pt that original form ready for pick up.

## 2017-01-19 ENCOUNTER — Telehealth: Payer: Self-pay

## 2017-01-19 NOTE — Telephone Encounter (Signed)
FMLA forms was filled out for pt back in May for her to work 1/2 days from 5/14-6/4.  During that time she missed 5/23, 6/1, and 6/4.  She wanting a letter stating that she missed those days due to her medical condition and that she needs to be covered for 8 hour days and not 1/2 days on those days. Will route to PCP. -EH/RMA

## 2017-01-19 NOTE — Telephone Encounter (Signed)
Ok for letter for above days. Let patient know that I am not going to continue here forward to write out for every day she doesn't go to work. She has 32 hours every month she can miss already due to knee pain. Days should fall under that.

## 2017-01-20 ENCOUNTER — Telehealth: Payer: Self-pay | Admitting: *Deleted

## 2017-01-20 NOTE — Telephone Encounter (Signed)
Patient called and left a message stating [facility]. Most likely this is in regard to [facility] as I spoke with her yesterday about this. She asked that amber return the call

## 2017-01-21 NOTE — Telephone Encounter (Signed)
After speaking with pt yesterday, I am very confused as to what the FMLA/Disabilty people are requiring from us.  I obtained information of the woman at [facility] that the pt has been speaking with so I will try to get some clarification on what is needed.

## 2017-01-22 ENCOUNTER — Encounter: Payer: Self-pay | Admitting: Physician Assistant

## 2017-01-22 ENCOUNTER — Ambulatory Visit (INDEPENDENT_AMBULATORY_CARE_PROVIDER_SITE_OTHER): Payer: 59 | Admitting: Physician Assistant

## 2017-01-22 ENCOUNTER — Ambulatory Visit: Payer: 59 | Admitting: Physician Assistant

## 2017-01-22 VITALS — BP 140/75 | HR 88 | Wt 252.0 lb

## 2017-01-22 DIAGNOSIS — E083553 Diabetes mellitus due to underlying condition with stable proliferative diabetic retinopathy, bilateral: Secondary | ICD-10-CM

## 2017-01-22 DIAGNOSIS — Z794 Long term (current) use of insulin: Secondary | ICD-10-CM

## 2017-01-22 DIAGNOSIS — R51 Headache: Secondary | ICD-10-CM

## 2017-01-22 DIAGNOSIS — R519 Headache, unspecified: Secondary | ICD-10-CM

## 2017-01-22 LAB — POCT GLYCOSYLATED HEMOGLOBIN (HGB A1C): Hemoglobin A1C: 7.8

## 2017-01-22 NOTE — Progress Notes (Signed)
Subjective:    Patient ID: Jasmine [name], female    DOB: 03-Nov-1965, 51 y.o.   MRN: [id number]  HPI  Pt is a 51 yo female with DM who presents to the clinic to follow up on FMLA paperwork.   DM- pt is back to full time work week with no restrictions on 01/11/17 due to DM complications. She is checking her sugars and improved a lot. This morning was 116 and yesterday 100. She is taking her medications as directed. She is trying to make better diet choices but admits she still eats a lot of carbs and sugars. She has felt shaky a few times in the morning but ate and shakiness went away. Vision has improved. Not yet time for annual recheck. No open sores or wounds.   Headaches- improved since starting trokendi. She is having less headaches. She does need work note for a few days missed due to headache a few weeks ago. She is due for annual recheck.   .. Active Ambulatory Problems    Diagnosis Date Noted  . Depression 02/09/2010  . ALLERGIC RHINITIS 02/09/2010  . Morbid obesity with BMI of 50.0-59.9, adult (HCC) 06/03/2012  . Type 2 diabetes mellitus (HCC) 11/27/2013  . Hyperlipidemia 11/27/2013  . Primary osteoarthritis of both knees 11/27/2013  . Chronic renal insufficiency, stage II (mild) 11/28/2013  . Umbilical hernia without obstruction and without gangrene 02/02/2014  . Ventral hernia, recurrent 03/28/2014  . Essential hypertension, benign 01/09/2015  . Diabetic retinopathy (HCC) 03/12/2016  . Hyperlipidemia associated with type 2 diabetes mellitus (HCC) 06/17/2016  . Anxiety state 06/17/2016  . Tobacco use 08/13/2016  . Micturition syncope 08/13/2016  . H. pylori infection 08/20/2016  . Vitamin D deficiency 11/27/2016  . B12 deficiency 11/27/2016  . 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 Frequent headaches 01/05/2017  . Morbid obesity (HCC) 01/25/2017    Resolved Ambulatory Problems    Diagnosis Date Noted  . No Resolved Ambulatory Problems   Past Medical History:  Diagnosis Date  . Anxiety   . Arthritis   . Chronic renal insufficiency   . Depression   . Headache   . Hypercholesteremia   . Hypertension   . Numbness and tingling of right arm and leg   . Seasonal allergies   . Type II diabetes mellitus (HCC)   . Umbilical hernia without mention of obstruction or gangrene   . Wears glasses       Review of Systems  All other systems reviewed and are negative.      Objective:   Physical Exam  Constitutional: She is oriented to person, place, and time. She appears well-developed and well-nourished.  HENT:  Head: Normocephalic and atraumatic.  Eyes: Conjunctivae and EOM are normal. Pupils are equal, round, and reactive to light. Right eye exhibits no discharge. Left eye exhibits no discharge.  Cardiovascular: Normal rate, regular rhythm and normal heart sounds.   Pulmonary/Chest: Effort normal and breath sounds normal.  Neurological: She is alert and oriented to person, place, and time.  Psychiatric: She has a normal mood and affect. Her behavior is normal.          Assessment & Plan:   Marland Kitchen.Marland Kitchen.[name] was seen today for f/u fmla paper work.  Diagnoses and all orders for this visit:  Diabetes mellitus due to underlying condition with stable proliferative retinopathy of both eyes, with long-term current use of  insulin (HCC) -     POCT HgB A1C  Frequent headaches  Morbid obesity (HCC)     .. Lab Results  Component Value Date   HGBA1C 7.8 01/22/2017   Down from 11.4 at  3 months ago.  This is great. Keep on same medications.  On ARB. On ACE. BP at goal today.  Follow up in 3 months.    Discussed increase of trokendi. She declines. She denies any side effects. Continue on this for headache prevention. Encouraged to get annual exam soon to make sure eyes have not changed. Note written for work to excuse her  for days missed due to headaches.   Spent 30 minutes filling out FMLA paperwork and witting notes for missed work. Greater than 50 percent of visit spent counseling patient about DM and diet control/lifestyle changes.

## 2017-02-17 ENCOUNTER — Other Ambulatory Visit: Payer: Self-pay | Admitting: Physician Assistant

## 2017-02-18 ENCOUNTER — Telehealth: Payer: Self-pay | Admitting: Physician Assistant

## 2017-02-18 NOTE — Telephone Encounter (Signed)
Per Angela's note on my desk: I called pt and let her know to go to lab and get her VitD level checked when she is available to do so

## 2017-02-24 ENCOUNTER — Other Ambulatory Visit: Payer: Self-pay | Admitting: Physician Assistant

## 2017-02-24 DIAGNOSIS — E1122 Type 2 diabetes mellitus with diabetic chronic kidney disease: Secondary | ICD-10-CM

## 2017-02-24 DIAGNOSIS — Z794 Long term (current) use of insulin: Principal | ICD-10-CM

## 2017-03-25 ENCOUNTER — Other Ambulatory Visit: Payer: Self-pay | Admitting: Physician Assistant

## 2017-04-06 ENCOUNTER — Other Ambulatory Visit: Payer: Self-pay

## 2017-04-06 DIAGNOSIS — Z794 Long term (current) use of insulin: Principal | ICD-10-CM

## 2017-04-06 DIAGNOSIS — E083553 Diabetes mellitus due to underlying condition with stable proliferative diabetic retinopathy, bilateral: Secondary | ICD-10-CM

## 2017-04-06 NOTE — Progress Notes (Signed)
Jasmine [name] states she would like to be referred to an Endocrinologist. Sent referral.

## 2017-04-06 NOTE — Progress Notes (Signed)
Perfect

## 2017-04-07 ENCOUNTER — Encounter: Payer: Self-pay | Admitting: Endocrinology

## 2017-04-07 ENCOUNTER — Ambulatory Visit (INDEPENDENT_AMBULATORY_CARE_PROVIDER_SITE_OTHER): Payer: 59 | Admitting: Endocrinology

## 2017-04-07 VITALS — BP 128/78 | HR 85 | Wt 258.6 lb

## 2017-04-07 DIAGNOSIS — E1122 Type 2 diabetes mellitus with diabetic chronic kidney disease: Secondary | ICD-10-CM

## 2017-04-07 DIAGNOSIS — Z794 Long term (current) use of insulin: Secondary | ICD-10-CM | POA: Diagnosis not present

## 2017-04-07 LAB — POCT GLYCOSYLATED HEMOGLOBIN (HGB A1C): Hemoglobin A1C: 7.1

## 2017-04-07 MED ORDER — INSULIN DEGLUDEC 100 UNIT/ML ~~LOC~~ SOPN: 10.0000 [IU] | PEN_INJECTOR | Freq: Every day | SUBCUTANEOUS | 0 refills | Status: DC

## 2017-04-07 MED ORDER — INSULIN ASPART 100 UNIT/ML FLEXPEN: 2.0000 [IU] | PEN_INJECTOR | Freq: Three times a day (TID) | SUBCUTANEOUS | 11 refills | Status: DC

## 2017-04-07 NOTE — Progress Notes (Signed)
Subjective:    Patient ID: Jasmine [name], female    DOB: 20-Jul-1966, 51 y.o.   MRN: [id number]  HPI pt is referred by Tandy Gaw, PA, for diabetes.  Pt states DM was dx'ed in 2014 (she had GDM in 1992); she has mild neuropathy of the lower extremities; she has associated DR and renal insuff; she has been on insulin since early 2018; pt says her diet and exercise are poor; she has never had pancreatitis, pancreatic surgery, severe hypoglycemia or DKA.  She takes tresiba (25 units qd), trulicity, and farxiga. She has mild hypoglycemia approx twice a week.  This usually happens in the fasting state, but can also happen at lunch or HS. She did not tolerate metformin (tongue swelling).   Past Medical History:  Diagnosis Date  . Anxiety   . Arthritis    "knees" (03/28/2014)  . Chronic renal insufficiency    stage II (mild)  . Depression   . Headache    with high blood sugar  . Hypercholesteremia   . Hypertension   . Numbness and tingling of right arm and leg   . Seasonal allergies   . Type II diabetes mellitus (HCC)   . Umbilical hernia without mention of obstruction or gangrene   . Wears glasses     Past Surgical History:  Procedure Laterality Date  . INSERTION OF MESH N/A 03/28/2014   Procedure: INSERTION OF MESH;  Surgeon: Axel Filler, MD;  Location: [facility] OR;  Service: General;  Laterality: N/A;  . INSERTION OF MESH N/A 05/08/2015   Procedure: INSERTION OF MESH;  Surgeon: Axel Filler, MD;  Location: [facility] OR;  Service: General;  Laterality: N/A;  . LAPAROSCOPIC LYSIS OF ADHESIONS N/A 05/08/2015   Procedure: LAPAROSCOPIC LYSIS OF ADHESIONS;  Surgeon: Axel Filler, MD;  Location: [facility] OR;  Service: General;  Laterality: N/A;  . MULTIPLE TOOTH EXTRACTIONS  ~ 2003  . TONSILLECTOMY    . UMBILICAL HERNIA REPAIR  1973  . VENTRAL HERNIA REPAIR N/A 03/28/2014   Procedure: LAPAROSCOPIC VENTRAL HERNIA;  Surgeon: Axel Filler, MD;  Location: [facility] OR;  Service: General;  Laterality: N/A;   . VENTRAL HERNIA REPAIR N/A 05/08/2015   Procedure: LAPAROSCOPIC VENTRAL HERNIA REPAIR WITH MESH;  Surgeon: Axel Filler, MD;  Location: [facility] OR;  Service: General;  Laterality: N/A;    Social History   Social History  . Marital status: Married    Spouse name: N/A  . Number of children: N/A  . Years of education: N/A   Occupational History  . Not on file.   Social History Main Topics  . Smoking status: Current Some Day Smoker    Packs/day: 0.25    Years: 26.00    Types: Cigarettes  . Smokeless tobacco: Never Used     Comment: down to 1 cigarette a week  . Alcohol use No     Comment: 03/28/2014 "have a drink a few times/year"  . Drug use: No  . Sexual activity: Not Currently   Other Topics Concern  . Not on file   Social History Narrative  . No narrative on file    Current Outpatient Prescriptions on File Prior to Visit  Medication Sig Dispense Refill  . cetirizine (ZYRTEC) 10 MG tablet Take 10 mg by mouth daily as needed for allergies.    . dapagliflozin propanediol (FARXIGA) 10 MG TABS tablet Take 10 mg by mouth daily. 90 tablet 0  . Dulaglutide (TRULICITY) 1.5 MG/0.5ML SOPN Inject 1.5 mg into the skin  once a week. 12 pen 0  . fluticasone (FLONASE) 50 MCG/ACT nasal spray Place 2 sprays into both nostrils daily. 16 g 5  . glucose blood test strip One Touch Ultra meter, test strips & lancets . Check blood sugar 3 times daily. Diagnosis - Diabetes ICD 10 - E 11.22 100 each 11  . losartan-hydrochlorothiazide (HYZAAR) 50-12.5 MG tablet Take 1 tablet by mouth daily. 90 tablet 3  . omeprazole (PRILOSEC) 40 MG capsule Take 1 capsule (40 mg total) by mouth daily. 30 capsule 3  . sertraline (ZOLOFT) 100 MG tablet Take one and one/half tablet daily. 135 tablet 0  . simvastatin (ZOCOR) 10 MG tablet Take 1 tablet (10 mg total) by mouth daily. 90 tablet 3  . traMADol (ULTRAM) 50 MG tablet Take 1 tablet (50 mg total) by mouth every 8 (eight) hours as needed. 30 tablet 0  . meloxicam  (MOBIC) 15 MG tablet One tab PO qAM with breakfast for 2 weeks, then daily prn pain. (Patient not taking: Reported on 04/07/2017) 30 tablet 3  . oxyCODONE-acetaminophen (PERCOCET) 7.5-325 MG tablet Take 1 tablet by mouth every 4 (four) hours as needed for severe pain. (Patient not taking: Reported on 04/07/2017) 30 tablet 0  . Vitamin D, Ergocalciferol, (DRISDOL) 50000 units CAPS capsule TAKE 1 CAPSULE (50,000 UNITS TOTAL) BY MOUTH EVERY 7 (SEVEN) DAYS. NEED FOLLOW UP LABS. (Patient not taking: Reported on 04/07/2017) 4 capsule 0  . [DISCONTINUED] DULoxetine (CYMBALTA) 20 MG capsule Take 1 capsule (20 mg total) by mouth daily. 90 capsule 1   Current Facility-Administered Medications on File Prior to Visit  Medication Dose Route Frequency Provider Last Rate Last Dose  . levonorgestrel (MIRENA) 20 MCG/24HR IUD   Intrauterine Once Ok Edwards, MD        Allergies  Allergen Reactions  . Eggs Or Egg-Derived Products Hives and Other (See Comments)    FLU VACCINE: caused hives    . Influenza Vaccines Hives    Vaccine from Egg derived product causes Hives  . Effexor [Venlafaxine]     "felt weird"  . Metformin And Related Hives    Family History  Problem Relation Age of Onset  . Diabetes Mother   . Diabetes Father   . Cancer Father        Prostate cancer  . Cancer Sister        Cervical cancer  . Diabetes Brother   . Diabetes Sister   . Diabetes Brother     BP 128/78   Pulse 85   Wt 258 lb 9.6 oz (117.3 kg)   LMP 01/24/2015 Comment: perimenopausal  SpO2 96%   BMI 47.30 kg/m    Review of Systems denies blurry vision, chest pain, sob, n/v, muscle cramps, excessive diaphoresis, cold intolerance, and rhinorrhea.  She has gained weight.  She has intermitt headache, depression, easy bruising, and urinary frequency.      Objective:   Physical Exam VS: see vs page GEN: no distress.  Morbid obesity.  HEAD: head: no deformity eyes: no periorbital swelling, but there is bilat  proptosis external nose and ears are normal mouth: no lesion seen NECK: supple, thyroid is not enlarged CHEST WALL: no deformity LUNGS: clear to auscultation CV: reg rate and rhythm, no murmur ABD: abdomen is soft, nontender.  no hepatosplenomegaly.  not distended.  no hernia MUSCULOSKELETAL: muscle bulk and strength are grossly normal.  no obvious joint swelling.  gait is normal and steady EXTEMITIES: no deformity.  no ulcer on the  feet.  feet are of normal color and temp.  Trace bilat edema.  There is bilateral onychomycosis of the toenails PULSES: dorsalis pedis intact bilat.  no carotid bruit NEURO:  cn 2-12 grossly intact.   readily moves all 4's.  sensation is intact to touch on the feet SKIN:  Normal texture and temperature.  No rash or suspicious lesion is visible.   NODES:  None palpable at the neck PSYCH: alert, well-oriented.  Does not appear anxious nor depressed.   A1c=7.1%  Lab Results  Component Value Date   CREATININE 1.16 (H) 11/26/2016   BUN 13 11/26/2016   NA 140 11/26/2016   K 4.5 11/26/2016   CL 108 11/26/2016   CO2 20 11/26/2016   I personally reviewed electrocardiogram tracing (08/06/16): Indication: syncope.  Impression: NSR.  No MI.  No hypertrophy. Compared to 2016: no significant change     Assessment & Plan:  Obesity, new.  She would benefit from surgery Insulin-requiring type 2 DM, with renal insuff: Needs increased rx, if it can be done with a regimen that avoids or minimizes hypoglycemia.  Patient Instructions  good diet and exercise significantly improve the control of your diabetes.  please let me know if you wish to be referred to a dietician.  high blood sugar is very risky to your health.  you should see an eye doctor and dentist every year.  It is very important to get all recommended vaccinations.  Controlling your blood pressure and cholesterol drastically reduces the damage diabetes does to your body.  Those who smoke should quit.   Please discuss these with your doctor.  check your blood sugar twice a day.  vary the time of day when you check, between before the 3 meals, and at bedtime.  also check if you have symptoms of your blood sugar being too high or too low.  please keep a record of the readings and bring it to your next appointment here (or you can bring the meter itself).  You can write it on any piece of paper.  please call us sooner if your blood sugar goes below 70, or if you have a lot of readings over 200. For now, please: Reduce the tresiba to 10 units daily, and: Add "Novolog," 2-4 units 3 times a day (just before each meal, depending on the size of the meal).  Please continue the same other diabetes medications.  Please come back for a follow-up appointment in 2 months.     Bariatric Surgery You have so much to gain by losing weight.  You may have already tried every diet and exercise plan imaginable.  And, you may have sought advice from your family physician, too.   Sometimes, in spite of such diligent efforts, you may not be able to achieve long-term results by yourself.  In cases of severe obesity, bariatric or weight loss surgery is a proven method of achieving long-term weight control.  Our Services Our bariatric surgery programs offer our patients new hope and long-term weight-loss solution.  Since introducing our services in 2003, we have conducted more than 2,400 successful procedures.  Our program is designated as a Investment banker, corporate by the Metabolic and Bariatric Surgery Accreditation and Quality Improvement Program (MBSAQIP), a Child psychotherapist that sets rigorous patient safety and outcome standards.  Our program is also designated as a Engineer, manufacturing systems by [employer].   Our exceptional weight-loss surgery team specializes in diagnosis, treatment, follow-up care, and ongoing support for  our patients with severe weight loss challenges.  We currently offer laparoscopic  sleeve gastrectomy, gastric bypass, and adjustable gastric band (LAP-BAND).    Attend our Bariatrics Seminar Choosing to undergo a bariatric procedure is a big decision, and one that should not be taken lightly.  You now have two options in how you learn about weight-loss surgery - in person or online.  Our objective is to ensure you have all of the information that you need to evaluate the advantages and obligations of this life changing procedure.  Please note that you are not alone in this process, and our experienced team is ready to assist and answer all of your questions.  There are several ways to register for a seminar (either on-line or in person): 1)  Call [phone] 2) Go on-line to [facility] and register for either type of seminar.  [email or web address]

## 2017-04-07 NOTE — Patient Instructions (Addendum)
good diet and exercise significantly improve the control of your diabetes.  please let me know if you wish to be referred to a dietician.  high blood sugar is very risky to your health.  you should see an eye doctor and dentist every year.  It is very important to get all recommended vaccinations.  Controlling your blood pressure and cholesterol drastically reduces the damage diabetes does to your body.  Those who smoke should quit.  Please discuss these with your doctor.  check your blood sugar twice a day.  vary the time of day when you check, between before the 3 meals, and at bedtime.  also check if you have symptoms of your blood sugar being too high or too low.  please keep a record of the readings and bring it to your next appointment here (or you can bring the meter itself).  You can write it on any piece of paper.  please call us sooner if your blood sugar goes below 70, or if you have a lot of readings over 200. For now, please: Reduce the tresiba to 10 units daily, and: Add "Novolog," 2-4 units 3 times a day (just before each meal, depending on the size of the meal).  Please continue the same other diabetes medications.  Please come back for a follow-up appointment in 2 months.     Bariatric Surgery You have so much to gain by losing weight.  You may have already tried every diet and exercise plan imaginable.  And, you may have sought advice from your family physician, too.   Sometimes, in spite of such diligent efforts, you may not be able to achieve long-term results by yourself.  In cases of severe obesity, bariatric or weight loss surgery is a proven method of achieving long-term weight control.  Our Services Our bariatric surgery programs offer our patients new hope and long-term weight-loss solution.  Since introducing our services in 2003, we have conducted more than 2,400 successful procedures.  Our program is designated as a Investment banker, corporate by the Metabolic and Bariatric  Surgery Accreditation and Quality Improvement Program (MBSAQIP), a Child psychotherapist that sets rigorous patient safety and outcome standards.  Our program is also designated as a Engineer, manufacturing systems by [employer].   Our exceptional weight-loss surgery team specializes in diagnosis, treatment, follow-up care, and ongoing support for our patients with severe weight loss challenges.  We currently offer laparoscopic sleeve gastrectomy, gastric bypass, and adjustable gastric band (LAP-BAND).    Attend our Bariatrics Seminar Choosing to undergo a bariatric procedure is a big decision, and one that should not be taken lightly.  You now have two options in how you learn about weight-loss surgery - in person or online.  Our objective is to ensure you have all of the information that you need to evaluate the advantages and obligations of this life changing procedure.  Please note that you are not alone in this process, and our experienced team is ready to assist and answer all of your questions.  There are several ways to register for a seminar (either on-line or in person): 1)  Call [phone] 2) Go on-line to [facility] and register for either type of seminar.  [email or web address]

## 2017-04-09 ENCOUNTER — Other Ambulatory Visit: Payer: Self-pay | Admitting: Physician Assistant

## 2017-04-16 ENCOUNTER — Telehealth: Payer: Self-pay

## 2017-04-16 NOTE — Telephone Encounter (Signed)
We can make referral for downstairs for sara or [place] julie whit?

## 2017-04-16 NOTE — Telephone Encounter (Signed)
Pt would like a recommendation for a therapist.  Please advise.

## 2017-04-19 ENCOUNTER — Telehealth: Payer: Self-pay | Admitting: *Deleted

## 2017-04-19 DIAGNOSIS — F329 Major depressive disorder, single episode, unspecified: Secondary | ICD-10-CM

## 2017-04-19 DIAGNOSIS — F32A Depression, unspecified: Secondary | ICD-10-CM

## 2017-04-19 DIAGNOSIS — F419 Anxiety disorder, unspecified: Secondary | ICD-10-CM

## 2017-04-19 NOTE — Telephone Encounter (Signed)
Referral for counseling placed per pt request.

## 2017-04-22 NOTE — Telephone Encounter (Signed)
Left message to call office with who she would prefer seeing.  Contact information given.

## 2017-05-11 ENCOUNTER — Telehealth: Payer: Self-pay

## 2017-05-11 NOTE — Telephone Encounter (Signed)
Jasmine [name] spoke with patient and got her number to [facility] same day assessment. She denies any suicide plan. She "has just had enough and can't do it anymore". She calls from the break room at work.

## 2017-05-11 NOTE — Telephone Encounter (Signed)
Agree with plan 

## 2017-05-11 NOTE — Telephone Encounter (Signed)
Patient called back and states she spoke with [facility] and they don't think she would qualify for their program. She is concerned and confused about what to do. Per Lesly Rubenstein, I advised patient to go to the ED for an assessment. She is near [facility] so I advised her to go to [facility].

## 2017-05-11 NOTE — Telephone Encounter (Signed)
Jasmine [name] called and left a message for a return call. I called patient back and advised her to call back. She did not leave a reason for the call.

## 2017-05-12 ENCOUNTER — Telehealth: Payer: Self-pay

## 2017-05-12 DIAGNOSIS — F3289 Other specified depressive episodes: Secondary | ICD-10-CM

## 2017-05-12 NOTE — Telephone Encounter (Signed)
Need referral to psychiatry for depression(urgent referral). She is more than welcome to make appt if headache persist. Ok for note for work today.

## 2017-05-12 NOTE — Telephone Encounter (Signed)
Patient advised of recommendations. I placed the referral and called and left a message for them to call patient to schedule as soon as possible.

## 2017-05-12 NOTE — Telephone Encounter (Signed)
Jasmine [name] called back today stating she did not get an assessment at the ED. She states they told her she would have to check herself in. She states she is feeling better today but that she didn't go to work due to having a headache today. Please advise.

## 2017-05-13 ENCOUNTER — Ambulatory Visit (INDEPENDENT_AMBULATORY_CARE_PROVIDER_SITE_OTHER): Payer: 59 | Admitting: Endocrinology

## 2017-05-13 ENCOUNTER — Encounter: Payer: Self-pay | Admitting: Endocrinology

## 2017-05-13 VITALS — BP 118/78 | HR 107 | Wt 261.0 lb

## 2017-05-13 DIAGNOSIS — E1122 Type 2 diabetes mellitus with diabetic chronic kidney disease: Secondary | ICD-10-CM

## 2017-05-13 DIAGNOSIS — Z780 Asymptomatic menopausal state: Secondary | ICD-10-CM | POA: Insufficient documentation

## 2017-05-13 DIAGNOSIS — Z794 Long term (current) use of insulin: Secondary | ICD-10-CM

## 2017-05-13 NOTE — Progress Notes (Signed)
Subjective:    Patient ID: Jasmine [name], female    DOB: 04-Nov-1965, 51 y.o.   MRN: [id number]  HPI  Pt returns for f/u of diabetes mellitus: DM type: Insulin-requiring type 2.  Dx'ed: 2014 Complications: DR and renal insuff Therapy: insulin since early 2018 GDM: 1992 DKA: never Severe hypoglycemia: never Pancreatitis: never Interval history: no cbg record, but states cbg's are in the mid-100's.  There is no trend throughout the day. She takes tresiba 10-20 units/d, and novolog, 2 units 3 times a day (just before each meal). Past Medical History:  Diagnosis Date  . Anxiety   . Arthritis    "knees" (03/28/2014)  . Chronic renal insufficiency    stage II (mild)  . Depression   . Headache    with high blood sugar  . Hypercholesteremia   . Hypertension   . Numbness and tingling of right arm and leg   . Seasonal allergies   . Type II diabetes mellitus (HCC)   . Umbilical hernia without mention of obstruction or gangrene   . Wears glasses     Past Surgical History:  Procedure Laterality Date  . INSERTION OF MESH N/A 03/28/2014   Procedure: INSERTION OF MESH;  Surgeon: Axel Filler, MD;  Location: [facility] OR;  Service: General;  Laterality: N/A;  . INSERTION OF MESH N/A 05/08/2015   Procedure: INSERTION OF MESH;  Surgeon: Axel Filler, MD;  Location: [facility] OR;  Service: General;  Laterality: N/A;  . LAPAROSCOPIC LYSIS OF ADHESIONS N/A 05/08/2015   Procedure: LAPAROSCOPIC LYSIS OF ADHESIONS;  Surgeon: Axel Filler, MD;  Location: [facility] OR;  Service: General;  Laterality: N/A;  . MULTIPLE TOOTH EXTRACTIONS  ~ 2003  . TONSILLECTOMY    . UMBILICAL HERNIA REPAIR  1973  . VENTRAL HERNIA REPAIR N/A 03/28/2014   Procedure: LAPAROSCOPIC VENTRAL HERNIA;  Surgeon: Axel Filler, MD;  Location: [facility] OR;  Service: General;  Laterality: N/A;  . VENTRAL HERNIA REPAIR N/A 05/08/2015   Procedure: LAPAROSCOPIC VENTRAL HERNIA REPAIR WITH MESH;  Surgeon: Axel Filler, MD;  Location: [facility] OR;   Service: General;  Laterality: N/A;    Social History   Social History  . Marital status: Married    Spouse name: N/A  . Number of children: N/A  . Years of education: N/A   Occupational History  . Not on file.   Social History Main Topics  . Smoking status: Current Some Day Smoker    Packs/day: 0.25    Years: 26.00    Types: Cigarettes  . Smokeless tobacco: Never Used     Comment: down to 1 cigarette a week  . Alcohol use No     Comment: 03/28/2014 "have a drink a few times/year"  . Drug use: No  . Sexual activity: Not Currently   Other Topics Concern  . Not on file   Social History Narrative  . No narrative on file    Current Outpatient Prescriptions on File Prior to Visit  Medication Sig Dispense Refill  . cetirizine (ZYRTEC) 10 MG tablet Take 10 mg by mouth daily as needed for allergies.    . dapagliflozin propanediol (FARXIGA) 10 MG TABS tablet Take 10 mg by mouth daily. 90 tablet 0  . Dulaglutide (TRULICITY) 1.5 MG/0.5ML SOPN Inject 1.5 mg into the skin once a week. 12 pen 0  . fluticasone (FLONASE) 50 MCG/ACT nasal spray Place 2 sprays into both nostrils daily. 16 g 5  . glucose blood test strip One Touch Ultra meter, test  strips & lancets . Check blood sugar 3 times daily. Diagnosis - Diabetes ICD 10 - E 11.22 100 each 11  . insulin aspart (NOVOLOG FLEXPEN) 100 UNIT/ML FlexPen Inject 2-4 Units into the skin 3 (three) times daily with meals. And pen needles 3/day 15 mL 11  . insulin degludec (TRESIBA) 100 UNIT/ML SOPN FlexTouch Pen Inject 0.1 mLs (10 Units total) into the skin daily. Increase by 2 units every 3 days until fasting sugars below 120. 12 pen 0  . losartan-hydrochlorothiazide (HYZAAR) 50-12.5 MG tablet Take 1 tablet by mouth daily. 90 tablet 3  . meloxicam (MOBIC) 15 MG tablet One tab PO qAM with breakfast for 2 weeks, then daily prn pain. (Patient not taking: Reported on 04/07/2017) 30 tablet 3  . omeprazole (PRILOSEC) 40 MG capsule Take 1 capsule (40 mg  total) by mouth daily. 30 capsule 3  . sertraline (ZOLOFT) 100 MG tablet Take one and one/half tablet daily. 135 tablet 0  . simvastatin (ZOCOR) 10 MG tablet Take 1 tablet (10 mg total) by mouth daily. 90 tablet 3  . traMADol (ULTRAM) 50 MG tablet Take 1 tablet (50 mg total) by mouth every 8 (eight) hours as needed. 30 tablet 0  . TROKENDI XR 50 MG CP24 TAKE 1 CAPSULE BY MOUTH EVERY DAY 90 capsule 0  . [DISCONTINUED] DULoxetine (CYMBALTA) 20 MG capsule Take 1 capsule (20 mg total) by mouth daily. 90 capsule 1   Current Facility-Administered Medications on File Prior to Visit  Medication Dose Route Frequency Provider Last Rate Last Dose  . levonorgestrel (MIRENA) 20 MCG/24HR IUD   Intrauterine Once Ok Edwards, MD        Allergies  Allergen Reactions  . Eggs Or Egg-Derived Products Hives and Other (See Comments)    FLU VACCINE: caused hives    . Influenza Vaccines Hives    Vaccine from Egg derived product causes Hives  . Effexor [Venlafaxine]     "felt weird"  . Metformin And Related Hives    Family History  Problem Relation Age of Onset  . Diabetes Mother   . Diabetes Father   . Cancer Father        Prostate cancer  . Cancer Sister        Cervical cancer  . Diabetes Brother   . Diabetes Sister   . Diabetes Brother     BP 118/78   Pulse (!) 107   Wt 261 lb (118.4 kg)   LMP 01/24/2015 Comment: perimenopausal  SpO2 97%   BMI 47.74 kg/m   Review of Systems She denies hypoglycemia.  No menses x 2 years    Objective:   Physical Exam VITAL SIGNS:  See vs page.  GENERAL: no distress.  Pulses: foot pulses are intact bilaterally.   MSK: no deformity of the feet or ankles.  CV: no edema of the legs or ankles.  Skin:  no ulcer on the feet or ankles.  normal color and temp on the feet and ankles.   Neuro: sensation is intact to touch on the feet and ankles.       Assessment & Plan:  Insulin-requiring type 2 DM, with DR: stable glycemic control.   Menopause: new to  me.   Patient Instructions  check your blood sugar twice a day.  vary the time of day when you check, between before the 3 meals, and at bedtime.  also check if you have symptoms of your blood sugar being too high or too low.  please  keep a record of the readings and bring it to your next appointment here (or you can bring the meter itself).  You can write it on any piece of paper.  please call us sooner if your blood sugar goes below 70, or if you have a lot of readings over 200. For now, please: Please continue the same diabetes medications, and: blood tests are requested for you today.  We'll let you know about the results.   Please see nancy Young, if you want to discuss possible medication for menopausal symptoms. Please come back for a follow-up appointment in 4 months.

## 2017-05-13 NOTE — Patient Instructions (Addendum)
check your blood sugar twice a day.  vary the time of day when you check, between before the 3 meals, and at bedtime.  also check if you have symptoms of your blood sugar being too high or too low.  please keep a record of the readings and bring it to your next appointment here (or you can bring the meter itself).  You can write it on any piece of paper.  please call us sooner if your blood sugar goes below 70, or if you have a lot of readings over 200. For now, please: Please continue the same diabetes medications, and: blood tests are requested for you today.  We'll let you know about the results.   Please see nancy Young, if you want to discuss possible medication for menopausal symptoms. Please come back for a follow-up appointment in 4 months.

## 2017-05-19 ENCOUNTER — Ambulatory Visit (INDEPENDENT_AMBULATORY_CARE_PROVIDER_SITE_OTHER): Payer: PRIVATE HEALTH INSURANCE | Admitting: Licensed Clinical Social Worker

## 2017-05-19 ENCOUNTER — Telehealth: Payer: Self-pay

## 2017-05-19 DIAGNOSIS — F332 Major depressive disorder, recurrent severe without psychotic features: Secondary | ICD-10-CM

## 2017-05-19 DIAGNOSIS — F411 Generalized anxiety disorder: Secondary | ICD-10-CM | POA: Insufficient documentation

## 2017-05-19 NOTE — Progress Notes (Signed)
Comprehensive Clinical Assessment (CCA) Note  05/19/2017 Jasmine [name] [id number]  Visit Diagnosis:      ICD-10-CM   1. Severe episode of recurrent major depressive disorder, without psychotic features (HCC) F33.2   2. Generalized anxiety disorder F41.1       CCA Part One  Part One has been completed on paper by the patient.  (See scanned document in Chart Review)  CCA Part Two A  Intake/Chief Complaint:  CCA Intake With Chief Complaint CCA Part Two Date: 05/19/17 CCA Part Two Time: 1011 Chief Complaint/Presenting Problem: Depression and anxiety Patients Currently Reported Symptoms/Problems: Reports symptoms became of concern in July.  That month is difficult because it is the anniversary of dad's birthday (he is deceased) and the death of her nephew.  Also experienced changes at work.  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  Not interested in usual activities.  Stays in bed a lot.  Crying spells every day.  She can't sleep.  Gets caught up in her worries.  I can't focus.  "I do good if I can stay focused for 10 minutes."  Craving sweets.  Has periods when she can't sit still and others when she feels slowed down.   Reports she doesn't have anybody to talk to about how she is feeling.  Worries excessively.  Ruminates about bad things that could happen.  Has panic attacks about every other day. I start shaking.  I feel like my heart is beating out of my chest.  Sometimes I get tingling.  Get shortness of breath.  Cries.   Hasn't been driving lately because she was having road rage.   Individual's Strengths: Usually she likes to talk to people and encourage them.   Describes herself as a good listener, dependable, and a motivator.      Has a supportive older sister.  She is very helpful.  Lives in [place]  She also belongs to a church community. Individual's Preferences: Wants to learn coping skills to manage her symptoms so she can function more effectively, especially at work Type of Services  Patient Feels Are Needed: Therapy and medication management Initial Clinical Notes/Concerns: No previous MH treatment  Has had previous episodes of depression.    Mental Health Symptoms Depression:  Depression: Worthlessness, Weight gain/loss, Sleep (too much or little), Irritability, Increase/decrease in appetite, Hopelessness, Fatigue, Difficulty Concentrating, Change in energy/activity  Mania:  Mania: N/A  Anxiety:   Anxiety: Difficulty concentrating, Fatigue, Irritability, Restlessness, Sleep, Tension, Worrying  Psychosis:  Psychosis: N/A  Trauma:  Trauma: N/A  Obsessions:  Obsessions: Recurrent & persistent thoughts/impulses/images  Compulsions:  Compulsions: N/A  Inattention:  Inattention: Fails to pay attention/makes careless mistakes, Forgetful, Poor follow-through on tasks  Hyperactivity/Impulsivity:  Hyperactivity/Impulsivity: N/A  Oppositional/Defiant Behaviors:  Oppositional/Defiant Behaviors: N/A  Borderline Personality:  Emotional Irregularity: N/A  Other Mood/Personality Symptoms:      Mental Status Exam Appearance and self-care  Stature:  Stature: Small  Weight:  Weight: Obese  Clothing:  Clothing: Casual  Grooming:  Grooming: Normal  Cosmetic use:  Cosmetic Use: None  Posture/gait:  Posture/Gait: Normal  Motor activity:  Motor Activity: Not Remarkable  Sensorium  Attention:  Attention: Normal  Concentration:  Concentration: Normal  Orientation:  Orientation: X5  Recall/memory:  Recall/Memory: Normal  Affect and Mood  Affect:  Affect: Anxious  Mood:  Mood: Anxious, Depressed  Relating  Eye contact:  Eye Contact: Normal  Facial expression:  Facial Expression: Anxious  Attitude toward examiner:  Attitude Toward Examiner: Cooperative  Thought  and Language  Speech flow: Speech Flow: Normal  Thought content:  Thought Content: Appropriate to mood and circumstances  Preoccupation:  Preoccupations: Ruminations  Hallucinations:     Organization:     Printmaker of Knowledge:  Fund of Knowledge: Average  Intelligence:  Intelligence: Average  Abstraction:  Abstraction: Normal  Judgement:  Judgement: Normal  Reality Testing:  Reality Testing: Adequate  Insight:  Insight: Fair  Decision Making:  Decision Making: Paralyzed  Social Functioning  Social Maturity:  Social Maturity: Responsible  Social Judgement:  Social Judgement: Normal  Stress  Stressors:  Stressors: Family conflict, Work, Illness, Money, Grief/losses (She has diabetes.  Worries a lot about not bringing in any income when she takes off of work)  Coping Ability:  Coping Ability: Science writer, Horticulturist, commercial Deficits:     Supports:      Family and Psychosocial History: Family history Marital status: Married Number of Years Married: 19 (Together 23 years) What types of issues is patient dealing with in the relationship?: "I'm not happy in my marriage."  Describes him as selfish and spoiled.  He uses drugs.  She believes it is crack cocaine.  Thinks he has been using on and off for 3 years. Additional relationship information: He is out of town a lot for work.   Are you sexually active?: No (Only once in the past year.) Has your sexual activity been affected by drugs, alcohol, medication, or emotional stress?: He has cheated on her before.  She is afraid of getting an STD.   Does patient have children?: Yes How many children?: 1 How is patient's relationship with their children?: Daughter, [name] (25) "We're close.  She is very private though"  Childhood History:  Childhood History By whom was/is the patient raised?: Mother Additional childhood history information: Grew up in [place].  Parents separated when she was about 34 years old.  Dad lived in [place].  He would visit about 5 times a year.   Description of patient's relationship with caregiver when they were a child: Relationship with dad was good.  They talked daily.  Mom "I admired her.  We were  close.  I always slept with her."   Patient's description of current relationship with people who raised him/her: Dad died in 2000-12-26.  He was murdered.  It was the day before her birthday.  Killer has not been found, although she has a good idea of who did it.  "He was the one I would always talk to."   Mom lives in [place].  "[place] still close.  We communicate every day."   How were you disciplined when you got in trouble as a child/adolescent?: She was pretty well behaved.  Didn't need a lot of discipline Does patient have siblings?: Yes Number of Siblings: 4 Description of patient's current relationship with siblings: 2 sisters and 2 brothers  Patient is the youngest.  Oldest sister lives in the area.  Their relationship is distant.  Close with her other sister.  One brother lives in [place].  They talk a couple times a month.  Her other brother has bipolar disorder and has history of drug addiction.  Lives in [place].  Calls patient a lot to ask for things. 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  Not able to concentrate on the calls.   Has patient ever been in the [employer]?: No Are There Guns or Other Weapons in Your Home?: Yes Are These  Weapons Safely Secured?: Yes  Education: Education Did Garment/textile technologist From [employer]?: Yes Did You Attend College?: Yes What Type of College Degree Do you Have?: Did not graduate Did You Have Any Difficulty At School?: No  Religion: Religion/Spirituality Are You A Religious Person?: Yes (Attends church regularly) What is Your Religious Affiliation?: Non-Denominational  Leisure/Recreation: Leisure / Recreation Leisure and Hobbies: Lately she spends a lot of time in her room watching TV  When not depressed she likes to go to the movies, walk around [employer], read  Exercise/Diet: Exercise/Diet Do You Exercise?: No Do You Have Any Trouble Sleeping?: Yes Explanation of Sleeping Difficulties: Not able to stay asleep  CCA Part Two C  Alcohol/Drug Use: Alcohol / Drug Use History of alcohol / drug use?: No history of alcohol / drug abuse                      CCA Part Three  ASAM's:  Six Dimensions of Multidimensional Assessment  Dimension 1:  Acute Intoxication and/or Withdrawal Potential:     Dimension 2:  Biomedical Conditions and Complications:     Dimension 3:  Emotional, Behavioral, or Cognitive Conditions and Complications:     Dimension 4:  Readiness to Change:     Dimension 5:  Relapse, Continued use, or Continued Problem Potential:     Dimension 6:  Recovery/Living Environment:      Substance use Disorder (SUD)  NA  Social Function:  Social Functioning Social Maturity: Responsible Social Judgement: Normal  Stress:  Stress Stressors: Family conflict, Work, Illness, Money, Grief/losses (She has diabetes.  Worries a lot about not bringing in any income when she takes off of work) Coping Ability: Overwhelmed, Exhausted Patient Takes Medications The Way The Doctor Instructed?: Yes  Risk Assessment- Self-Harm Potential: Risk Assessment For Self-Harm Potential Thoughts of Self-Harm: No current thoughts Additional Comments for Self-Harm Potential: Denies  history of harm to self  Risk Assessment -Dangerous to Others Potential: Risk Assessment For Dangerous to Others Potential Method: No Plan Additional Comments for Danger to Others Potential: Denies history of harm to others  DSM5 Diagnoses: Patient Active Problem List   Diagnosis Date Noted  . Severe episode of recurrent major depressive disorder, without psychotic features (HCC) 05/19/2017  . Generalized anxiety disorder 05/19/2017  . Menopause 05/13/2017  . Morbid obesity (HCC) 01/25/2017  . 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 Frequent headaches 01/05/2017  . Vitamin D deficiency 11/27/2016  . B12 deficiency 11/27/2016  . H. pylori infection 08/20/2016  . Tobacco use 08/13/2016  . Micturition syncope 08/13/2016  . Hyperlipidemia associated with type 2 diabetes mellitus (HCC) 06/17/2016  . Anxiety state 06/17/2016  . Diabetic retinopathy (HCC) 03/12/2016  .  Essential hypertension, benign 01/09/2015  . Ventral hernia, recurrent 03/28/2014  . Umbilical hernia without obstruction and without gangrene 02/02/2014  . Chronic renal insufficiency, stage II (mild) 11/28/2013  . Type 2 diabetes mellitus (HCC) 11/27/2013  . Hyperlipidemia 11/27/2013  . Primary osteoarthritis of both knees 11/27/2013  . Morbid obesity with BMI of 50.0-59.9, adult (HCC) 06/03/2012  . Depression 02/09/2010  . ALLERGIC RHINITIS 02/09/2010      Recommendations for Services/Supports/Treatments: Recommendations for Services/Supports/Treatments Recommendations For Services/Supports/Treatments: IOP (Intensive Outpatient Program)  Provided patient with phone number for Boneta Lucks, the therapist who runs [facility].  If accepted into the program patient plans to file for short term disability so that she can take time off of work to focus on [facility] recovery.        Marilu Favre

## 2017-05-19 NOTE — Telephone Encounter (Signed)
Called pt, informed, transferred to scheduling for appointment with [facility].

## 2017-05-19 NOTE — Telephone Encounter (Signed)
Pt saw [name] at [facility] this morning, [name] referred her to out patient behavioral health which the earliest they can get her in is Oct. 22.  They are requesting you to fill out the short term disability paperwork. Are you able to do this since you have followed this patient?  Please advise.

## 2017-05-19 NOTE — Telephone Encounter (Signed)
She will need a new appt since this is due to another concern that she does not have an office visit for. I will be out of town for next week so She should be able to see charley and bring in paperwork so that she can write her out until her psych appt. Please bring paperwork in so they can fill out together in room.

## 2017-05-20 ENCOUNTER — Encounter: Payer: Self-pay | Admitting: Physician Assistant

## 2017-05-20 ENCOUNTER — Ambulatory Visit (INDEPENDENT_AMBULATORY_CARE_PROVIDER_SITE_OTHER): Payer: 59 | Admitting: Physician Assistant

## 2017-05-20 VITALS — BP 118/77 | HR 80 | Wt 263.0 lb

## 2017-05-20 DIAGNOSIS — F411 Generalized anxiety disorder: Secondary | ICD-10-CM

## 2017-05-20 DIAGNOSIS — Z0279 Encounter for issue of other medical certificate: Secondary | ICD-10-CM

## 2017-05-20 DIAGNOSIS — F332 Major depressive disorder, recurrent severe without psychotic features: Secondary | ICD-10-CM | POA: Diagnosis not present

## 2017-05-20 MED ORDER — BUPROPION HCL ER (XL) 150 MG PO TB24: 150.0000 mg | ORAL_TABLET | Freq: Every day | ORAL | 3 refills | Status: DC

## 2017-05-20 NOTE — Progress Notes (Signed)
HPI:                                                                Jasmine [name] is a 51 y.o. female who presents to [facility] today for short-term disability and depression  Depression       The patient presents with depression.  This is a recurrent problem.  The current episode started more than 1 month ago.   The onset quality is gradual.   The problem occurs constantly.  The problem has been gradually worsening since onset.  Associated symptoms include decreased concentration, fatigue, hopelessness, insomnia, irritable, restlessness, decreased interest, appetite change and sad.  Associated symptoms include no suicidal ideas.     The symptoms are aggravated by work stress and family issues.  Past treatments include SSRIs - Selective serotonin reuptake inhibitors and psychotherapy.  Compliance with treatment is good.  Past medical history includes chronic illness (diabetes), anxiety and depression.     Pertinent negatives include no recent psychiatric admission and no suicide attempts.  Patient was evaluated by behavioral health LCSW yesterday, who recommended temporary leave of absence from work and MHIOP program.  Past Medical History:  Diagnosis Date  . Anxiety   . Arthritis    "knees" (03/28/2014)  . Chronic renal insufficiency    stage II (mild)  . Depression   . Headache    with high blood sugar  . Hypercholesteremia   . Hypertension   . Numbness and tingling of right arm and leg   . Seasonal allergies   . Type II diabetes mellitus (HCC)   . Umbilical hernia without mention of obstruction or gangrene   . Wears glasses    Past Surgical History:  Procedure Laterality Date  . INSERTION OF MESH N/A 03/28/2014   Procedure: INSERTION OF MESH;  Surgeon: Axel Filler, MD;  Location: [facility] OR;  Service: General;  Laterality: N/A;  . INSERTION OF MESH N/A 05/08/2015   Procedure: INSERTION OF MESH;  Surgeon: Axel Filler, MD;   Location: [facility] OR;  Service: General;  Laterality: N/A;  . LAPAROSCOPIC LYSIS OF ADHESIONS N/A 05/08/2015   Procedure: LAPAROSCOPIC LYSIS OF ADHESIONS;  Surgeon: Axel Filler, MD;  Location: [facility] OR;  Service: General;  Laterality: N/A;  . MULTIPLE TOOTH EXTRACTIONS  ~ 2003  . TONSILLECTOMY    . UMBILICAL HERNIA REPAIR  1973  . VENTRAL HERNIA REPAIR N/A 03/28/2014   Procedure: LAPAROSCOPIC VENTRAL HERNIA;  Surgeon: Axel Filler, MD;  Location: [facility] OR;  Service: General;  Laterality: N/A;  . VENTRAL HERNIA REPAIR N/A 05/08/2015   Procedure: LAPAROSCOPIC VENTRAL HERNIA REPAIR WITH MESH;  Surgeon: Axel Filler, MD;  Location: [facility] OR;  Service: General;  Laterality: N/A;   Social History  Substance Use Topics  . Smoking status: Current Some Day Smoker    Packs/day: 0.25    Years: 26.00    Types: Cigarettes  . Smokeless tobacco: Never Used     Comment: down to 1 cigarette a week  . Alcohol use No     Comment: 03/28/2014 "have a drink a few times/year"   family history includes Cancer in her father and sister; Diabetes in her brother, brother, father, mother, and sister.  ROS: negative except as noted  in the HPI  Medications: Current Outpatient Prescriptions  Medication Sig Dispense Refill  . buPROPion (WELLBUTRIN XL) 150 MG 24 hr tablet Take 1 tablet (150 mg total) by mouth daily. 30 tablet 3  . cetirizine (ZYRTEC) 10 MG tablet Take 10 mg by mouth daily as needed for allergies.    . dapagliflozin propanediol (FARXIGA) 10 MG TABS tablet Take 10 mg by mouth daily. 90 tablet 0  . Dulaglutide (TRULICITY) 1.5 MG/0.5ML SOPN Inject 1.5 mg into the skin once a week. 12 pen 0  . fluticasone (FLONASE) 50 MCG/ACT nasal spray Place 2 sprays into both nostrils daily. 16 g 5  . glucose blood test strip One Touch Ultra meter, test strips & lancets . Check blood sugar 3 times daily. Diagnosis - Diabetes ICD 10 - E 11.22 100 each 11  . insulin aspart (NOVOLOG FLEXPEN) 100 UNIT/ML FlexPen Inject 2-4 Units  into the skin 3 (three) times daily with meals. And pen needles 3/day 15 mL 11  . insulin degludec (TRESIBA) 100 UNIT/ML SOPN FlexTouch Pen Inject 0.1 mLs (10 Units total) into the skin daily. Increase by 2 units every 3 days until fasting sugars below 120. 12 pen 0  . losartan-hydrochlorothiazide (HYZAAR) 50-12.5 MG tablet Take 1 tablet by mouth daily. 90 tablet 3  . omeprazole (PRILOSEC) 40 MG capsule Take 1 capsule (40 mg total) by mouth daily. 30 capsule 3  . sertraline (ZOLOFT) 100 MG tablet Take one and one/half tablet daily. 135 tablet 0  . simvastatin (ZOCOR) 10 MG tablet Take 1 tablet (10 mg total) by mouth daily. 90 tablet 3  . traMADol (ULTRAM) 50 MG tablet Take 1 tablet (50 mg total) by mouth every 8 (eight) hours as needed. 30 tablet 0  . TROKENDI XR 50 MG CP24 TAKE 1 CAPSULE BY MOUTH EVERY DAY 90 capsule 0   Current Facility-Administered Medications  Medication Dose Route Frequency Provider Last Rate Last Dose  . levonorgestrel (MIRENA) 20 MCG/24HR IUD   Intrauterine Once Ok Edwards, MD       Allergies  Allergen Reactions  . Eggs Or Egg-Derived Products Hives and Other (See Comments)    FLU VACCINE: caused hives    . Influenza Vaccines Hives    Vaccine from Egg derived product causes Hives  . Effexor [Venlafaxine]     "felt weird"  . Metformin And Related Hives       Objective:  BP 118/77   Pulse 80   Wt 263 lb (119.3 kg)   LMP 01/24/2015 Comment: perimenopausal  BMI 48.10 kg/m  Gen:  alert, not ill-appearing, no distress, appropriate for age, morbidly obese female HEENT: head normocephalic without obvious abnormality, conjunctiva and cornea clear, wearing glasses, trachea midline Pulm: Normal work of breathing, normal phonation Neuro: alert and oriented x 3, no tremor MSK: extremities atraumatic, normal gait and station Skin: intact, no rashes on exposed skin, no jaundice, no cyanosis Psych: well-groomed, cooperative, good eye contact, depressed mood,  affect mood-congruent, speech is articulate, and thought processes clear and goal-directed, no suicidal ideations   No results found.   Depression screen [facility] 2/9 05/20/2017 05/19/2017 10/20/2016  Decreased Interest 3 3 0  Down, Depressed, Hopeless PHQ - 2 Score Altered sleeping Tired, decreased energy Change in appetite Feeling bad or failure about yourself  Trouble concentrating Moving slowly or fidgety/restless 3  0 0  Suicidal thoughts 0 - 0  PHQ-9 Score Difficult doing work/chores Extremely dIfficult - -     GAD 7 : Generalized Anxiety Score 05/20/2017 05/19/2017  Nervous, Anxious, on Edge 3 3  Control/stop worrying 3 3  Worry too much - different things 3 3  Trouble relaxing 3 3  Restless 3 3  Easily annoyed or irritable 3 3  Afraid - awful might happen 3 3  Total GAD 7 Score 21 21  Anxiety Difficulty Extremely difficult Extremely difficult      Assessment and Plan: 51 y.o. female with   1. Severe episode of recurrent major depressive disorder, without psychotic features (HCC) - PHQ9 score 23, severe; no acute safety issues - augmenting Sertraline with Wellbutrin. Patient plans to participate in MHIOP program beginning 05/31/17 - buPROPion (WELLBUTRIN XL) 150 MG 24 hr tablet; Take 1 tablet (150 mg total) by mouth daily.  Dispense: 30 tablet; Refill: 3  2. Generalized anxiety disorder - GAD7 score 21, severe - cont sertraline - cont  - MHIOP program as above  3. Encounter for issuance of medical certificate - I feel patient would benefit immensely from intensive outpatient program. She has no acute safety issues to make her a candidate for inpatient hospitalization. However, her depression and anxiety are severe; interferes with functioning at home/work and warrants a team-based approach with PCP, psychiatry, and social work/therapy - completed short-term disability paperwork for [employer] for 10/10-10/25. Plan  to fax this note and forms today to [phone]  Patient education and anticipatory guidance given Patient agrees with treatment plan Follow-up in 4 weeks with PCP or sooner as needed if symptoms worsen or fail to improve  Levonne Hubert PA-C

## 2017-05-20 NOTE — Patient Instructions (Signed)

## 2017-05-26 ENCOUNTER — Other Ambulatory Visit (HOSPITAL_COMMUNITY): Payer: PRIVATE HEALTH INSURANCE | Attending: Psychiatry | Admitting: Psychiatry

## 2017-05-26 ENCOUNTER — Encounter (HOSPITAL_COMMUNITY): Payer: Self-pay | Admitting: Psychiatry

## 2017-05-26 DIAGNOSIS — F322 Major depressive disorder, single episode, severe without psychotic features: Secondary | ICD-10-CM | POA: Insufficient documentation

## 2017-05-26 DIAGNOSIS — N182 Chronic kidney disease, stage 2 (mild): Secondary | ICD-10-CM | POA: Diagnosis not present

## 2017-05-26 DIAGNOSIS — E1122 Type 2 diabetes mellitus with diabetic chronic kidney disease: Secondary | ICD-10-CM | POA: Insufficient documentation

## 2017-05-26 DIAGNOSIS — Z79899 Other long term (current) drug therapy: Secondary | ICD-10-CM | POA: Diagnosis not present

## 2017-05-26 DIAGNOSIS — F17210 Nicotine dependence, cigarettes, uncomplicated: Secondary | ICD-10-CM | POA: Insufficient documentation

## 2017-05-26 DIAGNOSIS — E7800 Pure hypercholesterolemia, unspecified: Secondary | ICD-10-CM | POA: Diagnosis not present

## 2017-05-26 DIAGNOSIS — I1310 Hypertensive heart and chronic kidney disease without heart failure, with stage 1 through stage 4 chronic kidney disease, or unspecified chronic kidney disease: Secondary | ICD-10-CM | POA: Insufficient documentation

## 2017-05-26 DIAGNOSIS — F329 Major depressive disorder, single episode, unspecified: Secondary | ICD-10-CM | POA: Insufficient documentation

## 2017-05-26 DIAGNOSIS — Z794 Long term (current) use of insulin: Secondary | ICD-10-CM | POA: Insufficient documentation

## 2017-05-26 NOTE — Progress Notes (Deleted)
Stormy CardYolanda E Vialpando is a 51 y.o. female patient ***.        Chestine SporeLARK, [name]

## 2017-05-26 NOTE — Progress Notes (Signed)
Comprehensive Clinical Assessment (CCA) Note  05/26/2017 Jasmine [name] [id number]  Visit Diagnosis:      ICD-10-CM   1. Severe major depression, single episode, without psychotic features (HCC) F32.2       CCA Part One  Part One has been completed on paper by the patient.  (See scanned document in Chart Review)  CCA Part Two A  Intake/Chief Complaint:  CCA Intake With Chief Complaint CCA Part Two Date: 05/26/17 CCA Part Two Time: 1426 Chief Complaint/Presenting Problem: This is a 51 yr old, married, employed, [place] American female, who was referred per therapist Rolly Salter, [facility]) treatment for worsening depressive and anxiety symptoms.  Denies SI/HI or A/V hallucinations.  Hx of depression, sixteen yrs ago whenever her father passed.  Denies any past psychiatric hospitalizations or suicide attempts or gestures.  States she has seen Rolly Salter, [facility] once.  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  According to pt, things has become worse since getting a Writer in September 2018.  "In September I was written up twice in one day.  I've been called into the office four times since September."  Pt states that her manager is a bully.  2)  Medical:  Pt has Diabetes (Dx three yrs ago).  Pt was out of work in [place] for six weeks d/t Diabetes.  3)  Husband of 19 yrs:  Pt suspects that her husband has relapsed on drugs d/t not having extra money.  4)  Unresolved grief/loss issues:  Father died ~ 16 yrs ago.  Pt states she was very close to hr father.  Nephew died in 2017/03/05.Family Hx:  (Brother) Bipolar D/O, ETOH and drugs.                                                                                 Patients Currently Reported Symptoms/Problems: Reports symptoms became of concern in 2023/03/06.  Stays in bed a lot.  Crying spells every day.  She can't sleep.  Gets caught up in her worries.  I can't focus.  "I do good if I can stay focused for 10 minutes."   Craving sweets.  Has periods when she can't sit still and others when she feels slowed down.   Reports she doesn't have anybody to talk to about how she is feeling.  Worries excessively.  Ruminates about bad things that could happen.  Has panic attacks about every other day. I start shaking.  I feel like my heart is beating out of my chest.  Sometimes I get tingling.  Get shortness of breath.  Cries.   Hasn't been driving lately because she was having road rage.   Collateral Involvement: States she has one sister who is very supportive.  Currently here visiting with pt. Individual's Strengths: Usually she likes to talk to people and encourage them.   Describes herself as a good listener, dependable, and a motivator.      Has a supportive older sister.  She is very helpful.  Lives in [place]  She also belongs to a church community. Individual's Preferences: Wants to learn coping skills to manage her symptoms so she  can function more effectively, especially at work Type of Services Patient Feels Are Needed: MH-IOP Initial Clinical Notes/Concerns: No previous MH treatment  Has had previous episodes of depression.    Mental Health Symptoms Depression:  Depression: Worthlessness, Weight gain/loss, Sleep (too much or little), Irritability, Increase/decrease in appetite, Hopelessness, Fatigue, Difficulty Concentrating, Change in energy/activity  Mania:  Mania: N/A  Anxiety:   Anxiety: Difficulty concentrating, Fatigue, Irritability, Restlessness, Sleep, Tension, Worrying  Psychosis:  Psychosis: N/A  Trauma:  Trauma: N/A  Obsessions:  Obsessions: Recurrent & persistent thoughts/impulses/images  Compulsions:  Compulsions: N/A  Inattention:  Inattention: Fails to pay attention/makes careless mistakes, Forgetful, Poor follow-through on tasks  Hyperactivity/Impulsivity:  Hyperactivity/Impulsivity: N/A  Oppositional/Defiant Behaviors:  Oppositional/Defiant Behaviors: N/A  Borderline Personality:   Emotional Irregularity: N/A  Other Mood/Personality Symptoms:      Mental Status Exam Appearance and self-care  Stature:  Stature: Average  Weight:  Weight: Obese  Clothing:  Clothing: Casual  Grooming:  Grooming: Normal  Cosmetic use:  Cosmetic Use: None  Posture/gait:  Posture/Gait: Normal  Motor activity:  Motor Activity: Not Remarkable  Sensorium  Attention:  Attention: Normal  Concentration:  Concentration: Normal  Orientation:  Orientation: X5  Recall/memory:  Recall/Memory: Normal  Affect and Mood  Affect:  Affect: Anxious  Mood:  Mood: Anxious, Depressed  Relating  Eye contact:  Eye Contact: Normal  Facial expression:  Facial Expression: Sad, Responsive  Attitude toward examiner:  Attitude Toward Examiner: Cooperative  Thought and Language  Speech flow: Speech Flow: Normal  Thought content:  Thought Content: Appropriate to mood and circumstances  Preoccupation:  Preoccupations: Ruminations  Hallucinations:     Organization:     Company secretary of Knowledge:  Fund of Knowledge: Average  Intelligence:  Intelligence: Average  Abstraction:  Abstraction: Normal  Judgement:  Judgement: Normal  Reality Testing:  Reality Testing: Adequate  Insight:  Insight: Gaps  Decision Making:  Decision Making: Paralyzed  Social Functioning  Social Maturity:  Social Maturity: Responsible  Social Judgement:  Social Judgement: Normal  Stress  Stressors:  Stressors: Family conflict, Work, Illness, Arts administrator, Grief/losses  Coping Ability:  Coping Ability: Overwhelmed, Horticulturist, commercial Deficits:     Supports:      Family and Psychosocial History: Family history Marital status: Married Number of Years Married: 19 What types of issues is patient dealing with in the relationship?: "I'm not happy in my marriage."  Describes him as selfish and spoiled.  He uses drugs.  She believes it is crack cocaine.  Thinks he has been using on and off for 3 years. Additional relationship  information: He is out of town a lot for work.   Are you sexually active?: No Has your sexual activity been affected by drugs, alcohol, medication, or emotional stress?: He has cheated on her before.  She is afraid of getting an STD.   How many children?: 1 How is patient's relationship with their children?: Daughter, [name] (25) "We're close.  She is very private though"  Currently works and is taking Special educational needs teacher.  Her biological father resides in [place].  Not supportive.  Childhood History:  Childhood History By whom was/is the patient raised?: Mother Additional childhood history information: Grew up in [place].  Parents separated when she was about 63 years old.  Dad lived in [place].  He would visit about 5 times a year.   Description of patient's relationship with caregiver when they were a child: Relationship with dad was good.  They talked daily.  Mom "I admired her.  We were close.  I always slept with her."   Patient's description of current relationship with people who raised him/her: Dad died in 12-25-00.  He was murdered.  It was the day before her birthday.  Killer has not been found, although she has a good idea of who did it.  "He was the one I would always talk to."   Mom lives in [place].  "[place] still close.  We communicate every day."   How were you disciplined when you got in trouble as a child/adolescent?: She was pretty well behaved.  Didn't need a lot of discipline Does patient have siblings?: Yes Number of Siblings: 4 Description of patient's current relationship with siblings: 2 sisters and 2 brothers  Patient is the youngest.  Oldest sister lives in the area.  Their relationship is distant.  Close with her other sister.  One brother lives in [place].  They talk a couple times a month.  Her other brother has bipolar disorder and has history of drug addiction.  Lives in [place].  Calls patient a lot to ask for things. 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  Not able to concentrate on the calls.   Has patient ever been in the [employer]?: No Has patient ever served in combat?: No Did You Receive Any Psychiatric Treatment/Services While in the [employer]?: No Are There Guns or Other Weapons in Your Home?: Yes Are These Weapons Safely Secured?: Yes  Education: Education Did Garment/textile technologist From [employer]?: Yes Did You Attend College?: Yes What Type of College Degree Do you Have?: Did not graduate What Was Your Major?: Early Childhood Did You Have Any Difficulty At School?: No  Religion: Religion/Spirituality Are You A Religious Person?: Yes What is Your Religious Affiliation?: Non-Denominational  Leisure/Recreation: Leisure / Recreation Leisure and Hobbies: Lately she spends a lot of time in her room watching TV  When not depressed she likes to go to the movies, walk around the mall, read  Exercise/Diet: Exercise/Diet Do You Exercise?: No Do You Have Any Trouble Sleeping?: Yes Explanation of Sleeping Difficulties: Not able to stay asleep  CCA Part Two C  Alcohol/Drug Use: Alcohol / Drug Use History of alcohol / drug use?: No history of alcohol / drug abuse                       CCA Part Three  ASAM's:  Six Dimensions of Multidimensional Assessment  Dimension 1:  Acute Intoxication and/or Withdrawal Potential:     Dimension 2:  Biomedical Conditions and Complications:     Dimension 3:  Emotional, Behavioral, or Cognitive Conditions and Complications:     Dimension 4:  Readiness to Change:     Dimension 5:  Relapse,  Continued use, or Continued Problem Potential:     Dimension 6:  Recovery/Living Environment:      Substance use Disorder (SUD)    Social Function:  Social Functioning Social Maturity: Responsible Social Judgement: Normal  Stress:  Stress Stressors: Family conflict, Work, Illness, Arts administratorMoney, Grief/losses Coping Ability: Overwhelmed, Exhausted Patient Takes Medications The Way The Doctor Instructed?: Yes Priority Risk: Moderate Risk  Risk Assessment- Self-Harm Potential: Risk Assessment For Self-Harm Potential Thoughts of Self-Harm: No current thoughts Method: No plan Availability of Means: No access/NA Additional Comments for Self-Harm Potential: Denies history of harm to self  Risk Assessment -Dangerous to Others Potential: Risk Assessment For Dangerous to Others Potential Method: No Plan Availability of Means: No access or NA Intent: Vague intent or NA Additional Comments for Danger to Others Potential: Denies history of harm to others  DSM5 Diagnoses: Patient Active Problem List   Diagnosis Date Noted  . Severe major depression, single episode, without psychotic features (HCC) 05/26/2017    Class: Acute  . Severe episode of recurrent major depressive disorder, without psychotic features (HCC) 05/19/2017  . Generalized anxiety disorder 05/19/2017  . Menopause 05/13/2017  . Morbid obesity (HCC) 01/25/2017  . 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 Frequent headaches 01/05/2017  . Vitamin D deficiency  11/27/2016  . B12 deficiency 11/27/2016  . H. pylori infection 08/20/2016  . Tobacco use 08/13/2016  . Micturition syncope 08/13/2016  . Hyperlipidemia associated with type 2 diabetes mellitus (HCC) 06/17/2016  . Anxiety state 06/17/2016  . Diabetic retinopathy (HCC) 03/12/2016  . Essential hypertension, benign 01/09/2015  . Ventral hernia, recurrent 03/28/2014  . Umbilical hernia without obstruction and without gangrene 02/02/2014  . Chronic renal insufficiency, stage II (mild) 11/28/2013  . Type 2 diabetes mellitus (HCC) 11/27/2013  . Hyperlipidemia 11/27/2013  . Primary osteoarthritis of both knees 11/27/2013  . Morbid obesity with BMI of 50.0-59.9, adult (HCC) 06/03/2012  . Depression 02/09/2010  . ALLERGIC RHINITIS 02/09/2010    Patient Centered Plan: Patient is on the following Treatment Plan(s):  Anxiety and Depression  Recommendations for Services/Supports/Treatments: Recommendations for Services/Supports/Treatments Recommendations For Services/Supports/Treatments: IOP (Intensive Outpatient Program)  Treatment Plan Summary:  Oriented pt to MH-IOP.  Provided pt with an orientation folder.  Informed Dominic PeaSarah Solomon, [facility] of [facility].  Encouraged support groups.  Will refer pt to psychiatrist.  Referrals to Alternative Service(s): Referred to Alternative Service(s):   Place:   Date:   Time:    Referred to Alternative Service(s):   Place:   Date:   Time:    Referred to Alternative Service(s):   Place:   Date:   Time:    Referred to Alternative Service(s):   Place:   Date:   Time:     Dariah Mcsorley, [name], M.Ed, CNA
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
## 2017-05-27 ENCOUNTER — Other Ambulatory Visit (HOSPITAL_COMMUNITY): Payer: PRIVATE HEALTH INSURANCE | Admitting: Psychiatry

## 2017-05-27 DIAGNOSIS — F322 Major depressive disorder, single episode, severe without psychotic features: Secondary | ICD-10-CM

## 2017-05-27 DIAGNOSIS — F329 Major depressive disorder, single episode, unspecified: Secondary | ICD-10-CM | POA: Diagnosis not present

## 2017-05-27 NOTE — Progress Notes (Signed)
    Daily Group Progress Note  Program: IOP  Group Time: 9:00-12:00  Participation Level: Active  Behavioral Response: Appropriate  Type of Therapy:  Group Therapy  Summary of Progress: Pt. Presents as calm, talkative, engaged in the group process. Pt. Offered thoughtful feedback to other group members about finding herself and making peace with herself at midlife. Pt. Participated in guided meditation, breathwork, and bodyscan with the counselor. Pt. Participated in yoga therapy with Forde RadonLeanne Yates, LPC.       [name], Jennifer B, LPC

## 2017-05-28 ENCOUNTER — Other Ambulatory Visit: Payer: Self-pay | Admitting: Physician Assistant

## 2017-05-28 ENCOUNTER — Other Ambulatory Visit (HOSPITAL_COMMUNITY): Payer: PRIVATE HEALTH INSURANCE | Admitting: Psychiatry

## 2017-05-28 DIAGNOSIS — Z794 Long term (current) use of insulin: Principal | ICD-10-CM

## 2017-05-28 DIAGNOSIS — F329 Major depressive disorder, single episode, unspecified: Secondary | ICD-10-CM | POA: Diagnosis not present

## 2017-05-28 DIAGNOSIS — E1122 Type 2 diabetes mellitus with diabetic chronic kidney disease: Secondary | ICD-10-CM

## 2017-05-28 DIAGNOSIS — F322 Major depressive disorder, single episode, severe without psychotic features: Secondary | ICD-10-CM

## 2017-05-31 ENCOUNTER — Other Ambulatory Visit (HOSPITAL_COMMUNITY): Payer: PRIVATE HEALTH INSURANCE | Admitting: Psychiatry

## 2017-05-31 DIAGNOSIS — F322 Major depressive disorder, single episode, severe without psychotic features: Secondary | ICD-10-CM

## 2017-05-31 DIAGNOSIS — F329 Major depressive disorder, single episode, unspecified: Secondary | ICD-10-CM | POA: Diagnosis not present
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
## 2017-05-31 NOTE — Progress Notes (Signed)
    Daily Group Progress Note  Program: IOP  Group Time: 9:00-12:00   Participation Level: Active   Behavioral Response: Appropriate   Type of Therapy:  Group Therapy   Summary of Progress: Pt presented as engaged and quiet.  Pt said that she has a pattern of caring for others and wants to be able to accept care from others.  Pt participated in activity about forgiveness and chose quotes that particularly stood out to her.  Pt said she feels like she was able to forgive the person who killed her father 16 years ago, by the power of God.  Group members and counselor affirmed pt's compassion and forgiveness.  Pt participated in guided meditation.  [name], Jennifer B, LPC

## 2017-06-01 ENCOUNTER — Other Ambulatory Visit (HOSPITAL_COMMUNITY): Payer: PRIVATE HEALTH INSURANCE | Admitting: Psychiatry

## 2017-06-01 DIAGNOSIS — F322 Major depressive disorder, single episode, severe without psychotic features: Secondary | ICD-10-CM

## 2017-06-01 DIAGNOSIS — F329 Major depressive disorder, single episode, unspecified: Secondary | ICD-10-CM | POA: Diagnosis not present

## 2017-06-02 ENCOUNTER — Other Ambulatory Visit (HOSPITAL_COMMUNITY): Payer: PRIVATE HEALTH INSURANCE

## 2017-06-03 ENCOUNTER — Encounter (HOSPITAL_COMMUNITY): Payer: Self-pay | Admitting: Psychiatry

## 2017-06-03 ENCOUNTER — Ambulatory Visit (INDEPENDENT_AMBULATORY_CARE_PROVIDER_SITE_OTHER): Payer: 59 | Admitting: Psychiatry

## 2017-06-03 ENCOUNTER — Other Ambulatory Visit (HOSPITAL_COMMUNITY): Payer: PRIVATE HEALTH INSURANCE | Admitting: Psychiatry

## 2017-06-03 VITALS — HR 75 | Ht 62.0 in | Wt 263.0 lb

## 2017-06-03 DIAGNOSIS — R635 Abnormal weight gain: Secondary | ICD-10-CM

## 2017-06-03 DIAGNOSIS — F17210 Nicotine dependence, cigarettes, uncomplicated: Secondary | ICD-10-CM

## 2017-06-03 DIAGNOSIS — R4582 Worries: Secondary | ICD-10-CM

## 2017-06-03 DIAGNOSIS — F411 Generalized anxiety disorder: Secondary | ICD-10-CM

## 2017-06-03 DIAGNOSIS — Z811 Family history of alcohol abuse and dependence: Secondary | ICD-10-CM

## 2017-06-03 DIAGNOSIS — Z634 Disappearance and death of family member: Secondary | ICD-10-CM

## 2017-06-03 DIAGNOSIS — F4310 Post-traumatic stress disorder, unspecified: Secondary | ICD-10-CM

## 2017-06-03 DIAGNOSIS — F322 Major depressive disorder, single episode, severe without psychotic features: Secondary | ICD-10-CM

## 2017-06-03 DIAGNOSIS — Z818 Family history of other mental and behavioral disorders: Secondary | ICD-10-CM

## 2017-06-03 DIAGNOSIS — Z813 Family history of other psychoactive substance abuse and dependence: Secondary | ICD-10-CM
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
## 2017-06-04 ENCOUNTER — Other Ambulatory Visit (HOSPITAL_COMMUNITY): Payer: PRIVATE HEALTH INSURANCE

## 2017-06-04 NOTE — Progress Notes (Signed)
    Daily Group Progress Note  Program: IOP  Group Time: 9:00-12:00   Participation Level: Active   Behavioral Response: Appropriate   Type of Therapy:  Group Therapy   Summary of Progress: Pt presented as engaged.  Pt heard and participated in medication management presentation from pharmacist.  Pt said that she feels like she is running on empty in her workplace.  Counselor suggested that pt may look into self-care strategies that can fuel her for the more difficult situations.  Pt related to other group members and expressed empathy.  Counselor led group in brief grounding exercise.  [name], Jennifer B, LPC
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
## 2017-06-07 ENCOUNTER — Other Ambulatory Visit (HOSPITAL_COMMUNITY): Payer: PRIVATE HEALTH INSURANCE | Admitting: Psychiatry

## 2017-06-07 ENCOUNTER — Ambulatory Visit: Payer: 59 | Admitting: Endocrinology

## 2017-06-07 DIAGNOSIS — F322 Major depressive disorder, single episode, severe without psychotic features: Secondary | ICD-10-CM

## 2017-06-07 DIAGNOSIS — F329 Major depressive disorder, single episode, unspecified: Secondary | ICD-10-CM | POA: Diagnosis not present

## 2017-06-08 ENCOUNTER — Other Ambulatory Visit (HOSPITAL_COMMUNITY): Payer: PRIVATE HEALTH INSURANCE | Admitting: Psychiatry

## 2017-06-08 DIAGNOSIS — F329 Major depressive disorder, single episode, unspecified: Secondary | ICD-10-CM | POA: Diagnosis not present

## 2017-06-08 DIAGNOSIS — F322 Major depressive disorder, single episode, severe without psychotic features: Secondary | ICD-10-CM

## 2017-06-09 ENCOUNTER — Telehealth (HOSPITAL_COMMUNITY): Payer: Self-pay | Admitting: *Deleted

## 2017-06-09 ENCOUNTER — Other Ambulatory Visit (HOSPITAL_COMMUNITY): Payer: PRIVATE HEALTH INSURANCE | Admitting: Psychiatry

## 2017-06-09 DIAGNOSIS — F329 Major depressive disorder, single episode, unspecified: Secondary | ICD-10-CM | POA: Diagnosis not present

## 2017-06-09 DIAGNOSIS — F322 Major depressive disorder, single episode, severe without psychotic features: Secondary | ICD-10-CM

## 2017-06-09 NOTE — Progress Notes (Signed)
    Daily Group Progress Note  Program: IOP  Group Time: 9:00-12:00   Participation Level: Active   Behavioral Response: Appropriate   Type of Therapy:  Group Therapy   Summary of Progress: Pt presented as engaged.  Pt listened to and participated in discussion of medications with the pharmacist.  Pt supported and affirmed other group members in their relationship work.  Pt asked if she may be having panic attacks, as she sometimes experiences anxiety in her body.  Counselor provided brief psychoeducation around panic attacks.  Brier Firebaugh B, LPC 
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
## 2017-06-10 ENCOUNTER — Other Ambulatory Visit (HOSPITAL_COMMUNITY): Payer: PRIVATE HEALTH INSURANCE | Attending: Psychiatry | Admitting: Psychiatry

## 2017-06-10 DIAGNOSIS — F332 Major depressive disorder, recurrent severe without psychotic features: Secondary | ICD-10-CM | POA: Diagnosis not present

## 2017-06-10 DIAGNOSIS — F419 Anxiety disorder, unspecified: Secondary | ICD-10-CM | POA: Insufficient documentation

## 2017-06-10 DIAGNOSIS — F322 Major depressive disorder, single episode, severe without psychotic features: Secondary | ICD-10-CM

## 2017-06-10 NOTE — Progress Notes (Signed)
    Daily Group Progress Note  Program: IOP  Group Time: 9:00-12:00  Participation Level: Active  Behavioral Response: Appropriate  Type of Therapy:  Group Therapy  Summary of Progress: Pt. Reported that she did not sleep well. Pt. Attributed difficulty sleeping to thoughts about her deceased father and missing him. Pt. Discussed feeling hopeful about being returning to work and being able to transition in the future. Pt. Discussed feeling forgetful and problems with shortterm memory. Counselor discussed connection of problems with short-term memory and insomnia and depression. Pt. Participated in yoga therapy with Forde RadonLeanne Yates, LPC.     Jasmine [name], Jasmine [name], LPC

## 2017-06-10 NOTE — Telephone Encounter (Signed)
I am ok with transfer, considering her residence there

## 2017-06-11 ENCOUNTER — Other Ambulatory Visit (HOSPITAL_COMMUNITY): Payer: PRIVATE HEALTH INSURANCE | Admitting: Psychiatry

## 2017-06-11 DIAGNOSIS — F332 Major depressive disorder, recurrent severe without psychotic features: Secondary | ICD-10-CM | POA: Diagnosis not present

## 2017-06-11 DIAGNOSIS — F322 Major depressive disorder, single episode, severe without psychotic features: Secondary | ICD-10-CM

## 2017-06-11 NOTE — Progress Notes (Signed)
    Daily Group Progress Note  Program: IOP  Group Time: 9:00-12:00  Participation Level: Active  Behavioral Response: Appropriate  Type of Therapy:  Group Therapy  Summary of Progress: Pt. Presents with bright affect, talkative, engaged in the group process. Pt. Discussed her diabetes and how it has affected her depression. Pt. Provided to support to other Pt. Regarding to anger management and setting healthy boundaries as he tries to build relationship with his wife. Pt. Participated in guided meditation and breathing exercise for grounding and relaxation.     [name], Jennifer B, LPC

## 2017-06-14 ENCOUNTER — Other Ambulatory Visit (HOSPITAL_COMMUNITY): Payer: PRIVATE HEALTH INSURANCE | Admitting: Psychiatry

## 2017-06-14 DIAGNOSIS — F322 Major depressive disorder, single episode, severe without psychotic features: Secondary | ICD-10-CM

## 2017-06-14 DIAGNOSIS — F332 Major depressive disorder, recurrent severe without psychotic features: Secondary | ICD-10-CM | POA: Diagnosis not present

## 2017-06-15 ENCOUNTER — Other Ambulatory Visit (HOSPITAL_COMMUNITY): Payer: PRIVATE HEALTH INSURANCE | Admitting: Psychiatry

## 2017-06-15 DIAGNOSIS — F332 Major depressive disorder, recurrent severe without psychotic features: Secondary | ICD-10-CM | POA: Diagnosis not present

## 2017-06-15 DIAGNOSIS — F322 Major depressive disorder, single episode, severe without psychotic features: Secondary | ICD-10-CM

## 2017-06-15 NOTE — Progress Notes (Signed)
    Daily Group Progress Note  Program: IOP  Group Time: 9:00-12:00   Participation Level: Active   Behavioral Response: Appropriate   Type of Therapy:  Group Therapy   Summary of Progress: Pt presented as engaged.  Pt participated in drawing check-in activity, saying that she was feeling heavy.  Pt said the service of her friend who passed away was occurring in [place] today and she could not attend.  Pt left group early, saying that she was tired and wanted to drive home before she started feeling more tired.  [name], Jennifer B, LPC

## 2017-06-16 ENCOUNTER — Other Ambulatory Visit (HOSPITAL_COMMUNITY): Payer: PRIVATE HEALTH INSURANCE | Admitting: Psychiatry

## 2017-06-16 DIAGNOSIS — F332 Major depressive disorder, recurrent severe without psychotic features: Secondary | ICD-10-CM | POA: Diagnosis not present

## 2017-06-16 DIAGNOSIS — F322 Major depressive disorder, single episode, severe without psychotic features: Secondary | ICD-10-CM

## 2017-06-16 NOTE — Progress Notes (Signed)
    Daily Group Progress Note  Program: IOP  Group Time: 9:00-12:00   Participation Level: Active   Behavioral Response: Appropriate   Type of Therapy:  Group Therapy   Summary of Progress: Pt presented as engaged.  Pt said she only slept one hour the previous night.  Pt said she has noticed a lack of motivation in herself lately and that sleep disturbance is normal for her, though not to that extent.  Pt provided thoughtful feedback to other group members surrounding interpersonal issues.  Pt participated in psychoeducation about physical, spiritual, emotional and mental health.  [name], Jennifer B, LPC

## 2017-06-16 NOTE — Progress Notes (Signed)
    Daily Group Progress Note  Program: IOP Group Time: 9:00-12:00   Participation Level: Active   Behavioral Response: Appropriate   Type of Therapy:  Group Therapy   Summary of Progress: Pt presented as engaged.  Pt said she has some OCD-like behaviors, for example, in the way that she stockpiles her dish soap.  Pt expressed that she feels burdened by providing for all of her family members financially and emotionally.  Counselors and pt engaged in discussion about forming healthy boundaries. Counselor pointed out that pt seems to want support and yet also does not receive it well when others try to provide it.  Pt and counselor agreed that perhaps some of pt's work is around letting others take care of her and/or trusting others to voice to her what they need.  [name], Jennifer B, LPC

## 2017-06-17 ENCOUNTER — Other Ambulatory Visit (HOSPITAL_COMMUNITY): Payer: PRIVATE HEALTH INSURANCE | Admitting: Psychiatry

## 2017-06-17 DIAGNOSIS — F322 Major depressive disorder, single episode, severe without psychotic features: Secondary | ICD-10-CM

## 2017-06-17 DIAGNOSIS — F332 Major depressive disorder, recurrent severe without psychotic features: Secondary | ICD-10-CM | POA: Diagnosis not present

## 2017-06-17 NOTE — Progress Notes (Signed)
Patient ID: Jasmine [name], female   DOB: 08-16-1965, 51 y.o.   MRN: [id number]  BH IOP DISCHARGE NOTE  Patient:  Jasmine [name] DOB:  08-16-1965  Date of Admission:05/26/2017   Date of Discharge:06/17/2017   Reason for Admission:depression  IOP Course:attended and participated.  Made good progress in the group but felt that when she left she had made no gains she could keep/  Anxiety about returning to work remains the major issue.  Just cannot do it yet she said.    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  The patient received suicide prevention pamphlet:  Yes   Carolanne GrumblingGerald Taylor, MD

## 2017-06-17 NOTE — Progress Notes (Signed)
Jasmine [name] is a 51 y.o. , married, employed, [place], who was referred per therapist Rolly Salter(Sara Solomon, [facility]) treatment for worsening depressive and anxiety symptoms.  Denies SI/HI or A/V hallucinations still.  Hx of depression, sixteen yrs ago whenever her father passed.  Denied any past psychiatric hospitalizations or suicide attempts or gestures.  Stated she has seen [facility], [facility].  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  According to pt, things has become worse since getting a Writernew manager in September 2018.  "In September I was written up twice in one day.  I've been called into the office four times since September."  Pt states that her manager is a bully.  Pt states she feels that she is not ready to return to work.  "I heard that a lot of people just got fired recently.  Pt states she has been looking at other job options on [employer] Website.  2)  Medical:  Pt has Diabetes (Dx three yrs ago).  [place]/t Diabetes.  States she is stable at this time.  3)  Husband of 19 yrs:  Pt had been suspecting that her husband had relapsed on drugs d/t not having extra money.  States that he admitted that he used once and he doesn't plan to use again.  Pt encouraged him to seek therapy.  4)  Unresolved grief/loss issues:  Father died ~ 16 yrs ago.  Pt states she was very close to hr father.  Nephew died in July 2018.Family Hx:  (Brother) Bipolar D/O, ETOH and drugs. Pt will be discharged today.  Continues to struggle with depressive and anxiety symptoms daily.  Pt c/o poor appetite and sleep.  "I don't have any motivation to do anything."  Pt scored 40 on the Depression Checklist upon admission and on discharge.  A:  D/C today.  F/U with Dr. Lolly MustacheArfeen on 07-07-17 @ 7 a.m. (Dr. Gilmore LarocheAkhtar gave permission for pt to switch d/t residence) and R[facility]Sara Solomon, [facility] 06-23-17.  Strongly encouraged support groups and [facility] [place].  RTW  on 06-28-17; without any restrictions.  R:  Pt receptive.                                                               Chestine SporeLARK, [name], M.Ed, CNA

## 2017-06-17 NOTE — Patient Instructions (Signed)
D:  Pt successfully completed MH-IOP today.  A:  Discharge today.  Follow up with Dr. Lolly MustacheArfeen on 07-07-17 @ 7 a.m and Rolly SalterSara Solomon, [facility] 06-23-17.  Encouraged support groups.  Return to work on 06-28-17; without any restrictions.  R:  Pt receptive.

## 2017-06-17 NOTE — Progress Notes (Signed)
    Daily Group Progress Note  Program: IOP  Group Time: 9:00-12:00   Participation Level: Active   Behavioral Response: Appropriate; Motivated   Type of Therapy:  Group Therapy   Summary of Progress: Pt presented as engaged.  Pt provided thoughtful feedback to other group members.  Pt said after group the previous day, she decided to have a conversation with her mother about how she has been feeling.  Counselors affirmed pt's courage and vulnerability.  Pt said she also had an assertive conversation with her sister that morning.  Pt said she still struggles to not feel selfish when she practices self-care.  Pt participated in self-care discussion with Chaplain.  [name], Vannah Nadal B, LPC

## 2017-06-18 ENCOUNTER — Other Ambulatory Visit (HOSPITAL_COMMUNITY): Payer: PRIVATE HEALTH INSURANCE

## 2017-06-18 NOTE — Progress Notes (Signed)
    Daily Group Progress Note  Program: IOP  Group Time: 9:00-12:00  Participation Level: Active  Behavioral Response: Appropriate  Type of Therapy:  Group Therapy  Summary of Progress: Pt. Presented as talkative, engaged in the group process. Pt. Met with the case manager and psychiatrist to prepare for discharge. Pt. Received positive feedback from the group regarding her contribution to the group process. Pt. discussed career planning, her strengths in customer services, and the importance of having a plan as she moves forward. Pt. Participated in discussion facilitated by the mental health association about the importance of community social supports.     Nancie Neas, LPC

## 2017-06-21 ENCOUNTER — Other Ambulatory Visit (HOSPITAL_COMMUNITY): Payer: PRIVATE HEALTH INSURANCE

## 2017-06-22 ENCOUNTER — Other Ambulatory Visit: Payer: Self-pay | Admitting: Physician Assistant

## 2017-06-22 ENCOUNTER — Other Ambulatory Visit (HOSPITAL_COMMUNITY): Payer: PRIVATE HEALTH INSURANCE

## 2017-06-23 ENCOUNTER — Ambulatory Visit (INDEPENDENT_AMBULATORY_CARE_PROVIDER_SITE_OTHER): Payer: PRIVATE HEALTH INSURANCE | Admitting: Licensed Clinical Social Worker

## 2017-06-23 ENCOUNTER — Other Ambulatory Visit (HOSPITAL_COMMUNITY): Payer: PRIVATE HEALTH INSURANCE

## 2017-06-23 ENCOUNTER — Other Ambulatory Visit: Payer: Self-pay | Admitting: Physician Assistant

## 2017-06-23 DIAGNOSIS — Z794 Long term (current) use of insulin: Principal | ICD-10-CM

## 2017-06-23 DIAGNOSIS — F332 Major depressive disorder, recurrent severe without psychotic features: Secondary | ICD-10-CM

## 2017-06-23 DIAGNOSIS — F411 Generalized anxiety disorder: Secondary | ICD-10-CM

## 2017-06-23 DIAGNOSIS — E1122 Type 2 diabetes mellitus with diabetic chronic kidney disease: Secondary | ICD-10-CM

## 2017-06-24 ENCOUNTER — Other Ambulatory Visit (HOSPITAL_COMMUNITY): Payer: PRIVATE HEALTH INSURANCE

## 2017-06-24 NOTE — Progress Notes (Signed)
   THERAPIST PROGRESS NOTE  Session Time: 2:25pm-3:10pm  Participation Level: Active  Behavioral Response: CasualAlertAnxious  Type of Therapy: Individual Therapy  Treatment Goals addressed: Reduce symptoms of depression and anxiety to improve daily functioning  Interventions: Assessment, treatment planning  Suicidal/Homicidal: Denied both  Therapist Interventions: Discussed patient's experience with MHIOP.  Had patient complete a PHQ9 and GAD7 to assess for changes in symptom severity of depression and anxiety.  Assessed readiness to return to work.   Reviewed and updated treatment plan.  Summary:  Symptoms of depression and anxiety remain at the severe level:  Depression screen [facility] 2/9 06/23/2017 05/20/2017 05/19/2017 10/20/2016  Decreased Interest 3 3 3  0  Down, Depressed, Hopeless 3 3 3 2   PHQ - 2 Score 6 6 6 2   Altered sleeping 3 3 3 2   Tired, decreased energy 3 3 3 3   Change in appetite 3 3 3 1   Feeling bad or failure about yourself  2 2 3 1   Trouble concentrating 3 3 3 1   Moving slowly or fidgety/restless 3 3 0 0  Suicidal thoughts 0 0 - 0  PHQ-9 Score 23 23 21 10   Difficult doing work/chores - Extremely dIfficult - -   GAD 7 : Generalized Anxiety Score 06/23/2017 05/20/2017 05/19/2017  Nervous, Anxious, on Edge 3 3 3   Control/stop worrying 3 3 3   Worry too much - different things 3 3 3   Trouble relaxing 3 3 3   Restless 3 3 3   Easily annoyed or irritable 3 3 3   Afraid - awful might happen 3 3 3   Total GAD 7 Score 21 21 21   Anxiety Difficulty Extremely difficult Extremely difficult Extremely difficult   Reported that she liked participating in MHIOP because it helped her to see she is not the only one dealing with difficult MH issues.  Expressed a belief that the interventions were not effective for reducing her symptoms.   Indicated she ruminates a lot about worries related to returning to work.  While her symptoms remain severe she does not feel confident about  being able to perform effectively at work.   She has a medication management appointment scheduled for 11/28 with Dr. Lolly MustacheArfeen.  Therapist encouraged her to see if she could be seen sooner.     Plan: Recommending weekly therapy sessions.  Diagnosis:  MDD, recurrent, severe without psychotic features                      GAD    Marilu FavreSolomon, Bettyjane Shenoy A, LCSW 06/23/2017

## 2017-06-25 ENCOUNTER — Other Ambulatory Visit (HOSPITAL_COMMUNITY): Payer: PRIVATE HEALTH INSURANCE

## 2017-06-28 ENCOUNTER — Encounter: Payer: Self-pay | Admitting: Women's Health

## 2017-06-28 ENCOUNTER — Ambulatory Visit (HOSPITAL_COMMUNITY): Payer: Self-pay | Admitting: Psychiatry

## 2017-06-28 ENCOUNTER — Telehealth (HOSPITAL_COMMUNITY): Payer: Self-pay

## 2017-06-28 NOTE — Telephone Encounter (Signed)
Patient called stating she wanted an update on the disability extension papers that Maralyn SagoSarah was working on. I explained to her that the papers were done last week but she wants Maralyn SagoSarah to call her and let her know exactly what she put down on the paper work as far as how many days she put down for her to be out of work.

## 2017-06-29 ENCOUNTER — Other Ambulatory Visit (HOSPITAL_COMMUNITY): Payer: PRIVATE HEALTH INSURANCE

## 2017-06-30 ENCOUNTER — Other Ambulatory Visit (HOSPITAL_COMMUNITY): Payer: PRIVATE HEALTH INSURANCE

## 2017-07-02 ENCOUNTER — Other Ambulatory Visit (HOSPITAL_COMMUNITY): Payer: PRIVATE HEALTH INSURANCE

## 2017-07-05 ENCOUNTER — Other Ambulatory Visit (HOSPITAL_COMMUNITY): Payer: PRIVATE HEALTH INSURANCE

## 2017-07-06 ENCOUNTER — Other Ambulatory Visit (HOSPITAL_COMMUNITY): Payer: PRIVATE HEALTH INSURANCE

## 2017-07-07 ENCOUNTER — Ambulatory Visit (HOSPITAL_COMMUNITY): Payer: Self-pay | Admitting: Psychiatry

## 2017-07-07 ENCOUNTER — Other Ambulatory Visit (HOSPITAL_COMMUNITY): Payer: PRIVATE HEALTH INSURANCE

## 2017-07-08 ENCOUNTER — Other Ambulatory Visit (HOSPITAL_COMMUNITY): Payer: PRIVATE HEALTH INSURANCE

## 2017-07-09 ENCOUNTER — Other Ambulatory Visit (HOSPITAL_COMMUNITY): Payer: PRIVATE HEALTH INSURANCE

## 2017-07-12 ENCOUNTER — Encounter: Payer: Self-pay | Admitting: Internal Medicine

## 2017-07-12 ENCOUNTER — Other Ambulatory Visit (HOSPITAL_COMMUNITY): Payer: 59

## 2017-07-12 ENCOUNTER — Ambulatory Visit (INDEPENDENT_AMBULATORY_CARE_PROVIDER_SITE_OTHER): Payer: 59 | Admitting: Internal Medicine

## 2017-07-12 VITALS — BP 132/84 | HR 83 | Wt 264.2 lb

## 2017-07-12 DIAGNOSIS — E1122 Type 2 diabetes mellitus with diabetic chronic kidney disease: Secondary | ICD-10-CM | POA: Diagnosis not present

## 2017-07-12 DIAGNOSIS — Z794 Long term (current) use of insulin: Secondary | ICD-10-CM

## 2017-07-12 DIAGNOSIS — N182 Chronic kidney disease, stage 2 (mild): Secondary | ICD-10-CM | POA: Diagnosis not present

## 2017-07-12 LAB — POCT GLYCOSYLATED HEMOGLOBIN (HGB A1C): HEMOGLOBIN A1C: 8.4

## 2017-07-12 MED ORDER — DAPAGLIFLOZIN PROPANEDIOL 10 MG PO TABS: 10.0000 mg | ORAL_TABLET | Freq: Every day | ORAL | 1 refills | Status: DC

## 2017-07-12 MED ORDER — DULAGLUTIDE 1.5 MG/0.5ML ~~LOC~~ SOAJ: 1.5000 mg | SUBCUTANEOUS | 1 refills | Status: DC

## 2017-07-12 NOTE — Patient Instructions (Addendum)
Please increase: Evaristo Bury- Tresiba to 20 units  Please restart: - Farxiga 10 mg daily in am  Please continue: - Trulicity 1.5 mg weekly   Please let me know if the sugars are consistently <80 or >200.  Please return in 1.5 months with your sugar log.   PATIENT INSTRUCTIONS FOR TYPE 2 DIABETES:  DIET AND EXERCISE Diet and exercise is an important part of diabetic treatment.  We recommended aerobic exercise in the form of brisk walking (working between 40-60% of maximal aerobic capacity, similar to brisk walking) for 150 minutes per week (such as 30 minutes five days per week) along with 3 times per week performing 'resistance' training (using various gauge rubber tubes with handles) 5-10 exercises involving the major muscle groups (upper body, lower body and core) performing 10-15 repetitions (or near fatigue) each exercise. Start at half the above goal but build slowly to reach the above goals. If limited by weight, joint pain, or disability, we recommend daily walking in a swimming pool with water up to waist to reduce pressure from joints while allow for adequate exercise.    BLOOD GLUCOSES Monitoring your blood glucoses is important for continued management of your diabetes. Please check your blood glucoses 2-4 times a day: fasting, before meals and at bedtime (you can rotate these measurements - e.g. one day check before the 3 meals, the next day check before 2 of the meals and before bedtime, etc.).   HYPOGLYCEMIA (low blood sugar) Hypoglycemia is usually a reaction to not eating, exercising, or taking too much insulin/ other diabetes drugs.  Symptoms include tremors, sweating, hunger, confusion, headache, etc. Treat IMMEDIATELY with 15 grams of Carbs: . 4 glucose tablets .  cup regular juice/soda . 2 tablespoons raisins . 4 teaspoons sugar . 1 tablespoon honey Recheck blood glucose in 15 mins and repeat above if still symptomatic/blood glucose <100.  RECOMMENDATIONS TO REDUCE YOUR  RISK OF DIABETIC COMPLICATIONS: * Take your prescribed MEDICATION(S) * Follow a DIABETIC diet: Complex carbs, fiber rich foods, (monounsaturated and polyunsaturated) fats * AVOID saturated/trans fats, high fat foods, >2,300 mg salt per day. * EXERCISE at least 5 times a week for 30 minutes or preferably daily.  * DO NOT SMOKE OR DRINK more than 1 drink a day. * Check your FEET every day. Do not wear tightfitting shoes. Contact us if you develop an ulcer * See your EYE doctor once a year or more if needed * Get a FLU shot once a year * Get a PNEUMONIA vaccine once before and once after age 49 years  GOALS:  * Your Hemoglobin A1c of <7%  * fasting sugars need to be <130 * after meals sugars need to be <180 (2h after you start eating) * Your Systolic BP should be 140 or lower  * Your Diastolic BP should be 80 or lower  * Your HDL (Good Cholesterol) should be 40 or higher  * Your LDL (Bad Cholesterol) should be 100 or lower. * Your Triglycerides should be 150 or lower  * Your Urine microalbumin (kidney function) should be <30 * Your Body Mass Index should be 25 or lower    Please consider the following ways to cut down carbs and fat and increase fiber and micronutrients in your diet: - substitute whole grain for white bread or pasta - substitute brown rice for white rice - substitute 90-calorie flat bread pieces for slices of bread when possible - substitute sweet potatoes or yams for white potatoes - substitute  humus for margarine - substitute tofu for cheese when possible - substitute almond or rice milk for regular milk (would not drink soy milk daily due to concern for soy estrogen influence on breast cancer risk) - substitute dark chocolate for other sweets when possible - substitute water - can add lemon or orange slices for taste - for diet sodas (artificial sweeteners will trick your body that you can eat sweets without getting calories and will lead you to overeating and weight  gain in the long run) - do not skip breakfast or other meals (this will slow down the metabolism and will result in more weight gain over time)  - can try smoothies made from fruit and almond/rice milk in am instead of regular breakfast - can also try old-fashioned (not instant) oatmeal made with almond/rice milk in am - order the dressing on the side when eating salad at a restaurant (pour less than half of the dressing on the salad) - eat as little meat as possible - can try juicing, but should not forget that juicing will get rid of the fiber, so would alternate with eating raw veg./fruits or drinking smoothies - use as little oil as possible, even when using olive oil - can dress a salad with a mix of balsamic vinegar and lemon juice, for e.g. - use agave nectar, stevia sugar, or regular sugar rather than artificial sweateners - steam or broil/roast veggies  - snack on veggies/fruit/nuts (unsalted, preferably) when possible, rather than processed foods - reduce or eliminate aspartame in diet (it is in diet sodas, chewing gum, etc) Read the labels!  Try to read Dr. Katherina RightNeal Barnard's book: "Program for Reversing Diabetes" for other ideas for healthy eating.

## 2017-07-12 NOTE — Progress Notes (Signed)
Patient ID: Jasmine [name], female   DOB: [birth year], 51 y.o.   MRN: [id number]   HPI: Jasmine [name] is a 51 y.o.-year-old female, referred by her PCP, Jomarie Longs, PA-C, for management of DM2, dx in 2014, but had GDM in 1992, insulin-dependent, uncontrolled, with complications (CKD, DR),.  She has been seen previously by Dr. Everardo All, now would like to switch to see me.  Last hemoglobin A1c was: Lab Results  Component Value Date   HGBA1C 7.1 04/07/2017   HGBA1C 7.8 01/22/2017   HGBA1C 11.4 10/20/2016   Pt is on a regimen of: - Tresiba 18 units at night (increased from 10 to 18 units 3 weeks ago) - Trulicity 1.5 mg weekly - Sat. She stopped Farxiga 10 mg daily in am in 04/2017 >> repeated yeast inf's She was on Novolog 2 units 3x a day, before meals - started 03/2017, stopped 05/2017.  Pt checks her sugars 3x a day and they are: - am: 170s - 2h after b'fast: n/c - before lunch: n/c - 2h after lunch: n/c - before dinner: 170s - 2h after dinner: n/c - bedtime: 170's-180 - nighttime: n/c No lows. Lowest sugar was 80 (did not eat), OTW 132; she has hypoglycemia awareness 80  Highest sugar was 186.  Glucometer: FreeStyle  Pt's meals are: - Breakfast: coffee or PB sandwich + coffee - Lunch: chicken, veggies - Dinner: bowl of cereal or skips - Snacks: many: M and M, granola bar, trailmix Drinks sweet tea.  - + CKD, last BUN/creatinine:  Lab Results  Component Value Date   BUN 13 11/26/2016   BUN 11 08/19/2016   CREATININE 1.16 (H) 11/26/2016   CREATININE 1.16 (H) 08/19/2016  On losartan. - last set of lipids: Lab Results  Component Value Date   CHOL 198 06/16/2016   HDL 54 06/16/2016   LDLCALC 116 (H) 06/16/2016   TRIG 142 06/16/2016   CHOLHDL 3.7 06/16/2016  On Zocor. - last eye exam was in 02/2016. + DR.  - no numbness and tingling in her feet. L ankle and L hand numbness.  Pt has FH of DM in "everybody" - both sides of family: M, F, S's, B's,  aunts.  ROS: Constitutional: no weight gain/loss, no fatigue, no subjective hyperthermia/hypothermia Eyes: no blurry vision, no xerophthalmia ENT: no sore throat, no nodules palpated in throat, no dysphagia/odynophagia, no hoarseness Cardiovascular: no CP/SOB/palpitations/+leg swelling Respiratory: no cough/SOB Gastrointestinal: no N/V/D/C Musculoskeletal: no muscle/joint aches Skin: no rashes Neurological: no tremors/numbness/tingling/dizziness Psychiatric: no depression/anxiety  Past Medical History:  Diagnosis Date  . Anxiety   . Arthritis    "knees" (03/28/2014)  . Chronic renal insufficiency    stage II (mild)  . Depression   . Headache    with high blood sugar  . Hypercholesteremia   . Hypertension   . Numbness and tingling of right arm and leg   . Seasonal allergies   . Type II diabetes mellitus (HCC)   . Umbilical hernia without mention of obstruction or gangrene   . Wears glasses    Past Surgical History:  Procedure Laterality Date  . INSERTION OF MESH N/A 03/28/2014   Procedure: INSERTION OF MESH;  Surgeon: Axel Filler, MD;  Location: [facility] OR;  Service: General;  Laterality: N/A;  . INSERTION OF MESH N/A 05/08/2015   Procedure: INSERTION OF MESH;  Surgeon: Axel Filler, MD;  Location: [facility] OR;  Service: General;  Laterality: N/A;  . LAPAROSCOPIC LYSIS OF ADHESIONS N/A 05/08/2015   Procedure:  LAPAROSCOPIC LYSIS OF ADHESIONS;  Surgeon: Axel FillerArmando Ramirez, MD;  Location: [facility] OR;  Service: General;  Laterality: N/A;  . MULTIPLE TOOTH EXTRACTIONS  ~ 2003  . TONSILLECTOMY    . UMBILICAL HERNIA REPAIR  1973  . VENTRAL HERNIA REPAIR N/A 03/28/2014   Procedure: LAPAROSCOPIC VENTRAL HERNIA;  Surgeon: Axel FillerArmando Ramirez, MD;  Location: [facility] OR;  Service: General;  Laterality: N/A;  . VENTRAL HERNIA REPAIR N/A 05/08/2015   Procedure: LAPAROSCOPIC VENTRAL HERNIA REPAIR WITH MESH;  Surgeon: Axel FillerArmando Ramirez, MD;  Location: [facility] OR;  Service: General;  Laterality: N/A;   Social History    Socioeconomic History  . Marital status: Married    Spouse name: Not on file  . Number of children: Not on file  . Years of education: Not on file  . Highest education level: Not on file  Social Needs  . Financial resource strain: Not on file  . Food insecurity - worry: Not on file  . Food insecurity - inability: Not on file  . Transportation needs - medical: Not on file  . Transportation needs - non-medical: Not on file  Occupational History  . Not on file  Tobacco Use  . Smoking status: Current Some Day Smoker    Packs/day: 0.25    Years: 26.00    Pack years: 6.50    Types: Cigarettes  . Smokeless tobacco: Never Used  . Tobacco comment: down to 1 cigarette a week  Substance and Sexual Activity  . Alcohol use: No    Comment: 03/28/2014 "have a drink a few times/year"  . Drug use: No  . Sexual activity: Not Currently  Other Topics Concern  . Not on file  Social History Narrative  . Not on file   Current Outpatient Medications on File Prior to Visit  Medication Sig Dispense Refill  . buPROPion (WELLBUTRIN XL) 150 MG 24 hr tablet Take 1 tablet (150 mg total) by mouth daily. 30 tablet 3  . cetirizine (ZYRTEC) 10 MG tablet Take 10 mg by mouth daily as needed for allergies.    . Dulaglutide (TRULICITY) 1.5 MG/0.5ML SOPN Inject 1.5 mg into the skin once a week. 12 pen 0  . fluticasone (FLONASE) 50 MCG/ACT nasal spray Place 2 sprays into both nostrils daily. 16 g 5  . glucose blood test strip One Touch Ultra meter, test strips & lancets . Check blood sugar 3 times daily. Diagnosis - Diabetes ICD 10 - E 11.22 100 each 11  . losartan-hydrochlorothiazide (HYZAAR) 50-12.5 MG tablet Take 1 tablet by mouth daily. 90 tablet 3  . omeprazole (PRILOSEC) 40 MG capsule Take 1 capsule (40 mg total) by mouth daily. 30 capsule 3  . sertraline (ZOLOFT) 100 MG tablet Take one and one/half tablet daily. 135 tablet 0  . simvastatin (ZOCOR) 10 MG tablet Take 1 tablet (10 mg total) daily by mouth.  Need lab work for future refills. 30 tablet 0  . traMADol (ULTRAM) 50 MG tablet Take 1 tablet (50 mg total) by mouth every 8 (eight) hours as needed. 30 tablet 0  . TRESIBA FLEXTOUCH 100 UNIT/ML SOPN FlexTouch Pen INJECT 0.2ML INTO THE SKIN AT 10PM. INCREASE BY 2UNITS EVERY 3 DAYS TIL FASTING IS LESS THAN 120 30 pen 2  . TROKENDI XR 50 MG CP24 TAKE 1 CAPSULE BY MOUTH EVERY DAY 90 capsule 0  . [DISCONTINUED] DULoxetine (CYMBALTA) 20 MG capsule Take 1 capsule (20 mg total) by mouth daily. 90 capsule 1   Current Facility-Administered Medications on File Prior to  Visit  Medication Dose Route Frequency Provider Last Rate Last Dose  . levonorgestrel (MIRENA) 20 MCG/24HR IUD   Intrauterine Once Ok EdwardsFernandez, Juan H, MD       Allergies  Allergen Reactions  . Eggs Or Egg-Derived Products Hives and Other (See Comments)    FLU VACCINE: caused hives    . Influenza Vaccines Hives    Vaccine from Egg derived product causes Hives  . Effexor [Venlafaxine]     "felt weird"  . Metformin And Related Hives   Family History  Problem Relation Age of Onset  . Diabetes Mother   . Diabetes Father   . Cancer Father        Prostate cancer  . Cancer Sister        Cervical cancer  . Diabetes Brother   . Bipolar disorder Brother   . Alcohol abuse Brother   . Drug abuse Brother   . Diabetes Sister   . Diabetes Brother     PE: BP 132/84   Pulse 83   Wt 264 lb 3.2 oz (119.8 kg)   LMP 01/24/2015 Comment: perimenopausal  SpO2 96%   BMI 48.32 kg/m  Wt Readings from Last 3 Encounters:  07/12/17 264 lb 3.2 oz (119.8 kg)  05/20/17 263 lb (119.3 kg)  05/13/17 261 lb (118.4 kg)   Constitutional: Obese, in NAD Eyes: PERRLA, EOMI, no exophthalmos ENT: moist mucous membranes, no thyromegaly, no cervical lymphadenopathy Cardiovascular: RRR, No MRG Respiratory: CTA B Gastrointestinal: abdomen soft, NT, ND, BS+ Musculoskeletal: no deformities, strength intact in all 4 Skin: moist, warm, no  rashes Neurological: no tremor with outstretched hands, DTR normal in all 4  ASSESSMENT: 1. DM2, insulin-dependent, uncontrolled, with complications - CKD stage 2 - DR  PLAN:  1. Patient with long-standing, uncontrolled diabetes, on oral antidiabetic regimen + basal-bolus insulin regimen + Trulicity, which became insufficient. HbA1c today: 8.4% (higher). - Her sugars are higher than target, therefore, we will increase the Tresiba slightly  - Also, we discussed about restarting Marcelline DeistFarxiga to cover her meals better.  We will see if she still has problems with yeast infections >> If she does, can start intravaginal terconazole. - I suggested to:  Patient Instructions  Please increase: - Tresiba to 20 units  Please restart: - Farxiga 10 mg daily in am  Please continue: - Trulicity 1.5 mg weekly   Please let me know if the sugars are consistently <80 or >200.  Please return in 1.5 months with your sugar log.   - Strongly advised her to start checking sugars at different times of the day - check 2 times a day, rotating checks - given sugar log and advised how to fill it and to bring it at next appt  - given foot care handout and explained the principles  - given instructions for hypoglycemia management "15-15 rule"  - advised for yearly eye exams  - Return to clinic in 1.5 mo with sugar log   - time spent with the patient: 40 min, of which >50% was spent in obtaining information about her is, reviewing her previous labs, evaluations, treatment, sugars at home, counseling her about her condition (please see the discussed topics above), and developing a plan to further  treat it.  Carlus Pavlovristina Ewelina Naves, MD PhD [facility]

## 2017-07-13 ENCOUNTER — Other Ambulatory Visit (HOSPITAL_COMMUNITY): Payer: 59

## 2017-07-14 ENCOUNTER — Other Ambulatory Visit (HOSPITAL_COMMUNITY): Payer: 59

## 2017-07-14 ENCOUNTER — Ambulatory Visit (INDEPENDENT_AMBULATORY_CARE_PROVIDER_SITE_OTHER): Payer: PRIVATE HEALTH INSURANCE | Admitting: Licensed Clinical Social Worker

## 2017-07-14 DIAGNOSIS — F411 Generalized anxiety disorder: Secondary | ICD-10-CM

## 2017-07-14 DIAGNOSIS — F332 Major depressive disorder, recurrent severe without psychotic features: Secondary | ICD-10-CM

## 2017-07-14 NOTE — Progress Notes (Signed)
   THERAPIST PROGRESS NOTE  Session Time: 1:09pm-2:16pm  Participation Level: Active  Behavioral Response: Casual Alert Anxious Depressed  Type of Therapy: Individual Therapy  Treatment Goals addressed: Reduce symptoms of depression and anxiety to improve daily functioning  Interventions: Psycho-ed about depression, treatment planning  Suicidal/Homicidal: Denied both  Therapist Interventions: Added goals related to depression to patient's treatment plan. Educated patient about potential causes of depression.  Prompted patient to consider when she first started having problems with symptoms of depression and anxiety.     Talked about PTSD and expressed intentions of assessing patient further on this diagnosis at next session.    Summary:  Described witnessing a traumatic event in April 2018.  Noted she still thinks about that event quite a bit.  Acknowledged depression and anxiety symptoms came on within a month or so of that event.  Indicated she would like to return to the level of functioning she was at prior to that event.  Reported she had been more social, more motivated, and more active. Reported that depression does not seem to run in her family.  Indicated it does correlate with experiencing very stressful events.    Did not make it to appointment with psychiatrist.  Had to reschedule for mid-January.  Therapist advised patient to see her PCP for medication management in the meantime.   Plan: Next time have patient complete PCL-5 to assess for PTSD-related symptoms.  Diagnosis:  MDD, recurrent, severe without psychotic features                      GAD                     R/O PTSD    Marilu FavreSolomon, Sarah A, LCSW 07/14/2017

## 2017-07-15 ENCOUNTER — Encounter: Payer: Self-pay | Admitting: Physician Assistant

## 2017-07-15 ENCOUNTER — Other Ambulatory Visit (HOSPITAL_COMMUNITY): Payer: 59

## 2017-07-16 ENCOUNTER — Encounter: Payer: Self-pay | Admitting: Physician Assistant

## 2017-07-16 ENCOUNTER — Other Ambulatory Visit (HOSPITAL_COMMUNITY): Payer: 59

## 2017-07-16 MED ORDER — BUPROPION HCL ER (XL) 300 MG PO TB24: 300.0000 mg | ORAL_TABLET | Freq: Every day | ORAL | 1 refills | Status: DC

## 2017-07-19 ENCOUNTER — Other Ambulatory Visit (HOSPITAL_COMMUNITY): Payer: 59

## 2017-07-20 ENCOUNTER — Other Ambulatory Visit (HOSPITAL_COMMUNITY): Payer: 59

## 2017-07-21 ENCOUNTER — Ambulatory Visit: Payer: Self-pay | Admitting: Physician Assistant

## 2017-07-21 ENCOUNTER — Ambulatory Visit (HOSPITAL_COMMUNITY): Payer: Self-pay | Admitting: Licensed Clinical Social Worker

## 2017-07-23 ENCOUNTER — Ambulatory Visit (INDEPENDENT_AMBULATORY_CARE_PROVIDER_SITE_OTHER): Payer: PRIVATE HEALTH INSURANCE | Admitting: Licensed Clinical Social Worker

## 2017-07-23 DIAGNOSIS — F332 Major depressive disorder, recurrent severe without psychotic features: Secondary | ICD-10-CM | POA: Diagnosis not present

## 2017-07-23 DIAGNOSIS — F411 Generalized anxiety disorder: Secondary | ICD-10-CM

## 2017-07-23 NOTE — Progress Notes (Signed)
   THERAPIST PROGRESS NOTE  Session Time: 10:05am-10:57am  Participation Level: Active  Behavioral Response: Casual Alert Anxious Depressed  Type of Therapy: Individual Therapy  Treatment Goals addressed: Reduce symptoms of depression and anxiety to improve daily functioning  Interventions: Assessment, solution focused, CBT  Suicidal/Homicidal: Denied both  Therapist Interventions:   Decided to postpone having patient complete the measure assessing PTSD symptoms as it is more important at this time to provide suggestions of coping strategies for managing her depression. Helped patient to develop a Depression Self-Care Action Plan.  This involved setting small goals for increasing physical activity, making time for pleasurable activities, spending time with supportive people, practicing relaxation, and setting reasonable goals breaking them down into smaller steps.  Summary:  No significant change in symptoms since last seen.  Admitted to spending the majority of time isolated in her bedroom.  Has support from sister and daughter.  In the past week she has developed a habit of reading devotional prayers and writing about her thoughts and feelings each morning. Thanked the therapist for the coping suggestions.  Expressed a belief that she is capable of setting reasonable goals and accomplishing them.     Plan: Scheduled to return 08/02/17. Will check on implementation of Self-Care Action Plan.  Diagnosis:  MDD, recurrent, severe without psychotic features                      GAD                     R/O PTSD    Marilu FavreSolomon, Sarah A, LCSW 07/23/2017

## 2017-07-27 ENCOUNTER — Telehealth (HOSPITAL_COMMUNITY): Payer: Self-pay | Admitting: Licensed Clinical Social Worker

## 2017-07-27 ENCOUNTER — Ambulatory Visit (INDEPENDENT_AMBULATORY_CARE_PROVIDER_SITE_OTHER): Payer: PRIVATE HEALTH INSURANCE | Admitting: Licensed Clinical Social Worker

## 2017-07-27 DIAGNOSIS — F332 Major depressive disorder, recurrent severe without psychotic features: Secondary | ICD-10-CM | POA: Diagnosis not present

## 2017-07-27 DIAGNOSIS — F411 Generalized anxiety disorder: Secondary | ICD-10-CM

## 2017-07-27 NOTE — Telephone Encounter (Signed)
Can you block my schedule on Thursday the 20th from 11-12? That is the time I've scheduled to speak to someone about this patient's claim

## 2017-07-27 NOTE — Telephone Encounter (Signed)
VM left up front to talk to Mikeal HawthorneSarah - Julian James with [employer] disability would like to speak to you in regards to this claim.  The did receive the paperwork you have submitted and are sending this to their psychartist. They will need to speak to you on the 20th or 21st.  Please call Mr Fayrene Fearingjames back at [phone]  Claim # [id number].

## 2017-07-27 NOTE — Progress Notes (Signed)
   THERAPIST PROGRESS NOTE  Session Time: 3:08pm-4:02pm  Participation Level: Active  Behavioral Response: Casual Alert Anxious   Type of Therapy: Individual Therapy  Treatment Goals addressed: Reduce symptoms of depression and anxiety to improve daily functioning  Interventions: Assessment, sleep hygiene  Suicidal/Homicidal: Denied both  Therapist Interventions:   Assessed patient's implementation of her Depression Self-Care Action Plan.  Encouraged her to continue to follow the plan. Had patient complete a PHQ-9 and GAD-7 to assess for severity of symptoms in the past two weeks.   Provided tips for sleep hygiene.  Identified three things she can work on to improve her sleep: refraining from looking at the clock throughout the night, reserving time in bed only for sleep, and engaging in something relaxing or boring during the night when she can't sleep.     Summary:  Reported feeling a bit better in the past few days.  Has been more active.  Cleaning her house, getting out with family, going to church, and she started walking some.   [facility] 07/27/2017 06/23/2017 05/20/2017 05/19/2017 10/20/2016  Decreased Interest 3 3 3 3  0  Down, Depressed, Hopeless 3 3 3 3 2   PHQ - 2 Score 6 6 6 6 2   Altered sleeping 3 3 3 3 2   Tired, decreased energy 2 3 3 3 3   Change in appetite 3 3 3 3 1   Feeling bad or failure about yourself  2 2 2 3 1   Trouble concentrating 3 3 3 3 1   Moving slowly or fidgety/restless 3 3 3  0 0  Suicidal thoughts 0 0 0 - 0  PHQ-9 Score 22 23 23 21 10   Difficult doing work/chores - - Extremely dIfficult - -   GAD 7 : Generalized Anxiety Score 07/27/2017 06/23/2017 05/20/2017 05/19/2017  Nervous, Anxious, on Edge 2 3 3 3   Control/stop worrying 3 3 3 3   Worry too much - different things 3 3 3 3   Trouble relaxing 2 3 3 3   Restless 2 3 3 3   Easily annoyed or irritable 3 3 3 3   Afraid - awful might happen 3 3 3 3   Total GAD 7 Score 18 21 21 21    Anxiety Difficulty Very difficult Extremely difficult Extremely difficult Extremely difficult    Reported getting maybe only about 2 hours of sleep each night.  Usually able to fall asleep OK, but wakes up and can't fall back asleep.    Plan: Scheduled to return 08/02/17.  Will check on Self Care Action Plan and behavioral changes related to her sleep routine.     Diagnosis:  MDD, recurrent, severe without psychotic features                      GAD                     R/O PTSD    Marilu FavreSolomon, Sarah A, LCSW 07/27/2017

## 2017-07-29 ENCOUNTER — Encounter (HOSPITAL_COMMUNITY): Payer: Self-pay | Admitting: Psychiatry

## 2017-07-29 ENCOUNTER — Ambulatory Visit (INDEPENDENT_AMBULATORY_CARE_PROVIDER_SITE_OTHER): Payer: PRIVATE HEALTH INSURANCE | Admitting: Psychiatry

## 2017-07-29 VITALS — BP 130/78 | HR 97 | Ht 62.0 in | Wt 266.0 lb

## 2017-07-29 DIAGNOSIS — F332 Major depressive disorder, recurrent severe without psychotic features: Secondary | ICD-10-CM

## 2017-07-29 DIAGNOSIS — F17210 Nicotine dependence, cigarettes, uncomplicated: Secondary | ICD-10-CM | POA: Diagnosis not present

## 2017-07-29 DIAGNOSIS — Z79899 Other long term (current) drug therapy: Secondary | ICD-10-CM | POA: Diagnosis not present

## 2017-07-29 DIAGNOSIS — E119 Type 2 diabetes mellitus without complications: Secondary | ICD-10-CM | POA: Diagnosis not present

## 2017-07-29 DIAGNOSIS — F411 Generalized anxiety disorder: Secondary | ICD-10-CM

## 2017-07-29 IMAGING — CT CT ABD-PELV W/O CM
3 of 5 series · 17 of 46 positions shown, 19 images · non-contrast
Comparison: 03/20/2015.

CLINICAL DATA: Right-sided abdominal pain and tenderness with
vomiting, diarrhea and nausea.

EXAM:
CT ABDOMEN AND PELVIS WITHOUT CONTRAST
TECHNIQUE: Multidetector CT imaging of the abdomen and pelvis was performed
following the standard protocol without IV contrast.

[Series 2: axial st · axial · 0.84mm/px · z∈[-442,-52]mm · 12 of 94 slices shown, 14 images]
[im 8/94  soft-tissue]
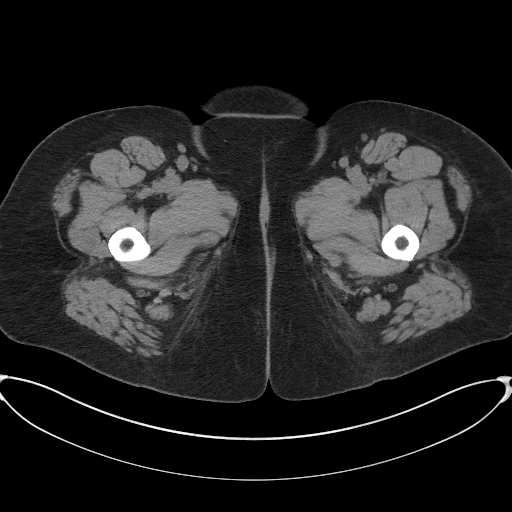
[im 8/94  bone]
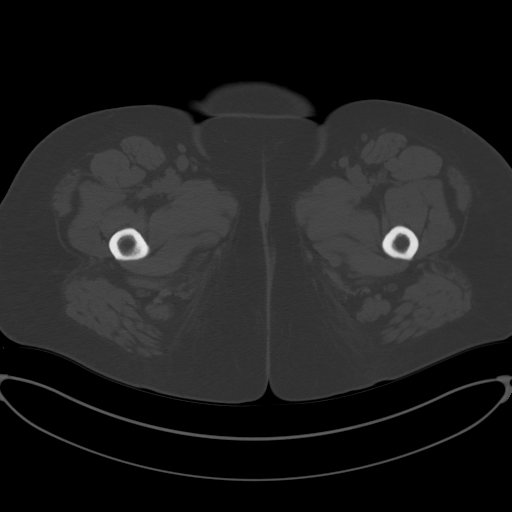
[im 15/94  soft-tissue]
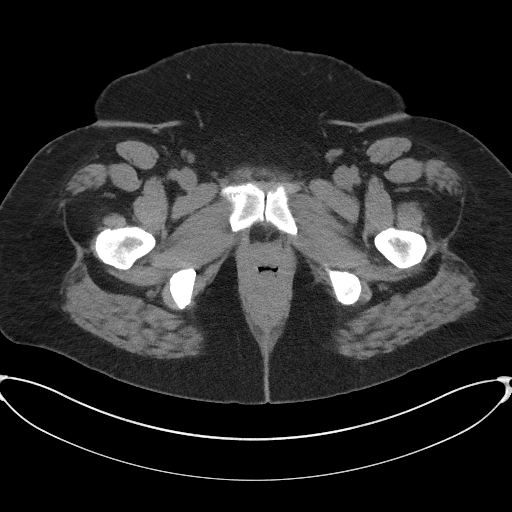
[im 22/94  soft-tissue]
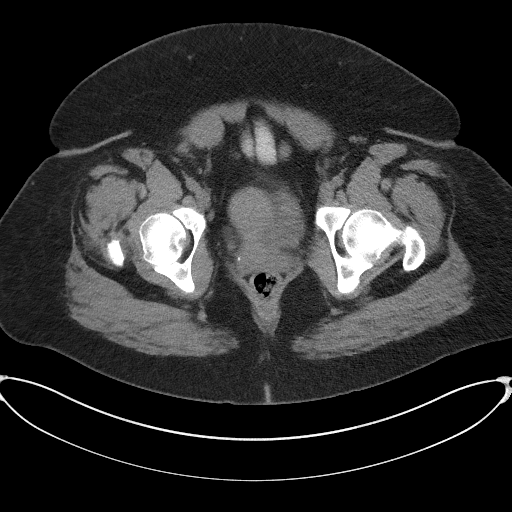
[im 29/94  soft-tissue]
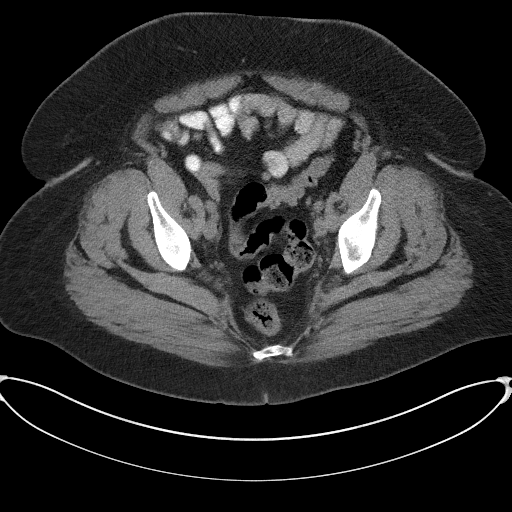
[im 36/94  soft-tissue]
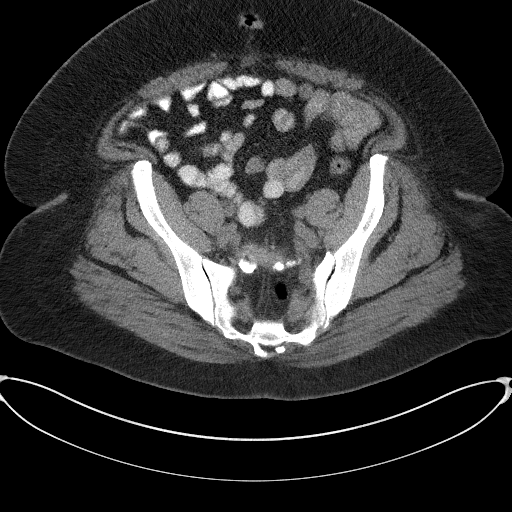
[im 43/94  soft-tissue]
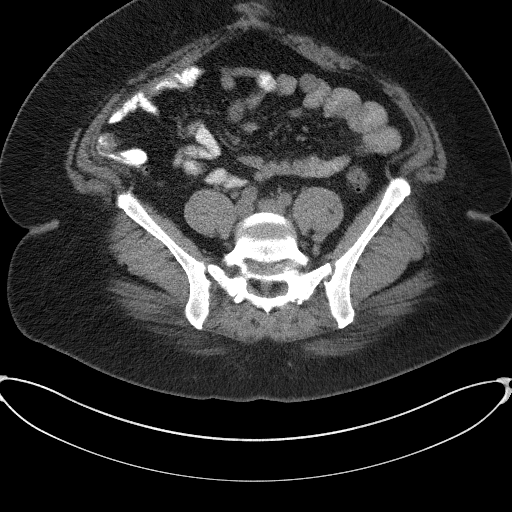
[im 51/94  soft-tissue]
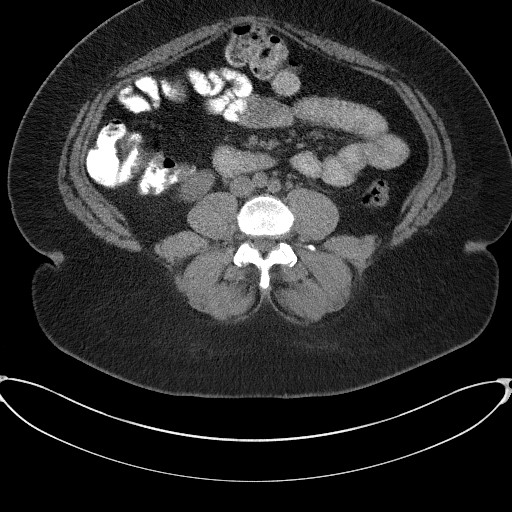
[im 58/94  soft-tissue]
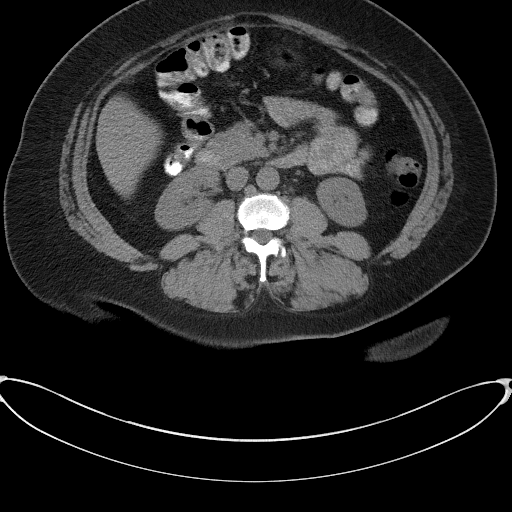
[im 65/94  soft-tissue]
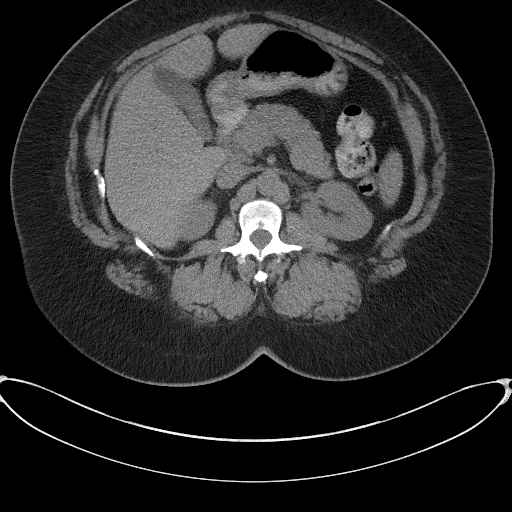
[im 65/94  bone]
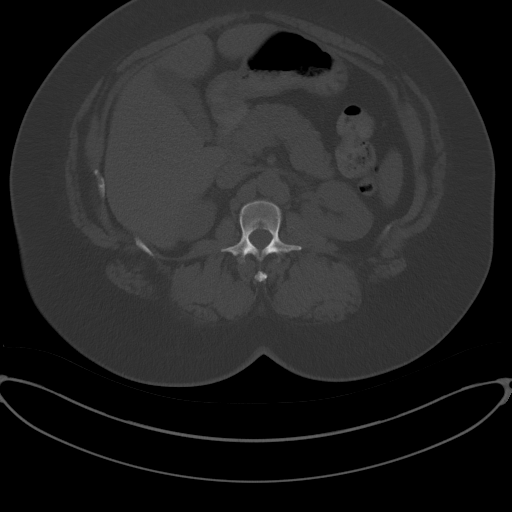
[im 72/94  soft-tissue]
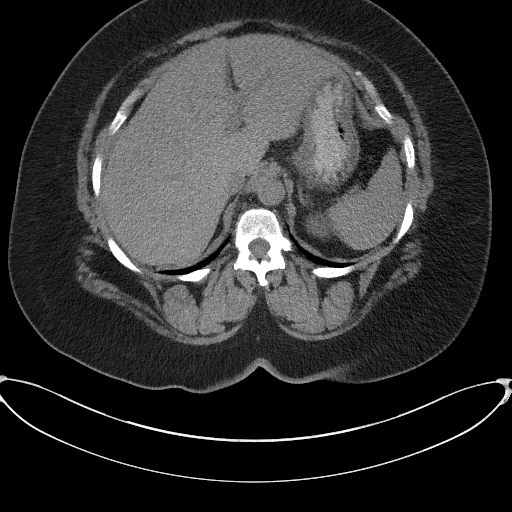
[im 79/94  soft-tissue]
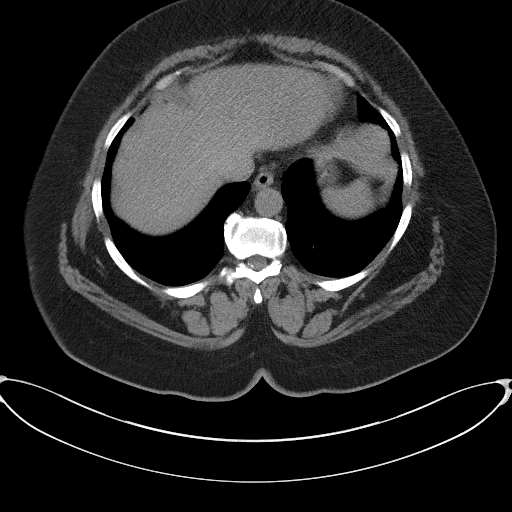
[im 86/94  soft-tissue]
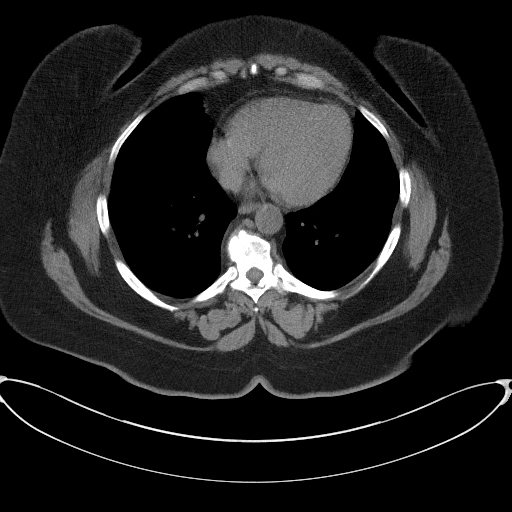

[Series 4: lung bases · axial · 0.69mm/px · z∈[-93,-53]mm · 2 of 25 slices shown]
[im 9/25  bone]
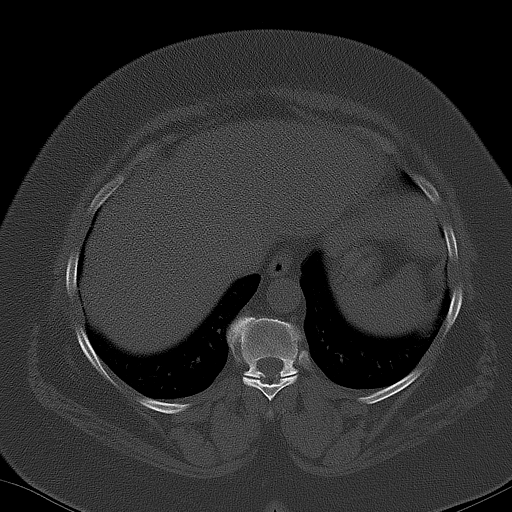
[im 17/25  bone]
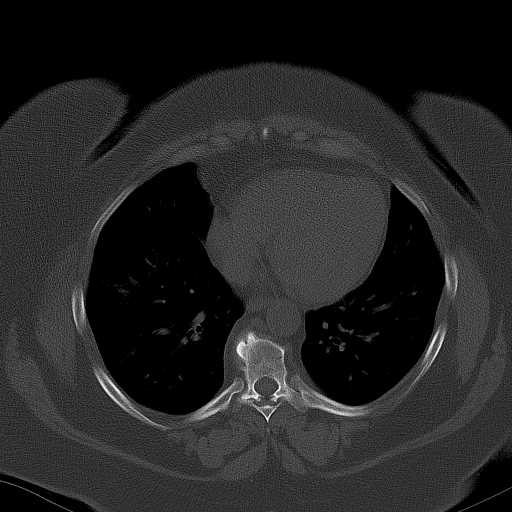

[Series 6: coronal st · coronal · 0.91mm/px · 3 of 119 slices shown]
[im 40/119  soft-tissue]
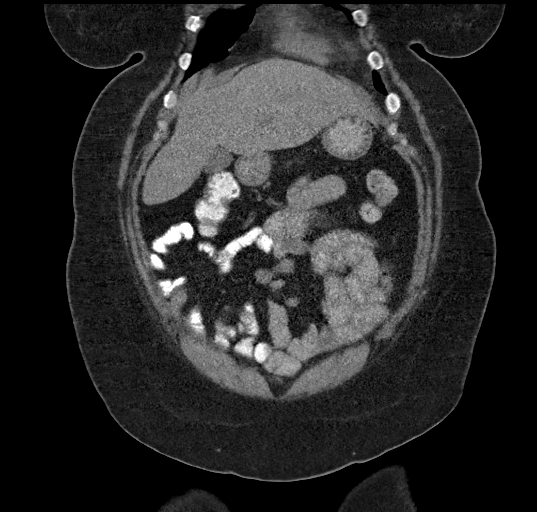
[im 53/119  soft-tissue]
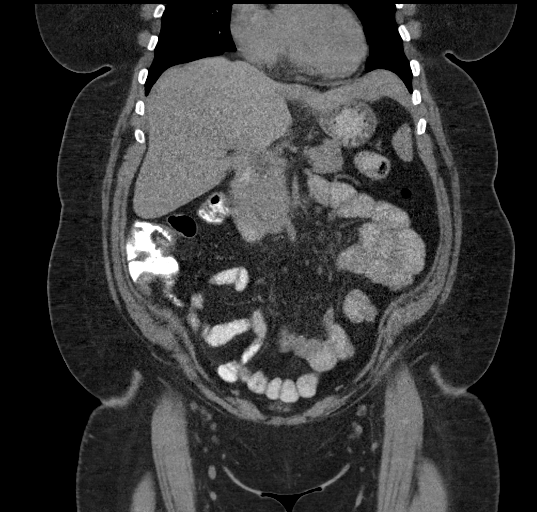
[im 66/119  soft-tissue]
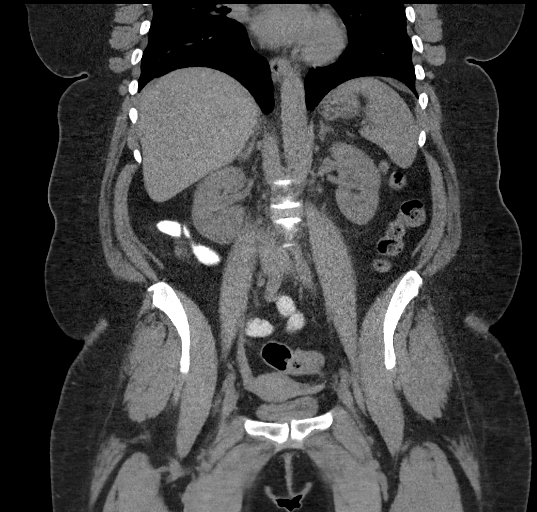

[17 of 46 positions shown; findings below may reference images not displayed]

FINDINGS: Lower chest: Nodules along the right major fissure measure up to 5
mm, likely subpleural lymph nodes. Heart size normal. No pericardial
or pleural effusion.

Hepatobiliary: Liver and gallbladder are unremarkable. No biliary
ductal dilatation.

Pancreas: Negative.

Spleen: Negative.

Adrenals/Urinary Tract: Adrenal glands and kidneys are unremarkable.
Ureters are decompressed. Bladder is low in volume.

Stomach/Bowel: Stomach, small bowel, appendix colon are
unremarkable.

Vascular/Lymphatic: Minimal atherosclerotic calcification of the
arterial vasculature. No pathologically enlarged lymph nodes.

Reproductive: Uterus is visualized.  No adnexal mass.

Other: Periumbilical hernia contains a small amount of fat.
Mesenteries and peritoneum are otherwise unremarkable. No free
fluid.

Musculoskeletal: No worrisome lytic or sclerotic lesions. Chronic
bilateral L5 pars defects with grade 1 anterolisthesis.
IMPRESSION: No findings to explain the patient's symptoms.

## 2017-07-29 NOTE — Progress Notes (Signed)
[facility] Outpatient Follow up visit   Patient Identification: Jasmine [name] MRN:  [id number] Date of Evaluation:  07/29/2017 Referral Source: primary care Chief Complaint:   Chief Complaint    Follow-up; Other     Visit Diagnosis:    ICD-10-CM   1. Severe episode of recurrent major depressive disorder, without psychotic features (HCC) F33.2   2. Generalized anxiety disorder F41.1   3. Anxiety state F41.1     History of Present Illness:  51 years old AA female, married initially referred by PCP For management and assessment of depression and anxiety  Has disaiblity due to diabetes, need large glasses, computer screen to work and sensitive to light. Her supervisor would pass comments that she felt offended. Now off work due to anxiety and panic.  Dad was murdered when she was young  Husband mostly out of town, she worries about him.  meds doing some balance, still not ready to go to work, has apprehension and panic when she thinks of it. Doing therapy  Aggravating factor: father murdered, when she was youn, Weight gain. disability Modifying factor: daughter,  husband     Past Psychiatric History: depression  Previous Psychotropic Medications: No   Substance Abuse History in the last 12 months:  No.  Consequences of Substance Abuse: NA  Past Medical History:  Past Medical History:  Diagnosis Date  . Anxiety   . Arthritis    "knees" (03/28/2014)  . Chronic renal insufficiency    stage II (mild)  . Depression   . Headache    with high blood sugar  . Hypercholesteremia   . Hypertension   . Numbness and tingling of right arm and leg   . Seasonal allergies   . Type II diabetes mellitus (HCC)   . Umbilical hernia without mention of obstruction or gangrene   . Wears glasses     Past Surgical History:  Procedure Laterality Date  . INSERTION OF MESH N/A 03/28/2014   Procedure: INSERTION OF MESH;  Surgeon: Axel FillerArmando Ramirez, MD;  [facility];  Service: General;   Laterality: N/A;  . INSERTION OF MESH N/A 05/08/2015   Procedure: INSERTION OF MESH;  Surgeon: Axel FillerArmando Ramirez, MD;  Location: [facility] OR;  Service: General;  Laterality: N/A;  . LAPAROSCOPIC LYSIS OF ADHESIONS N/A 05/08/2015   Procedure: LAPAROSCOPIC LYSIS OF ADHESIONS;  Surgeon: Axel FillerArmando Ramirez, MD;  Location: [facility] OR;  Service: General;  Laterality: N/A;  . MULTIPLE TOOTH EXTRACTIONS  ~ 2003  . TONSILLECTOMY    . UMBILICAL HERNIA REPAIR  1973  . VENTRAL HERNIA REPAIR N/A 03/28/2014   Procedure: LAPAROSCOPIC VENTRAL HERNIA;  Surgeon: Axel FillerArmando Ramirez, MD;  Location: [facility] OR;  Service: General;  Laterality: N/A;  . VENTRAL HERNIA REPAIR N/A 05/08/2015   Procedure: LAPAROSCOPIC VENTRAL HERNIA REPAIR WITH MESH;  Surgeon: Axel FillerArmando Ramirez, MD;  Location: [facility] OR;  Service: General;  Laterality: N/A;    Family Psychiatric History: see below  Family History:  Family History  Problem Relation Age of Onset  . Diabetes Mother   . Diabetes Father   . Cancer Father        Prostate cancer  . Cancer Sister        Cervical cancer  . Diabetes Brother   . Bipolar disorder Brother   . Alcohol abuse Brother   . Drug abuse Brother   . Diabetes Sister   . Diabetes Brother     Social History:   Social History   Socioeconomic History  . Marital  status: Married    Spouse name: None  . Number of children: None  . Years of education: None  . Highest education level: None  Social Needs  . Financial resource strain: None  . Food insecurity - worry: None  . Food insecurity - inability: None  . Transportation needs - medical: None  . Transportation needs - non-medical: None  Occupational History  . None  Tobacco Use  . Smoking status: Current Some Day Smoker    Packs/day: 0.25    Years: 26.00    Pack years: 6.50    Types: Cigarettes  . Smokeless tobacco: Never Used  . Tobacco comment: down to 1 cigarette a week  Substance and Sexual Activity  . Alcohol use: No    Comment: 03/28/2014 "have a drink a  few times/year"  . Drug use: No  . Sexual activity: Not Currently  Other Topics Concern  . None  Social History Narrative  . None     Works [employer]  Allergies:   Allergies  Allergen Reactions  . Eggs Or Egg-Derived Products Hives and Other (See Comments)    FLU VACCINE: caused hives    . Influenza Vaccines Hives    Vaccine from Egg derived product causes Hives  . Effexor [Venlafaxine]     "felt weird"  . Metformin And Related Swelling    ANGIOEDEMA    Metabolic Disorder Labs: Lab Results  Component Value Date   HGBA1C 8.4 07/12/2017   MPG 384 (H) 01/09/2015   MPG 160 (H) 03/28/2014   No results found for: PROLACTIN Lab Results  Component Value Date   CHOL 198 06/16/2016   TRIG 142 06/16/2016   HDL 54 06/16/2016   CHOLHDL 3.7 06/16/2016   VLDL 28 06/16/2016   LDLCALC 116 (H) 06/16/2016   LDLCALC 91 11/27/2013     Current Medications: Current Outpatient Medications  Medication Sig Dispense Refill  . buPROPion (WELLBUTRIN XL) 300 MG 24 hr tablet Take 1 tablet (300 mg total) by mouth daily. 30 tablet 1  . cetirizine (ZYRTEC) 10 MG tablet Take 10 mg by mouth daily as needed for allergies.    . dapagliflozin propanediol (FARXIGA) 10 MG TABS tablet Take 10 mg by mouth daily. 90 tablet 1  . Dulaglutide (TRULICITY) 1.5 MG/0.5ML SOPN Inject 1.5 mg into the skin once a week. 12 pen 1  . fluticasone (FLONASE) 50 MCG/ACT nasal spray Place 2 sprays into both nostrils daily. 16 g 5  . glucose blood test strip One Touch Ultra meter, test strips & lancets . Check blood sugar 3 times daily. Diagnosis - Diabetes ICD 10 - E 11.22 100 each 11  . losartan-hydrochlorothiazide (HYZAAR) 50-12.5 MG tablet Take 1 tablet by mouth daily. 90 tablet 3  . omeprazole (PRILOSEC) 40 MG capsule Take 1 capsule (40 mg total) by mouth daily. 30 capsule 3  . sertraline (ZOLOFT) 100 MG tablet Take one and one/half tablet daily. 135 tablet 0  . simvastatin (ZOCOR) 10 MG tablet Take 1 tablet (10 mg  total) daily by mouth. Need lab work for future refills. 30 tablet 0  . traMADol (ULTRAM) 50 MG tablet Take 1 tablet (50 mg total) by mouth every 8 (eight) hours as needed. 30 tablet 0  . TRESIBA FLEXTOUCH 100 UNIT/ML SOPN FlexTouch Pen INJECT 0.2ML INTO THE SKIN AT 10PM. INCREASE BY 2UNITS EVERY 3 DAYS TIL FASTING IS LESS THAN 120 30 pen 2  . TROKENDI XR 50 MG CP24 TAKE 1 CAPSULE BY MOUTH EVERY DAY  90 capsule 0   Current Facility-Administered Medications  Medication Dose Route Frequency Provider Last Rate Last Dose  . levonorgestrel (MIRENA) 20 MCG/24HR IUD   Intrauterine Once Ok Edwards, MD         Psychiatric Specialty Exam: Review of Systems  Cardiovascular: Negative for chest pain.  Psychiatric/Behavioral: Negative for suicidal ideas. The patient is nervous/anxious.     Blood pressure 130/78, pulse 97, height 5\' 2"  (1.575 m), weight 266 lb (120.7 kg), last menstrual period 01/24/2015.Body mass index is 48.65 kg/m.  General Appearance: Casual  Eye Contact:  Fair  Speech:  Slow  Volume:  Decreased  Mood: subdued  Affect:  constricted  Thought Process:  Goal Directed and Descriptions of Associations: Intact  Orientation:  Full (Time, Place, and Person)  Thought Content:  Rumination  Suicidal Thoughts:  No  Homicidal Thoughts:  No  Memory:  Immediate;   Fair Recent;   Fair  Judgement:  Fair  Insight:  Fair  Psychomotor Activity:  Decreased  Concentration:  Concentration: Fair and Attention Span: Fair  Recall:  [employer] of Knowledge:Fair  Language: Good  Akathisia:  Negative  Handed:  Right  AIMS (if indicated):  0  Assets:  Desire for Improvement  ADL's:  Intact  Cognition: WNL  Sleep:  fair    Treatment Plan Summary: Medication management and Plan as follows  1. Major depression: moderate:  Not worse, still subdued, continue wellbutrina nd zoloft 2. GAD: worries related to work of apprehension of work. Continue therapy to work on Pharmacologist. Continue  zoloft  More than 50% of time spent in counseling and coordination of care including patient education provided supportive therapy. To continue work on Pharmacologist. Has meds . Fu 4-6 weeks  Thresa Ross, MD 12/20/20182:19 PM

## 2017-07-30 ENCOUNTER — Telehealth (HOSPITAL_COMMUNITY): Payer: Self-pay | Admitting: Licensed Clinical Social Worker

## 2017-07-30 ENCOUNTER — Ambulatory Visit (HOSPITAL_COMMUNITY): Payer: Self-pay | Admitting: Psychiatry

## 2017-07-30 NOTE — Telephone Encounter (Signed)
Returned call.  Spoke with on call physician, Dr. Lahoma RockerSherman.  He asked for an opinion on when patient is likely to be ready to return to work.  Therapist estimated one month.

## 2017-08-02 ENCOUNTER — Ambulatory Visit (INDEPENDENT_AMBULATORY_CARE_PROVIDER_SITE_OTHER): Payer: PRIVATE HEALTH INSURANCE | Admitting: Licensed Clinical Social Worker

## 2017-08-02 ENCOUNTER — Other Ambulatory Visit: Payer: Self-pay | Admitting: Physician Assistant

## 2017-08-02 DIAGNOSIS — F332 Major depressive disorder, recurrent severe without psychotic features: Secondary | ICD-10-CM | POA: Diagnosis not present

## 2017-08-02 DIAGNOSIS — F411 Generalized anxiety disorder: Secondary | ICD-10-CM

## 2017-08-02 NOTE — Progress Notes (Signed)
   THERAPIST PROGRESS NOTE  Session Time: 11:01am-11:58am  Participation Level: Active  Behavioral Response: Casual Alert Anxious   Type of Therapy: Individual Therapy  Treatment Goals addressed: Reduce symptoms of depression and anxiety to improve daily functioning  Interventions: Mindfulness  Suicidal/Homicidal: Denied both  Therapist Interventions:  Introduced the concept of mindfulness.  Emphasized how learning to focus on the present can help you to feel more in control of your emotions.  Explained how it can be useful to practice at times when you catch yourself having unhelpful thoughts.  Described how you can practice mindfulness by doing tasks in a mindful way, focusing on an object, or through focused breathing.  Encouraged patient to practice the skills regularly in addition to times of distress.   Summary:  Has continued to stay a bit more active.  Reported staying out of her room during the day.  Has been doing some walking.  Getting a little enjoyment from sitting in her living room drinking tea and watching the Christmas lights.  Sleep has improved slightly.  Described spending a lot of time ruminating about the future or dwelling on the past.   Seemed to understand concepts discussed today.  Acknowledged "It's a lot, but I think it is going to be very helpful."  Commented on how she is looking forward to practicing the focused breathing.    Plan: Scheduled to return in one week .  Will check on implementation of mindfulness skills.  Diagnosis:  MDD, recurrent, severe without psychotic features                      GAD                     R/O PTSD    Marilu FavreSolomon, Jahmar Mckelvy A, LCSW 08/02/2017

## 2017-08-09 ENCOUNTER — Ambulatory Visit (INDEPENDENT_AMBULATORY_CARE_PROVIDER_SITE_OTHER): Payer: PRIVATE HEALTH INSURANCE | Admitting: Licensed Clinical Social Worker

## 2017-08-09 DIAGNOSIS — F411 Generalized anxiety disorder: Secondary | ICD-10-CM

## 2017-08-09 DIAGNOSIS — F332 Major depressive disorder, recurrent severe without psychotic features: Secondary | ICD-10-CM

## 2017-08-09 NOTE — Progress Notes (Signed)
   THERAPIST PROGRESS NOTE  Session Time: 11:10am-12:05pm  Participation Level: Active  Behavioral Response: Casual Alert Anxious   Type of Therapy: Individual Therapy  Treatment Goals addressed: Reduce symptoms of depression and anxiety to improve daily functioning  Interventions: Psycho-ed about bipolar  Suicidal/Homicidal: Denied both  Therapist Interventions:  Discussed how patient handled an unpleasant interaction with her brother.  She noted that he is bipolar.  Asked her how well educated she is about bipolar.  Explained that individuals with this disorder have episodes of depression as well as episodes of mania/hypomania.  Reviewed [name] list of symptoms to educate her about mania/hypomania.  Discussed how it can be difficult to know how best to support her brother.     Summary:  Indicated she appreciated therapist educating her about bipolar disorder.  Commented on how her brother seemed to fit the criteria.  Noted that in her opinion she has never had an episode of hypomania.   Reported that her brother has not been able to work in about 16 years, ever since the death of their dad.  He gets disability benefits.  Is not responsible with his money.  Relies on family for [name] lot.     Reported she downloaded [name] couple of apps for mindfulness and she has been practicing it daily.  She said "I like it.  I feel [name] little more upbeat."       Plan: Will assess for PTSD at next appointment.  Diagnosis:  MDD, recurrent, severe without psychotic features                      GAD                     R/O PTSD    Jasmine [name], Jasmine A, LCSW 08/09/2017

## 2017-08-11 ENCOUNTER — Telehealth (HOSPITAL_COMMUNITY): Payer: Self-pay | Admitting: Licensed Clinical Social Worker

## 2017-08-11 NOTE — Telephone Encounter (Signed)
Pt called and would like to speak with you. She would not give any information. She states this is urgent.   CB # [phone]

## 2017-08-11 NOTE — Telephone Encounter (Signed)
Returned call from patient.  Patient reported she was denied short term disability.  She was tearful.  Said that the paperwork therapist had completed did not show evidence of impaired functioning.  Requested therapist write a letter with more supporting evidence and send it to the disability determination office.  Therapist said she would consider her request.

## 2017-08-12 ENCOUNTER — Ambulatory Visit (INDEPENDENT_AMBULATORY_CARE_PROVIDER_SITE_OTHER): Payer: PRIVATE HEALTH INSURANCE | Admitting: Licensed Clinical Social Worker

## 2017-08-12 DIAGNOSIS — F411 Generalized anxiety disorder: Secondary | ICD-10-CM

## 2017-08-12 DIAGNOSIS — F332 Major depressive disorder, recurrent severe without psychotic features: Secondary | ICD-10-CM

## 2017-08-13 NOTE — Progress Notes (Signed)
   THERAPIST PROGRESS NOTE  Session Time: 1:07pm-2:04pm  Participation Level: Active  Behavioral Response: Casual Alert Anxious   Type of Therapy: Individual Therapy  Treatment Goals addressed: Reduce symptoms of depression and anxiety to improve daily functioning  Interventions: Assessment  Suicidal/Homicidal: Denied both  Therapist Interventions:   Offered an additional therapy session this week because patient called in distress about the fact that her short term disability claim was denied.   Used a tool called the WHODAS 2.0 as a guide for assessing patient's level of disability on a variety of items.  This included things like being able to take care of household responsibilities, participating in community activities, concentration, hygiene, and interacting with others.  Discussed how difficulties in these areas would interfere with her ability to manage work responsibilities.      Summary:  Indicated that two of the biggest barriers to completing tasks are a lack of motivation and impaired concentration.  Also noted getting easily annoyed with others and lashing out verbally at times.    Explained that she is disabled not only by her mental health condition, but also because of  her medical problems.  Reported having arthritis in her left knee and pain in her feet which make it hard for her to tolerate standing for long periods.  Said that bending over is difficult as well.  She can't walk for long distances or handle too many stairs.        Plan: Will assess for PTSD at next appointment.  Diagnosis:  MDD, recurrent, severe without psychotic features                      GAD                     R/O PTSD    Marilu FavreSolomon, Danylle Ouk A, LCSW 08/12/2017

## 2017-08-17 ENCOUNTER — Ambulatory Visit (INDEPENDENT_AMBULATORY_CARE_PROVIDER_SITE_OTHER): Payer: 59 | Admitting: Women's Health

## 2017-08-17 ENCOUNTER — Encounter: Payer: Self-pay | Admitting: Women's Health

## 2017-08-17 VITALS — BP 118/80 | Ht 62.0 in | Wt 267.0 lb

## 2017-08-17 DIAGNOSIS — Z01419 Encounter for gynecological examination (general) (routine) without abnormal findings: Secondary | ICD-10-CM

## 2017-08-17 NOTE — Addendum Note (Signed)
Addended by: Tito DineBONHAM, KIM A on: 08/17/2017 11:18 AM   Modules accepted: Orders

## 2017-08-17 NOTE — Progress Notes (Signed)
Jasmine [name] July 02, 1966 [id number]    History:    Presents for annual exam. Last annual 2014. Amenorrheic greater than one year with hot flushes. Primary care manages hypertension hypercholesterolemia. Also struggling with depression with no feelings of self-harm. Type 2 diabetes on Farxiga and trulicity  per endocrinologist. Last mammogram 2016, normal Pap and mammogram history. Contemplated weight loss surgery, insurance company does not cover.  Past medical history, past surgical history, family history and social history were all reviewed and documented in the EPIC chart. Works at [employer], daughter [id number] doing well.  ROS:  A ROS was performed and pertinent positives and negatives are included.  Exam:  Vitals:   08/17/17 0957  BP: 118/80  Weight: 267 lb (121.1 kg)  Height: 5\' 2"  (1.575 m)   Body mass index is 48.83 kg/m.   General appearance:  Normal Thyroid:  Symmetrical, normal in size, without palpable masses or nodularity. Respiratory  Auscultation:  Clear without wheezing or rhonchi Cardiovascular  Auscultation:  Regular rate, without rubs, murmurs or gallops  Edema/varicosities:  Not grossly evident Abdominal  Soft,nontender, without masses, guarding or rebound.  Liver/spleen:  No organomegaly noted  Hernia:  None appreciated  Skin  Inspection:  Grossly normal   Breasts: Examined lying and sitting.     Right: Without masses, retractions, discharge or axillary adenopathy.     Left: Without masses, retractions, discharge or axillary adenopathy. Gentitourinary   Inguinal/mons:  Normal without inguinal adenopathy  External genitalia:  Normal  BUS/Urethra/Skene's glands:  Normal  Vagina:  Normal  Cervix:  Normal stenotic  Uterus:   normal in size, shape and contour.  Midline and mobile  Adnexa/parametria:     Rt: Without masses or tenderness.   Lt: Without masses or tenderness.  Anus and perineum: Normal  Digital rectal exam: Normal sphincter tone without palpated  masses or tenderness  Assessment/Plan:  52 y.o. MBF G1 P1 for annual exam.    Postmenopausal 1 year on no HRT with no bleeding Type 2 diabetes managed by endocrinologist Depression, hypercholesterolemia, hypertension-primary care manages labs and meds Morbid obesity  Plan: Menopause reviewed, Reviewed importance of calling if any further bleeding. SBE's, reviewed importance of annual screening mammogram, breast center information given instructed to schedule. Screening colonoscopy reviewed and discussed, [facility] information given will be 52 this month. Reviewed importance of increasing exercise and decreasing calories/carbs for weight loss and diabetes. Weight Watchers encouraged. Brisk walk most days of the week encouraged. Pap with HR HPV typing, new screening guidelines reviewed.    Harrington Challengerancy J Loy [facility] HospitalWHNP, 10:27 AM 08/17/2017

## 2017-08-17 NOTE — Patient Instructions (Addendum)
Mammogram  [phone] [facility]  Dr Carlean Purl  [phone]   Carbohydrate Counting for Diabetes Mellitus, Adult Carbohydrate counting is a method for keeping track of how many carbohydrates you eat. Eating carbohydrates naturally increases the amount of sugar (glucose) in the blood. Counting how many carbohydrates you eat helps keep your blood glucose within normal limits, which helps you manage your diabetes (diabetes mellitus). It is important to know how many carbohydrates you can safely have in each meal. This is different for every person. A diet and nutrition specialist (registered dietitian) can help you make a meal plan and calculate how many carbohydrates you should have at each meal and snack. Carbohydrates are found in the following foods:  Grains, such as breads and cereals.  Dried beans and soy products.  Starchy vegetables, such as potatoes, peas, and corn.  Fruit and fruit juices.  Milk and yogurt.  Sweets and snack foods, such as cake, cookies, candy, chips, and soft drinks.  How do I count carbohydrates? There are two ways to count carbohydrates in food. You can use either of the methods or a combination of both. Reading "Nutrition Facts" on packaged food The "Nutrition Facts" list is included on the labels of almost all packaged foods and beverages in the [place] It includes:  The serving size.  Information about nutrients in each serving, including the grams (g) of carbohydrate per serving.  To use the "Nutrition Facts":  Decide how many servings you will have.  Multiply the number of servings by the number of carbohydrates per serving.  The resulting number is the total amount of carbohydrates that you will be having.  Learning standard serving sizes of other foods When you eat foods containing carbohydrates that are not packaged or do not include "Nutrition Facts" on the label, you need to measure the servings in order to count the amount of  carbohydrates:  Measure the foods that you will eat with a food scale or measuring cup, if needed.  Decide how many standard-size servings you will eat.  Multiply the number of servings by 15. Most carbohydrate-rich foods have about 15 g of carbohydrates per serving. ? For example, if you eat 8 oz (170 g) of strawberries, you will have eaten 2 servings and 30 g of carbohydrates (2 servings x 15 g = 30 g).  For foods that have more than one food mixed, such as soups and casseroles, you must count the carbohydrates in each food that is included.  The following list contains standard serving sizes of common carbohydrate-rich foods. Each of these servings has about 15 g of carbohydrates:   hamburger bun or  English muffin.   oz (15 mL) syrup.   oz (14 g) jelly.  1 slice of bread.  1 six-inch tortilla.  3 oz (85 g) cooked rice or pasta.  4 oz (113 g) cooked dried beans.  4 oz (113 g) starchy vegetable, such as peas, corn, or potatoes.  4 oz (113 g) hot cereal.  4 oz (113 g) mashed potatoes or  of a large baked potato.  4 oz (113 g) canned or frozen fruit.  4 oz (120 mL) fruit juice.  4-6 crackers.  6 chicken nuggets.  6 oz (170 g) unsweetened dry cereal.  6 oz (170 g) plain fat-free yogurt or yogurt sweetened with artificial sweeteners.  8 oz (240 mL) milk.  8 oz (170 g) fresh fruit or one small piece of fruit.  24 oz (680 g) popped popcorn.  Example  of carbohydrate counting Sample meal  3 oz (85 g) chicken breast.  6 oz (170 g) brown rice.  4 oz (113 g) corn.  8 oz (240 mL) milk.  8 oz (170 g) strawberries with sugar-free whipped topping. Carbohydrate calculation 1. Identify the foods that contain carbohydrates: ? Rice. ? Corn. ? Milk. ? Strawberries. 2. Calculate how many servings you have of each food: ? 2 servings rice. ? 1 serving corn. ? 1 serving milk. ? 1 serving strawberries. 3. Multiply each number of servings by 15 g: ? 2 servings  rice x 15 g = 30 g. ? 1 serving corn x 15 g = 15 g. ? 1 serving milk x 15 g = 15 g. ? 1 serving strawberries x 15 g = 15 g. 4. Add together all of the amounts to find the total grams of carbohydrates eaten: ? 30 g + 15 g + 15 g + 15 g = 75 g of carbohydrates total. This information is not intended to replace advice given to you by your health care provider. Make sure you discuss any questions you have with your health care provider. Document Released: 07/27/2005 Document Revised: 02/14/2016 Document Reviewed: 01/08/2016 Elsevier Interactive Patient Education  2018 [employer] Maintenance for Postmenopausal Women Menopause is a normal process in which your reproductive ability comes to an end. This process happens gradually over a span of months to years, usually between the ages of 63 and 16. Menopause is complete when you have missed 12 consecutive menstrual periods. It is important to talk with your health care provider about some of the most common conditions that affect postmenopausal women, such as heart disease, cancer, and bone loss (osteoporosis). Adopting a healthy lifestyle and getting preventive care can help to promote your health and wellness. Those actions can also lower your chances of developing some of these common conditions. What should I know about menopause? During menopause, you may experience a number of symptoms, such as:  Moderate-to-severe hot flashes.  Night sweats.  Decrease in sex drive.  Mood swings.  Headaches.  Tiredness.  Irritability.  Memory problems.  Insomnia.  Choosing to treat or not to treat menopausal changes is an individual decision that you make with your health care provider. What should I know about hormone replacement therapy and supplements? Hormone therapy products are effective for treating symptoms that are associated with menopause, such as hot flashes and night sweats. Hormone replacement carries certain risks,  especially as you become older. If you are thinking about using estrogen or estrogen with progestin treatments, discuss the benefits and risks with your health care provider. What should I know about heart disease and stroke? Heart disease, heart attack, and stroke become more likely as you age. This may be due, in part, to the hormonal changes that your body experiences during menopause. These can affect how your body processes dietary fats, triglycerides, and cholesterol. Heart attack and stroke are both medical emergencies. There are many things that you can do to help prevent heart disease and stroke:  Have your blood pressure checked at least every 1-2 years. High blood pressure causes heart disease and increases the risk of stroke.  If you are 29-65 years old, ask your health care provider if you should take aspirin to prevent a heart attack or a stroke.  Do not use any tobacco products, including cigarettes, chewing tobacco, or electronic cigarettes. If you need help quitting, ask your health care provider.  It is important to  eat a healthy diet and maintain a healthy weight. ? Be sure to include plenty of vegetables, fruits, low-fat dairy products, and lean protein. ? Avoid eating foods that are high in solid fats, added sugars, or salt (sodium).  Get regular exercise. This is one of the most important things that you can do for your health. ? Try to exercise for at least 150 minutes each week. The type of exercise that you do should increase your heart rate and make you sweat. This is known as moderate-intensity exercise. ? Try to do strengthening exercises at least twice each week. Do these in addition to the moderate-intensity exercise.  Know your numbers.Ask your health care provider to check your cholesterol and your blood glucose. Continue to have your blood tested as directed by your health care provider.  What should I know about cancer screening? There are several types of  cancer. Take the following steps to reduce your risk and to catch any cancer development as early as possible. Breast Cancer  Practice breast self-awareness. ? This means understanding how your breasts normally appear and feel. ? It also means doing regular breast self-exams. Let your health care provider know about any changes, no matter how small.  If you are 35 or older, have a clinician do a breast exam (clinical breast exam or CBE) every year. Depending on your age, family history, and medical history, it may be recommended that you also have a yearly breast X-ray (mammogram).  If you have a family history of breast cancer, talk with your health care provider about genetic screening.  If you are at high risk for breast cancer, talk with your health care provider about having an MRI and a mammogram every year.  Breast cancer (BRCA) gene test is recommended for women who have family members with BRCA-related cancers. Results of the assessment will determine the need for genetic counseling and BRCA1 and for BRCA2 testing. BRCA-related cancers include these types: ? Breast. This occurs in males or females. ? Ovarian. ? Tubal. This may also be called fallopian tube cancer. ? Cancer of the abdominal or pelvic lining (peritoneal cancer). ? Prostate. ? Pancreatic.  Cervical, Uterine, and Ovarian Cancer Your health care provider may recommend that you be screened regularly for cancer of the pelvic organs. These include your ovaries, uterus, and vagina. This screening involves a pelvic exam, which includes checking for microscopic changes to the surface of your cervix (Pap test).  For women ages 21-65, health care providers may recommend a pelvic exam and a Pap test every three years. For women ages 47-65, they may recommend the Pap test and pelvic exam, combined with testing for human papilloma virus (HPV), every five years. Some types of HPV increase your risk of cervical cancer. Testing for HPV  may also be done on women of any age who have unclear Pap test results.  Other health care providers may not recommend any screening for nonpregnant women who are considered low risk for pelvic cancer and have no symptoms. Ask your health care provider if a screening pelvic exam is right for you.  If you have had past treatment for cervical cancer or a condition that could lead to cancer, you need Pap tests and screening for cancer for at least 20 years after your treatment. If Pap tests have been discontinued for you, your risk factors (such as having a new sexual partner) need to be reassessed to determine if you should start having screenings again. Some women have  medical problems that increase the chance of getting cervical cancer. In these cases, your health care provider may recommend that you have screening and Pap tests more often.  If you have a family history of uterine cancer or ovarian cancer, talk with your health care provider about genetic screening.  If you have vaginal bleeding after reaching menopause, tell your health care provider.  There are currently no reliable tests available to screen for ovarian cancer.  Lung Cancer Lung cancer screening is recommended for adults 72-78 years old who are at high risk for lung cancer because of a history of smoking. A yearly low-dose CT scan of the lungs is recommended if you:  Currently smoke.  Have a history of at least 30 pack-years of smoking and you currently smoke or have quit within the past 15 years. A pack-year is smoking an average of one pack of cigarettes per day for one year.  Yearly screening should:  Continue until it has been 15 years since you quit.  Stop if you develop a health problem that would prevent you from having lung cancer treatment.  Colorectal Cancer  This type of cancer can be detected and can often be prevented.  Routine colorectal cancer screening usually begins at age 69 and continues through age  43.  If you have risk factors for colon cancer, your health care provider may recommend that you be screened at an earlier age.  If you have a family history of colorectal cancer, talk with your health care provider about genetic screening.  Your health care provider may also recommend using home test kits to check for hidden blood in your stool.  A small camera at the end of a tube can be used to examine your colon directly (sigmoidoscopy or colonoscopy). This is done to check for the earliest forms of colorectal cancer.  Direct examination of the colon should be repeated every 5-10 years until age 71. However, if early forms of precancerous polyps or small growths are found or if you have a family history or genetic risk for colorectal cancer, you may need to be screened more often.  Skin Cancer  Check your skin from head to toe regularly.  Monitor any moles. Be sure to tell your health care provider: ? About any new moles or changes in moles, especially if there is a change in a mole's shape or color. ? If you have a mole that is larger than the size of a pencil eraser.  If any of your family members has a history of skin cancer, especially at a Richy Spradley age, talk with your health care provider about genetic screening.  Always use sunscreen. Apply sunscreen liberally and repeatedly throughout the day.  Whenever you are outside, protect yourself by wearing long sleeves, pants, a wide-brimmed hat, and sunglasses.  What should I know about osteoporosis? Osteoporosis is a condition in which bone destruction happens more quickly than new bone creation. After menopause, you may be at an increased risk for osteoporosis. To help prevent osteoporosis or the bone fractures that can happen because of osteoporosis, the following is recommended:  If you are 46-61 years old, get at least 1,000 mg of calcium and at least 600 mg of vitamin D per day.  If you are older than age 31 but younger than age  40, get at least 1,200 mg of calcium and at least 600 mg of vitamin D per day.  If you are older than age 35, get at least 26,200  mg of calcium and at least 800 mg of vitamin D per day.  Smoking and excessive alcohol intake increase the risk of osteoporosis. Eat foods that are rich in calcium and vitamin D, and do weight-bearing exercises several times each week as directed by your health care provider. What should I know about how menopause affects my mental health? Depression may occur at any age, but it is more common as you become older. Common symptoms of depression include:  Low or sad mood.  Changes in sleep patterns.  Changes in appetite or eating patterns.  Feeling an overall lack of motivation or enjoyment of activities that you previously enjoyed.  Frequent crying spells.  Talk with your health care provider if you think that you are experiencing depression. What should I know about immunizations? It is important that you get and maintain your immunizations. These include:  Tetanus, diphtheria, and pertussis (Tdap) booster vaccine.  Influenza every year before the flu season begins.  Pneumonia vaccine.  Shingles vaccine.  Your health care provider may also recommend other immunizations. This information is not intended to replace advice given to you by your health care provider. Make sure you discuss any questions you have with your health care provider. Document Released: 09/18/2005 Document Revised: 02/14/2016 Document Reviewed: 04/30/2015 Elsevier Interactive Patient Education  2018 [employer].

## 2017-08-18 ENCOUNTER — Ambulatory Visit (HOSPITAL_COMMUNITY): Payer: PRIVATE HEALTH INSURANCE | Admitting: Licensed Clinical Social Worker

## 2017-08-18 ENCOUNTER — Ambulatory Visit (INDEPENDENT_AMBULATORY_CARE_PROVIDER_SITE_OTHER): Payer: PRIVATE HEALTH INSURANCE | Admitting: Licensed Clinical Social Worker

## 2017-08-18 DIAGNOSIS — F411 Generalized anxiety disorder: Secondary | ICD-10-CM

## 2017-08-18 DIAGNOSIS — F332 Major depressive disorder, recurrent severe without psychotic features: Secondary | ICD-10-CM

## 2017-08-18 LAB — PAP, TP IMAGING W/ HPV RNA, RFLX HPV TYPE 16,18/45: HPV DNA HIGH RISK: NOT DETECTED

## 2017-08-18 LAB — URINALYSIS W MICROSCOPIC + REFLEX CULTURE
BACTERIA UA: NONE SEEN /HPF
Bilirubin Urine: NEGATIVE
GLUCOSE, UA: NEGATIVE
HYALINE CAST: NONE SEEN /LPF
Hgb urine dipstick: NEGATIVE
Leukocyte Esterase: NEGATIVE
Nitrites, Initial: NEGATIVE
PROTEIN: NEGATIVE
RBC / HPF: NONE SEEN /HPF (ref 0–2)
Specific Gravity, Urine: 1.023 (ref 1.001–1.03)
pH: <=5 (ref 5.0–8.0)

## 2017-08-18 LAB — NO CULTURE INDICATED

## 2017-08-18 NOTE — Progress Notes (Signed)
   THERAPIST PROGRESS NOTE  Session Time: 1:10pm-2:20pm  Participation Level: Active  Behavioral Response: Tearful throughout the session  At one point was rocking back and forth with her hands covering her face and saying "I'm just so tired.  I've been pretending I'm OK when I'm not."    Type of Therapy: Individual Therapy  Treatment Goals addressed: Reduce symptoms of depression and anxiety to improve daily functioning  Interventions: Encouraging honest expression of thoughts and feelings  Suicidal/Homicidal: Denied both  Therapist Interventions:   Discussed how patient has been trying very hard to convey to the people in her life that she is mentally stable, but she has reached a point where doing that is too difficult.  Acknowledged it is exhausting to pretend everything is OK.  Discussed how she feels as though others rely on her too much.  This takes away from her ability to properly care for herself.  Congratulated patient for allowing herself to be emotionally vulnerable during the session.  Encouraged her to give herself permission to stop pretending she is OK outside of session.   Explored motivation to return to work.  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  She is easily irritated by even small things.  Reports "I'm scared that my manager or a customer will say the wrong thing that will cause me to step out of character and I'll lash out and be fired."    The anniversary of her dad's death is coming up in a couple weeks.  This is always a difficult time when she tends to feel more depressed.  Says she would prefer to return to work around February 1st.  Believes that the chances of distressing emotions  to be triggered will be diminished after this anniversary.       Plan: Will offer suggestions of coping strategies to employ at work if distressing feelings are triggered.  Diagnosis:  MDD, recurrent, severe without psychotic features                      GAD                     R/O PTSD    Marilu FavreSolomon, Sarah A, LCSW 08/18/2017

## 2017-08-20 ENCOUNTER — Encounter: Payer: Self-pay | Admitting: Physician Assistant

## 2017-08-21 ENCOUNTER — Other Ambulatory Visit: Payer: Self-pay | Admitting: Physician Assistant

## 2017-08-21 DIAGNOSIS — E1122 Type 2 diabetes mellitus with diabetic chronic kidney disease: Secondary | ICD-10-CM

## 2017-08-21 DIAGNOSIS — Z794 Long term (current) use of insulin: Principal | ICD-10-CM

## 2017-08-23 ENCOUNTER — Encounter: Payer: Self-pay | Admitting: Physician Assistant

## 2017-08-23 ENCOUNTER — Encounter: Payer: Self-pay | Admitting: Internal Medicine

## 2017-08-23 ENCOUNTER — Ambulatory Visit (HOSPITAL_COMMUNITY): Payer: Self-pay | Admitting: Psychiatry

## 2017-08-23 DIAGNOSIS — E1122 Type 2 diabetes mellitus with diabetic chronic kidney disease: Secondary | ICD-10-CM

## 2017-08-23 DIAGNOSIS — Z794 Long term (current) use of insulin: Principal | ICD-10-CM

## 2017-08-23 MED ORDER — DULAGLUTIDE 1.5 MG/0.5ML ~~LOC~~ SOAJ: 1.5000 mg | SUBCUTANEOUS | 1 refills | Status: DC

## 2017-08-26 ENCOUNTER — Ambulatory Visit (HOSPITAL_COMMUNITY): Payer: PRIVATE HEALTH INSURANCE | Admitting: Licensed Clinical Social Worker

## 2017-08-31 ENCOUNTER — Telehealth (HOSPITAL_COMMUNITY): Payer: Self-pay | Admitting: Licensed Clinical Social Worker

## 2017-08-31 NOTE — Telephone Encounter (Signed)
Therapist spoke with Shanell from [employer].  She denied calling earlier in the day requesting to speak with therapist.  Reported that the claim had been closed and patient was approved to go back to work by her physician, Tandy GawJade Breeback on 08/24/17.      Therapist called patient back to relay this information to her.  Patient said she would investigate further.  Therapist invited patient to call back at her convenience to develop plans for her continued MH treatment.

## 2017-08-31 NOTE — Telephone Encounter (Signed)
Therapist returned call from patient.  Patient was in a state of distress.  Reported she had a bad week.  Today was the first day she had gotten out of bed in several days.  Reported she missed her therapy appointment because she had chosen to disconnect from everyone.  Said she turned off her phone.  Reported having chronic panic symptoms-pain in her chest, having trouble breathing.  Expressed hopelessness about being able to handle returning to work.  Assuming she will be fired.  Distraught about the fact that she has no money in her bank account.  Did manage to get her medications with financial help from her daughter.  Therapist asked if she was experiencing suicidal ideation.  She denied having thoughts of self-harm.   Patient requested therapist speak to her caseworker for her disability claim.  Therapist informed her that she had a message stating the caseworker had called and that she intended to return that call.  Agreed to call back patient after speaking to the caseworker.

## 2017-08-31 NOTE — Telephone Encounter (Signed)
Jasmine Jasmine [name] called back with Jasmine [name] on the phone. Jasmine [name] states she has not spoke with Jasmine [name] today.   Please call Jasmine [name] back and she will then again try to get Jasmine [name] back on the phone.

## 2017-08-31 NOTE — Telephone Encounter (Signed)
Shanell from [employer] called to discuss case with sarah. Please cb at [phone]

## 2017-09-01 NOTE — Telephone Encounter (Signed)
Therapist returned call from patient.  Apparently she did not speak to the correct person yesterday.  Developed a plan for patient to contact Jasmine [name] and inform her of therapist's availability later today between 3pm and 4pm.

## 2017-09-03 ENCOUNTER — Telehealth (HOSPITAL_COMMUNITY): Payer: Self-pay | Admitting: Licensed Clinical Social Worker

## 2017-09-03 NOTE — Telephone Encounter (Signed)
Therapist called patient to get an update on her condition.  She reported having multiple panic attacks on a daily basis.  She said "Yesterday I considered going to the ER because it felt like I was having a heart attack."  Reported having ongoing feelings of hopelessness, headaches, and crying spells.  She is only eating once a day.  Decided to do a PHQ9 over the phone.  Rated all items but one as bothering her nearly every day in the past two weeks.  Admitted to having vague suicidal ideation a few times.  Denied having a plan or intent.  Score on PHQ9=25  So depression is severe at this time.  Discussed need for patient to participate in a higher level of care.  Recommended [facility].  Provided patient with the name of one of the therapists who runs the program along with a phone number.  Patient agreed to treatment recommendations.

## 2017-09-05 ENCOUNTER — Other Ambulatory Visit: Payer: Self-pay | Admitting: Physician Assistant

## 2017-09-05 DIAGNOSIS — I10 Essential (primary) hypertension: Secondary | ICD-10-CM

## 2017-09-06 ENCOUNTER — Telehealth (HOSPITAL_COMMUNITY): Payer: Self-pay | Admitting: Professional

## 2017-09-07 ENCOUNTER — Ambulatory Visit (INDEPENDENT_AMBULATORY_CARE_PROVIDER_SITE_OTHER): Payer: PRIVATE HEALTH INSURANCE | Admitting: Licensed Clinical Social Worker

## 2017-09-07 DIAGNOSIS — F411 Generalized anxiety disorder: Secondary | ICD-10-CM

## 2017-09-07 DIAGNOSIS — F332 Major depressive disorder, recurrent severe without psychotic features: Secondary | ICD-10-CM

## 2017-09-07 NOTE — Progress Notes (Signed)
   THERAPIST PROGRESS NOTE  Session Time: 3:13pm-3:59pm  Participation Level: Active  Behavioral Response: Patient appeared restless.  Her leg was shaking throughout the session.  Her speech was broken, as if it was hard for her to gets words out  Her facial expression was frowning.  Tearful at one point.  Appeared visibly agitated as she spoke about feeling hopeless, very expressive with her hands and arms    Disheveled-wearing sweat pants, this is different than her usual neat presentation    Type of Therapy: Family therapy (her sister Okey DupreRose joined the session, patient signed a consent form giving permission to share PHI)  Treatment Goals addressed: Reduce symptoms of depression and anxiety to improve daily functioning  Interventions: Assessment, Encouraging honest expression of thoughts and feelings  Suicidal/Homicidal: Denied both  Therapist Interventions:   Assessed patient's current status.  Gathered information from patient's sister about what she has observed about patient's condition over time.   Agreed to speak with [facility] from [employer] at patient's request.  Called during the session. Provided her with a verbal description of patient's presentation. Recommended patient go ahead and schedule an assessment for the Partial Hospitalization Program.       Summary:  Patient reported "I'm at my wits end.  Nobody is cooperating.  I feel hopeless!"  Her sister reported she has been staying in bed most of the time.  Patient said today was the first day she got dressed in several days.  Reported having constant panic symptoms.  Complained of chest pain.  Went to the ER on Saturday concerned about her level of anxiety.  She ended up leaving without being assessed because the wait was too long.  Indicated she has been ruminating on worries that she will lose her job and her home.   [facility] told therapist she would relay the information about patient's presentation to the  person who reviews claims.   Sister reported patient's condition seemed to improve somewhat during the time she was participating in the [facility].  Said at that time patient expressed hopeful thoughts about the future.  Reported patient's condition has declined in the past couple months.  Said she is supportive of patient participating in a higher level of care.       Plan: Plans to follow through with recommendation for Partial Hospitalization Program.  Agreed to call to schedule an assessment.    Diagnosis:  MDD, recurrent, severe without psychotic features                      GAD                     R/O PTSD    Marilu FavreSolomon, Trease [place] A, LCSW 09/07/2017

## 2017-09-09 ENCOUNTER — Ambulatory Visit (INDEPENDENT_AMBULATORY_CARE_PROVIDER_SITE_OTHER): Payer: 59 | Admitting: Internal Medicine

## 2017-09-09 ENCOUNTER — Encounter: Payer: Self-pay | Admitting: Internal Medicine

## 2017-09-09 VITALS — BP 140/88 | HR 96 | Ht 62.0 in | Wt 269.6 lb

## 2017-09-09 DIAGNOSIS — E1169 Type 2 diabetes mellitus with other specified complication: Secondary | ICD-10-CM

## 2017-09-09 DIAGNOSIS — Z794 Long term (current) use of insulin: Secondary | ICD-10-CM

## 2017-09-09 DIAGNOSIS — E785 Hyperlipidemia, unspecified: Secondary | ICD-10-CM | POA: Diagnosis not present

## 2017-09-09 DIAGNOSIS — E1122 Type 2 diabetes mellitus with diabetic chronic kidney disease: Secondary | ICD-10-CM | POA: Diagnosis not present

## 2017-09-09 LAB — LIPID PANEL
CHOL/HDL RATIO: 4
CHOLESTEROL: 179 mg/dL (ref 0–200)
HDL: 44.4 mg/dL (ref >39.00)
NONHDL: 134.57
Triglycerides: 207 mg/dL — ABNORMAL HIGH (ref 0.0–149.0)
VLDL: 41.4 mg/dL — AB (ref 0.0–40.0)

## 2017-09-09 LAB — BASIC METABOLIC PANEL WITH GFR
BUN / CREAT RATIO: 7 (calc) (ref 6–22)
BUN: 11 mg/dL (ref 7–25)
CO2: 25 mmol/L (ref 20–32)
CREATININE: 1.5 mg/dL — AB (ref 0.50–1.05)
Calcium: 9.9 mg/dL (ref 8.6–10.4)
Chloride: 107 mmol/L (ref 98–110)
GFR, EST AFRICAN AMERICAN: 47 mL/min/{1.73_m2} — AB (ref >=60)
GFR, EST NON AFRICAN AMERICAN: 40 mL/min/{1.73_m2} — AB (ref >=60)
GLUCOSE: 96 mg/dL (ref 65–99)
Potassium: 4.8 mmol/L (ref 3.5–5.3)
SODIUM: 140 mmol/L (ref 135–146)

## 2017-09-09 LAB — LDL CHOLESTEROL, DIRECT: LDL DIRECT: 103 mg/dL

## 2017-09-09 NOTE — Progress Notes (Signed)
Patient ID: Jasmine CardYolanda E Lancour, female   DOB: 1966-04-09, 52 y.o.   MRN: [id number]   HPI: Jasmine [name] is a 52 y.o.-year-old female, returning for f/u for DM2, dx in 2014, but had GDM in 1992, insulin-dependent, uncontrolled, with complications (CKD, DR).  She has been seen previously by Dr. Everardo AllEllison, first visit with me 2 mo ago.  She c/o depression >> not very compliant with her medication doses or diet. Her wellbutrin dose was increased to 300 mg daily.  Last hemoglobin A1c was: Lab Results  Component Value Date   HGBA1C 8.4 07/12/2017   HGBA1C 7.1 04/07/2017   HGBA1C 7.8 01/22/2017   Pt was on a regimen of: - Tresiba 18 units at night (increased from 10 to 18 units 3 weeks ago) - Trulicity 1.5 mg weekly - Sat. She stopped Farxiga 10 mg daily in am in 04/2017 >> repeated yeast inf's She was on Novolog 2 units 3x a day, before meals - started 03/2017, stopped 05/2017.  At last visit, we changed to: - Farxiga 10 mg daily in am - off and on -no yeast infections since she restarted. - Trulicity 1.5 mg weekly  - Tresiba 20 units daily  Pt checks her sugars 0-1x a day: - am: 170s >> 130s - 2h after b'fast: n/c >> 130-160s - before lunch: n/c - 2h after lunch: n/c - before dinner: 170s >> n/c - 2h after dinner: n/c - bedtime: 170's-180 >> 130-209 - nighttime: n/c Lowest sugar was 80 (did not eat), OTW 132 >> 130; she has hypoglycemia awareness in the 80s. Highest sugar was 186 >> 275.  Glucometer: FreeStyle  Pt's meals are: - Breakfast: coffee or PB sandwich + coffee - Lunch: chicken, veggies - Dinner: bowl of cereal or skips - Snacks: many: M and M, granola bar, trailmix Drinks sweet tea.  - + CKD, last BUN/creatinine:  Lab Results  Component Value Date   BUN 13 11/26/2016   BUN 11 08/19/2016   CREATININE 1.16 (H) 11/26/2016   CREATININE 1.16 (H) 08/19/2016  On Losartan. - + HL; last set of lipids: Lab Results  Component Value Date   CHOL 198 06/16/2016   HDL 54  06/16/2016   LDLCALC 116 (H) 06/16/2016   TRIG 142 06/16/2016   CHOLHDL 3.7 06/16/2016  On Zocor. - last eye exam was in 02/2016: + DR - No numbness and tingling in her feet. L ankle and L hand numbness.  Pt has FH of DM in "everybody" - both sides of family: M, F, S's, B's, aunts.  ROS: Constitutional: + weight gain/no weight loss, no fatigue, + hot flashes, + nocturia Eyes: + Blurry vision, no xerophthalmia ENT: no sore throat, no nodules palpated in throat, no dysphagia, no odynophagia, no hoarseness Cardiovascular: no CP/no SOB/no palpitations/no leg swelling Respiratory: no cough/no SOB/no wheezing Gastrointestinal: + N/no V/no D/+ C/+ acid reflux Musculoskeletal: + Muscle aches/+ joint aches Skin: no rashes, no hair loss Neurological: no tremors/no numbness/no tingling/no dizziness  I reviewed pt's medications, allergies, PMH, social hx, family hx, and changes were documented in the history of present illness. Otherwise, unchanged from my initial visit note.  Past Medical History:  Diagnosis Date  . Anxiety   . Arthritis    "knees" (03/28/2014)  . Chronic renal insufficiency    stage II (mild)  . Depression   . Headache    with high blood sugar  . Hypercholesteremia   . Hypertension   . Numbness and tingling of right arm  and leg   . Seasonal allergies   . Type II diabetes mellitus (HCC)   . Umbilical hernia without mention of obstruction or gangrene   . Wears glasses    Past Surgical History:  Procedure Laterality Date  . INSERTION OF MESH N/A 03/28/2014   Procedure: INSERTION OF MESH;  Surgeon: Axel Filler, MD;  Location: [facility] OR;  Service: General;  Laterality: N/A;  . INSERTION OF MESH N/A 05/08/2015   Procedure: INSERTION OF MESH;  Surgeon: Axel Filler, MD;  Location: [facility] OR;  Service: General;  Laterality: N/A;  . LAPAROSCOPIC LYSIS OF ADHESIONS N/A 05/08/2015   Procedure: LAPAROSCOPIC LYSIS OF ADHESIONS;  Surgeon: Axel Filler, MD;  Location: [facility] OR;   Service: General;  Laterality: N/A;  . MULTIPLE TOOTH EXTRACTIONS  ~ 2003  . TONSILLECTOMY    . UMBILICAL HERNIA REPAIR  1973  . VENTRAL HERNIA REPAIR N/A 03/28/2014   Procedure: LAPAROSCOPIC VENTRAL HERNIA;  Surgeon: Axel Filler, MD;  Location: [facility] OR;  Service: General;  Laterality: N/A;  . VENTRAL HERNIA REPAIR N/A 05/08/2015   Procedure: LAPAROSCOPIC VENTRAL HERNIA REPAIR WITH MESH;  Surgeon: Axel Filler, MD;  Location: [facility] OR;  Service: General;  Laterality: N/A;   Social History   Socioeconomic History  . Marital status: Married    Spouse name: Not on file  . Number of children: Not on file  . Years of education: Not on file  . Highest education level: Not on file  Social Needs  . Financial resource strain: Not on file  . Food insecurity - worry: Not on file  . Food insecurity - inability: Not on file  . Transportation needs - medical: Not on file  . Transportation needs - non-medical: Not on file  Occupational History  . Not on file  Tobacco Use  . Smoking status: Current Some Day Smoker    Packs/day: 0.25    Years: 26.00    Pack years: 6.50    Types: Cigarettes  . Smokeless tobacco: Never Used  . Tobacco comment: down to 1 cigarette a week  Substance and Sexual Activity  . Alcohol use: No    Comment: 03/28/2014 "have a drink a few times/year"  . Drug use: No  . Sexual activity: Not Currently  Other Topics Concern  . Not on file  Social History Narrative  . Not on file   Current Outpatient Medications on File Prior to Visit  Medication Sig Dispense Refill  . buPROPion (WELLBUTRIN XL) 300 MG 24 hr tablet Take 1 tablet (300 mg total) by mouth daily. 30 tablet 1  . cetirizine (ZYRTEC) 10 MG tablet Take 10 mg by mouth daily as needed for allergies.    . dapagliflozin propanediol (FARXIGA) 10 MG TABS tablet Take 10 mg by mouth daily. 90 tablet 1  . Dulaglutide (TRULICITY) 1.5 MG/0.5ML SOPN Inject 1.5 mg into the skin once a week. 12 pen 1  . fluticasone (FLONASE)  50 MCG/ACT nasal spray Place 2 sprays into both nostrils daily. 16 g 5  . glucose blood test strip One Touch Ultra meter, test strips & lancets . Check blood sugar 3 times daily. Diagnosis - Diabetes ICD 10 - E 11.22 100 each 11  . losartan-hydrochlorothiazide (HYZAAR) 50-12.5 MG tablet Take 1 tablet by mouth daily. Must make appointment before any further refills for Blood Pressure 30 tablet 0  . omeprazole (PRILOSEC) 40 MG capsule Take 1 capsule (40 mg total) by mouth daily. 30 capsule 3  . sertraline (ZOLOFT) 100 MG  tablet Take one and one/half tablet daily. 135 tablet 0  . simvastatin (ZOCOR) 10 MG tablet TAKE 1 TABLET (10 MG TOTAL) DAILY BY MOUTH. NEED LAB WORK FOR FUTURE REFILLS. 15 tablet 0  . traMADol (ULTRAM) 50 MG tablet Take 1 tablet (50 mg total) by mouth every 8 (eight) hours as needed. 30 tablet 0  . TRESIBA FLEXTOUCH 100 UNIT/ML SOPN FlexTouch Pen INJECT 0.2ML INTO THE SKIN AT 10PM. INCREASE BY 2UNITS EVERY 3 DAYS TIL FASTING IS LESS THAN 120 30 pen 2  . TROKENDI XR 50 MG CP24 TAKE 1 CAPSULE BY MOUTH EVERY DAY 90 capsule 0  . [DISCONTINUED] DULoxetine (CYMBALTA) 20 MG capsule Take 1 capsule (20 mg total) by mouth daily. 90 capsule 1   No current facility-administered medications on file prior to visit.    Allergies  Allergen Reactions  . Eggs Or Egg-Derived Products Hives and Other (See Comments)    FLU VACCINE: caused hives    . Influenza Vaccines Hives    Vaccine from Egg derived product causes Hives  . Effexor [Venlafaxine]     "felt weird"  . Metformin And Related Swelling    ANGIOEDEMA   Family History  Problem Relation Age of Onset  . Diabetes Mother   . Diabetes Father   . Cancer Father        Prostate cancer  . Cancer Sister        Cervical cancer  . Diabetes Brother   . Bipolar disorder Brother   . Alcohol abuse Brother   . Drug abuse Brother   . Diabetes Sister   . Diabetes Brother     PE: BP 140/88   Pulse 96   Ht 5\' 2"  (1.575 m)   Wt 269 lb 9.6  oz (122.3 kg)   LMP 01/24/2015 Comment: perimenopausal  SpO2 98%   BMI 49.31 kg/m  Wt Readings from Last 3 Encounters:  09/09/17 269 lb 9.6 oz (122.3 kg)  08/17/17 267 lb (121.1 kg)  07/12/17 264 lb 3.2 oz (119.8 kg)   Constitutional: obese, in NAD Eyes: PERRLA, EOMI, no exophthalmos ENT: moist mucous membranes, no thyromegaly, no cervical lymphadenopathy Cardiovascular: tachycardia, RR, No MRG Respiratory: CTA B Gastrointestinal: abdomen soft, NT, ND, BS+ Musculoskeletal: no deformities, strength intact in all 4 Skin: moist, warm, no rashes Neurological: no tremor with outstretched hands, DTR normal in all 4  ASSESSMENT: 1. DM2, insulin-dependent, uncontrolled, with complications - CKD stage 2 - DR  2. HL  PLAN:  1. Patient with long-standing, uncontrolled, type 2 diabetes, on oral antidiabetic regimen + GLP-1 receptor agonist + basal insulin.  At last visit,we increased [place] and added back [place].  Unfortunately, she was more depressed in the last 1-2 months so she was not very compliant with her medication doses and also was not moving much and had dietary indiscretions. - We will not change medication regimen at this visit, but advised her to take the doses consistently.  Also discussed about improving diet. - at last visit, and HbA1c was higher, at 8.4%.  We will recheck this at next visit - will check a BMP as she is now on Farxiga - She did not have yeast infections since she started [employer], but she did not take this consistently >> If she does get the infection again, we can start intravaginal terconazole.  She will let me know. - I suggested to:  Patient Instructions  Please continue: - Farxiga 10 mg daily in am - Trulicity 1.5 mg weekly  -  Tresiba 20 units daily  Please stop at the lab.  Please get a new eye exam.  Please return in 3 months with your sugar log.   - continue checking sugars at different times of the day - check 1x a day, rotating  checks - advised for yearly eye exams >> she is not UTD >> advised to schedule - Return to clinic in 3 mo with sugar log   2. HL - Reviewed Lipid levels from 06/2016 >> LDL above goal - We will obtain a new lipid panel - nonfasting - continues Zocor w/o SEs Component     Latest Ref Rng & Units 09/09/2017  Glucose     65 - 99 mg/dL 96  BUN     7 - 25 mg/dL 11  Creatinine     4.09 - 1.05 mg/dL 8.11 (H)  GFR, Est Non African American     > OR = 60 mL/min/1.51m2 40 (L)  GFR, Est African American     > OR = 60 mL/min/1.63m2 47 (L)  BUN/Creatinine Ratio     6 - 22 (calc) 7  Sodium     135 - 146 mmol/L 140  Potassium     3.5 - 5.3 mmol/L 4.8  Chloride     98 - 110 mmol/L 107  CO2     20 - 32 mmol/L 25  Calcium     8.6 - 10.4 mg/dL 9.9  Cholesterol     0 - 200 mg/dL 914  Triglycerides     0.0 - 149.0 mg/dL 782.9 (H)  HDL Cholesterol     >39.00 mg/dL 56.21  VLDL     0.0 - 30.8 mg/dL 65.7 (H)  Total CHOL/HDL Ratio      4  NonHDL      134.57  Direct LDL     mg/dL 846.9    Lipids showing a slightly better LDL >> continue Zocor. Kidney fxn a little low >> will advised her to stay well  hydrated and will recheck BMP at next visit.  Carlus Pavlov, MD PhD [facility] Endocrinology

## 2017-09-09 NOTE — Patient Instructions (Addendum)
Please continue: - Farxiga 10 mg daily in am - Trulicity 1.5 mg weekly  - Tresiba 20 units daily  Please stop at the lab.  Please get a new eye exam.  Please return in 3 months with your sugar log.

## 2017-09-10 ENCOUNTER — Telehealth (HOSPITAL_COMMUNITY): Payer: Self-pay | Admitting: Professional

## 2017-09-10 ENCOUNTER — Telehealth (HOSPITAL_COMMUNITY): Payer: Self-pay | Admitting: Psychiatry

## 2017-09-10 NOTE — Telephone Encounter (Signed)
D:  [facility] Dominic [facility]a([name], LCSW); treatment for worsening depression and anxiety.  Pt didn't meet the criteria for PHP therefore she is inquiring about returning to [facility].  Pt was just recently discharged from IOP in November 2018.  "I was doing great, but then I got back into this slump when it was time to return to work."  According to pt, she never returned back to work after completing IOP.  A:  Will discuss case with treatment team due to recent discharge.  R:  Pt receptive.     Jeri Modenaita Aleda Madl, M.Ed, CNA

## 2017-09-15 ENCOUNTER — Encounter (HOSPITAL_COMMUNITY): Payer: Self-pay | Admitting: Psychiatry

## 2017-09-15 ENCOUNTER — Other Ambulatory Visit (HOSPITAL_COMMUNITY): Payer: PRIVATE HEALTH INSURANCE | Attending: Psychiatry | Admitting: Psychiatry

## 2017-09-15 DIAGNOSIS — F322 Major depressive disorder, single episode, severe without psychotic features: Secondary | ICD-10-CM | POA: Diagnosis not present

## 2017-09-15 DIAGNOSIS — I10 Essential (primary) hypertension: Secondary | ICD-10-CM | POA: Insufficient documentation

## 2017-09-15 DIAGNOSIS — F332 Major depressive disorder, recurrent severe without psychotic features: Secondary | ICD-10-CM | POA: Diagnosis present

## 2017-09-15 DIAGNOSIS — F419 Anxiety disorder, unspecified: Secondary | ICD-10-CM | POA: Insufficient documentation

## 2017-09-15 DIAGNOSIS — Z79899 Other long term (current) drug therapy: Secondary | ICD-10-CM | POA: Diagnosis not present

## 2017-09-15 DIAGNOSIS — F17210 Nicotine dependence, cigarettes, uncomplicated: Secondary | ICD-10-CM | POA: Diagnosis not present

## 2017-09-15 DIAGNOSIS — Z7951 Long term (current) use of inhaled steroids: Secondary | ICD-10-CM | POA: Diagnosis not present

## 2017-09-15 DIAGNOSIS — E1122 Type 2 diabetes mellitus with diabetic chronic kidney disease: Secondary | ICD-10-CM | POA: Insufficient documentation

## 2017-09-15 DIAGNOSIS — E669 Obesity, unspecified: Secondary | ICD-10-CM | POA: Insufficient documentation

## 2017-09-15 DIAGNOSIS — N182 Chronic kidney disease, stage 2 (mild): Secondary | ICD-10-CM | POA: Insufficient documentation

## 2017-09-15 DIAGNOSIS — E7800 Pure hypercholesterolemia, unspecified: Secondary | ICD-10-CM | POA: Diagnosis not present

## 2017-09-15 MED ORDER — LAMOTRIGINE 25 MG PO TABS: ORAL_TABLET | ORAL | 0 refills | Status: DC

## 2017-09-15 NOTE — Progress Notes (Signed)
Psychiatric Initial Adult Assessment   Patient Identification: Jasmine [name] MRN:  [id number] Date of Evaluation:  09/15/2017 Referral Source: Her therapist Jasmine [name]  Chief Complaint:  I am not doing good.  Visit Diagnosis:    ICD-10-CM   1. Severe major depression, single episode, without psychotic features (HCC) F32.2 lamoTRIgine (LAMICTAL) 25 MG tablet    History of Present Illness: Jasmine [name] is 52 year old African-American married, employed female who is referred from her therapist for intensive outpatient program.  Patient done intensive outpatient program in October 2018.  She is an employee at [employer] for 19 years.  Her biggest concern is her job situation.  She has been not back to work since October.  She endorsed that she has been billing by her supervisor.  Her eyes are very sensitive due to long-standing diabetes.  She needs large glasses and bigger computer screen to work and her supervisor sometimes past comments that she felt offended.  Her other stressors are husband not bring money.  He works out of town and patient believes he may be using drugs.  Patient is seeing Dr. Kandice [name] in [place] and prescribed Wellbutrin and Zoloft.  Recently his Wellbutrin dose is increased but patient do not see any improvement in her depression.  She admitted fatigue, lack of motivation, poor sleep, crying spells, sadness, social isolation, withdrawn and not finding pleasure in anything.  She admitted stays to herself most of the time.  She lives with her 36 year old daughter and 15-year-old granddaughter.  Her sister lives in the town who is also very supportive.  Patient admitted easily fatigued, lack of energy, lack of attention and extreme irritability and mood swings.  She also endorsed burst of energy where she feel very happy but that did not last more than a few hours.  She admitted extreme mood swing, frustration and sometimes having thoughts of hurting people but she has no target.  She  admitted having road rage and impulsive behavior.  Patient has no tremors, shakes or any EPS.  She has diabetes, chronic pain, headache, chronic renal insufficiency, hyper cholesterolemia, hypertension, numbness and tingling in her arms and legs.  Her last hemoglobin A1c was 8.4 which was done in December 2018.  Plan to try mood stabilizer to help her irritability mood swing and anger and impulsive behavior.  Denies drinking alcohol or using any illegal substances.  Associated Signs/Symptoms: Depression Symptoms:  depressed mood, insomnia, psychomotor agitation, fatigue, feelings of worthlessness/guilt, difficulty concentrating, hopelessness, anxiety, loss of energy/fatigue, disturbed sleep, (Hypo) Manic Symptoms:  Elevated Mood, Impulsivity, Irritable Mood, Labiality of Mood, Anxiety Symptoms:  Excessive Worry, Psychotic Symptoms:  No psychotic symptoms. PTSD Symptoms: Negative  Past Psychiatric History: Patient denies any history of psychiatric inpatient treatment, suicidal attempt, self abusive behavior, traumatic brain injury or any history of abuse.  She started seeing primary care physician for further management of depression.  She had given Zoloft and later Wellbutrin added.  She is seeing Dr. Kandice [name] and Jasmine [name] in [place] office.  Previous Psychotropic Medications: Yes   Substance Abuse History in the last 12 months:  No.  Consequences of Substance Abuse: Negative  Past Medical History:  Past Medical History:  Diagnosis Date  . Anxiety   . Arthritis    "knees" (03/28/2014)  . Chronic renal insufficiency    stage II (mild)  . Depression   . Headache    with high blood sugar  . Hypercholesteremia   . Hypertension   . Numbness and tingling of right arm  and leg   . Seasonal allergies   . Type II diabetes mellitus (HCC)   . Umbilical hernia without mention of obstruction or gangrene   . Wears glasses     Past Surgical History:  Procedure Laterality Date   . INSERTION OF MESH N/A 03/28/2014   Procedure: INSERTION OF MESH;  Surgeon: Jasmine Filler, MD;  Location: [facility] OR;  Service: General;  Laterality: N/A;  . INSERTION OF MESH N/A 05/08/2015   Procedure: INSERTION OF MESH;  Surgeon: Jasmine Filler, MD;  Location: [facility] OR;  Service: General;  Laterality: N/A;  . LAPAROSCOPIC LYSIS OF ADHESIONS N/A 05/08/2015   Procedure: LAPAROSCOPIC LYSIS OF ADHESIONS;  Surgeon: Jasmine Filler, MD;  Location: [facility] OR;  Service: General;  Laterality: N/A;  . MULTIPLE TOOTH EXTRACTIONS  ~ 2003  . TONSILLECTOMY    . UMBILICAL HERNIA REPAIR  1973  . VENTRAL HERNIA REPAIR N/A 03/28/2014   Procedure: LAPAROSCOPIC VENTRAL HERNIA;  Surgeon: Jasmine Filler, MD;  Location: [facility] OR;  Service: General;  Laterality: N/A;  . VENTRAL HERNIA REPAIR N/A 05/08/2015   Procedure: LAPAROSCOPIC VENTRAL HERNIA REPAIR WITH MESH;  Surgeon: Jasmine Filler, MD;  Location: [facility] OR;  Service: General;  Laterality: N/A;    Family Psychiatric History: Viewed  Family History:  Family History  Problem Relation Age of Onset  . Diabetes Mother   . Diabetes Father   . Cancer Father        Prostate cancer  . Cancer Sister        Cervical cancer  . Diabetes Brother   . Bipolar disorder Brother   . Alcohol abuse Brother   . Drug abuse Brother   . Diabetes Sister   . Diabetes Brother     Social History:   Social History   Socioeconomic History  . Marital status: Married    Spouse name: Not on file  . Number of children: Not on file  . Years of education: Not on file  . Highest education level: Not on file  Social Needs  . Financial resource strain: Not on file  . Food insecurity - worry: Not on file  . Food insecurity - inability: Not on file  . Transportation needs - medical: Not on file  . Transportation needs - non-medical: Not on file  Occupational History  . Not on file  Tobacco Use  . Smoking status: Current Some Day Smoker    Packs/day: 0.25    Years: 26.00    Pack  years: 6.50    Types: Cigarettes  . Smokeless tobacco: Never Used  . Tobacco comment: down to 1 cigarette a week  Substance and Sexual Activity  . Alcohol use: No    Comment: 03/28/2014 "have a drink a few times/year"  . Drug use: No  . Sexual activity: Not Currently  Other Topics Concern  . Not on file  Social History Narrative  . Not on file    Additional Social History: Patient born in [place].  She grew up there.  Her parents were living separated.  Her father was murdered in 2002 when she moved to [place] to facilitate his case.  3 years ago her nephew killed in a motor vehicle accident.  Patient is married for 19 years.  She has 1 daughter from her previous relationship who lives with her.  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  Allergies:   Allergies  Allergen Reactions  . Eggs Or Egg-Derived [employer] and Other (  See Comments)    FLU VACCINE: caused hives    . Influenza Vaccines Hives    Vaccine from Egg derived product causes Hives  . Effexor [Venlafaxine]     "felt weird"  . Metformin And Related Swelling    ANGIOEDEMA    Metabolic Disorder Labs: Lab Results  Component Value Date   HGBA1C 8.4 07/12/2017   MPG 384 (H) 01/09/2015   MPG 160 (H) 03/28/2014   No results found for: PROLACTIN Lab Results  Component Value Date   CHOL 179 09/09/2017   TRIG 207.0 (H) 09/09/2017   HDL 44.40 09/09/2017   CHOLHDL 4 09/09/2017   VLDL 41.4 (H) 09/09/2017   LDLCALC 116 (H) 06/16/2016   LDLCALC 91 11/27/2013     Current Medications: Current Outpatient Medications  Medication Sig Dispense Refill  . buPROPion (WELLBUTRIN XL) 300 MG 24 hr tablet Take 1 tablet (300 mg total) by mouth daily. 30 tablet 1  . cetirizine (ZYRTEC) 10 MG tablet Take 10 mg by mouth daily as needed for allergies.    . dapagliflozin propanediol (FARXIGA) 10 MG TABS tablet Take 10 mg by mouth daily. 90 tablet 1  . Dulaglutide (TRULICITY) 1.5 MG/0.5ML SOPN Inject  1.5 mg into the skin once a week. 12 pen 1  . fluticasone (FLONASE) 50 MCG/ACT nasal spray Place 2 sprays into both nostrils daily. 16 g 5  . glucose blood test strip One Touch Ultra meter, test strips & lancets . Check blood sugar 3 times daily. Diagnosis - Diabetes ICD 10 - E 11.22 100 each 11  . lamoTRIgine (LAMICTAL) 25 MG tablet Take 1 tab daily for 1 week and than 2 tab daily 60 tablet 0  . losartan-hydrochlorothiazide (HYZAAR) 50-12.5 MG tablet Take 1 tablet by mouth daily. Must make appointment before any further refills for Blood Pressure 30 tablet 0  . omeprazole (PRILOSEC) 40 MG capsule Take 1 capsule (40 mg total) by mouth daily. 30 capsule 3  . sertraline (ZOLOFT) 100 MG tablet Take one and one/half tablet daily. 135 tablet 0  . simvastatin (ZOCOR) 10 MG tablet TAKE 1 TABLET (10 MG TOTAL) DAILY BY MOUTH. NEED LAB WORK FOR FUTURE REFILLS. 15 tablet 0  . traMADol (ULTRAM) 50 MG tablet Take 1 tablet (50 mg total) by mouth every 8 (eight) hours as needed. 30 tablet 0  . TRESIBA FLEXTOUCH 100 UNIT/ML SOPN FlexTouch Pen INJECT 0.2ML INTO THE SKIN AT 10PM. INCREASE BY 2UNITS EVERY 3 DAYS TIL FASTING IS LESS THAN 120 30 pen 2  . TROKENDI XR 50 MG CP24 TAKE 1 CAPSULE BY MOUTH EVERY DAY 90 capsule 0   No current facility-administered medications for this visit.     Neurologic: Headache: No Seizure: No Paresthesias:Yes  Musculoskeletal: Strength & Muscle Tone: within normal limits Gait & Station: normal Patient leans: N/A  Psychiatric Specialty Exam: ROS  Last menstrual period 01/24/2015.There is no height or weight on file to calculate BMI.  General Appearance: Casual and Obese  Eye Contact:  Fair  Speech:  Slow  Volume:  Decreased  Mood:  Depressed and Dysphoric  Affect:  Constricted and Depressed  Thought Process:  Goal Directed  Orientation:  Full (Time, Place, and Person)  Thought Content:  Rumination  Suicidal Thoughts:  No  Homicidal Thoughts:  No  Memory:  Immediate;    Fair Recent;   Fair Remote;   Fair  Judgement:  Good  Insight:  Good  Psychomotor Activity:  Decreased  Concentration:  Concentration: Fair and Attention  Span: Fair  Recall:  [employer]  Fund of Knowledge:Good  Language: Good  Akathisia:  No  Handed:  Right  AIMS (if indicated):  0  Assets:  Communication Skills Desire for Improvement Housing  ADL's:  Intact  Cognition: WNL  Sleep:  fair    Treatment Plan Summary: Major depressive disorder, recurrent moderate.  Rule out bipolar disorder depressed type.  Generalized anxiety disorder. Plan Admit to intensive outpatient program.  Review psychosocial history, collect information, blood work results and current medication. Patient currently taking Zoloft 150 mg daily and Wellbutrin 300 mg daily.  She still have mood lability, irritability mood swing.  Recommended to add low-dose Lamictal to help above symptoms.  Encouraged to participate in group milieu therapy.  Encouraged to work on her coping skills.  Discussed exploring the option to go back to work as a part-time.  Cleotis NipperSyed T Jearlean Demauro, MD 2/6/20199:45 AM

## 2017-09-15 NOTE — Progress Notes (Signed)
  Jasmine [name] is a 52 y.o. , married, employed, [place] female, who was referred per therapist Rolly Salter([facility] for worsening depressive and anxiety symptoms with passive SI.  Denies a plan or intent.  Pt is well known to this writer due to recent admit in IOP (October 2018).  [facility]; but pt didn't meet criteria for that level of care.  Denies HI or A/V hallucinations. Hx of depression, sixteen yrs ago whenever her father passed. Denied any past psychiatric hospitalizations or suicide attempts or gestures. States she and [facility] are working on a plan for her to return back to work. 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 According to pt, things has become worse since getting a Writernew manager in September 2018. "In September I was written up twice in one day. I've been called into the office four times since September." Pt states that her manager is a bully. Pt states she feels that she is not ready to return to work.  Hasn't worked since May 15, 2017.  Pt states she has been looking at other job options.  2) Medical: Pt has Diabetes (Dx three yrs ago). Pt was out of work in March-April 2018 for six weeks d/t Diabetes. 3) Husband of 19 yrs: Pt had been suspecting that her husband had relapsed on drugs d/t not having extra money. States that he admitted that he was using and he mentioned that he wouldn't use again; but according to pt he continues to use.  Pt encouraged him to seek therapy, but he hasn't.  4) Unresolved grief/loss issues: Father died ~ 17 yrs ago. Pt states she was very close to hr father. Nephew died in July 2018.  States she's been having dreams about him.Family Hx: (Brother) Bipolar D/O, ETOH and drugs. A:  Re-oriented pt to MH-IOP.  Provided pt with Vocational Rehab information.  Strongly recommended that she make an appointment.  Encouraged support groups.  Informed Dr.  Gilmore LarocheAkhtar and Rolly SalterSara Solomon, LCSW of admit.  R:  Pt receptive.          Chestine SporeLARK, [name], M.Ed, CNA

## 2017-09-16 ENCOUNTER — Other Ambulatory Visit (HOSPITAL_COMMUNITY): Payer: PRIVATE HEALTH INSURANCE | Admitting: Psychiatry

## 2017-09-16 DIAGNOSIS — F322 Major depressive disorder, single episode, severe without psychotic features: Secondary | ICD-10-CM

## 2017-09-17 ENCOUNTER — Other Ambulatory Visit (HOSPITAL_COMMUNITY): Payer: PRIVATE HEALTH INSURANCE | Admitting: Psychiatry

## 2017-09-17 DIAGNOSIS — F322 Major depressive disorder, single episode, severe without psychotic features: Secondary | ICD-10-CM | POA: Diagnosis not present

## 2017-09-20 ENCOUNTER — Other Ambulatory Visit (HOSPITAL_COMMUNITY): Payer: PRIVATE HEALTH INSURANCE

## 2017-09-20 NOTE — Progress Notes (Signed)
    Daily Group Progress Note  Program: IOP  Group Time: 9:00-12:00  Participation Level: Active  Behavioral Response: Appropriate  Type of Therapy:  Group Therapy  Summary of Progress: The theme for group today was a continuation of yesterday's session of "Practice positive thoughts and letting go of things that don't serve us as we uncover the core issues."  The therapist facilitated the session by allowing the group members who didn't have the opportunity to share their thoughts yesterday on how the topic resonated with them as they reflected on what other members shared previously.  The group discussed the importance of setting boundaries and sitting with the discomfort of setting boundaries.  Information about general wellness, breathing and meditation exercise for relaxation and mindfulness was given. The patient shared with group that she's tired of been taken advantage of by family members.  She trying to figure out a replacement behavior in absence of giving everything she has. Her kindness has turned to weakness by not knowing when to say "No."  Learning to set her boundaries to distance herself from certain family members and not feel guilty about it is what she needs to work on. The patient was active in group and share a lot of insights and support to group members.  Her behavior response was appropriate.  [name], Jennifer B, LPC

## 2017-09-20 NOTE — Progress Notes (Signed)
    Daily Group Progress Note  Program: IOP  Group Time: 9:00-12:00  Participation Level: Active  Behavioral Response: Appropriate  Type of Therapy:  Group Therapy  Summary of Progress: Group session was focused on themes involving self-acceptance. A few group members labeled how they were feeling today. Session also focused on acknowledging how the past affects their present moment in life. The mental health association also provided a brief overview of their program to group members followed by a guided yoga exercise from another counselor. Patient provided a good amount of active feedback towards other members of the group. Also provided empathy and understanding towards members dealing with self-acceptance and depression. Stated, "it's easier said than done," in regards to over coming self-defeating thoughts of acceptance. Patient also speaks briefly about its hard to overcome certain obstacles when you are depressed. At the end, she mentioned the guided yoga session as being very calming.     [name], Jennifer B, LPC

## 2017-09-20 NOTE — Progress Notes (Signed)
    Daily Group Progress Note  Program: IOP  Group Time: 9:00-12:00  Participation Level: Active  Behavioral Response: Appropriate  Type of Therapy:  Group Therapy   Summary of Progress: The theme for group today was a continuation relating to "What balance looks like for you in your life?" The therapist facilitated with the idea of how "Making a tiny shift daily can make a massive change," relating back to vision and balance.  The patient is new to group.  She shared with group that she is stressed and frustrated by her job.  She also feels overwhelmed by family problems.  Her balance is to find what's best for her, "What's her why?"  She's making some changes to do things for herself rather than counting on others to do for her. The patient is active and supportive of group members.  Her behavioral response was appropriate.       [name], Jasmine [name], LPC

## 2017-09-21 ENCOUNTER — Telehealth (HOSPITAL_COMMUNITY): Payer: Self-pay | Admitting: Psychiatry

## 2017-09-21 ENCOUNTER — Other Ambulatory Visit (HOSPITAL_COMMUNITY): Payer: PRIVATE HEALTH INSURANCE

## 2017-09-21 NOTE — Telephone Encounter (Signed)
D:  Pt had called writer stating that she wouldn't be attending MH-IOP again today due to issues with the new medication (Lamictal).  According to pt, she is having migraines, sedation during the day and decreased glucose (62-72).  Pt states she won't be attending IOP tomorrow either due to appt with Dr. Gilmore LarocheAkhtar.  Discussed with Dr. Lolly MustacheArfeen.  Per Dr. Lolly MustacheArfeen he wants pt to continue taking the Lamictal, but only stop if a rash appears.  Pt denies a rash at this time.  He also is instructing pt to see Dr. Gilmore LarocheAkhtar as scheduled tomorrow.  A:  Placed call to pt to inform her of Dr. Sheela StackArfeen's recommendations.  R:  Pt receptive.    Jeri Modenaita Clark, M.Ed, CNA

## 2017-09-22 ENCOUNTER — Encounter (HOSPITAL_COMMUNITY): Payer: Self-pay | Admitting: Psychiatry

## 2017-09-22 ENCOUNTER — Other Ambulatory Visit (HOSPITAL_COMMUNITY): Payer: PRIVATE HEALTH INSURANCE

## 2017-09-22 ENCOUNTER — Ambulatory Visit (HOSPITAL_COMMUNITY): Payer: Self-pay | Admitting: Psychiatry

## 2017-09-22 ENCOUNTER — Ambulatory Visit (INDEPENDENT_AMBULATORY_CARE_PROVIDER_SITE_OTHER): Payer: PRIVATE HEALTH INSURANCE | Admitting: Psychiatry

## 2017-09-22 VITALS — BP 130/82 | HR 93 | Ht 62.0 in | Wt 266.0 lb

## 2017-09-22 DIAGNOSIS — F17210 Nicotine dependence, cigarettes, uncomplicated: Secondary | ICD-10-CM | POA: Diagnosis not present

## 2017-09-22 DIAGNOSIS — Z811 Family history of alcohol abuse and dependence: Secondary | ICD-10-CM | POA: Diagnosis not present

## 2017-09-22 DIAGNOSIS — Z818 Family history of other mental and behavioral disorders: Secondary | ICD-10-CM | POA: Diagnosis not present

## 2017-09-22 DIAGNOSIS — Z813 Family history of other psychoactive substance abuse and dependence: Secondary | ICD-10-CM

## 2017-09-22 DIAGNOSIS — F411 Generalized anxiety disorder: Secondary | ICD-10-CM | POA: Diagnosis not present

## 2017-09-22 DIAGNOSIS — F322 Major depressive disorder, single episode, severe without psychotic features: Secondary | ICD-10-CM | POA: Diagnosis not present

## 2017-09-22 DIAGNOSIS — Z915 Personal history of self-harm: Secondary | ICD-10-CM | POA: Diagnosis not present

## 2017-09-22 NOTE — Progress Notes (Signed)
[facility] Outpatient Follow up visit   Patient Identification: Jasmine [name] MRN:  [id number] Date of Evaluation:  09/22/2017 Referral Source: Her therapist Dominic Pea  Chief Complaint:   Chief Complaint    Follow-up; Other    I am not doing good.  Visit Diagnosis:    ICD-10-CM   1. 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 Generalized anxiety disorder F41.1   3. Anxiety state F41.1     History of Present Illness: Jasmine [name] is 52 year old African-American married, She is part of IOP now and returns for follow up  .  She is an employee at [employer] for 19 years.  Her biggest concern is her job situation.  She has been not back to work since October.  She endorsed that she has been billing by her supervisor.  Her eyes are very sensitive due to long-standing diabetes.  She needs large glasses and bigger computer screen to work and her supervisor sometimes past comments that she felt offended.  Her other stressors are husband not bring money.  He works out of town and patient believes he may be using drugs.   She has been on zoloft and wellbutrin. Recently started lamictal at iop due to history of mood swings and irritability On a low dose but felt her sugar was runnning low so we will keep 25mg  and not increase for now  Aggravating factor: job harrassement and stress of thinking of going back to wor  Modifying factor: limited    Past Psychiatric History: Patient denies any history of psychiatric inpatient treatment, suicidal attempt, self abusive behavior, traumatic brain injury or any history of abuse.  Previous Psychotropic Medications: Yes   Substance Abuse History in the last 12 months:  No.  Consequences of Substance Abuse: Negative  Past Medical History:  Past Medical History:  Diagnosis Date  . Anxiety   . Arthritis    "knees" (03/28/2014)  . Chronic renal insufficiency    stage II (mild)  . Depression   . Headache    with high  blood sugar  . Hypercholesteremia   . Hypertension   . Numbness and tingling of right arm and leg   . Seasonal allergies   . Type II diabetes mellitus (HCC)   . Umbilical hernia without mention of obstruction or gangrene   . Wears glasses     Past Surgical History:  Procedure Laterality Date  . INSERTION OF MESH N/A 03/28/2014   Procedure: INSERTION OF MESH;  Surgeon: Axel Filler, MD;  Location: [facility] OR;  Service: General;  Laterality: N/A;  . INSERTION OF MESH N/A 05/08/2015   Procedure: INSERTION OF MESH;  Surgeon: Axel Filler, MD;  Location: [facility] OR;  Service: General;  Laterality: N/A;  . LAPAROSCOPIC LYSIS OF ADHESIONS N/A 05/08/2015   Procedure: LAPAROSCOPIC LYSIS OF ADHESIONS;  Surgeon: Axel Filler, MD;  Location: [facility] OR;  Service: General;  Laterality: N/A;  . MULTIPLE TOOTH EXTRACTIONS  ~ 2003  . TONSILLECTOMY    . UMBILICAL HERNIA REPAIR  1973  . VENTRAL HERNIA REPAIR N/A 03/28/2014   Procedure: LAPAROSCOPIC VENTRAL HERNIA;  Surgeon: Axel Filler, MD;  Location: [facility] OR;  Service: General;  Laterality: N/A;  . VENTRAL HERNIA REPAIR N/A 05/08/2015   Procedure: LAPAROSCOPIC VENTRAL HERNIA REPAIR WITH MESH;  Surgeon: Axel Filler, MD;  Location: [facility] OR;  Service: General;  Laterality: N/A;    Family Psychiatric History: Viewed  Family History:  Family History  Problem Relation Age of Onset  .  Diabetes Mother   . Diabetes Father   . Cancer Father        Prostate cancer  . Cancer Sister        Cervical cancer  . Diabetes Brother   . Bipolar disorder Brother   . Alcohol abuse Brother   . Drug abuse Brother   . Diabetes Sister   . Diabetes Brother     Social History:   Social History   Socioeconomic History  . Marital status: Married    Spouse name: None  . Number of children: None  . Years of education: None  . Highest education level: None  Social Needs  . Financial resource strain: None  . Food insecurity - worry: None  . Food insecurity -  inability: None  . Transportation needs - medical: None  . Transportation needs - non-medical: None  Occupational History  . None  Tobacco Use  . Smoking status: Current Some Day Smoker    Packs/day: 0.25    Years: 26.00    Pack years: 6.50    Types: Cigarettes  . Smokeless tobacco: Never Used  . Tobacco comment: down to 1 cigarette a week  Substance and Sexual Activity  . Alcohol use: No    Comment: 03/28/2014 "have a drink a few times/year"  . Drug use: No  . Sexual activity: Not Currently  Other Topics Concern  . None  Social History Narrative  . None     Allergies:   Allergies  Allergen Reactions  . Eggs Or Egg-Derived Products Hives and Other (See Comments)    FLU VACCINE: caused hives    . Influenza Vaccines Hives    Vaccine from Egg derived product causes Hives  . Effexor [Venlafaxine]     "felt weird"  . Metformin And Related Swelling    ANGIOEDEMA    Metabolic Disorder Labs: Lab Results  Component Value Date   HGBA1C 8.4 07/12/2017   MPG 384 (H) 01/09/2015   MPG 160 (H) 03/28/2014   No results found for: PROLACTIN Lab Results  Component Value Date   CHOL 179 09/09/2017   TRIG 207.0 (H) 09/09/2017   HDL 44.40 09/09/2017   CHOLHDL 4 09/09/2017   VLDL 41.4 (H) 09/09/2017   LDLCALC 116 (H) 06/16/2016   LDLCALC 91 11/27/2013     Current Medications: Current Outpatient Medications  Medication Sig Dispense Refill  . buPROPion (WELLBUTRIN XL) 300 MG 24 hr tablet Take 1 tablet (300 mg total) by mouth daily. 30 tablet 1  . cetirizine (ZYRTEC) 10 MG tablet Take 10 mg by mouth daily as needed for allergies.    . dapagliflozin propanediol (FARXIGA) 10 MG TABS tablet Take 10 mg by mouth daily. 90 tablet 1  . Dulaglutide (TRULICITY) 1.5 MG/0.5ML SOPN Inject 1.5 mg into the skin once a week. 12 pen 1  . fluticasone (FLONASE) 50 MCG/ACT nasal spray Place 2 sprays into both nostrils daily. 16 g 5  . glucose blood test strip One Touch Ultra meter, test strips &  lancets . Check blood sugar 3 times daily. Diagnosis - Diabetes ICD 10 - E 11.22 100 each 11  . lamoTRIgine (LAMICTAL) 25 MG tablet Take 1 tab daily for 1 week and than 2 tab daily 60 tablet 0  . losartan-hydrochlorothiazide (HYZAAR) 50-12.5 MG tablet Take 1 tablet by mouth daily. Must make appointment before any further refills for Blood Pressure 30 tablet 0  . omeprazole (PRILOSEC) 40 MG capsule Take 1 capsule (40 mg total) by mouth daily.  30 capsule 3  . sertraline (ZOLOFT) 100 MG tablet Take one and one/half tablet daily. 135 tablet 0  . simvastatin (ZOCOR) 10 MG tablet TAKE 1 TABLET (10 MG TOTAL) DAILY BY MOUTH. NEED LAB WORK FOR FUTURE REFILLS. 15 tablet 0  . traMADol (ULTRAM) 50 MG tablet Take 1 tablet (50 mg total) by mouth every 8 (eight) hours as needed. 30 tablet 0  . TRESIBA FLEXTOUCH 100 UNIT/ML SOPN FlexTouch Pen INJECT 0.2ML INTO THE SKIN AT 10PM. INCREASE BY 2UNITS EVERY 3 DAYS TIL FASTING IS LESS THAN 120 30 pen 2  . TROKENDI XR 50 MG CP24 TAKE 1 CAPSULE BY MOUTH EVERY DAY 90 capsule 0   No current facility-administered medications for this visit.       Psychiatric Specialty Exam: ROS  Blood pressure 130/82, pulse 93, height 5\' 2"  (1.575 m), weight 266 lb (120.7 kg), last menstrual period 01/24/2015.Body mass index is 48.65 kg/m.  General Appearance: Casual and Obese  Eye Contact:  Fair  Speech:  Slow  Volume:  Decreased  Mood:  subdued  Affect: constricted  Thought Process:  Goal Directed  Orientation:  Full (Time, Place, and Person)  Thought Content:  Rumination  Suicidal Thoughts:  No  Homicidal Thoughts:  No  Memory:  Immediate;   Fair Recent;   Fair Remote;   Fair  Judgement:  Good  Insight:  Good  Psychomotor Activity:  Decreased  Concentration:  Concentration: Fair and Attention Span: Fair  Recall:  Good  Fund of Knowledge:Good  Language: Good  Akathisia:  No  Handed:  Right  AIMS (if indicated):  0  Assets:  Communication Skills Desire for  Improvement Housing  ADL's:  Intact  Cognition: WNL  Sleep:  fair    Treatment Plan Summary: Major depressive disorder, recurrent moderate.  Rule out bipolar disorder depressed type.  Generalized anxiety disorder. Continue meds  Do not increase lamictl for now as her concern of blood glucose low levels at time Understands what  To do if hypoglycemic and follow up with pcp  Continue wellbutrin  Panic/ GAD" ongoing when thinks of job.  May join after IOP but may need accomodation or limited hours or part time to transition in  Continue zoloft  has refills.   Continue IOP helping in coping but still freshly started to work on anxiety and panic  Questions addressed. FU4 weeks here or may join [place] [facility] for MM  can continue therapy with Breck Coons, MD 2/13/20191:47 PM

## 2017-09-23 ENCOUNTER — Other Ambulatory Visit (HOSPITAL_COMMUNITY): Payer: PRIVATE HEALTH INSURANCE | Admitting: Psychiatry

## 2017-09-23 ENCOUNTER — Encounter (INDEPENDENT_AMBULATORY_CARE_PROVIDER_SITE_OTHER): Payer: 59

## 2017-09-23 DIAGNOSIS — F322 Major depressive disorder, single episode, severe without psychotic features: Secondary | ICD-10-CM

## 2017-09-24 ENCOUNTER — Other Ambulatory Visit (HOSPITAL_COMMUNITY): Payer: PRIVATE HEALTH INSURANCE | Admitting: Psychiatry

## 2017-09-24 DIAGNOSIS — F322 Major depressive disorder, single episode, severe without psychotic features: Secondary | ICD-10-CM | POA: Diagnosis not present

## 2017-09-24 NOTE — Progress Notes (Signed)
    Daily Group Progress Note  Program: IOP  Group Time: 9:00-12:00  Participation Level: Active  Behavioral Response: Appropriate  Type of Therapy:  Group Therapy  Summary of Progress: Group session was focused on themes involving past family trauma. Session also focused on checking in with members on how their progress has been going. Members discussed how past events affect current behaviors and also touched up on the topics of codependency and provided feedback towards each other throughout the session. The second half of the session involved group members discussing topics of where they are now and where they would like to be in the future. Pt. Shared her frustration and anxiety about the status of her short term disability claim and communication with her HR department.     [name], Jennifer B, LPC

## 2017-09-24 NOTE — Progress Notes (Signed)
    Daily Group Progress Note  Program: IOP  Group Time: 9:00-12:00  Participation Level: Active  Behavioral Response: Appropriate  Type of Therapy:  Group Therapy  Summary of Progress: Pt. Presented with bright affect, talkative, engaged in the group process. Pt. Shared that she had a good evening last night and made a meal for her family to celebrate Valentine's day before she started to feel sick and went to bed. Pt. Discussed that she is communicating with her HR about her short term disability that makes her feel anxious. Pt. Participated in aromatherapy workshop as complementary therapy for anxiety and depression.     [name], Jennifer B, LPC

## 2017-09-27 ENCOUNTER — Other Ambulatory Visit (HOSPITAL_COMMUNITY): Payer: PRIVATE HEALTH INSURANCE | Admitting: Psychiatry

## 2017-09-27 DIAGNOSIS — F322 Major depressive disorder, single episode, severe without psychotic features: Secondary | ICD-10-CM | POA: Diagnosis not present

## 2017-09-27 NOTE — Progress Notes (Signed)
Patient ID: Jasmine [name], female   DOB: [birth year], 52 y.o.   MRN: [id number]  Ms Jasmine [name] reported a rash around her mouth on 15 Feb when I was not here.  The Lamictal was discontinued and she has had no further rash.  Plan is to try without adding another medication if possible unless she complains about mood lability and wants to try something else.  Would consider an atypical then.

## 2017-09-28 ENCOUNTER — Other Ambulatory Visit (HOSPITAL_COMMUNITY): Payer: PRIVATE HEALTH INSURANCE

## 2017-09-28 NOTE — Progress Notes (Signed)
    Daily Group Progress Note  Program: IOP  Group Time: 9:00-12:00 Participation Level: Active Behavioral Response: Appropriate and sharing Type of Therapy:  Group Summary of Progress: The theme for group today was "patients' perceptions of obstacles and building awareness of self- expression."  The therapist facilitated the session by posing the question, "If you had a Super Power, what would it be?"  The therapist modeled that she would be a "Super Book" to retain as much information as she can just by skimming through the book without having to read the entire book for the details. The patients expressed that she would do anything to avoid selling anything.  She loves customer service, but she doesn't want to have anything to do with forcing people to buy things that they don't need. She likes to make everyone happy. Collection would be her best option.The patient was very active in group.  She offered great insights to group and her behavioral response was appropriate. [name], Jennifer B, LPC

## 2017-09-29 ENCOUNTER — Other Ambulatory Visit (HOSPITAL_COMMUNITY): Payer: PRIVATE HEALTH INSURANCE | Admitting: Psychiatry

## 2017-09-29 DIAGNOSIS — F322 Major depressive disorder, single episode, severe without psychotic features: Secondary | ICD-10-CM

## 2017-09-30 ENCOUNTER — Other Ambulatory Visit (HOSPITAL_BASED_OUTPATIENT_CLINIC_OR_DEPARTMENT_OTHER): Payer: PRIVATE HEALTH INSURANCE | Admitting: Psychiatry

## 2017-09-30 ENCOUNTER — Encounter (INDEPENDENT_AMBULATORY_CARE_PROVIDER_SITE_OTHER): Payer: 59

## 2017-09-30 DIAGNOSIS — F322 Major depressive disorder, single episode, severe without psychotic features: Secondary | ICD-10-CM

## 2017-10-01 ENCOUNTER — Other Ambulatory Visit (HOSPITAL_COMMUNITY): Payer: PRIVATE HEALTH INSURANCE | Admitting: Psychiatry

## 2017-10-01 DIAGNOSIS — F322 Major depressive disorder, single episode, severe without psychotic features: Secondary | ICD-10-CM

## 2017-10-01 NOTE — Progress Notes (Signed)
    Daily Group Progress Note  Program: IOP   Group Time: 9:00-12:00 Participation Level: Active, Sharing Behavioral Response: Sharing, Appropriate Type of Therapy:  Group Summary of Progress: Group session was focused on themes involving letting go of expectations and being open towards new life experiences. Group session also focused on embracing changes in daily life routines. Group members provided feedback towards each other throughout the session as well. Patient reported that he felt okay. Pt. reported having trouble sleeping the night before. Pt. Also expressed interest in adding aroma therapy into her daily regimen. Patient was open to feedback from the group in reference to implementing daily exercises of relaxation. She expressed apprehension about having a set routine but she is open to the idea of taking baby steps and beginning her exercises in the morning.  [name], Autymn Omlor B, LPC

## 2017-10-01 NOTE — Progress Notes (Signed)
    Daily Group Progress Note  Program: IOP  Group Time: 9:00-12:00 Participation Level: Active Behavioral Response: Appropriate and sharing Type of Therapy:  Group Summary of Progress: The theme in group today was, "Learning how to challenge yourself talk - inner critique."  The therapist emphasized on the awareness of the positive voice to push through the obstacles rather than giving up. The patient shared with group that she was feeling anxious and realized that it has to do with the doctor's appointment that she has this afternoon.  She has difficulty meeting people or dealing with new situations.  She also has difficulty talking to her therapist openly and struggles to express her true feelings.  She also has difficulty communicating what she expects from her nephew who has been living with her and hasn't been contributing to the household expenses as she needs him to.  Her goal is to work on rebuilding herself and be assertive to say what she needs to say when she needs to. The patient was active in group.  She offered a lot of insights to group members.  Her behavioral response was appropriate.   [name], Jennifer B, LPC

## 2017-10-04 ENCOUNTER — Encounter: Payer: Self-pay | Admitting: Internal Medicine

## 2017-10-04 ENCOUNTER — Other Ambulatory Visit: Payer: Self-pay | Admitting: Physician Assistant

## 2017-10-04 ENCOUNTER — Other Ambulatory Visit (HOSPITAL_COMMUNITY): Payer: PRIVATE HEALTH INSURANCE | Admitting: Psychiatry

## 2017-10-04 DIAGNOSIS — F322 Major depressive disorder, single episode, severe without psychotic features: Secondary | ICD-10-CM

## 2017-10-04 LAB — HM DIABETES EYE EXAM

## 2017-10-04 NOTE — Progress Notes (Signed)
    Daily Group Progress Note  Program: IOP  Group Time: 9:00-12:00 Participation Level: Active Behavioral Response: Appropriate and sharing Type of Therapy:  Group Summary of Progress: The theme for group was, "Dealing with job related stress to focus on what really matters."  The therapist facilitated the session by stressing on the importance of using the effective coping skills to control stressful situation that are triggers for anxiety and panic attacks.  Being aware of the common signs of stress and applying the stress relief activities to take care of yourself so things don't escalate. The second part of group was on grief.  The speaker, Marilynne Driverslaine M. [facility](Ellie) McFalls facilitated the session with, "What to Say and What Not to Say to Someone Who's Grieving."  She defined grief and clarified some of the misconceptions about grief.  How grief affects us and the importance of allowing ourselves to grieve.  There is not wrong way to grieve and there are no stages of grief, but the stage you are in. The patient stated that she was fine.  She has experienced job-related stress that triggers her anxiety.  She uses the stress relief activities to focus on what matters to her.  She felt good about her paperwork being re-submitted today.  During the grief session, she expressed her concerns about her sister and how she's coping with the loss of her nephew who passed away two years ago. The patient was active in group.  She shared a lot of insights with group.  Her behavioral response was appropriate.  [name], Jennifer B, LPC

## 2017-10-05 ENCOUNTER — Other Ambulatory Visit (HOSPITAL_COMMUNITY): Payer: PRIVATE HEALTH INSURANCE | Admitting: Psychiatry

## 2017-10-05 DIAGNOSIS — F322 Major depressive disorder, single episode, severe without psychotic features: Secondary | ICD-10-CM | POA: Diagnosis not present

## 2017-10-06 ENCOUNTER — Other Ambulatory Visit (HOSPITAL_COMMUNITY): Payer: PRIVATE HEALTH INSURANCE | Admitting: Psychiatry

## 2017-10-06 DIAGNOSIS — F322 Major depressive disorder, single episode, severe without psychotic features: Secondary | ICD-10-CM

## 2017-10-07 ENCOUNTER — Encounter (INDEPENDENT_AMBULATORY_CARE_PROVIDER_SITE_OTHER): Payer: Self-pay | Admitting: Family Medicine

## 2017-10-07 ENCOUNTER — Ambulatory Visit (INDEPENDENT_AMBULATORY_CARE_PROVIDER_SITE_OTHER): Payer: 59 | Admitting: Family Medicine

## 2017-10-07 VITALS — BP 137/84 | HR 78 | Temp 97.9°F | Ht 62.0 in | Wt 263.0 lb

## 2017-10-07 DIAGNOSIS — Z1331 Encounter for screening for depression: Secondary | ICD-10-CM

## 2017-10-07 DIAGNOSIS — R0602 Shortness of breath: Secondary | ICD-10-CM | POA: Diagnosis not present

## 2017-10-07 DIAGNOSIS — Z9189 Other specified personal risk factors, not elsewhere classified: Secondary | ICD-10-CM

## 2017-10-07 DIAGNOSIS — I10 Essential (primary) hypertension: Secondary | ICD-10-CM | POA: Diagnosis not present

## 2017-10-07 DIAGNOSIS — E119 Type 2 diabetes mellitus without complications: Secondary | ICD-10-CM

## 2017-10-07 DIAGNOSIS — R5383 Other fatigue: Secondary | ICD-10-CM

## 2017-10-07 DIAGNOSIS — Z6841 Body Mass Index (BMI) 40.0 and over, adult: Secondary | ICD-10-CM

## 2017-10-07 DIAGNOSIS — Z0289 Encounter for other administrative examinations: Secondary | ICD-10-CM

## 2017-10-07 NOTE — Progress Notes (Signed)
    Daily Group Progress Note  Program: IOP  Group Time: 9:00-12:00 Participation Level: Active Behavioral Response: Appropriate and sharing Type of Therapy:  Group Summary of Progress: The theme for group today was, "Communicating your feelings and be comfortable with it in the moment."  The therapist discussed the need to be able to know when to say "No" and not feeling guilty or uncomfortable as you move on. The patient expressed that she was fine.  She shared some insights with group members as she related to similar situations that she has experienced.  She reflected on last Thursday and Friday sessions that are really helping her being aware of how to communicate her feelings clearly.  She has been able to set boundaries, communicate her feelings and expectations to her husband effectively.  She's happy about the positive outcome of doing that as she is able to realize that it's lack of communication and not depression that can cause misunderstanding sometimes.  She shared this quote, "Don't let life take you to the bar - bitter, angry and resentful." The patient is very active in group.  She's very supportive and compassionate to group members.  Her behavioral response was appropriate.  [name], Arial Galligan B, LPC

## 2017-10-07 NOTE — Progress Notes (Signed)
    Daily Group Progress Note  Program: IOP Group Time: 9:00-12:00 Participation Level: Active Behavioral Response: Appropriate and sharing Type of Therapy:  Group Summary of Progress: The theme in group today was, "Strengthening assertiveness and modeling behavior to meet your goals."  The therapist facilitated the session by bringing awareness on practicing assertiveness and not feeling sorry for doing so to protect yourself.  The patient expressed that she felt upset and disappointed for violating her own boundary by trying to be nice.  She loaned out money to a friend who doesn't want to pay her back and now she's having some financial hardship and has no money to do what she needs to do.  She's frustrated and blames herself for doing that, but she wants to spend time with her grand-daughter and model for her how to set boundaries and say "NO" to keep the good things in our lives and the bad things out.   She loves her grand-daughter very much and wants to teach her how to set boundaries as early on as possible.  One of her goals this year is to leave her job at [employer].   She plans on doing the certification program at [facility] for Suicide Prevention and become a Audiological scientisteer Mentor.  She wants to use her time to make some life changing transformation to move forward.  She has a Archivistmeeting tomorrow with the EEOC concerning the work related discrimination issue at her job. The patient was very active in group and enjoys group.  Her behavioral response was appropriate.  [name], Lela Gell B, LPC

## 2017-10-08 ENCOUNTER — Other Ambulatory Visit (HOSPITAL_COMMUNITY): Payer: PRIVATE HEALTH INSURANCE | Attending: Psychiatry | Admitting: Psychiatry

## 2017-10-08 DIAGNOSIS — Z79899 Other long term (current) drug therapy: Secondary | ICD-10-CM | POA: Insufficient documentation

## 2017-10-08 DIAGNOSIS — F419 Anxiety disorder, unspecified: Secondary | ICD-10-CM | POA: Insufficient documentation

## 2017-10-08 DIAGNOSIS — F329 Major depressive disorder, single episode, unspecified: Secondary | ICD-10-CM | POA: Insufficient documentation

## 2017-10-08 DIAGNOSIS — E119 Type 2 diabetes mellitus without complications: Secondary | ICD-10-CM | POA: Diagnosis not present

## 2017-10-08 DIAGNOSIS — F322 Major depressive disorder, single episode, severe without psychotic features: Secondary | ICD-10-CM

## 2017-10-08 LAB — COMPREHENSIVE METABOLIC PANEL
ALT: 13 IU/L (ref 0–32)
AST: 11 IU/L (ref 0–40)
Albumin/Globulin Ratio: 1.7 (ref 1.2–2.2)
Albumin: 4.5 g/dL (ref 3.5–5.5)
Alkaline Phosphatase: 73 IU/L (ref 39–117)
BUN/Creatinine Ratio: 9 (ref 9–23)
BUN: 12 mg/dL (ref 6–24)
Bilirubin Total: <0.2 mg/dL (ref 0.0–1.2)
CALCIUM: 10 mg/dL (ref 8.7–10.2)
CO2: 23 mmol/L (ref 20–29)
CREATININE: 1.28 mg/dL — AB (ref 0.57–1.00)
Chloride: 105 mmol/L (ref 96–106)
GFR calc Af Amer: 56 mL/min/{1.73_m2} — ABNORMAL LOW (ref >59)
GFR, EST NON AFRICAN AMERICAN: 49 mL/min/{1.73_m2} — AB (ref >59)
Globulin, Total: 2.7 g/dL (ref 1.5–4.5)
Glucose: 124 mg/dL — ABNORMAL HIGH (ref 65–99)
POTASSIUM: 5 mmol/L (ref 3.5–5.2)
Sodium: 141 mmol/L (ref 134–144)
Total Protein: 7.2 g/dL (ref 6.0–8.5)

## 2017-10-08 LAB — CBC WITH DIFFERENTIAL
BASOS: 1 %
Basophils Absolute: 0.1 10*3/uL (ref 0.0–0.2)
EOS (ABSOLUTE): 0.2 10*3/uL (ref 0.0–0.4)
EOS: 3 %
HEMATOCRIT: 39.5 % (ref 34.0–46.6)
Hemoglobin: 12.8 g/dL (ref 11.1–15.9)
Immature Grans (Abs): 0 10*3/uL (ref 0.0–0.1)
Immature Granulocytes: 0 %
LYMPHS ABS: 2.8 10*3/uL (ref 0.7–3.1)
Lymphs: 55 %
MCH: 23.4 pg — AB (ref 26.6–33.0)
MCHC: 32.4 g/dL (ref 31.5–35.7)
MCV: 72 fL — AB (ref 79–97)
MONOS ABS: 0.3 10*3/uL (ref 0.1–0.9)
Monocytes: 6 %
NEUTROS ABS: 1.8 10*3/uL (ref 1.4–7.0)
Neutrophils: 35 %
RBC: 5.46 x10E6/uL — ABNORMAL HIGH (ref 3.77–5.28)
RDW: 16.5 % — ABNORMAL HIGH (ref 12.3–15.4)
WBC: 5 10*3/uL (ref 3.4–10.8)

## 2017-10-08 LAB — LIPID PANEL WITH LDL/HDL RATIO
Cholesterol, Total: 180 mg/dL (ref 100–199)
HDL: 45 mg/dL (ref >39)
LDL Calculated: 98 mg/dL (ref 0–99)
LDl/HDL Ratio: 2.2 ratio (ref 0.0–3.2)
Triglycerides: 183 mg/dL — ABNORMAL HIGH (ref 0–149)
VLDL Cholesterol Cal: 37 mg/dL (ref 5–40)

## 2017-10-08 LAB — FOLATE: Folate: 13.3 ng/mL (ref >3.0)

## 2017-10-08 LAB — HEMOGLOBIN A1C
ESTIMATED AVERAGE GLUCOSE: 174 mg/dL
Hgb A1c MFr Bld: 7.7 % — ABNORMAL HIGH (ref 4.8–5.6)

## 2017-10-08 LAB — TSH: TSH: 1.23 u[IU]/mL (ref 0.450–4.500)

## 2017-10-08 LAB — T4, FREE: FREE T4: 1.13 ng/dL (ref 0.82–1.77)

## 2017-10-08 LAB — MICROALBUMIN / CREATININE URINE RATIO

## 2017-10-08 LAB — VITAMIN B12: VITAMIN B 12: 249 pg/mL (ref 232–1245)

## 2017-10-08 LAB — INSULIN, RANDOM: INSULIN: 24.5 u[IU]/mL (ref 2.6–24.9)

## 2017-10-08 LAB — T3: T3, Total: 100 ng/dL (ref 71–180)

## 2017-10-08 NOTE — Progress Notes (Signed)
Office: [phone]  /  Fax: [phone]   Dear Jasmine [name],   Thank you for referring Jasmine [name] to our clinic. The following note includes my evaluation and treatment recommendations.  HPI:   Chief Complaint: OBESITY    Jasmine [name] has been referred by Jasmine [name], [name] for consultation regarding her obesity and obesity related comorbidities.    Jasmine [name] (MR# [id number]) is a 52 y.o. female who presents on 10/07/2017 for obesity evaluation and treatment. Current BMI is Body mass index is 48.1 kg/m.Jasmine [name] has been struggling with her weight for many years and has been unsuccessful in either losing weight, maintaining weight loss, or reaching her healthy weight goal.     Jasmine [name] previously lost weight with a change in diet and phentermine, she lost 50 lbs the first time and second time approximately 30 lbs.      Jasmine [name] attended our information session and states she is currently in the action stage of change and ready to dedicate time achieving and maintaining a healthier weight. Jasmine [name] is interested in becoming our patient and working on intensive lifestyle modifications including (but not limited to) diet, exercise and weight loss.    Jasmine [name] states her family eats meals together she thinks her family will eat healthier with  her her desired weight loss is 83 lbs she has been heavy most of  her life she started gaining weight 2015 her heaviest weight ever was 285 lbs she has significant food cravings issues  she snacks frequently in the evenings she skips meals frequently she is frequently drinking liquids with calories she frequently makes poor food choices she frequently eats larger portions than normal  she struggles with emotional eating    Fatigue Jasmine [name] feels her energy is lower than it should be. This has worsened with weight gain and has not worsened recently. Jasmine [name] admits to daytime somnolence and  admits to waking up still  tired. Patient is at risk for obstructive sleep apnea. Patent has a history of symptoms of daytime fatigue. Patient generally gets 3 hours of sleep per night, and states they generally have nightime awakenings. Snoring is present. Apneic episodes are not present. Epworth Sleepiness Score is 13.  Dyspnea on exertion Krina notes increasing shortness of breath with exercising and seems to be worsening over time with weight gain. She notes getting out of breath sooner with activity than she used to. This has not gotten worse recently. Jasmine [name]. Jazzlyn denies orthopnea.  Diabetes II Jasmine [name] has a diagnosis of diabetes type II. Jasmine [name] was diagnosed 4-5 years ago. She is on [place]. She states fasting BGs range in 130's and bedtime BGs range between 159 and 189. Last A1c was 8.4 on 07/12/17. She has been working on intensive lifestyle modifications including diet, exercise, and weight loss to help control her blood glucose levels.  Hypertension Jasmine [name] is a 52 y.o. female with hypertension. Jasmine [name]'s blood pressure is controlled today and she denies chest pain, chest pressure, or palpitations. She is working weight loss to help control her blood pressure with the goal of decreasing her risk of heart attack and stroke. Jasmine [name]'s blood pressure is currently controlled.  At risk for cardiovascular disease Jasmine [name] is at a higher than average risk for cardiovascular disease due to obesity and hypertension. She currently denies any chest pain.  Depression Screen Jasmine [name]'s Food and Mood (modified PHQ-9) score was  Depression screen Jasmine [name] 2/9 10/07/2017  Decreased Interest  2  Down, Depressed, Hopeless 2  PHQ - 2 Score 4  Altered sleeping 0  Tired, decreased energy 1  Change in appetite 3  Feeling bad or failure about yourself  2  Trouble concentrating 3  Moving slowly or fidgety/restless 1  Suicidal thoughts 0  PHQ-9 Score 14  Difficult doing work/chores  Somewhat difficult  Some encounter information is confidential and restricted. Go to Review Flowsheets activity to see all data.    ALLERGIES: Allergies  Allergen Reactions  . Eggs Or Egg-Derived Products Hives and Other (See Comments)    FLU VACCINE: caused hives    . Influenza Vaccines Hives    Vaccine from Egg derived product causes Hives  . Effexor [Venlafaxine]     "felt weird"  . Metformin And Related Swelling    ANGIOEDEMA    MEDICATIONS: Current Outpatient Medications on File Prior to Visit  Medication Sig Dispense Refill  . buPROPion (WELLBUTRIN XL) 300 MG 24 hr tablet Take 1 tablet (300 mg total) by mouth daily. 30 tablet 1  . dapagliflozin propanediol (FARXIGA) 10 MG TABS tablet Take 10 mg by mouth daily. 90 tablet 1  . Dulaglutide (TRULICITY) 1.5 MG/0.5ML SOPN Inject 1.5 mg into the skin once a week. 12 pen 1  . losartan-hydrochlorothiazide (HYZAAR) 50-12.5 MG tablet Take 1 tablet by mouth daily. Must make appointment before any further refills for Blood Pressure 30 tablet 0  . sertraline (ZOLOFT) 100 MG tablet Take one and one/half tablet daily. 135 tablet 0  . simvastatin (ZOCOR) 10 MG tablet TAKE 1 TABLET (10 MG TOTAL) DAILY BY MOUTH. NEED LAB WORK FOR FUTURE REFILLS. 15 tablet 0  . TRESIBA FLEXTOUCH 100 UNIT/ML SOPN FlexTouch Pen INJECT 0.2ML INTO THE SKIN AT 10PM. INCREASE BY 2UNITS EVERY 3 DAYS TIL FASTING IS LESS THAN 120 30 pen 2  . TROKENDI XR 50 MG CP24 TAKE 1 CAPSULE BY MOUTH EVERY DAY 90 capsule 0  . [DISCONTINUED] DULoxetine (CYMBALTA) 20 MG capsule Take 1 capsule (20 mg total) by mouth daily. 90 capsule 1   No current facility-administered medications on file prior to visit.     PAST MEDICAL HISTORY: Past Medical History:  Diagnosis Date  . Anxiety   . Arthritis    "knees" (03/28/2014)  . Chronic renal insufficiency    stage II (mild)  . Depression   . Headache    with high blood sugar  . Hypercholesteremia   . Hypertension   . Numbness and  tingling of right arm and leg   . Seasonal allergies   . Type II diabetes mellitus (HCC)   . Umbilical hernia without mention of obstruction or gangrene   . Wears glasses     PAST SURGICAL HISTORY: Past Surgical History:  Procedure Laterality Date  . INSERTION OF MESH N/A 03/28/2014   Procedure: INSERTION OF MESH;  Surgeon: Axel Filler, MD;  [facility];  Service: General;  Laterality: N/A;  . INSERTION OF MESH N/A 05/08/2015   Procedure: INSERTION OF MESH;  Surgeon: Axel Filler, MD;  Location: [facility] OR;  Service: General;  Laterality: N/A;  . LAPAROSCOPIC LYSIS OF ADHESIONS N/A 05/08/2015   Procedure: LAPAROSCOPIC LYSIS OF ADHESIONS;  Surgeon: Axel Filler, MD;  Location: [facility] OR;  Service: General;  Laterality: N/A;  . MULTIPLE TOOTH EXTRACTIONS  ~ 2003  . TONSILLECTOMY    . UMBILICAL HERNIA REPAIR  1973  . VENTRAL HERNIA REPAIR N/A 03/28/2014   Procedure: LAPAROSCOPIC VENTRAL HERNIA;  Surgeon: Axel Filler, MD;  Location:  [facility] OR;  Service: General;  Laterality: N/A;  . VENTRAL HERNIA REPAIR N/A 05/08/2015   Procedure: LAPAROSCOPIC VENTRAL HERNIA REPAIR WITH MESH;  Surgeon: Axel Filler, MD;  Location: [facility] OR;  Service: General;  Laterality: N/A;    SOCIAL HISTORY: Social History   Tobacco Use  . Smoking status: Former Smoker    Packs/day: 0.25    Years: 26.00    Pack years: 6.50    Types: Cigarettes  . Smokeless tobacco: Never Used  Substance Use Topics  . Alcohol use: Yes    Comment: "have a drink a few times/year"  . Drug use: No    FAMILY HISTORY: Family History  Problem Relation Age of Onset  . Diabetes Mother   . High Cholesterol Mother   . Obesity Mother   . Diabetes Father   . Cancer Father        Prostate cancer  . Cancer Sister        Cervical cancer  . Diabetes Brother   . Bipolar disorder Brother   . Alcohol abuse Brother   . Drug abuse Brother   . Diabetes Sister   . Diabetes Brother     ROS: Review of Systems  Constitutional:  Positive for malaise/fatigue. Negative for weight loss.       + Trouble sleeping + Heat/cold intolerance  HENT: Positive for sinus pain.        + Hay fever + Dentures  Eyes: Positive for blurred vision and double vision.       + Wear glasses or contacts  Respiratory: Positive for shortness of breath (with exertion) and wheezing.   Cardiovascular: Negative for chest pain, palpitations and orthopnea.       Negative chest pressure + Calf/leg pain with walking  Gastrointestinal: Positive for heartburn.  Musculoskeletal: Positive for back pain.       + Neck stiffness + Muscle or joint pain + Muscle stiffness  Neurological: Positive for dizziness and headaches.  Endo/Heme/Allergies: Bruises/bleeds easily.  Psychiatric/Behavioral: Positive for depression. Negative for suicidal ideas. The patient is nervous/anxious.        + Stress    PHYSICAL EXAM: Blood pressure 137/84, pulse 78, temperature 97.9 F (36.6 C), temperature source Oral, height 5\' 2"  (1.575 m), weight 263 lb (119.3 kg), last menstrual period 01/24/2015, SpO2 96 %. Body mass index is 48.1 kg/m. Physical Exam  Constitutional: She is oriented to person, place, and time. She appears well-developed and well-nourished.  HENT:  Head: Normocephalic and atraumatic.  Nose: Nose normal.  Eyes: EOM are normal. No scleral icterus.  Neck: Normal range of motion. Neck supple. No thyromegaly present.  Cardiovascular: Normal rate and regular rhythm.  Pulmonary/Chest: Effort normal and breath sounds normal. No respiratory distress.  Abdominal: Soft. There is no tenderness.  + Obesity  Musculoskeletal:  Range of Motion normal in all 4 extremities Trace edema noted in bilateral lower extremities  Neurological: She is alert and oriented to person, place, and time. Coordination normal.  Skin: Skin is warm and dry.  + acanthosis nigricans  Psychiatric: She has a normal mood and affect. Her behavior is normal.  Vitals  reviewed.   RECENT LABS AND TESTS: BMET    Component Value Date/Time   NA 141 10/07/2017 1146   K 5.0 10/07/2017 1146   CL 105 10/07/2017 1146   CO2 23 10/07/2017 1146   GLUCOSE 124 (H) 10/07/2017 1146   GLUCOSE 96 09/09/2017 1430   BUN 12 10/07/2017 1146   CREATININE 1.28 (H)  10/07/2017 1146   CREATININE 1.50 (H) 09/09/2017 1430   CALCIUM 10.0 10/07/2017 1146   GFRNONAA 49 (L) 10/07/2017 1146   GFRNONAA 40 (L) 09/09/2017 1430   GFRAA 56 (L) 10/07/2017 1146   GFRAA 47 (L) 09/09/2017 1430   Lab Results  Component Value Date   HGBA1C 7.7 (H) 10/07/2017   Lab Results  Component Value Date   INSULIN 24.5 10/07/2017   CBC    Component Value Date/Time   WBC 5.0 10/07/2017 1146   WBC 4.7 11/26/2016 1134   RBC 5.46 (H) 10/07/2017 1146   RBC 5.60 (H) 11/26/2016 1134   HGB 12.8 10/07/2017 1146   HCT 39.5 10/07/2017 1146   PLT 288 11/26/2016 1134   MCV 72 (L) 10/07/2017 1146   MCH 23.4 (L) 10/07/2017 1146   MCH 23.2 (L) 11/26/2016 1134   MCHC 32.4 10/07/2017 1146   MCHC 31.5 (L) 11/26/2016 1134   RDW 16.5 (H) 10/07/2017 1146   LYMPHSABS 2.8 10/07/2017 1146   MONOABS 300 08/19/2016 1401   EOSABS 0.2 10/07/2017 1146   BASOSABS 0.1 10/07/2017 1146   Iron/TIBC/Ferritin/ %Sat    Component Value Date/Time   FERRITIN 35 11/26/2016 1134   Lipid Panel     Component Value Date/Time   CHOL 180 10/07/2017 1146   TRIG 183 (H) 10/07/2017 1146   HDL 45 10/07/2017 1146   CHOLHDL 4 09/09/2017 1430   VLDL 41.4 (H) 09/09/2017 1430   LDLCALC 98 10/07/2017 1146   LDLDIRECT 103.0 09/09/2017 1430   Hepatic Function Panel     Component Value Date/Time   PROT 7.2 10/07/2017 1146   ALBUMIN 4.5 10/07/2017 1146   AST 11 10/07/2017 1146   ALT 13 10/07/2017 1146   ALKPHOS 73 10/07/2017 1146   BILITOT <0.2 10/07/2017 1146      Component Value Date/Time   TSH 1.230 10/07/2017 1146   TSH 1.36 11/26/2016 1134   TSH 0.860 02/08/2015 0945    ECG  shows NSR with a rate of 84  BPM INDIRECT CALORIMETER done today shows a VO2 of 219 and a REE of 1525.  Her calculated basal metabolic rate is 6045 thus her basal metabolic rate is worse than expected.    ASSESSMENT AND PLAN: Other fatigue - Plan: Jasmine 12-Lead, Vitamin B12, Folate, T3, T4, free, TSH  Shortness of breath on exertion  Type 2 diabetes mellitus without complication, without long-term current use of insulin (HCC) - Plan: CBC With Differential, Comprehensive metabolic panel, Hemoglobin A1c, Insulin, random, CANCELED: Microalbumin / creatinine urine ratio  Essential hypertension - Plan: Lipid Panel With LDL/HDL Ratio  Depression screening  At risk for heart disease  Class 3 severe obesity with serious comorbidity and body mass index (BMI) of 45.0 to 49.9 in adult, unspecified obesity type (HCC)  PLAN:  Fatigue Carmon was informed that her fatigue may be related to obesity, depression or many other causes. Labs will be ordered, and in the meanwhile Yalissa has agreed to work on diet, exercise and weight loss to help with fatigue. Proper sleep hygiene was discussed including the need for 7-8 hours of quality sleep each night. A sleep study was not ordered based on symptoms and Epworth score.  Dyspnea on exertion Aayushi's shortness of breath appears to be obesity related and exercise induced. She has agreed to work on weight loss and gradually increase exercise to treat her exercise induced shortness of breath. If Reannon follows our instructions and loses weight without improvement of her shortness of breath, we  will plan to refer to pulmonology. We will monitor this condition regularly. Jasmine [name] agrees to this plan.  Diabetes II Jasmine [name] has been given extensive diabetes education by myself today including ideal fasting and post-prandial blood glucose readings, individual ideal Hgb A1c goals and discussed plan for hypoglycemia prevention. We discussed the importance of good blood sugar control to decrease  the likelihood of diabetic complications such as nephropathy, neuropathy, limb loss, blindness, coronary artery disease, and death. We discussed the importance of intensive lifestyle modification including diet, exercise and weight loss as the first line treatment for diabetes. Jasmine [name] agrees to continue her diabetes medications as prescribed. We will check labs and Seraiah agrees to follow up with our clinic in 2 weeks.  Hypertension We discussed sodium restriction, working on healthy weight loss, and a regular exercise program as the means to achieve improved blood pressure control. Janari agreed with this plan and agreed to follow up as directed. We will continue to monitor her blood pressure as well as her progress with the above lifestyle modifications. She will continue her medications as prescribed and will watch for signs of hypotension as she continues her lifestyle modifications. Jasmine [name] agrees to follow up with our clinic in 2 weeks.  Cardiovascular risk counselling Jasmine [name] was given extended (15 minutes) coronary artery disease prevention counseling today. She is 52 y.o. female and has risk factors for heart disease including obesity and hypertension. We discussed intensive lifestyle modifications today with an emphasis on specific weight loss instructions and strategies. Pt was also informed of the importance of increasing exercise and decreasing saturated fats to help prevent heart disease.  Depression Screen Jasmine [name] had a moderately positive depression screening. Depression is commonly associated with obesity and often results in emotional eating behaviors. We will monitor this closely and work on CBT to help improve the non-hunger eating patterns. Referral to Psychology may be required if no improvement is seen as she continues in our clinic.  Obesity Jasmine [name] is currently in the action stage of change and her goal is to continue with weight loss efforts. I recommend Jasmine [name] begin the  structured treatment plan as follows:  She has agreed to follow the Category 2 plan + 100 calories Jasmine [name] has been instructed to eventually work up to a goal of 150 minutes of combined cardio and strengthening exercise per week for weight loss and overall health benefits. We discussed the following Behavioral Modification Strategies today: increasing lean protein intake, decrease eating out, work on meal planning and easy cooking plans, decrease liquid calories, and planning for success   She was informed of the importance of frequent follow up visits to maximize her success with intensive lifestyle modifications for her multiple health conditions. She was informed we would discuss her lab results at her next visit unless there is a critical issue that needs to be addressed sooner. Marca agreed to keep her next visit at the agreed upon time to discuss these results.    OBESITY BEHAVIORAL INTERVENTION VISIT  Today's visit was # 1 out of 22.  Starting weight: 263 lbs Starting date: 10/07/17 Today's weight : 263 lbs  Today's date: 10/07/2017 Total lbs lost to date: 0 (Patients must lose 7 lbs in the first 6 months to continue with counseling)   ASK: We discussed the diagnosis of obesity with Stormy CardYolanda E Futch today and Jasmine [name] agreed to give us permission to discuss obesity behavioral modification therapy today.  ASSESS: Jasmine [name] has the diagnosis of obesity and her BMI today is 48.09 [place]  is in the action stage of change   ADVISE: Arneta was educated on the multiple health risks of obesity as well as the benefit of weight loss to improve her health. She was advised of the need for long term treatment and the importance of lifestyle modifications.  AGREE: Multiple dietary modification options and treatment options were discussed and  Lenette agreed to the above obesity treatment plan.   I, Burt Knack, am acting as transcriptionist for Debbra Riding, MD  I have reviewed  the above documentation for accuracy and completeness, and I agree with the above. - Debbra Riding, MD

## 2017-10-11 ENCOUNTER — Other Ambulatory Visit (HOSPITAL_COMMUNITY): Payer: PRIVATE HEALTH INSURANCE | Admitting: Psychiatry

## 2017-10-11 ENCOUNTER — Other Ambulatory Visit: Payer: Self-pay | Admitting: Physician Assistant

## 2017-10-11 DIAGNOSIS — F329 Major depressive disorder, single episode, unspecified: Secondary | ICD-10-CM | POA: Diagnosis not present

## 2017-10-11 DIAGNOSIS — F322 Major depressive disorder, single episode, severe without psychotic features: Secondary | ICD-10-CM

## 2017-10-11 DIAGNOSIS — I10 Essential (primary) hypertension: Secondary | ICD-10-CM

## 2017-10-12 ENCOUNTER — Encounter (HOSPITAL_COMMUNITY): Payer: Self-pay | Admitting: Psychiatry

## 2017-10-12 ENCOUNTER — Ambulatory Visit (INDEPENDENT_AMBULATORY_CARE_PROVIDER_SITE_OTHER): Payer: 59 | Admitting: Psychiatry

## 2017-10-12 VITALS — BP 120/74 | HR 94 | Ht 62.0 in | Wt 264.0 lb

## 2017-10-12 DIAGNOSIS — Z811 Family history of alcohol abuse and dependence: Secondary | ICD-10-CM

## 2017-10-12 DIAGNOSIS — E119 Type 2 diabetes mellitus without complications: Secondary | ICD-10-CM | POA: Diagnosis not present

## 2017-10-12 DIAGNOSIS — F322 Major depressive disorder, single episode, severe without psychotic features: Secondary | ICD-10-CM

## 2017-10-12 DIAGNOSIS — F411 Generalized anxiety disorder: Secondary | ICD-10-CM

## 2017-10-12 DIAGNOSIS — Z813 Family history of other psychoactive substance abuse and dependence: Secondary | ICD-10-CM

## 2017-10-12 DIAGNOSIS — Z87891 Personal history of nicotine dependence: Secondary | ICD-10-CM | POA: Diagnosis not present

## 2017-10-12 DIAGNOSIS — Z818 Family history of other mental and behavioral disorders: Secondary | ICD-10-CM

## 2017-10-12 MED ORDER — GABAPENTIN 100 MG PO CAPS: 100.0000 mg | ORAL_CAPSULE | Freq: Two times a day (BID) | ORAL | 0 refills | Status: DC

## 2017-10-12 NOTE — Progress Notes (Signed)
[facility] Outpatient Follow up visit   Patient Identification: Jasmine [name] MRN:  [id number] Date of Evaluation:  10/12/2017 Referral Source: Her therapist Dominic Pea  Chief Complaint:   Chief Complaint    Follow-up    I am not doing good.  Visit Diagnosis:    ICD-10-CM   1. 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 Generalized anxiety disorder F41.1   3. Anxiety state F41.1     History of Present Illness: [name] is 52 year old African-American married, She is part of IOP now and returns for follow up  Brief history "She is an employee at [employer] for 19 years.  Her biggest concern is her job situation.  She has been not back to work since October.  She endorsed that she has been billing by her supervisor.  Her eyes are very sensitive due to long-standing diabetes.  She needs large glasses and bigger computer screen to work and her supervisor sometimes past comments that she felt offended.  Her other stressors are husband not bring money.  He works out of town and patient believes he may be using drugs."   She is in IOP. Helping her depression. 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  Previous Psychotropic Medications: Yes   Substance Abuse History in the last 12 months:  No.  Consequences of Substance Abuse: Negative  Past Medical History:  Past Medical History:  Diagnosis Date  . Anxiety   . Arthritis    "knees" (03/28/2014)  . Chronic renal insufficiency    stage II (mild)  . Depression   . Headache    with high blood  sugar  . Hypercholesteremia   . Hypertension   . Numbness and tingling of right arm and leg   . Seasonal allergies   . Type II diabetes mellitus (HCC)   . Umbilical hernia without mention of obstruction or gangrene   . Wears glasses     Past Surgical History:  Procedure Laterality Date  . INSERTION OF MESH N/A 03/28/2014   Procedure: INSERTION OF MESH;  Surgeon: Axel Filler, MD;  Location: [facility];  Service: General;  Laterality: N/A;  . INSERTION OF MESH N/A 05/08/2015   Procedure: INSERTION OF MESH;  Surgeon: Axel Filler, MD;  Location: [facility] OR;  Service: General;  Laterality: N/A;  . LAPAROSCOPIC LYSIS OF ADHESIONS N/A 05/08/2015   Procedure: LAPAROSCOPIC LYSIS OF ADHESIONS;  Surgeon: Axel Filler, MD;  Location: [facility] OR;  Service: General;  Laterality: N/A;  . MULTIPLE TOOTH EXTRACTIONS  ~ 2003  . TONSILLECTOMY    . UMBILICAL HERNIA REPAIR  1973  . VENTRAL HERNIA REPAIR N/A 03/28/2014   Procedure: LAPAROSCOPIC VENTRAL HERNIA;  Surgeon: Axel Filler, MD;  Location: [facility] OR;  Service: General;  Laterality: N/A;  . VENTRAL HERNIA REPAIR N/A 05/08/2015   Procedure: LAPAROSCOPIC VENTRAL HERNIA REPAIR WITH MESH;  Surgeon: Axel Filler, MD;  Location: [facility] OR;  Service: General;  Laterality: N/A;    Family Psychiatric History: Viewed  Family History:  Family History  Problem Relation Age of Onset  . Diabetes Mother   .  High Cholesterol Mother   . Obesity Mother   . Diabetes Father   . Cancer Father        Prostate cancer  . Cancer Sister        Cervical cancer  . Diabetes Brother   . Bipolar disorder Brother   . Alcohol abuse Brother   . Drug abuse Brother   . Diabetes Sister   . Diabetes Brother     Social History:   Social History   Socioeconomic History  . Marital status: Married    Spouse name: Florencia Reasons  . Number of children: 1  . Years of education: None  . Highest education level: None  Social Needs  . Financial resource strain: None  . Food  insecurity - worry: None  . Food insecurity - inability: None  . Transportation needs - medical: None  . Transportation needs - non-medical: None  Occupational History  . None  Tobacco Use  . Smoking status: Former Smoker    Packs/day: 0.25    Years: 26.00    Pack years: 6.50    Types: Cigarettes  . Smokeless tobacco: Never Used  Substance and Sexual Activity  . Alcohol use: Yes    Comment: "have a drink a few times/year"  . Drug use: No  . Sexual activity: Not Currently  Other Topics Concern  . None  Social History Narrative  . None     Allergies:   Allergies  Allergen Reactions  . Eggs Or Egg-Derived Products Hives and Other (See Comments)    FLU VACCINE: caused hives    . Influenza Vaccines Hives    Vaccine from Egg derived product causes Hives  . Effexor [Venlafaxine]     "felt weird"  . Metformin And Related Swelling    ANGIOEDEMA    Metabolic Disorder Labs: Lab Results  Component Value Date   HGBA1C 7.7 (H) 10/07/2017   MPG 384 (H) 01/09/2015   MPG 160 (H) 03/28/2014   No results found for: PROLACTIN Lab Results  Component Value Date   CHOL 180 10/07/2017   TRIG 183 (H) 10/07/2017   HDL 45 10/07/2017   CHOLHDL 4 09/09/2017   VLDL 41.4 (H) 09/09/2017   LDLCALC 98 10/07/2017   LDLCALC 116 (H) 06/16/2016     Current Medications: Current Outpatient Medications  Medication Sig Dispense Refill  . buPROPion (WELLBUTRIN XL) 300 MG 24 hr tablet TAKE 1 TABLET BY MOUTH EVERY DAY 30 tablet 1  . dapagliflozin propanediol (FARXIGA) 10 MG TABS tablet Take 10 mg by mouth daily. 90 tablet 1  . Dulaglutide (TRULICITY) 1.5 MG/0.5ML SOPN Inject 1.5 mg into the skin once a week. 12 pen 1  . losartan-hydrochlorothiazide (HYZAAR) 50-12.5 MG tablet Take 1 tablet by mouth daily. Must make appointment before any further refills for Blood Pressure 15 tablet 0  . sertraline (ZOLOFT) 100 MG tablet Take one and one/half tablet daily. 135 tablet 0  . simvastatin (ZOCOR) 10 MG  tablet TAKE 1 TABLET (10 MG TOTAL) DAILY BY MOUTH. NEED LAB WORK FOR FUTURE REFILLS. 15 tablet 0  . TRESIBA FLEXTOUCH 100 UNIT/ML SOPN FlexTouch Pen INJECT 0.2ML INTO THE SKIN AT 10PM. INCREASE BY 2UNITS EVERY 3 DAYS TIL FASTING IS LESS THAN 120 30 pen 2  . TROKENDI XR 50 MG CP24 TAKE 1 CAPSULE BY MOUTH EVERY DAY 90 capsule 0  . gabapentin (NEURONTIN) 100 MG capsule Take 1 capsule (100 mg total) by mouth 2 (two) times daily. Start with one at evening and  in 2 days start twice a day 60 capsule 0   No current facility-administered medications for this visit.       Psychiatric Specialty Exam: Review of Systems  Cardiovascular: Negative for chest pain.  Skin: Negative for rash.    Blood pressure 120/74, pulse 94, height 5\' 2"  (1.575 m), weight 264 lb (119.7 kg).Body mass index is 48.29 kg/m.  General Appearance: Casual and Obese  Eye Contact:  Fair  Speech:  Slow  Volume:  Decreased  Mood:  subdued  Affect: constricted  Thought Process:  Goal Directed  Orientation:  Full (Time, Place, and Person)  Thought Content:  Rumination  Suicidal Thoughts:  No  Homicidal Thoughts:  No  Memory:  Immediate;   Fair Recent;   Fair Remote;   Fair  Judgement:  Good  Insight:  Good  Psychomotor Activity:  Decreased  Concentration:  Concentration: Fair and Attention Span: Fair  Recall:  Good  Fund of Knowledge:Good  Language: Good  Akathisia:  No  Handed:  Right  AIMS (if indicated):  0  Assets:  Communication Skills Desire for Improvement Housing  ADL's:  Intact  Cognition: WNL  Sleep:  fair    Treatment Plan Summary: Major depressive disorder, recurrent moderate.  Rule out bipolar disorder depressed type.  Generalized anxiety disorder. Will start gabapentin as mood stabilizer , small dose 100mg  bid and continue wellbutrin  Panic/ GAD" ; related to apprehension of how to deal with work  continue zoloft  To remain out of work till march 18 when she sees Doctor, general practicearah. As of now continue  IOP   has refills.   fu4 w Thresa RossNadeem Joley Utecht, MD 3/5/201911:45 AM

## 2017-10-12 NOTE — Progress Notes (Signed)
    Daily Group Progress Note  Program: IOP  Group Time: 9:00-12:00 Participation Level: Active Behavioral Response: Sharing, Appropriate Type of Therapy:  Group Summary of Progress: The first segment of group therapy dealt with the pharmacist coming in and speaking to all of the patients. Topics on medication management and consumption understandings were addressed. Group session was focused on themes involving the creation of healthy boundaries. Group also spoke about building up more confidence in assertively saying "no." Lastly, members also discussed topics pertaining to verbalizing personal needs.Patient mentioned having a pleasant weekend. Expressed how her weekend was off to a good start following the topics of grief that were discussed last Friday. She was able to spend some quality time with her daughter and also dedicated some time towards her own self-care. Throughout the session, patient related to other members in regards to setting personal boundaries and saying "no" more often. She also positively provided feedback and encouragement towards other group members.  [name], Jennifer B, LPC

## 2017-10-12 NOTE — Progress Notes (Signed)
    Daily Group Progress Note  Program: IOP  Group Time: 9:00-12:00  Participation Level: Active  Behavioral Response: Appropriate  Type of Therapy:  Group Therapy  Summary of Progress: Pt. Presented with bright affect, talkative, engaged in the therapeutic process. Pt. Shared that she was very frustrated with her bank and received feedback from the group about solving the problem with her bank. Pt. Shared her experience from the day before about the EEOC and feels very positive about advocating for herself. Pt. Discussed that she is hopeful that noone else will have to go through what she has experienced from her employer and that will make it all worthwhile for her. Pt. Participated in discussion about the importance of identifying what we can control in situations that feel outside of our control, assertiveness and personal advocacy.    [name], Lindsay Straka B, LPC

## 2017-10-12 NOTE — Patient Instructions (Signed)
Need to remain off work till 10/25/2017 till She see Maralyn SagoSarah for counselling At present she is still in Intensive Outpatient therapy

## 2017-10-13 ENCOUNTER — Other Ambulatory Visit (HOSPITAL_COMMUNITY): Payer: PRIVATE HEALTH INSURANCE

## 2017-10-13 ENCOUNTER — Other Ambulatory Visit (HOSPITAL_COMMUNITY): Payer: PRIVATE HEALTH INSURANCE | Admitting: Psychiatry

## 2017-10-13 DIAGNOSIS — F329 Major depressive disorder, single episode, unspecified: Secondary | ICD-10-CM | POA: Diagnosis not present

## 2017-10-13 DIAGNOSIS — F322 Major depressive disorder, single episode, severe without psychotic features: Secondary | ICD-10-CM

## 2017-10-13 NOTE — Progress Notes (Signed)
  [facility] Discharge Summary  Stormy CardYolanda E Derrick [id number]  Admission date: 09/23/2017 Discharge date: 10/13/2017  Reason for admission: Depression and Anxiety   Per HPI assessment note-Kyrah is 52 year old African-American married, employed female who is referred from her therapist for intensive outpatient program.  Patient done intensive outpatient program in October 2018.  She is an employee at [employer] for 19 years.  Her biggest concern is her job situation.  She has been not back to work since October.  She endorsed that she has been billing by her supervisor.  Her eyes are very sensitive due to long-standing diabetes.  She needs large glasses and bigger computer screen to work and her supervisor sometimes past comments that she felt offended.  Her other stressors are husband not bring money.  He works out of town and patient believes he may be using drugs.  Patient is seeing Dr. [place]ice [place]ter in [place] and prescribed Wellbutrin and Zoloft.   Chemical Use History: Was denied  Family of Origin Issues:  Reports a good support system with her daughter and husband. States her sister recently came to visit from [place], however has return home.     Progress in Program Toward Treatment Goals: [name] was active and participated with daily group session. Report learning new coping skill such as deep breathing the "correct way."  Reports she continues to struggle with anxiety. Patient was recently started on Gabapentin 100 mg PO BID. States her depression has improved.    Progress (rationale): Patient to continue with current treatment plan and medications management. Continue working on coping learned from intensive outpatient program. [name] was provided with additional resources     Discharge Disposition: - Keep all  follow with Psychiatrist  and Therapist as Scheduled -Patient to consider [facility] and After care group Reports she  has plans to follow-up with Suicidal Prevention group with [facility]   Take all medications as prescribed. Continue to take medication as directed. Keep in mind that anti-depressant medications can take about 4-6 weeks before it is effective. You may not notice complete remission of your depressive symptoms until about 2  months from target dose.  Do not consume alcohol or use illegal drugs while on prescription medications. Report any adverse effects from your medications to your primary care provider promptly.  In the event of recurrent symptoms or worsening symptoms, call 911, a crisis hotline, or go to the nearest emergency department for evaluation.    Hillery Jacksanika Lewis FNP- [facility]  [facility] 10/13/2017

## 2017-10-13 NOTE — Progress Notes (Signed)
    Daily Group Progress Note  Program: IOP  Jasmine [name]        Group Time: 9:00-12:00 Participation Level: Active Behavioral Response: Sharing, Appropriate Type of Therapy:  Group Summary of Progress: Pharmacist arrived at the beginning of the session to provide patients with information regarding medication management, modifications and addressed questions or concerns. The group session focused on themes involving setting boundaries, letting go of what's not in the patients' control and bringing awareness to how personal expectations can affect how others are being perceived. Patient updated group about her equal opportunity commission meeting from last week. States that she was informed about her case having the potential for settlement. Also expressed looking forward to any positive impact the settlement may have on her returning to work. Group members responded positively to the update. Member also provided attentive feedback towards other group members.  [name], Jennifer B, LPC

## 2017-10-13 NOTE — Progress Notes (Signed)
Jasmine CardYolanda E [name] is a 52 y.o. ,married, employed, [place], who was referred per therapist Jasmine [name]([name], [facility]) treatment for worsening depressive and anxiety symptoms with passive SI.  Denied a plan or intent.  Pt is well known to this writer due to recent admit in IOP (October 2018).  Therapist [facility] had [facility]; but pt didn't meet criteria for that level of care.  Denied HI or A/V hallucinations. Hx of depression, sixteen yrs ago whenever her father passed. Deniedany past psychiatric hospitalizations or suicide attempts or gestures. Stated she and Jasmine SalterSara [name], [facility] were working on a plan for her to return back to work. 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 According to pt, things has become worse since getting a Writernew manager in September 2018. "In September I was written up twice in one day. I've been called into the office four times since September." Pt states that her manager is a bully.Pt states she feels that she is not ready to return to work. Hasn't worked since May 15, 2017.  Pt states she has been looking at other job options. 2) Medical: Pt has Diabetes (Dx three yrs ago). Pt was out of work in March-April 2018 for six weeks d/t Diabetes.3) Husband of 19 yrs: Pthad beensuspectingthat her husband hadrelapsed on drugs d/t not having extra money. States that he admitted that he was using and he mentioned that he wouldn't use again; but according to pt he continues to use.  Pt encouraged him to seek therapy, but he hasn't. 4) Unresolved grief/loss issues: Father died ~ 17 yrs ago. Pt states she was very close to hr father. Nephew died in July 2018.  States she's been having dreams about him.Family Hx: (Brother) Bipolar D/O, ETOH and drugs. Pt completed MH-IOP today; although she wanted to continue attending.  Pt doesn't meet criteria to continue in this level of care.  Pt continues to be anxious about  returning to work.  Read Dr. Lavella LemonsAkhtar's note and he is requesting that pt not return to work until she meets with her therapist on 10-25-17.  Pt denies SI/HI or A/V hallucinations.  Reports group was more helpful this time than last time.  A:  D/C today.  Extended patient's leave from work until 10-25-17; RTW on 10-26-17.  Pt will follow up with [facility] and [facility] [place].  Pt states she has already signed up for [facility].  F/U with Dr. Gilmore LarocheAkhtar in one month and Jasmine SalterSara Solomon, LCSW on 10-25-17.  RTW on 10-26-17.  R:  Pt receptive.        Jasmine SporeLARK, Jasmine [name], M.Ed, CNA

## 2017-10-13 NOTE — Progress Notes (Signed)
    Daily Group Progress Note  Program: IOP  Group Time: 9:00-12:00  Participation Level: Active  Behavioral Response: Appropriate  Type of Therapy:  Group Therapy  Summary of Progress: Pt.'s last day in group. Pt. Met with case manager and nurse practitioner for discharge planning. Pt. Reviewed her progress with setting boundaries and moving forward with health goals. Pt. Received positive feedback from the group regarding her contribution to the group process. Pt. Shared that she was prepared for her return to work next week.     Nancie Neas, LPC

## 2017-10-13 NOTE — Patient Instructions (Signed)
D:  Patient completed MH-IOP today.  A:  Discharge from IOP today.  Follow up with Dr. Gilmore LarocheAkhtar in one month and Dominic PeaSarah Solomon, LCSW on 10-25-17.  Encouraged support groups and [facility].  Idalia NeedleBeth MacKenzie, LCAS on Tuesdays (5:30-6:30 pm)  [phone] or Dorann LodgeWes Swan, LPC on Wednesdays (5:30-6:30 pm)  [phone].  R:  Pt receptive.

## 2017-10-14 ENCOUNTER — Other Ambulatory Visit (HOSPITAL_COMMUNITY): Payer: PRIVATE HEALTH INSURANCE | Admitting: Psychiatry

## 2017-10-14 ENCOUNTER — Other Ambulatory Visit (HOSPITAL_COMMUNITY): Payer: PRIVATE HEALTH INSURANCE

## 2017-10-15 ENCOUNTER — Other Ambulatory Visit (HOSPITAL_COMMUNITY): Payer: PRIVATE HEALTH INSURANCE

## 2017-10-16 ENCOUNTER — Other Ambulatory Visit (HOSPITAL_COMMUNITY): Payer: Self-pay | Admitting: Psychiatry

## 2017-10-16 DIAGNOSIS — F322 Major depressive disorder, single episode, severe without psychotic features: Secondary | ICD-10-CM

## 2017-10-18 ENCOUNTER — Other Ambulatory Visit (HOSPITAL_COMMUNITY): Payer: PRIVATE HEALTH INSURANCE

## 2017-10-19 ENCOUNTER — Other Ambulatory Visit (HOSPITAL_COMMUNITY): Payer: PRIVATE HEALTH INSURANCE

## 2017-10-19 ENCOUNTER — Other Ambulatory Visit (HOSPITAL_COMMUNITY): Payer: Self-pay | Admitting: Psychiatry

## 2017-10-19 MED ORDER — BUPROPION HCL ER (XL) 300 MG PO TB24: 300.0000 mg | ORAL_TABLET | Freq: Every day | ORAL | 0 refills | Status: DC

## 2017-10-19 NOTE — Telephone Encounter (Signed)
Pt states that per [facility] Gilmore Larocheakhtar needs to authorize her to get wellbutrin.   [facility] [address]  Please call pt back at [phone]

## 2017-10-19 NOTE — Telephone Encounter (Signed)
I spoke with pharmacy and they said that insurance will only pay for a 90 day supply. Resent Wellbutrin in with a 90 day supply per Dr. Gilmore LarocheAkhtar. Notified patient. Nothing further is needed at this time.

## 2017-10-20 ENCOUNTER — Other Ambulatory Visit (HOSPITAL_COMMUNITY): Payer: PRIVATE HEALTH INSURANCE

## 2017-10-21 ENCOUNTER — Other Ambulatory Visit (HOSPITAL_COMMUNITY): Payer: PRIVATE HEALTH INSURANCE

## 2017-10-21 ENCOUNTER — Encounter (INDEPENDENT_AMBULATORY_CARE_PROVIDER_SITE_OTHER): Payer: Self-pay | Admitting: Family Medicine

## 2017-10-21 ENCOUNTER — Ambulatory Visit (INDEPENDENT_AMBULATORY_CARE_PROVIDER_SITE_OTHER): Payer: 59 | Admitting: Family Medicine

## 2017-10-21 VITALS — BP 137/83 | HR 77 | Temp 98.0°F | Ht 62.0 in | Wt 262.0 lb

## 2017-10-21 DIAGNOSIS — E66813 Obesity, class 3: Secondary | ICD-10-CM

## 2017-10-21 DIAGNOSIS — Z6841 Body Mass Index (BMI) 40.0 and over, adult: Secondary | ICD-10-CM

## 2017-10-21 DIAGNOSIS — E1169 Type 2 diabetes mellitus with other specified complication: Secondary | ICD-10-CM | POA: Diagnosis not present

## 2017-10-21 DIAGNOSIS — E559 Vitamin D deficiency, unspecified: Secondary | ICD-10-CM | POA: Diagnosis not present

## 2017-10-21 DIAGNOSIS — D509 Iron deficiency anemia, unspecified: Secondary | ICD-10-CM

## 2017-10-21 DIAGNOSIS — Z794 Long term (current) use of insulin: Secondary | ICD-10-CM

## 2017-10-22 ENCOUNTER — Other Ambulatory Visit (HOSPITAL_COMMUNITY): Payer: PRIVATE HEALTH INSURANCE

## 2017-10-25 ENCOUNTER — Other Ambulatory Visit (HOSPITAL_COMMUNITY): Payer: PRIVATE HEALTH INSURANCE

## 2017-10-25 ENCOUNTER — Ambulatory Visit (INDEPENDENT_AMBULATORY_CARE_PROVIDER_SITE_OTHER): Payer: PRIVATE HEALTH INSURANCE | Admitting: Licensed Clinical Social Worker

## 2017-10-25 DIAGNOSIS — F331 Major depressive disorder, recurrent, moderate: Secondary | ICD-10-CM | POA: Diagnosis not present

## 2017-10-25 DIAGNOSIS — F411 Generalized anxiety disorder: Secondary | ICD-10-CM | POA: Diagnosis not present

## 2017-10-25 NOTE — Progress Notes (Signed)
Office: [phone]  /  Fax: [phone]   HPI:   Chief Complaint: OBESITY Jasmine [name] is here to discuss her progress with her obesity treatment plan. She is on the Category 2 plan +100 calories and is following her eating plan approximately 50 % of the time. She states she is exercising 0 minutes 0 times per week. Jasmine [name] had a few slip ups during the past few weeks, but otherwise she felt she did really well on the meal plan. She is still craving sweets. Her weight is 262 lb (118.8 kg) today and has had a weight loss of 1 pound over a period of 2 weeks since her last visit. She has lost 1 lb since starting treatment with [place].  Diabetes II Jasmine [name] has a diagnosis of diabetes type II. Jasmine [name] states her lowest blood sugar was 72 (fasting) and average blood sugar is in the 130's, 2 hour post prandial range approximately in the 80's. Unfortunately she didn't bring her blood sugar log. Jasmine [name] denies any hypoglycemic episodes.  She has been working on intensive lifestyle modifications including diet, exercise, and weight loss to help control her blood glucose levels.  Vitamin D deficiency Jasmine [name] has a diagnosis of vitamin D deficiency. Her last level was in April 2018 (unclear why level was cancelled from previous visit) She is not currently taking vit D and denies nausea, vomiting or muscle weakness.   Ref. Range 11/26/2016 11:34  Vitamin D, 25-Hydroxy Latest Ref Range: 30 - 100 ng/mL 10 (L)   Microcytic Anemia Jasmine [name] has a diagnosis of anemia. There is no prior work up. She notes fatigue and is not on iron supplementation.   ALLERGIES: Allergies  Allergen Reactions  . Eggs Or Egg-Derived Products Hives and Other (See Comments)    FLU VACCINE: caused hives    . Influenza Vaccines Hives    Vaccine from Egg derived product causes Hives  . Effexor [Venlafaxine]     "felt weird"  . Metformin And Related Swelling    ANGIOEDEMA    MEDICATIONS: Current Outpatient Medications on File  Prior to Visit  Medication Sig Dispense Refill  . buPROPion (WELLBUTRIN XL) 300 MG 24 hr tablet Take 1 tablet (300 mg total) by mouth Jasmine [name]. 90 tablet 0  . dapagliflozin propanediol (FARXIGA) 10 MG TABS tablet Take 10 mg by mouth Jasmine [name]. 90 tablet 1  . Dulaglutide (TRULICITY) 1.5 MG/0.5ML SOPN Inject 1.5 mg into the skin once a week. 12 pen 1  . gabapentin (NEURONTIN) 100 MG capsule Take 1 capsule (100 mg total) by mouth 2 (two) times Jasmine [name]. Start with one at evening and in 2 days start twice a day 60 capsule 0  . losartan-hydrochlorothiazide (HYZAAR) 50-12.5 MG tablet Take 1 tablet by mouth Jasmine [name]. Must make appointment before any further refills for Blood Pressure 15 tablet 0  . sertraline (ZOLOFT) 100 MG tablet Take one and one/half tablet Jasmine [name]. 135 tablet 0  . simvastatin (ZOCOR) 10 MG tablet TAKE 1 TABLET (10 MG TOTAL) Jasmine [name] BY MOUTH. NEED LAB WORK FOR FUTURE REFILLS. 15 tablet 0  . TRESIBA FLEXTOUCH 100 UNIT/ML SOPN FlexTouch Pen INJECT 0.2ML INTO THE SKIN AT 10PM. INCREASE BY 2UNITS EVERY 3 DAYS TIL FASTING IS LESS THAN 120 30 pen 2  . TROKENDI XR 50 MG CP24 TAKE 1 CAPSULE BY MOUTH EVERY DAY 90 capsule 0  . [DISCONTINUED] DULoxetine (CYMBALTA) 20 MG capsule Take 1 capsule (20 mg total) by mouth Jasmine [name]. 90 capsule 1   No current facility-administered medications on file prior  to visit.     PAST MEDICAL HISTORY: Past Medical History:  Diagnosis Date  . Anxiety   . Arthritis    "knees" (03/28/2014)  . Chronic renal insufficiency    stage II (mild)  . Depression   . Headache    with high blood sugar  . Hypercholesteremia   . Hypertension   . Numbness and tingling of right arm and leg   . Seasonal allergies   . Type II diabetes mellitus (HCC)   . Umbilical hernia without mention of obstruction or gangrene   . Wears glasses     PAST SURGICAL HISTORY: Past Surgical History:  Procedure Laterality Date  . INSERTION OF MESH N/A 03/28/2014   Procedure: INSERTION OF MESH;  Surgeon:  Axel Filler, MD;  Location: [facility] OR;  Service: General;  Laterality: N/A;  . INSERTION OF MESH N/A 05/08/2015   Procedure: INSERTION OF MESH;  Surgeon: Axel Filler, MD;  Location: [facility] OR;  Service: General;  Laterality: N/A;  . LAPAROSCOPIC LYSIS OF ADHESIONS N/A 05/08/2015   Procedure: LAPAROSCOPIC LYSIS OF ADHESIONS;  Surgeon: Axel Filler, MD;  Location: [facility] OR;  Service: General;  Laterality: N/A;  . MULTIPLE TOOTH EXTRACTIONS  ~ 2003  . TONSILLECTOMY    . UMBILICAL HERNIA REPAIR  1973  . VENTRAL HERNIA REPAIR N/A 03/28/2014   Procedure: LAPAROSCOPIC VENTRAL HERNIA;  Surgeon: Axel Filler, MD;  Location: [facility] OR;  Service: General;  Laterality: N/A;  . VENTRAL HERNIA REPAIR N/A 05/08/2015   Procedure: LAPAROSCOPIC VENTRAL HERNIA REPAIR WITH MESH;  Surgeon: Axel Filler, MD;  Location: [facility] OR;  Service: General;  Laterality: N/A;    SOCIAL HISTORY: Social History   Tobacco Use  . Smoking status: Former Smoker    Packs/day: 0.25    Years: 26.00    Pack years: 6.50    Types: Cigarettes  . Smokeless tobacco: Never Used  Substance Use Topics  . Alcohol use: Yes    Comment: "have a drink a few times/year"  . Drug use: No    FAMILY HISTORY: Family History  Problem Relation Age of Onset  . Diabetes Mother   . High Cholesterol Mother   . Obesity Mother   . Diabetes Father   . Cancer Father        Prostate cancer  . Cancer Sister        Cervical cancer  . Diabetes Brother   . Bipolar disorder Brother   . Alcohol abuse Brother   . Drug abuse Brother   . Diabetes Sister   . Diabetes Brother     ROS: Review of Systems  Constitutional: Positive for malaise/fatigue and weight loss.  Gastrointestinal: Negative for nausea and vomiting.  Musculoskeletal:       Negative muscle weakness    PHYSICAL EXAM: Blood pressure 137/83, pulse 77, temperature 98 F (36.7 C), temperature source Oral, height 5\' 2"  (1.575 m), weight 262 lb (118.8 kg), SpO2 99 %. Body mass index is  47.92 kg/m. Physical Exam  Constitutional: She is oriented to person, place, and time. She appears well-developed and well-nourished.  Cardiovascular: Normal rate.  Pulmonary/Chest: Effort normal.  Musculoskeletal: Normal range of motion.  Neurological: She is oriented to person, place, and time.  Skin: Skin is warm and dry.  Psychiatric: She has a normal mood and affect. Her behavior is normal.  Vitals reviewed.   RECENT LABS AND TESTS: BMET    Component Value Date/Time   NA 141 10/07/2017 1146   K 5.0 10/07/2017 1146  CL 105 10/07/2017 1146   CO2 23 10/07/2017 1146   GLUCOSE 124 (H) 10/07/2017 1146   GLUCOSE 96 09/09/2017 1430   BUN 12 10/07/2017 1146   CREATININE 1.28 (H) 10/07/2017 1146   CREATININE 1.50 (H) 09/09/2017 1430   CALCIUM 10.0 10/07/2017 1146   GFRNONAA 49 (L) 10/07/2017 1146   GFRNONAA 40 (L) 09/09/2017 1430   GFRAA 56 (L) 10/07/2017 1146   GFRAA 47 (L) 09/09/2017 1430   Lab Results  Component Value Date   HGBA1C 7.7 (H) 10/07/2017   HGBA1C 8.4 07/12/2017   HGBA1C 7.1 04/07/2017   HGBA1C 7.8 01/22/2017   HGBA1C 11.4 10/20/2016   Lab Results  Component Value Date   INSULIN 24.5 10/07/2017   CBC    Component Value Date/Time   WBC 5.0 10/07/2017 1146   WBC 4.7 11/26/2016 1134   RBC 5.46 (H) 10/07/2017 1146   RBC 5.60 (H) 11/26/2016 1134   HGB 12.8 10/07/2017 1146   HCT 39.5 10/07/2017 1146   PLT 288 11/26/2016 1134   MCV 72 (L) 10/07/2017 1146   MCH 23.4 (L) 10/07/2017 1146   MCH 23.2 (L) 11/26/2016 1134   MCHC 32.4 10/07/2017 1146   MCHC 31.5 (L) 11/26/2016 1134   RDW 16.5 (H) 10/07/2017 1146   LYMPHSABS 2.8 10/07/2017 1146   MONOABS 300 08/19/2016 1401   EOSABS 0.2 10/07/2017 1146   BASOSABS 0.1 10/07/2017 1146   Iron/TIBC/Ferritin/ %Sat    Component Value Date/Time   FERRITIN 35 11/26/2016 1134   Lipid Panel     Component Value Date/Time   CHOL 180 10/07/2017 1146   TRIG 183 (H) 10/07/2017 1146   HDL 45 10/07/2017 1146    CHOLHDL 4 09/09/2017 1430   VLDL 41.4 (H) 09/09/2017 1430   LDLCALC 98 10/07/2017 1146   LDLDIRECT 103.0 09/09/2017 1430   Hepatic Function Panel     Component Value Date/Time   PROT 7.2 10/07/2017 1146   ALBUMIN 4.5 10/07/2017 1146   AST 11 10/07/2017 1146   ALT 13 10/07/2017 1146   ALKPHOS 73 10/07/2017 1146   BILITOT <0.2 10/07/2017 1146      Component Value Date/Time   TSH 1.230 10/07/2017 1146   TSH 1.36 11/26/2016 1134   TSH 0.860 02/08/2015 0945    Ref. Range 11/26/2016 11:34  Vitamin D, 25-Hydroxy Latest Ref Range: 30 - 100 ng/mL 10 (L)   ASSESSMENT AND PLAN: Controlled type 2 diabetes mellitus with other specified complication, with long-term current use of insulin (HCC)  Vitamin D deficiency  Microcytic anemia  Class 3 severe obesity with serious comorbidity and body mass index (BMI) of 45.0 to 49.9 in adult, unspecified obesity type (HCC)  PLAN:  Diabetes II Jasmine [name] has been given extensive diabetes education by myself today including ideal fasting and post-prandial blood glucose readings, individual ideal Hgb A1c goals  and hypoglycemia prevention. We discussed the importance of good blood sugar control to decrease the likelihood of diabetic complications such as nephropathy, neuropathy, limb loss, blindness, coronary artery disease, and death. We discussed the importance of intensive lifestyle modification including diet, exercise and weight loss as the first line treatment for diabetes. Jasmine [name] agrees to check her blood sugar at least 2 times Jasmine [name] and bring her blood sugar log to her next visit. Jasmine [name] agrees to continue her diabetes medications and will follow up at the agreed upon time.  Vitamin D Deficiency Jasmine [name] was informed that low vitamin D levels contributes to fatigue and are associated with obesity, breast, and  colon cancer. She agrees to start to take OTC Vit D @5 ,000 IU every day. We will draw a vitamin D level at her next visit and she will follow  up for routine testing of vitamin D, at least 2-3 times per year. She was informed of the risk of over-replacement of vitamin D and agrees to not increase her dose unless she discusses this with us first. Jasmine [name] agrees to follow up with our clinic in 2 weeks.  Microcytic Anemia The diagnosis of microcytic anemia was discussed with Jasmine [name] and was explained in detail. She was given suggestions of iron rich foods and iron supplement was not prescribed. Jasmine [name] is to discuss further work up with her PCP and if that visit is after our next visit, we will do anemia panel at her next visit.  Obesity Jasmine [name] is currently in the action stage of change. As such, her goal is to continue with weight loss efforts She has agreed to follow the Category 2 plan +100 calories Jasmine [name] has been instructed to work up to a goal of 150 minutes of combined cardio and strengthening exercise per week for weight loss and overall health benefits. We discussed the following Behavioral Modification Strategies today: keeping healthy foods in the home, planning for success, increasing lean protein intake and work on meal planning and easy cooking plans  Jasmine [name] has agreed to follow up with our clinic in 2 weeks. She was informed of the importance of frequent follow up visits to maximize her success with intensive lifestyle modifications for her multiple health conditions.   OBESITY BEHAVIORAL INTERVENTION VISIT  Today's visit was # 2 out of 22.  Starting weight: 263 lbs Starting date: 10/07/17 Today's weight : 262 lbs Today's date: 10/21/2017 Total lbs lost to date: 1 (Patients must lose 7 lbs in the first 6 months to continue with counseling)   ASK: We discussed the diagnosis of obesity with Jasmine [name] today and Jasmine [name] agreed to give us permission to discuss obesity behavioral modification therapy today.  ASSESS: Jasmine [name] has the diagnosis of obesity and her BMI today is 47.91 Jasmine [name] is in the action stage  of change   ADVISE: Jasmine [name] was educated on the multiple health risks of obesity as well as the benefit of weight loss to improve her health. She was advised of the need for long term treatment and the importance of lifestyle modifications.  AGREE: Multiple dietary modification options and treatment options were discussed and  Jasmine [name] agreed to the above obesity treatment plan.  I, Jasmine [name], am acting as transcriptionist for Filbert SchilderAlexandria U. Kadolph, MD  I have reviewed the above documentation for accuracy and completeness, and I agree with the above. - Debbra RidingAlexandria Kadolph, MD

## 2017-10-26 ENCOUNTER — Other Ambulatory Visit (HOSPITAL_COMMUNITY): Payer: PRIVATE HEALTH INSURANCE
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
## 2017-10-27 ENCOUNTER — Telehealth (INDEPENDENT_AMBULATORY_CARE_PROVIDER_SITE_OTHER): Payer: Self-pay

## 2017-10-27 DIAGNOSIS — E559 Vitamin D deficiency, unspecified: Secondary | ICD-10-CM

## 2017-10-27 DIAGNOSIS — D509 Iron deficiency anemia, unspecified: Secondary | ICD-10-CM

## 2017-10-28 LAB — ANEMIA PANEL
FERRITIN: 48 ng/mL (ref 15–150)
Folate, Hemolysate: 373 ng/mL
Folate, RBC: 949 ng/mL (ref >498)
Hematocrit: 39.3 % (ref 34.0–46.6)
Iron Saturation: 20 % (ref 15–55)
Iron: 76 ug/dL (ref 27–159)
RETIC CT PCT: 1.2 % (ref 0.6–2.6)
Total Iron Binding Capacity: 375 ug/dL (ref 250–450)
UIBC: 299 ug/dL (ref 131–425)
Vitamin B-12: 206 pg/mL — ABNORMAL LOW (ref 232–1245)

## 2017-10-28 LAB — VITAMIN D 25 HYDROXY (VIT D DEFICIENCY, FRACTURES): VIT D 25 HYDROXY: 10.5 ng/mL — AB (ref 30.0–100.0)

## 2017-11-04 ENCOUNTER — Other Ambulatory Visit: Payer: Self-pay | Admitting: Physician Assistant

## 2017-11-04 ENCOUNTER — Ambulatory Visit (INDEPENDENT_AMBULATORY_CARE_PROVIDER_SITE_OTHER): Payer: PRIVATE HEALTH INSURANCE | Admitting: Licensed Clinical Social Worker

## 2017-11-04 DIAGNOSIS — F331 Major depressive disorder, recurrent, moderate: Secondary | ICD-10-CM | POA: Diagnosis not present

## 2017-11-04 DIAGNOSIS — I10 Essential (primary) hypertension: Secondary | ICD-10-CM

## 2017-11-04 DIAGNOSIS — F411 Generalized anxiety disorder: Secondary | ICD-10-CM | POA: Diagnosis not present

## 2017-11-04 NOTE — Progress Notes (Signed)
   THERAPIST PROGRESS NOTE  Session Time: 1:05pm-1:57pm  Participation Level: Active  Behavioral Response:  Dress was casual She was alert and focus was adequate  Mostly euthymic, but frustrated about her work situation   Type of Therapy: Individual therapy   Treatment Goals addressed: Reduce symptoms of depression and anxiety to improve daily functioning  Interventions: Assessment, validation of feelings  Suicidal/Homicidal: Denied both  Therapist Interventions:   Patient described her first couple days back at work.  Validated anger about being bullied by her supervisor.  Provided positive feedback regarding how she maintained control of her anger as she stood up for herself.  Encouraged her to focus on practicing good self-care in the next few days.       Summary:   Described having a very stressful return to work.  Felt demeaned by her supervisor who made many rude comments and was not cooperative as far as helping her return to work.  Ended up requesting a meeting with her supervisor's boss.  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  Relieved to have a few days of vacation before she returns to work on Monday.  At that time she will report to a Writernew manager.   Prior to returning to work earlier this week she was feeling nervous but she was prepared to go back.  As she had further interactions with her supervisor her level of distress increased dramatically.       Plan: Scheduled to return next week. Next treatment plan review is due 01/25/18.  Diagnosis:  MDD, recurrent, moderate                      GAD                     R/O PTSD    Darrin LuisSolomon, Janetta Vandoren A, LCSW 11/04/2017

## 2017-11-07 ENCOUNTER — Other Ambulatory Visit: Payer: Self-pay | Admitting: Physician Assistant

## 2017-11-07 DIAGNOSIS — F411 Generalized anxiety disorder: Secondary | ICD-10-CM

## 2017-11-09 ENCOUNTER — Ambulatory Visit (INDEPENDENT_AMBULATORY_CARE_PROVIDER_SITE_OTHER): Payer: 59 | Admitting: Family Medicine

## 2017-11-09 VITALS — BP 120/74 | HR 81 | Temp 98.1°F | Ht 62.0 in | Wt 265.0 lb

## 2017-11-09 DIAGNOSIS — Z9189 Other specified personal risk factors, not elsewhere classified: Secondary | ICD-10-CM | POA: Diagnosis not present

## 2017-11-09 DIAGNOSIS — N182 Chronic kidney disease, stage 2 (mild): Secondary | ICD-10-CM | POA: Diagnosis not present

## 2017-11-09 DIAGNOSIS — E1122 Type 2 diabetes mellitus with diabetic chronic kidney disease: Secondary | ICD-10-CM

## 2017-11-09 DIAGNOSIS — E559 Vitamin D deficiency, unspecified: Secondary | ICD-10-CM | POA: Diagnosis not present

## 2017-11-09 DIAGNOSIS — Z6841 Body Mass Index (BMI) 40.0 and over, adult: Secondary | ICD-10-CM | POA: Diagnosis not present

## 2017-11-09 DIAGNOSIS — I10 Essential (primary) hypertension: Secondary | ICD-10-CM | POA: Diagnosis not present

## 2017-11-09 DIAGNOSIS — Z794 Long term (current) use of insulin: Secondary | ICD-10-CM | POA: Diagnosis not present

## 2017-11-09 MED ORDER — VITAMIN D (ERGOCALCIFEROL) 1.25 MG (50000 UNIT) PO CAPS: 50000.0000 [IU] | ORAL_CAPSULE | ORAL | 0 refills | Status: DC

## 2017-11-09 MED ORDER — LOSARTAN POTASSIUM-HCTZ 50-12.5 MG PO TABS: 1.0000 | ORAL_TABLET | Freq: Every day | ORAL | 0 refills | Status: DC

## 2017-11-09 NOTE — Progress Notes (Signed)
Office: [phone]  /  Fax: [phone]   HPI:   Chief Complaint: OBESITY Jasmine [name] is here to discuss her progress with her obesity treatment plan. She is on the Category 2 plan + 100 calories and is following her eating plan approximately 65 % of the time. She states she is exercising 0 minutes 0 times per week. Jasmine [name] is finding it difficult to get all food in for the day. Doing a good job of getting all food in at dinner, not weighing meat. Her weight is 265 lb (120.2 kg) today and has gained 3 pounds since her last visit. She has lost 0 lbs since starting treatment with Jasmine [name].  Diabetes II Jasmine [name] has a diagnosis of diabetes type II. Jasmine [name] states fasting BGs in low 90's, evenings prior to bedtime in 130's. She is on Jasmine [name], Tresiba, and Trulicity. Sugar elevated in body water. She denies any hypoglycemic episodes. Last A1c was 7.7 on 10/07/17. She has been working on intensive lifestyle modifications including diet, exercise, and weight loss to help control her blood glucose levels.  Vitamin D Deficiency Jasmine [name] has a diagnosis of vitamin D deficiency. She is not on Vit D supplement currently. She notes fatigue and denies nausea, vomiting or muscle weakness.  At risk for osteopenia and osteoporosis Jasmine [name] is at higher risk of osteopenia and osteoporosis due to vitamin D deficiency.   Hypertension Jasmine [name] is a 52 y.o. female with hypertension. Jasmine [name] denies chest pain, chest pressure, or headache. She is on medications currently. She is working weight loss to help control her blood pressure with the goal of decreasing her risk of heart attack and stroke. Azha's blood pressure is currently controlled.  ALLERGIES: Allergies  Allergen Reactions  . Eggs Or Egg-Derived Products Hives and Other (See Comments)    FLU VACCINE: caused hives    . Influenza Vaccines Hives    Vaccine from Egg derived product causes Hives  . Effexor [Venlafaxine]     "felt weird"  .  Metformin And Related Swelling    ANGIOEDEMA    MEDICATIONS: Current Outpatient Medications on File Prior to Visit  Medication Sig Dispense Refill  . buPROPion (WELLBUTRIN XL) 300 MG 24 hr tablet Take 1 tablet (300 mg total) by mouth daily. 90 tablet 0  . dapagliflozin propanediol (FARXIGA) 10 MG TABS tablet Take 10 mg by mouth daily. 90 tablet 1  . Dulaglutide (TRULICITY) 1.5 MG/0.5ML SOPN Inject 1.5 mg into the skin once a week. 12 pen 1  . gabapentin (NEURONTIN) 100 MG capsule Take 1 capsule (100 mg total) by mouth 2 (two) times daily. Start with one at evening and in 2 days start twice a day 60 capsule 0  . losartan-hydrochlorothiazide (HYZAAR) 50-12.5 MG tablet Take 1 tablet by mouth daily. Must make appointment before any further refills for Blood Pressure 15 tablet 0  . sertraline (ZOLOFT) 100 MG tablet Take one and one/half tablet daily. 135 tablet 0  . simvastatin (ZOCOR) 10 MG tablet TAKE 1 TABLET (10 MG TOTAL) DAILY BY MOUTH. NEED LAB WORK FOR FUTURE REFILLS. 15 tablet 0  . TRESIBA FLEXTOUCH 100 UNIT/ML SOPN FlexTouch Pen INJECT 0.2ML INTO THE SKIN AT 10PM. INCREASE BY 2UNITS EVERY 3 DAYS TIL FASTING IS LESS THAN 120 (Patient taking differently: Inject 35 units every pm.) 30 pen 2  . TROKENDI XR 50 MG CP24 TAKE 1 CAPSULE BY MOUTH EVERY DAY 90 capsule 0  . [DISCONTINUED] DULoxetine (CYMBALTA) 20 MG capsule Take 1 capsule (20 mg  total) by mouth daily. 90 capsule 1   No current facility-administered medications on file prior to visit.     PAST MEDICAL HISTORY: Past Medical History:  Diagnosis Date  . Anxiety   . Arthritis    "knees" (03/28/2014)  . Chronic renal insufficiency    stage II (mild)  . Depression   . Headache    with high blood sugar  . Hypercholesteremia   . Hypertension   . Numbness and tingling of right arm and leg   . Seasonal allergies   . Type II diabetes mellitus (HCC)   . Umbilical hernia without mention of obstruction or gangrene   . Wears glasses      PAST SURGICAL HISTORY: Past Surgical History:  Procedure Laterality Date  . INSERTION OF MESH N/A 03/28/2014   Procedure: INSERTION OF MESH;  Surgeon: Axel Filler, MD;  Location: [facility] OR;  Service: General;  Laterality: N/A;  . INSERTION OF MESH N/A 05/08/2015   Procedure: INSERTION OF MESH;  Surgeon: Axel Filler, MD;  Location: [facility] OR;  Service: General;  Laterality: N/A;  . LAPAROSCOPIC LYSIS OF ADHESIONS N/A 05/08/2015   Procedure: LAPAROSCOPIC LYSIS OF ADHESIONS;  Surgeon: Axel Filler, MD;  Location: [facility] OR;  Service: General;  Laterality: N/A;  . MULTIPLE TOOTH EXTRACTIONS  ~ 2003  . TONSILLECTOMY    . UMBILICAL HERNIA REPAIR  1973  . VENTRAL HERNIA REPAIR N/A 03/28/2014   Procedure: LAPAROSCOPIC VENTRAL HERNIA;  Surgeon: Axel Filler, MD;  Location: [facility] OR;  Service: General;  Laterality: N/A;  . VENTRAL HERNIA REPAIR N/A 05/08/2015   Procedure: LAPAROSCOPIC VENTRAL HERNIA REPAIR WITH MESH;  Surgeon: Axel Filler, MD;  Location: [facility] OR;  Service: General;  Laterality: N/A;    SOCIAL HISTORY: Social History   Tobacco Use  . Smoking status: Former Smoker    Packs/day: 0.25    Years: 26.00    Pack years: 6.50    Types: Cigarettes  . Smokeless tobacco: Never Used  Substance Use Topics  . Alcohol use: Yes    Comment: "have a drink a few times/year"  . Drug use: No    FAMILY HISTORY: Family History  Problem Relation Age of Onset  . Diabetes Mother   . High Cholesterol Mother   . Obesity Mother   . Diabetes Father   . Cancer Father        Prostate cancer  . Cancer Sister        Cervical cancer  . Diabetes Brother   . Bipolar disorder Brother   . Alcohol abuse Brother   . Drug abuse Brother   . Diabetes Sister   . Diabetes Brother     ROS: Review of Systems  Constitutional: Positive for malaise/fatigue. Negative for weight loss.  Cardiovascular: Negative for chest pain.       Negative chest pressure  Gastrointestinal: Negative for nausea and  vomiting.  Musculoskeletal:       Negative muscle weakness  Neurological: Negative for headaches.  Endo/Heme/Allergies:       Negative hypoglycemia    PHYSICAL EXAM: Blood pressure 120/74, pulse 81, temperature 98.1 F (36.7 C), temperature source Oral, height 5\' 2"  (1.575 m), weight 265 lb (120.2 kg), SpO2 96 %. Body mass index is 48.47 kg/m. Physical Exam  Constitutional: She is oriented to person, place, and time. She appears well-developed and well-nourished.  Cardiovascular: Normal rate.  Pulmonary/Chest: Effort normal.  Musculoskeletal: Normal range of motion.  Neurological: She is oriented to person, place, and time.  Skin: Skin is warm and dry.  Psychiatric: She has a normal mood and affect. Her behavior is normal.  Vitals reviewed.   RECENT LABS AND TESTS: BMET    Component Value Date/Time   NA 141 10/07/2017 1146   K 5.0 10/07/2017 1146   CL 105 10/07/2017 1146   CO2 23 10/07/2017 1146   GLUCOSE 124 (H) 10/07/2017 1146   GLUCOSE 96 09/09/2017 1430   BUN 12 10/07/2017 1146   CREATININE 1.28 (H) 10/07/2017 1146   CREATININE 1.50 (H) 09/09/2017 1430   CALCIUM 10.0 10/07/2017 1146   GFRNONAA 49 (L) 10/07/2017 1146   GFRNONAA 40 (L) 09/09/2017 1430   GFRAA 56 (L) 10/07/2017 1146   GFRAA 47 (L) 09/09/2017 1430   Lab Results  Component Value Date   HGBA1C 7.7 (H) 10/07/2017   HGBA1C 8.4 07/12/2017   HGBA1C 7.1 04/07/2017   HGBA1C 7.8 01/22/2017   HGBA1C 11.4 10/20/2016   Lab Results  Component Value Date   INSULIN 24.5 10/07/2017   CBC    Component Value Date/Time   WBC 5.0 10/07/2017 1146   WBC 4.7 11/26/2016 1134   RBC 5.46 (H) 10/07/2017 1146   RBC 5.60 (H) 11/26/2016 1134   HGB 12.8 10/07/2017 1146   HCT 39.3 10/27/2017 1128   PLT 288 11/26/2016 1134   MCV 72 (L) 10/07/2017 1146   MCH 23.4 (L) 10/07/2017 1146   MCH 23.2 (L) 11/26/2016 1134   MCHC 32.4 10/07/2017 1146   MCHC 31.5 (L) 11/26/2016 1134   RDW 16.5 (H) 10/07/2017 1146    LYMPHSABS 2.8 10/07/2017 1146   MONOABS 300 08/19/2016 1401   EOSABS 0.2 10/07/2017 1146   BASOSABS 0.1 10/07/2017 1146   Iron/TIBC/Ferritin/ %Sat    Component Value Date/Time   IRON 76 10/27/2017 1128   TIBC 375 10/27/2017 1128   FERRITIN 48 10/27/2017 1128   IRONPCTSAT 20 10/27/2017 1128   Lipid Panel     Component Value Date/Time   CHOL 180 10/07/2017 1146   TRIG 183 (H) 10/07/2017 1146   HDL 45 10/07/2017 1146   CHOLHDL 4 09/09/2017 1430   VLDL 41.4 (H) 09/09/2017 1430   LDLCALC 98 10/07/2017 1146   LDLDIRECT 103.0 09/09/2017 1430   Hepatic Function Panel     Component Value Date/Time   PROT 7.2 10/07/2017 1146   ALBUMIN 4.5 10/07/2017 1146   AST 11 10/07/2017 1146   ALT 13 10/07/2017 1146   ALKPHOS 73 10/07/2017 1146   BILITOT <0.2 10/07/2017 1146      Component Value Date/Time   TSH 1.230 10/07/2017 1146   TSH 1.36 11/26/2016 1134   TSH 0.860 02/08/2015 0945  Results for SHANEISHA, BURKEL (MRN [id number]) as of 11/09/2017 17:53  Ref. Range 10/27/2017 11:28  Vitamin D, 25-Hydroxy Latest Ref Range: 30.0 - 100.0 ng/mL 10.5 (L)    ASSESSMENT AND PLAN: Type 2 diabetes mellitus with stage 2 chronic kidney disease, with long-term current use of insulin (HCC)  Vitamin D deficiency - Plan: Vitamin D, Ergocalciferol, (DRISDOL) 50000 units CAPS capsule  Essential hypertension  At risk for osteoporosis  Class 3 severe obesity with serious comorbidity and body mass index (BMI) of 45.0 to 49.9 in adult, unspecified obesity type (HCC)  Type 2 diabetes mellitus with chronic kidney disease, with long-term current use of insulin, unspecified CKD stage (HCC)  Essential hypertension, benign - Plan: losartan-hydrochlorothiazide (HYZAAR) 50-12.5 MG tablet  PLAN:  Diabetes II Jasmine [name] has been given extensive diabetes education by myself today including ideal  fasting and post-prandial blood glucose readings, individual ideal Hgb A1c goals and hypoglycemia prevention. We  discussed the importance of good blood sugar control to decrease the likelihood of diabetic complications such as nephropathy, neuropathy, limb loss, blindness, coronary artery disease, and death. We discussed the importance of intensive lifestyle modification including diet, exercise and weight loss as the first line treatment for diabetes. Jasmine [name] agrees to decrease [place] to 35 units q PM; continue checking BGs at least 2 times a day and discussed for her to let us know if hypoglycemic. Carylon agrees to follow up with our clinic in 2 weeks.  Vitamin D Deficiency Jasmine [name] was informed that low vitamin D levels contributes to fatigue and are associated with obesity, breast, and colon cancer. Jasmine [name] agrees to start prescription Vit D @50 ,000 IU every week #4 with no refills. She will follow up for routine testing of vitamin D, at least 2-3 times per year. She was informed of the risk of over-replacement of vitamin D and agrees to not increase her dose unless she discusses this with [place] first. We will recheck labs in 3 months and Jasmine [name] agrees to follow up with our clinic in 2 weeks.  At risk for osteopenia and osteoporosis Jasmine [name] is at risk for osteopenia and osteoporsis due to her vitamin D deficiency. She was encouraged to take her vitamin D and follow her higher calcium diet and increase strengthening exercise to help strengthen her bones and decrease her risk of osteopenia and osteoporosis.  Hypertension We discussed sodium restriction, working on healthy weight loss, and a regular exercise program as the means to achieve improved blood pressure control. Jasmine [name] agreed with this plan and agreed to follow up as directed. We will continue to monitor her blood pressure as well as her progress with the above lifestyle modifications. Jasmine [name] agrees to continue taking losartan-hydrochlorothiazide 50-12.5 mg PO daily #30 with no refills and she will watch for signs of hypotension as she continues her  lifestyle modifications. Jasmine [name] agrees to follow up with our clinic in 2 weeks.  Obesity Jasmine [name] is currently in the action stage of change. As such, her goal is to continue with weight loss efforts She has agreed to follow the Category 2 plan + 100 calories Jasmine [name] has been instructed to work up to a goal of 150 minutes of combined cardio and strengthening exercise per week for weight loss and overall health benefits. We discussed the following Behavioral Modification Strategies today: increasing lean protein intake work on meal planning and easy cooking plans, increase H20 intake, better snacking choices, and planning for success   Jasmine [name] has agreed to follow up with our clinic in 2 weeks. She was informed of the importance of frequent follow up visits to maximize her success with intensive lifestyle modifications for her multiple health conditions.   OBESITY BEHAVIORAL INTERVENTION VISIT  Today's visit was # 3 out of 22.  Starting weight: 263 lbs Starting date: 10/07/17 Today's weight : 265 lbs  Today's date: 11/09/2017 Total lbs lost to date: 0 (Patients must lose 7 lbs in the first 6 months to continue with counseling)   ASK: We discussed the diagnosis of obesity with Jasmine Card today and Quantisha agreed to give [place] permission to discuss obesity behavioral modification therapy today.  ASSESS: Jasmine [name] has the diagnosis of obesity and her BMI today is 48.46 Ulyssa is in the action stage of change   ADVISE: Kalene was educated on the multiple health risks of obesity as well as the benefit  of weight loss to improve her health. She was advised of the need for long term treatment and the importance of lifestyle modifications.  AGREE: Multiple dietary modification options and treatment options were discussed and  Yuko agreed to the above obesity treatment plan.  I, Burt Knack, am acting as transcriptionist for Debbra Riding, MD I have reviewed the above  documentation for accuracy and completeness, and I agree with the above. - Debbra Riding, MD

## 2017-11-10 ENCOUNTER — Encounter (HOSPITAL_COMMUNITY): Payer: Self-pay | Admitting: Psychiatry

## 2017-11-10 ENCOUNTER — Ambulatory Visit (INDEPENDENT_AMBULATORY_CARE_PROVIDER_SITE_OTHER): Payer: PRIVATE HEALTH INSURANCE | Admitting: Psychiatry

## 2017-11-10 VITALS — BP 118/86 | HR 96 | Ht 62.0 in | Wt 269.0 lb

## 2017-11-10 DIAGNOSIS — Z813 Family history of other psychoactive substance abuse and dependence: Secondary | ICD-10-CM

## 2017-11-10 DIAGNOSIS — F410 Panic disorder [episodic paroxysmal anxiety] without agoraphobia: Secondary | ICD-10-CM | POA: Diagnosis not present

## 2017-11-10 DIAGNOSIS — F331 Major depressive disorder, recurrent, moderate: Secondary | ICD-10-CM

## 2017-11-10 DIAGNOSIS — Z818 Family history of other mental and behavioral disorders: Secondary | ICD-10-CM | POA: Diagnosis not present

## 2017-11-10 DIAGNOSIS — Z87891 Personal history of nicotine dependence: Secondary | ICD-10-CM

## 2017-11-10 DIAGNOSIS — Z811 Family history of alcohol abuse and dependence: Secondary | ICD-10-CM | POA: Diagnosis not present

## 2017-11-10 DIAGNOSIS — F322 Major depressive disorder, single episode, severe without psychotic features: Secondary | ICD-10-CM

## 2017-11-10 DIAGNOSIS — F411 Generalized anxiety disorder: Secondary | ICD-10-CM

## 2017-11-10 MED ORDER — GABAPENTIN 100 MG PO CAPS: 100.0000 mg | ORAL_CAPSULE | Freq: Two times a day (BID) | ORAL | 1 refills | Status: DC

## 2017-11-10 NOTE — Progress Notes (Signed)
[facility] Outpatient Follow up visit   Patient Identification: Jasmine [name] MRN:  [id number] Date of Evaluation:  11/10/2017 Referral Source: Her therapist Dominic Pea  Chief Complaint:   Chief Complaint    Follow-up; Other    I am not doing good.  Visit Diagnosis:    ICD-10-CM   1. Major depressive disorder, recurrent episode, moderate (HCC) F33.1   2. Generalized anxiety disorder F41.1   3. Severe major depression, single episode, without psychotic features (HCC) F32.2     History of Present Illness: Jasmine [name] is 52 year old African-American married, She is part of IOP now and returns for follow up   Works at [employer] and was going thru some harrassment concerns Now back to work part time it was hard but managing it now with a different boss  goes for therapy  Gets anxious but not panic Adding gaba last visit has helped      Aggravating factor: job stress  Modifying factor: husband but he is on the road mostly,    Past Psychiatric History: Patient denies any history of psychiatric inpatient treatment, suicidal attempt, self abusive behavior, traumatic brain injury or any history of abuse.  Previous Psychotropic Medications: Yes   Substance Abuse History in the last 12 months:  No.  Consequences of Substance Abuse: Negative  Past Medical History:  Past Medical History:  Diagnosis Date  . Anxiety   . Arthritis    "knees" (03/28/2014)  . Chronic renal insufficiency    stage II (mild)  . Depression   . Headache    with high blood sugar  . Hypercholesteremia   . Hypertension   . Numbness and tingling of right arm and leg   . Seasonal allergies   . Type II diabetes mellitus (HCC)   . Umbilical hernia without mention of obstruction or gangrene   . Wears glasses     Past Surgical History:  Procedure Laterality Date  . INSERTION OF MESH N/A 03/28/2014   Procedure: INSERTION OF MESH;  Surgeon: Axel Filler, MD;  Location: [facility] OR;  Service: General;  Laterality: N/A;   . INSERTION OF MESH N/A 05/08/2015   Procedure: INSERTION OF MESH;  Surgeon: Axel Filler, MD;  Location: [facility] OR;  Service: General;  Laterality: N/A;  . LAPAROSCOPIC LYSIS OF ADHESIONS N/A 05/08/2015   Procedure: LAPAROSCOPIC LYSIS OF ADHESIONS;  Surgeon: Axel Filler, MD;  Location: [facility] OR;  Service: General;  Laterality: N/A;  . MULTIPLE TOOTH EXTRACTIONS  ~ 2003  . TONSILLECTOMY    . UMBILICAL HERNIA REPAIR  1973  . VENTRAL HERNIA REPAIR N/A 03/28/2014   Procedure: LAPAROSCOPIC VENTRAL HERNIA;  Surgeon: Axel Filler, MD;  Location: [facility] OR;  Service: General;  Laterality: N/A;  . VENTRAL HERNIA REPAIR N/A 05/08/2015   Procedure: LAPAROSCOPIC VENTRAL HERNIA REPAIR WITH MESH;  Surgeon: Axel Filler, MD;  Location: [facility] OR;  Service: General;  Laterality: N/A;    Family Psychiatric History: Viewed  Family History:  Family History  Problem Relation Age of Onset  . Diabetes Mother   . High Cholesterol Mother   . Obesity Mother   . Diabetes Father   . Cancer Father        Prostate cancer  . Cancer Sister        Cervical cancer  . Diabetes Brother   . Bipolar disorder Brother   . Alcohol abuse Brother   . Drug abuse Brother   . Diabetes Sister   . Diabetes Brother     Social History:  Social History   Socioeconomic History  . Marital status: Married    Spouse name: Florencia ReasonsStephen Dawkins  . Number of children: 1  . Years of education: Not on file  . Highest education level: Not on file  Occupational History  . Not on file  Social Needs  . Financial resource strain: Not on file  . Food insecurity:    Worry: Not on file    Inability: Not on file  . Transportation needs:    Medical: Not on file    Non-medical: Not on file  Tobacco Use  . Smoking status: Former Smoker    Packs/day: 0.25    Years: 26.00    Pack years: 6.50    Types: Cigarettes  . Smokeless tobacco: Never Used  Substance and Sexual Activity  . Alcohol use: Yes    Comment: "have a drink a few  times/year"  . Drug use: No  . Sexual activity: Not Currently  Lifestyle  . Physical activity:    Days per week: Not on file    Minutes per session: Not on file  . Stress: Not on file  Relationships  . Social connections:    Talks on phone: Not on file    Gets together: Not on file    Attends religious service: Not on file    Active member of club or organization: Not on file    Attends meetings of clubs or organizations: Not on file    Relationship status: Not on file  Other Topics Concern  . Not on file  Social History Narrative  . Not on file     Allergies:   Allergies  Allergen Reactions  . Eggs Or Egg-Derived Products Hives and Other (See Comments)    FLU VACCINE: caused hives    . Influenza Vaccines Hives    Vaccine from Egg derived product causes Hives  . Effexor [Venlafaxine]     "felt weird"  . Metformin And Related Swelling    ANGIOEDEMA    Metabolic Disorder Labs: Lab Results  Component Value Date   HGBA1C 7.7 (H) 10/07/2017   MPG 384 (H) 01/09/2015   MPG 160 (H) 03/28/2014   No results found for: PROLACTIN Lab Results  Component Value Date   CHOL 180 10/07/2017   TRIG 183 (H) 10/07/2017   HDL 45 10/07/2017   CHOLHDL 4 09/09/2017   VLDL 41.4 (H) 09/09/2017   LDLCALC 98 10/07/2017   LDLCALC 116 (H) 06/16/2016     Current Medications: Current Outpatient Medications  Medication Sig Dispense Refill  . buPROPion (WELLBUTRIN XL) 300 MG 24 hr tablet Take 1 tablet (300 mg total) by mouth daily. 90 tablet 0  . dapagliflozin propanediol (FARXIGA) 10 MG TABS tablet Take 10 mg by mouth daily. 90 tablet 1  . Dulaglutide (TRULICITY) 1.5 MG/0.5ML SOPN Inject 1.5 mg into the skin once a week. 12 pen 1  . gabapentin (NEURONTIN) 100 MG capsule Take 1 capsule (100 mg total) by mouth 2 (two) times daily. Start with one at evening and in 2 days start twice a day 60 capsule 1  . losartan-hydrochlorothiazide (HYZAAR) 50-12.5 MG tablet Take 1 tablet by mouth daily.  Must make appointment before any further refills for Blood Pressure 30 tablet 0  . sertraline (ZOLOFT) 100 MG tablet Take one and one/half tablet daily. 135 tablet 0  . simvastatin (ZOCOR) 10 MG tablet TAKE 1 TABLET (10 MG TOTAL) DAILY BY MOUTH. NEED LAB WORK FOR FUTURE REFILLS. 15 tablet 0  .  TRESIBA FLEXTOUCH 100 UNIT/ML SOPN FlexTouch Pen INJECT 0.2ML INTO THE SKIN AT 10PM. INCREASE BY 2UNITS EVERY 3 DAYS TIL FASTING IS LESS THAN 120 (Patient taking differently: Inject 35 units every pm.) 30 pen 2  . TROKENDI XR 50 MG CP24 TAKE 1 CAPSULE BY MOUTH EVERY DAY 90 capsule 0  . Vitamin D, Ergocalciferol, (DRISDOL) 50000 units CAPS capsule Take 1 capsule (50,000 Units total) by mouth every 7 (seven) days. 4 capsule 0   No current facility-administered medications for this visit.       Psychiatric Specialty Exam: Review of Systems  Cardiovascular: Negative for palpitations.  Skin: Negative for rash.    Blood pressure 118/86, pulse 96, height 5\' 2"  (1.575 m), weight 269 lb (122 kg).Body mass index is 49.2 kg/m.  General Appearance: Casual and Obese  Eye Contact:  Fair  Speech:  Slow  Volume:  Decreased  Mood:  fair  Affect: constricted  Thought Process:  Goal Directed  Orientation:  Full (Time, Place, and Person)  Thought Content:  Rumination  Suicidal Thoughts:  No  Homicidal Thoughts:  No  Memory:  Immediate;   Fair Recent;   Fair Remote;   Fair  Judgement:  Good  Insight:  Good  Psychomotor Activity:  Decreased  Concentration:  Concentration: Fair and Attention Span: Fair  Recall:  Good  Fund of Knowledge:Good  Language: Good  Akathisia:  No  Handed:  Right  AIMS (if indicated):  0  Assets:  Communication Skills Desire for Improvement Housing  ADL's:  Intact  Cognition: WNL  Sleep:  fair    Treatment Plan Summary: Major depressive disorder, recurrent moderate.  Rule out bipolar disorder depressed type.    Doing better on gaba and also on wellbutrin Generalized  anxiety disorder. Continue zoloft and gaba . Doing better  Panic attacks : less frequent. Continue zoloft Fu 1 -2 m . Continue therapy  Thresa Ross, MD 4/3/20191:32 PM

## 2017-11-11 ENCOUNTER — Ambulatory Visit (HOSPITAL_COMMUNITY): Payer: Self-pay | Admitting: Licensed Clinical Social Worker

## 2017-11-16 ENCOUNTER — Other Ambulatory Visit: Payer: Self-pay | Admitting: *Deleted

## 2017-11-16 DIAGNOSIS — F411 Generalized anxiety disorder: Secondary | ICD-10-CM

## 2017-11-16 DIAGNOSIS — F330 Major depressive disorder, recurrent, mild: Secondary | ICD-10-CM

## 2017-11-16 MED ORDER — SERTRALINE HCL 100 MG PO TABS: ORAL_TABLET | ORAL | 0 refills | Status: DC

## 2017-11-18 ENCOUNTER — Ambulatory Visit (INDEPENDENT_AMBULATORY_CARE_PROVIDER_SITE_OTHER): Payer: 59 | Admitting: Family Medicine

## 2017-11-18 VITALS — BP 134/81 | HR 97 | Temp 98.4°F | Ht 62.0 in | Wt 263.0 lb

## 2017-11-18 DIAGNOSIS — Z9189 Other specified personal risk factors, not elsewhere classified: Secondary | ICD-10-CM

## 2017-11-18 DIAGNOSIS — Z794 Long term (current) use of insulin: Secondary | ICD-10-CM

## 2017-11-18 DIAGNOSIS — I10 Essential (primary) hypertension: Secondary | ICD-10-CM | POA: Diagnosis not present

## 2017-11-18 DIAGNOSIS — Z6841 Body Mass Index (BMI) 40.0 and over, adult: Secondary | ICD-10-CM

## 2017-11-18 DIAGNOSIS — E66813 Obesity, class 3: Secondary | ICD-10-CM

## 2017-11-18 DIAGNOSIS — E559 Vitamin D deficiency, unspecified: Secondary | ICD-10-CM | POA: Diagnosis not present

## 2017-11-18 DIAGNOSIS — E1122 Type 2 diabetes mellitus with diabetic chronic kidney disease: Secondary | ICD-10-CM

## 2017-11-18 DIAGNOSIS — N182 Chronic kidney disease, stage 2 (mild): Secondary | ICD-10-CM

## 2017-11-18 MED ORDER — INSULIN DEGLUDEC 100 UNIT/ML ~~LOC~~ SOPN: 35.0000 [IU] | PEN_INJECTOR | Freq: Every day | SUBCUTANEOUS | 0 refills | Status: DC

## 2017-11-19 ENCOUNTER — Ambulatory Visit (INDEPENDENT_AMBULATORY_CARE_PROVIDER_SITE_OTHER): Payer: 59 | Admitting: Sports Medicine

## 2017-11-19 ENCOUNTER — Encounter: Payer: Self-pay | Admitting: Sports Medicine

## 2017-11-19 DIAGNOSIS — M170 Bilateral primary osteoarthritis of knee: Secondary | ICD-10-CM

## 2017-11-19 NOTE — Assessment & Plan Note (Signed)
Left knee pain, previous injection was 2 years ago, repeat left knee injection, FMLA paperwork filled out. Return to see me in 1 month, Visco supplementation if no better.

## 2017-11-19 NOTE — Progress Notes (Signed)
Subjective:    CC: Left knee pain  HPI: This is a very pleasant 52 year old female, I treated her for knee osteoarthritis about 2 years ago with an injection, she did well until recently.  Having a recurrence of pain, moderate, persistent, localized at the medial joint line of the left knee without radiation, no mechanical symptoms, no trauma, no constitutional symptoms.  She does have some FMLA paperwork that she also needs filled out.  I reviewed the past medical history, family history, social history, surgical history, and allergies today and no changes were needed.  Please see the problem list section below in epic for further details.  Past Medical History: Past Medical History:  Diagnosis Date  . Anxiety   . Arthritis    "knees" (03/28/2014)  . Chronic renal insufficiency    stage II (mild)  . Depression   . Headache    with high blood sugar  . Hypercholesteremia   . Hypertension   . Numbness and tingling of right arm and leg   . Seasonal allergies   . Type II diabetes mellitus (HCC)   . Umbilical hernia without mention of obstruction or gangrene   . Wears glasses    Past Surgical History: Past Surgical History:  Procedure Laterality Date  . INSERTION OF MESH N/A 03/28/2014   Procedure: INSERTION OF MESH;  Surgeon: Axel Filler, MD;  Location: [facility] OR;  Service: General;  Laterality: N/A;  . INSERTION OF MESH N/A 05/08/2015   Procedure: INSERTION OF MESH;  Surgeon: Axel Filler, MD;  Location: [facility] OR;  Service: General;  Laterality: N/A;  . LAPAROSCOPIC LYSIS OF ADHESIONS N/A 05/08/2015   Procedure: LAPAROSCOPIC LYSIS OF ADHESIONS;  Surgeon: Axel Filler, MD;  Location: [facility] OR;  Service: General;  Laterality: N/A;  . MULTIPLE TOOTH EXTRACTIONS  ~ 2003  . TONSILLECTOMY    . UMBILICAL HERNIA REPAIR  1973  . VENTRAL HERNIA REPAIR N/A 03/28/2014   Procedure: LAPAROSCOPIC VENTRAL HERNIA;  Surgeon: Axel Filler, MD;  Location: [facility] OR;  Service: General;  Laterality: N/A;   . VENTRAL HERNIA REPAIR N/A 05/08/2015   Procedure: LAPAROSCOPIC VENTRAL HERNIA REPAIR WITH MESH;  Surgeon: Axel Filler, MD;  Location: [facility] OR;  Service: General;  Laterality: N/A;   Social History: Social History   Socioeconomic History  . Marital status: Married    Spouse name: Florencia Reasons  . Number of children: 1  . Years of education: Not on file  . Highest education level: Not on file  Occupational History  . Not on file  Social Needs  . Financial resource strain: Not on file  . Food insecurity:    Worry: Not on file    Inability: Not on file  . Transportation needs:    Medical: Not on file    Non-medical: Not on file  Tobacco Use  . Smoking status: Former Smoker    Packs/day: 0.25    Years: 26.00    Pack years: 6.50    Types: Cigarettes  . Smokeless tobacco: Never Used  Substance and Sexual Activity  . Alcohol use: Yes    Comment: "have a drink a few times/year"  . Drug use: No  . Sexual activity: Not Currently  Lifestyle  . Physical activity:    Days per week: Not on file    Minutes per session: Not on file  . Stress: Not on file  Relationships  . Social connections:    Talks on phone: Not on file    Gets together: Not  on file    Attends religious service: Not on file    Active member of club or organization: Not on file    Attends meetings of clubs or organizations: Not on file    Relationship status: Not on file  Other Topics Concern  . Not on file  Social History Narrative  . Not on file   Family History: Family History  Problem Relation Age of Onset  . Diabetes Mother   . High Cholesterol Mother   . Obesity Mother   . Diabetes Father   . Cancer Father        Prostate cancer  . Cancer Sister        Cervical cancer  . Diabetes Brother   . Bipolar disorder Brother   . Alcohol abuse Brother   . Drug abuse Brother   . Diabetes Sister   . Diabetes Brother    Allergies: Allergies  Allergen Reactions  . Eggs Or Egg-Derived Products  Hives and Other (See Comments)    FLU VACCINE: caused hives    . Influenza Vaccines Hives    Vaccine from Egg derived product causes Hives  . Effexor [Venlafaxine]     "felt weird"  . Metformin And Related Swelling    ANGIOEDEMA   Medications: See med rec.  Review of Systems: No fevers, chills, night sweats, weight loss, chest pain, or shortness of breath.   Objective:    General: Well Developed, well nourished, and in no acute distress.  Neuro: Alert and oriented x3, extra-ocular muscles intact, sensation grossly intact.  HEENT: Normocephalic, atraumatic, pupils equal round reactive to light, neck supple, no masses, no lymphadenopathy, thyroid nonpalpable.  Skin: Warm and dry, no rashes. Cardiac: Regular rate and rhythm, no murmurs rubs or gallops, no lower extremity edema.  Respiratory: Clear to auscultation bilaterally. Not using accessory muscles, speaking in full sentences. Left knee: Normal to inspection with no erythema or effusion or obvious bony abnormalities. Tenderness at the medial joint line and lateral patellar facet ROM normal in flexion and extension and lower leg rotation. Ligaments with solid consistent endpoints including ACL, PCL, LCL, MCL. Negative Mcmurray's and provocative meniscal tests. Non painful patellar compression. Patellar and quadriceps tendons unremarkable. Hamstring and quadriceps strength is normal.  Procedure: Real-time Ultrasound Guided Injection of left knee Device: GE Logiq E  Verbal informed consent obtained.  Time-out conducted.  Noted no overlying erythema, induration, or other signs of local infection.  Skin prepped in a sterile fashion.  Local anesthesia: Topical Ethyl chloride.  With sterile technique and under real time ultrasound guidance: 1 cc Kenalog 40, 2 cc lidocaine, 2 cc bupivacaine injected easily Completed without difficulty  Pain immediately resolved suggesting accurate placement of the medication.  Advised to call if  fevers/chills, erythema, induration, drainage, or persistent bleeding.  Images permanently stored and available for review in the ultrasound unit.  Impression: Technically successful ultrasound guided injection.  Impression and Recommendations:    Primary osteoarthritis of both knees Left knee pain, previous injection was 2 years ago, repeat left knee injection, FMLA paperwork filled out. Return to see me in 1 month, Visco supplementation if no better.  ___________________________________________ Ihor Austinhomas J. Benjamin Stainhekkekandam, M.D., ABFM., CAQSM. Primary Care and Sports Medicine [facility]  Adjunct Instructor of Family Medicine  [employer]

## 2017-11-22 ENCOUNTER — Ambulatory Visit (INDEPENDENT_AMBULATORY_CARE_PROVIDER_SITE_OTHER): Payer: PRIVATE HEALTH INSURANCE | Admitting: Licensed Clinical Social Worker

## 2017-11-22 ENCOUNTER — Ambulatory Visit (INDEPENDENT_AMBULATORY_CARE_PROVIDER_SITE_OTHER): Payer: 59 | Admitting: Family Medicine

## 2017-11-22 DIAGNOSIS — F4310 Post-traumatic stress disorder, unspecified: Secondary | ICD-10-CM | POA: Diagnosis not present

## 2017-11-22 DIAGNOSIS — F331 Major depressive disorder, recurrent, moderate: Secondary | ICD-10-CM | POA: Diagnosis not present

## 2017-11-22 DIAGNOSIS — F411 Generalized anxiety disorder: Secondary | ICD-10-CM

## 2017-11-22 NOTE — Progress Notes (Signed)
   THERAPIST PROGRESS NOTE  Session Time: 1:03pm-2:10pm  Participation Level: Active  Behavioral Response:  Dress was casual She was alert and focus was adequate  Mostly euthymic   Type of Therapy: Individual therapy   Treatment Goals addressed: Reduce symptoms of depression and anxiety to improve daily functioning  Interventions: Assessment, psycho-ed about PTSD  Suicidal/Homicidal: Denied both  Therapist Interventions:   Gathered information about concerns related to fatigue and poor focus.  Explored possible contributing factors for having these issues. Had patient complete a PCL-5 to assess for presence and severity of PTSD-related symptoms.  Briefly reviewed the symptom clusters of re-experiencing, avoidance, and hyperarousal.        Summary:   Her main concern today was having excessive fatigue.  She has been working 4 hour days in the morning.  Reported about 3 hours into her workday she has been experiencing panic symptoms (sweating, restlessness, increased heart rate).  Her new manager has noticed her struggling with anxiety.  He has been patient and understanding with her.  By the time she gets home from work she feels exhausted.  She has been laying down at 1pm and staying asleep until 11pm.  She then has to be up at 6am to be at work on time. Talked about having difficulties with learning as she is going through training at her job.  While she was out there were a lot of changes.  Noted she is not permitted to take notes during training and during the time she is to observe a coworker on the phone she is unable to see the computer screen.  She asked the coworker to increase the font size but they weren't willing to do that.  Has asked for opportunities for hands on learning so she can go at her own pace, but they haven't been accommodating as far as that goes.  She is hoping after training is complete she will have the ability to learn the material in the manner she would  prefer.    Score on the PCL-5 was 32.  She endorsed symptoms in all symptom clusters.  Items she rated as bothering her "quite a bit" or "extremely" in the past month were: avoidance of external reminders, hypervigilance, feeling jumpy/easily startled, and difficulties with concentration.  She noted when she completed the measure she was thinking about more than one traumatic event she experienced.                    Plan: Scheduled to return next week.  Will plan to discuss the fight-flight-freeze response and how it related to PTSD. Next treatment plan review is due 01/25/18.  Diagnosis:  MDD, recurrent, moderate                      GAD                      PTSD    Darrin LuisSolomon, Sarah A, LCSW 11/22/2017

## 2017-11-22 NOTE — Progress Notes (Signed)
Office: [phone]  /  Fax: [phone]   HPI:   Chief Complaint: OBESITY [name] is here to discuss her progress with her obesity treatment plan. She is on the Category 2 plan + 100 calories and is following her eating plan approximately 65 % of the time. She states she is exercising 0 minutes 0 times per week. Brook did really well eating on plan. Indulged in 2 pieces of cake over last 2 weeks. Feeling overall well.  Her weight is 263 lb (119.3 kg) today and has had a weight loss of 2 pounds over a period of 2 weeks since her last visit. She has lost 0 lbs since starting treatment with us.  Diabetes II [name] has a diagnosis of diabetes type II. Alta states fasting BGs range between 97 and 110 and she denies any hypoglycemic episodes. Last A1c was 7.7 on 10/07/17. She has been working on intensive lifestyle modifications including diet, exercise, and weight loss to help control her blood glucose levels.  Hypertension Stormy CardYolanda E Hoffmeier is a 52 y.o. female with hypertension. Idamae's blood pressure is controlled. She denies chest pain. She is working weight loss to help control her blood pressure with the goal of decreasing her risk of heart attack and stroke.  Vitamin D Deficiency [name] has a diagnosis of vitamin D deficiency. She is currently taking prescription Vit D. She notes improving fatigue and denies nausea, vomiting or muscle weakness.  At risk for osteopenia and osteoporosis [name] is at higher risk of osteopenia and osteoporosis due to vitamin D deficiency.   ALLERGIES: Allergies  Allergen Reactions  . Eggs Or Egg-Derived Products Hives and Other (See Comments)    FLU VACCINE: caused hives    . Influenza Vaccines Hives    Vaccine from Egg derived product causes Hives  . Effexor [Venlafaxine]     "felt weird"  . Metformin And Related Swelling    ANGIOEDEMA    MEDICATIONS: Current Outpatient Medications on File Prior to Visit  Medication Sig Dispense  Refill  . buPROPion (WELLBUTRIN XL) 300 MG 24 hr tablet Take 1 tablet (300 mg total) by mouth daily. 90 tablet 0  . dapagliflozin propanediol (FARXIGA) 10 MG TABS tablet Take 10 mg by mouth daily. 90 tablet 1  . Dulaglutide (TRULICITY) 1.5 MG/0.5ML SOPN Inject 1.5 mg into the skin once a week. 12 pen 1  . gabapentin (NEURONTIN) 100 MG capsule Take 1 capsule (100 mg total) by mouth 2 (two) times daily. Start with one at evening and in 2 days start twice a day 60 capsule 1  . losartan-hydrochlorothiazide (HYZAAR) 50-12.5 MG tablet Take 1 tablet by mouth daily. Must make appointment before any further refills for Blood Pressure 30 tablet 0  . sertraline (ZOLOFT) 100 MG tablet Take one and one/half tablet daily. 135 tablet 0  . simvastatin (ZOCOR) 10 MG tablet TAKE 1 TABLET (10 MG TOTAL) DAILY BY MOUTH. NEED LAB WORK FOR FUTURE REFILLS. 15 tablet 0  . TROKENDI XR 50 MG CP24 TAKE 1 CAPSULE BY MOUTH EVERY DAY 90 capsule 0  . Vitamin D, Ergocalciferol, (DRISDOL) 50000 units CAPS capsule Take 1 capsule (50,000 Units total) by mouth every 7 (seven) days. 4 capsule 0  . [DISCONTINUED] DULoxetine (CYMBALTA) 20 MG capsule Take 1 capsule (20 mg total) by mouth daily. 90 capsule 1   No current facility-administered medications on file prior to visit.     PAST MEDICAL HISTORY: Past Medical History:  Diagnosis Date  . Anxiety   .  Arthritis    "knees" (03/28/2014)  . Chronic renal insufficiency    stage II (mild)  . Depression   . Headache    with high blood sugar  . Hypercholesteremia   . Hypertension   . Numbness and tingling of right arm and leg   . Seasonal allergies   . Type II diabetes mellitus (HCC)   . Umbilical hernia without mention of obstruction or gangrene   . Wears glasses     PAST SURGICAL HISTORY: Past Surgical History:  Procedure Laterality Date  . INSERTION OF MESH N/A 03/28/2014   Procedure: INSERTION OF MESH;  Surgeon: Axel Filler, MD;  Location: [facility] OR;  Service: General;   Laterality: N/A;  . INSERTION OF MESH N/A 05/08/2015   Procedure: INSERTION OF MESH;  Surgeon: Axel Filler, MD;  Location: [facility] OR;  Service: General;  Laterality: N/A;  . LAPAROSCOPIC LYSIS OF ADHESIONS N/A 05/08/2015   Procedure: LAPAROSCOPIC LYSIS OF ADHESIONS;  Surgeon: Axel Filler, MD;  Location: [facility] OR;  Service: General;  Laterality: N/A;  . MULTIPLE TOOTH EXTRACTIONS  ~ 2003  . TONSILLECTOMY    . UMBILICAL HERNIA REPAIR  1973  . VENTRAL HERNIA REPAIR N/A 03/28/2014   Procedure: LAPAROSCOPIC VENTRAL HERNIA;  Surgeon: Axel Filler, MD;  Location: [facility] OR;  Service: General;  Laterality: N/A;  . VENTRAL HERNIA REPAIR N/A 05/08/2015   Procedure: LAPAROSCOPIC VENTRAL HERNIA REPAIR WITH MESH;  Surgeon: Axel Filler, MD;  Location: [facility] OR;  Service: General;  Laterality: N/A;    SOCIAL HISTORY: Social History   Tobacco Use  . Smoking status: Former Smoker    Packs/day: 0.25    Years: 26.00    Pack years: 6.50    Types: Cigarettes  . Smokeless tobacco: Never Used  Substance Use Topics  . Alcohol use: Yes    Comment: "have a drink a few times/year"  . Drug use: No    FAMILY HISTORY: Family History  Problem Relation Age of Onset  . Diabetes Mother   . High Cholesterol Mother   . Obesity Mother   . Diabetes Father   . Cancer Father        Prostate cancer  . Cancer Sister        Cervical cancer  . Diabetes Brother   . Bipolar disorder Brother   . Alcohol abuse Brother   . Drug abuse Brother   . Diabetes Sister   . Diabetes Brother     ROS: Review of Systems  Constitutional: Positive for malaise/fatigue and weight loss.  Cardiovascular: Negative for chest pain.  Gastrointestinal: Negative for nausea and vomiting.  Musculoskeletal:       Negative muscle weakness  Endo/Heme/Allergies:       Negative hypoglycemia    PHYSICAL EXAM: Blood pressure 134/81, pulse 97, temperature 98.4 F (36.9 C), temperature source Oral, height 5\' 2"  (1.575 m), weight 263 lb  (119.3 kg), SpO2 98 %. Body mass index is 48.1 kg/m. Physical Exam  Constitutional: She is oriented to person, place, and time. She appears well-developed and well-nourished.  Cardiovascular: Normal rate.  Pulmonary/Chest: Effort normal.  Musculoskeletal: Normal range of motion.  Neurological: She is oriented to person, place, and time.  Skin: Skin is warm and dry.  Psychiatric: She has a normal mood and affect. Her behavior is normal.  Vitals reviewed.   RECENT LABS AND TESTS: BMET    Component Value Date/Time   NA 141 10/07/2017 1146   K 5.0 10/07/2017 1146   CL 105 10/07/2017  1146   CO2 23 10/07/2017 1146   GLUCOSE 124 (H) 10/07/2017 1146   GLUCOSE 96 09/09/2017 1430   BUN 12 10/07/2017 1146   CREATININE 1.28 (H) 10/07/2017 1146   CREATININE 1.50 (H) 09/09/2017 1430   CALCIUM 10.0 10/07/2017 1146   GFRNONAA 49 (L) 10/07/2017 1146   GFRNONAA 40 (L) 09/09/2017 1430   GFRAA 56 (L) 10/07/2017 1146   GFRAA 47 (L) 09/09/2017 1430   Lab Results  Component Value Date   HGBA1C 7.7 (H) 10/07/2017   HGBA1C 8.4 07/12/2017   HGBA1C 7.1 04/07/2017   HGBA1C 7.8 01/22/2017   HGBA1C 11.4 10/20/2016   Lab Results  Component Value Date   INSULIN 24.5 10/07/2017   CBC    Component Value Date/Time   WBC 5.0 10/07/2017 1146   WBC 4.7 11/26/2016 1134   RBC 5.46 (H) 10/07/2017 1146   RBC 5.60 (H) 11/26/2016 1134   HGB 12.8 10/07/2017 1146   HCT 39.3 10/27/2017 1128   PLT 288 11/26/2016 1134   MCV 72 (L) 10/07/2017 1146   MCH 23.4 (L) 10/07/2017 1146   MCH 23.2 (L) 11/26/2016 1134   MCHC 32.4 10/07/2017 1146   MCHC 31.5 (L) 11/26/2016 1134   RDW 16.5 (H) 10/07/2017 1146   LYMPHSABS 2.8 10/07/2017 1146   MONOABS 300 08/19/2016 1401   EOSABS 0.2 10/07/2017 1146   BASOSABS 0.1 10/07/2017 1146   Iron/TIBC/Ferritin/ %Sat    Component Value Date/Time   IRON 76 10/27/2017 1128   TIBC 375 10/27/2017 1128   FERRITIN 48 10/27/2017 1128   IRONPCTSAT 20 10/27/2017 1128   Lipid  Panel     Component Value Date/Time   CHOL 180 10/07/2017 1146   TRIG 183 (H) 10/07/2017 1146   HDL 45 10/07/2017 1146   CHOLHDL 4 09/09/2017 1430   VLDL 41.4 (H) 09/09/2017 1430   LDLCALC 98 10/07/2017 1146   LDLDIRECT 103.0 09/09/2017 1430   Hepatic Function Panel     Component Value Date/Time   PROT 7.2 10/07/2017 1146   ALBUMIN 4.5 10/07/2017 1146   AST 11 10/07/2017 1146   ALT 13 10/07/2017 1146   ALKPHOS 73 10/07/2017 1146   BILITOT <0.2 10/07/2017 1146      Component Value Date/Time   TSH 1.230 10/07/2017 1146   TSH 1.36 11/26/2016 1134   TSH 0.860 02/08/2015 0945  Results for KLA, BILY (MRN [id number]) as of 11/22/2017 08:28  Ref. Range 10/27/2017 11:28  Vitamin D, 25-Hydroxy Latest Ref Range: 30.0 - 100.0 ng/mL 10.5 (L)    ASSESSMENT AND PLAN: Type 2 diabetes mellitus with stage 2 chronic kidney disease, with long-term current use of insulin (HCC)  Essential hypertension  Vitamin D deficiency  At risk for osteoporosis  Class 3 severe obesity with serious comorbidity and body mass index (BMI) of 45.0 to 49.9 in adult, unspecified obesity type (HCC)  Type 2 diabetes mellitus with chronic kidney disease, with long-term current use of insulin, unspecified CKD stage (HCC) - Plan: insulin degludec (TRESIBA FLEXTOUCH) 100 UNIT/ML SOPN FlexTouch Pen  PLAN:  Diabetes II Sharia has been given extensive diabetes education by myself today including ideal fasting and post-prandial blood glucose readings, individual ideal Hgb A1c goals and hypoglycemia prevention. We discussed the importance of good blood sugar control to decrease the likelihood of diabetic complications such as nephropathy, neuropathy, limb loss, blindness, coronary artery disease, and death. We discussed the importance of intensive lifestyle modification including diet, exercise and weight loss as the first line treatment for  diabetes. Quantina agrees to decrease [place] to 35 units q PM and we will  refill Tresiba 5 pens/#1 box with no refills. Psalm agrees to follow up with our clinic in 2 weeks.  Hypertension We discussed sodium restriction, working on healthy weight loss, and a regular exercise program as the means to achieve improved blood pressure control. Janita agreed with this plan and agreed to follow up as directed. We will continue to monitor her blood pressure as well as her progress with the above lifestyle modifications. She will continue her medications as prescribed and will watch for signs of hypotension as she continues her lifestyle modifications. Shevelle agrees to follow up with our clinic in 2 weeks.  Vitamin D Deficiency Cherity was informed that low vitamin D levels contributes to fatigue and are associated with obesity, breast, and colon cancer. Tzirel agrees to continue taking prescription Vit D @50 ,000 IU every week, no refill needed and will follow up for routine testing of vitamin D, at least 2-3 times per year. She was informed of the risk of over-replacement of vitamin D and agrees to not increase her dose unless she discusses this with [place] first. Kaycee agrees to follow up with our clinic in 2 weeks.  At risk for osteopenia and osteoporosis Eadie is at risk for osteopenia and osteoporsis due to her vitamin D deficiency. She was encouraged to take her vitamin D and follow her higher calcium diet and increase strengthening exercise to help strengthen her bones and decrease her risk of osteopenia and osteoporosis.  Obesity Avalina is currently in the action stage of change. As such, her goal is to continue with weight loss efforts She has agreed to follow the Category 2 plan + 100 calories Olubunmi has been instructed to work up to a goal of 150 minutes of combined cardio and strengthening exercise per week for weight loss and overall health benefits. We discussed the following Behavioral Modification Strategies today: increasing lean protein intake, increasing  vegetables, work on meal planning and easy cooking plans, and planning for success   Zacaria has agreed to follow up with our clinic in 2 weeks. She was informed of the importance of frequent follow up visits to maximize her success with intensive lifestyle modifications for her multiple health conditions.   OBESITY BEHAVIORAL INTERVENTION VISIT  Today's visit was # 4 out of 22.  Starting weight: 263 lbs Starting date: 10/07/17 Today's weight : 263 lbs Today's date: 11/18/2017 Total lbs lost to date: 0 (Patients must lose 7 lbs in the first 6 months to continue with counseling)   ASK: We discussed the diagnosis of obesity with [name] today and Tatisha agreed to give [place] permission to discuss obesity behavioral modification therapy today.  ASSESS: Tascha has the diagnosis of obesity and her BMI today is 48.09 Ashlen is in the action stage of change   ADVISE: Machell was educated on the multiple health risks of obesity as well as the benefit of weight loss to improve her health. She was advised of the need for long term treatment and the importance of lifestyle modifications.  AGREE: Multiple dietary modification options and treatment options were discussed and  Keyonna agreed to the above obesity treatment plan.  I, Burt Knack, am acting as transcriptionist for Debbra Riding, MD  I have reviewed the above documentation for accuracy and completeness, and I agree with the above. - Debbra Riding, MD

## 2017-11-23 ENCOUNTER — Telehealth (HOSPITAL_COMMUNITY): Payer: Self-pay | Admitting: Licensed Clinical Social Worker

## 2017-11-23 NOTE — Telephone Encounter (Signed)
I sent a letter yesterday.

## 2017-11-23 NOTE — Telephone Encounter (Signed)
Pt needs a letter extending her restrictions sent to [employer].

## 2017-11-24 ENCOUNTER — Encounter: Payer: Self-pay | Admitting: Sports Medicine

## 2017-11-24 NOTE — Telephone Encounter (Signed)
Informed patient of sending a letter along with progress notes to [employer] on Monday.  Patient said she would check with them to make sure they got it.

## 2017-11-24 NOTE — Telephone Encounter (Signed)
Please call the patient to discuss.

## 2017-11-28 ENCOUNTER — Other Ambulatory Visit: Payer: Self-pay | Admitting: Physician Assistant

## 2017-11-28 DIAGNOSIS — I10 Essential (primary) hypertension: Secondary | ICD-10-CM

## 2017-11-29 ENCOUNTER — Encounter: Payer: Self-pay | Admitting: Sports Medicine

## 2017-11-30 ENCOUNTER — Ambulatory Visit (INDEPENDENT_AMBULATORY_CARE_PROVIDER_SITE_OTHER): Payer: PRIVATE HEALTH INSURANCE | Admitting: Licensed Clinical Social Worker

## 2017-11-30 DIAGNOSIS — F4310 Post-traumatic stress disorder, unspecified: Secondary | ICD-10-CM | POA: Diagnosis not present

## 2017-11-30 DIAGNOSIS — F411 Generalized anxiety disorder: Secondary | ICD-10-CM | POA: Diagnosis not present

## 2017-11-30 DIAGNOSIS — F331 Major depressive disorder, recurrent, moderate: Secondary | ICD-10-CM | POA: Diagnosis not present

## 2017-11-30 NOTE — Progress Notes (Signed)
   THERAPIST PROGRESS NOTE  Session Time: 2:04pm-3:04pm  Participation Level: Active  Behavioral Response:  Dress was casual She was alert and focus was adequate  Mostly euthymic   Type of Therapy: Individual therapy   Treatment Goals addressed: Reduce symptoms of depression and anxiety to improve daily functioning  Interventions: Psycho-ed about fight or flight, exploring beliefs  Suicidal/Homicidal: Denied both  Therapist Interventions:   Educated patient about the fight-flight-or freeze response.  Explained how it is activated whenever you think there is a potential threat.  Noted this happens whether the threat is real or not.  Reviewed bodily changes that occur in this state.  Emphasized that panic symptoms are not dangerous.   Discussed how beliefs can change after trauma.  Noted that part of treatment involves challenging patient to identify and change her beliefs about her trauma.        Summary:   Reported things at work have improved.  Feeling more positive about moving forward in her job.  Noted that during times when she isn't busy her mind sometimes gets stuck on worries.   Reported she had learned some things about the fight or flight response in [facility].  Indicated she appreciated therapist taking time to explain it in more detail.  Was able to describe a recent situation when the response was triggered in her. Indicated she is open to exploring how her beliefs about her experiences have contributed to her feeling stuck.            Plan: Scheduled to return next week.  Will introduce the idea of stuck points.   Next treatment plan review is due 01/25/18.  Diagnosis:  PTSD                      GAD                      MDD, recurrent, moderate    Darrin LuisSolomon, Sarah A, LCSW 11/30/2017

## 2017-12-06 ENCOUNTER — Ambulatory Visit (HOSPITAL_COMMUNITY): Payer: Self-pay | Admitting: Licensed Clinical Social Worker

## 2017-12-07 ENCOUNTER — Ambulatory Visit (INDEPENDENT_AMBULATORY_CARE_PROVIDER_SITE_OTHER): Payer: 59 | Admitting: Physician Assistant

## 2017-12-07 ENCOUNTER — Ambulatory Visit (INDEPENDENT_AMBULATORY_CARE_PROVIDER_SITE_OTHER): Payer: 59 | Admitting: Family Medicine

## 2017-12-07 ENCOUNTER — Ambulatory Visit: Payer: Self-pay | Admitting: Internal Medicine

## 2017-12-07 ENCOUNTER — Other Ambulatory Visit: Payer: Self-pay | Admitting: Osteopathic Medicine

## 2017-12-07 ENCOUNTER — Encounter (INDEPENDENT_AMBULATORY_CARE_PROVIDER_SITE_OTHER): Payer: Self-pay

## 2017-12-09 ENCOUNTER — Telehealth (HOSPITAL_COMMUNITY): Payer: Self-pay | Admitting: Licensed Clinical Social Worker

## 2017-12-09 NOTE — Telephone Encounter (Signed)
Pt would like to speak with [name]

## 2017-12-09 NOTE — Telephone Encounter (Signed)
Therapist returned call from patient.  Patient reported she was at [facility] with her husband because he has been diagnosed with eye cancer and needs to see a specialist there.  He may have to stay for 6-7 weeks.  She has taken this week off of work.  Explained she plans to attend her psychotherapy appointment scheduled for this Monday, but will call to reschedule if necessary.

## 2017-12-12 ENCOUNTER — Other Ambulatory Visit: Payer: Self-pay | Admitting: Physician Assistant

## 2017-12-12 DIAGNOSIS — F411 Generalized anxiety disorder: Secondary | ICD-10-CM

## 2017-12-13 ENCOUNTER — Ambulatory Visit (HOSPITAL_COMMUNITY): Payer: Self-pay | Admitting: Licensed Clinical Social Worker

## 2017-12-17 ENCOUNTER — Ambulatory Visit: Payer: Self-pay | Admitting: Sports Medicine

## 2017-12-24 ENCOUNTER — Ambulatory Visit (HOSPITAL_COMMUNITY): Payer: Self-pay | Admitting: Licensed Clinical Social Worker

## 2017-12-24 ENCOUNTER — Ambulatory Visit (INDEPENDENT_AMBULATORY_CARE_PROVIDER_SITE_OTHER): Payer: PRIVATE HEALTH INSURANCE | Admitting: Licensed Clinical Social Worker

## 2017-12-24 DIAGNOSIS — F332 Major depressive disorder, recurrent severe without psychotic features: Secondary | ICD-10-CM | POA: Diagnosis not present

## 2017-12-24 DIAGNOSIS — F411 Generalized anxiety disorder: Secondary | ICD-10-CM | POA: Diagnosis not present

## 2017-12-24 DIAGNOSIS — F4310 Post-traumatic stress disorder, unspecified: Secondary | ICD-10-CM

## 2017-12-24 NOTE — Progress Notes (Signed)
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  Emphasized to patient that she can't possibly handle all of the tasks that are required for his care and stay on top of her own care as well.  Discussed potential resources like a home health aide, transportation, family/friends, or connecting with local cancer groups.  Acknowledged how she needs to prioritize tasks and will have to put some of her own goals on hold.   Had patient complete a PHQ9 and GAD7 to assess for current symptom severity.   Summary:   Reported her husband has been diagnosed with a very rare form of cancer.  He needs to get treatment at [facility] 5 days per week.  He can't see out of one of his eyes.  He needs assistance to walk.  Patient has taken on the primary burdens of his care as no one else has agreed to help, at least not consistently.  Meanwhile she went back to work this week.  She is working half days.  Not able to concentrate on work tasks.  Would consider taking a leave of absence but doing so would cause them to lose their health insurance.  FMLA is also not an option at this time because she used up all of her time for the year. Under current stressors her symptoms of depression and anxiety have increased:  Depression screen [facility] 2/9 12/24/2017 10/25/2017 10/07/2017 07/27/2017 06/23/2017  Decreased Interest Down, Depressed, Hopeless PHQ - 2  Score Altered sleeping 3 3 0 3 3  Tired, decreased energy Change in appetite Feeling bad or failure about yourself  Trouble concentrating Moving slowly or fidgety/restless Suicidal thoughts 0 0 0 0 0  PHQ-9 Score Difficult doing work/chores - - Somewhat difficult - -   GAD 7 : Generalized Anxiety Score 12/24/2017 10/25/2017 07/27/2017 06/23/2017  Nervous, Anxious, on Edge Control/stop worrying Worry too much - different things Trouble relaxing Restless Easily annoyed or irritable Afraid - awful might happen Total GAD 7 Score Anxiety Difficulty Somewhat difficult Somewhat difficult Very difficult Extremely difficult               Plan: We will put Cognitive Processing Therapy on hold while she is dealing with significant life stressors.   Next treatment plan review is due 01/25/18.  Diagnosis:  PTSD                      GAD  MDD, recurrent, severe    Darrin Luis 12/24/2017

## 2017-12-27 NOTE — Telephone Encounter (Signed)
Disregard

## 2017-12-30 ENCOUNTER — Encounter: Payer: Self-pay | Admitting: Sports Medicine

## 2018-01-04 ENCOUNTER — Ambulatory Visit (HOSPITAL_COMMUNITY): Payer: Self-pay | Admitting: Psychiatry

## 2018-01-06 ENCOUNTER — Ambulatory Visit (INDEPENDENT_AMBULATORY_CARE_PROVIDER_SITE_OTHER): Payer: 59 | Admitting: Family Medicine

## 2018-01-06 VITALS — BP 141/84 | HR 105 | Temp 98.4°F | Ht 62.0 in | Wt 262.0 lb

## 2018-01-06 DIAGNOSIS — Z6841 Body Mass Index (BMI) 40.0 and over, adult: Secondary | ICD-10-CM | POA: Diagnosis not present

## 2018-01-06 DIAGNOSIS — Z794 Long term (current) use of insulin: Secondary | ICD-10-CM

## 2018-01-06 DIAGNOSIS — E119 Type 2 diabetes mellitus without complications: Secondary | ICD-10-CM

## 2018-01-06 DIAGNOSIS — Z9189 Other specified personal risk factors, not elsewhere classified: Secondary | ICD-10-CM | POA: Diagnosis not present

## 2018-01-06 DIAGNOSIS — E66813 Obesity, class 3: Secondary | ICD-10-CM

## 2018-01-06 DIAGNOSIS — I10 Essential (primary) hypertension: Secondary | ICD-10-CM | POA: Diagnosis not present

## 2018-01-06 MED ORDER — LOSARTAN POTASSIUM-HCTZ 50-12.5 MG PO TABS: 1.0000 | ORAL_TABLET | Freq: Every day | ORAL | 1 refills | Status: DC

## 2018-01-10 ENCOUNTER — Other Ambulatory Visit (HOSPITAL_COMMUNITY): Payer: Self-pay | Admitting: Psychiatry

## 2018-01-10 NOTE — Progress Notes (Signed)
Office: [phone]  /  Fax: [phone]   HPI:   Chief Complaint: OBESITY Jasmine [name] is here to discuss her progress with her obesity treatment plan. She is on the Category 2 plan + 100 calories and is following her eating plan approximately 40 % of the time. She states she is exercising 0 minutes 0 times per week. Jasmine [name] has had sporadic eating secondary to husband recent cancer diagnosis, he has daily appointments at [facility] and is on week 3 of 40 weeks of chemotherapy.  Her weight is 262 lb (118.8 kg) today and has had a weight loss of 1 pound over a period of 7 weeks since her last visit. She has lost 1 lb since starting treatment with [place].  Hypertension Jasmine [name] is a 52 y.o. female with hypertension. Jasmine [name]'s blood pressure is controlled today. She notes significant life stressors. She denies chest pain. She is working weight loss to help control her blood pressure with the goal of decreasing her risk of heart attack and stroke.  Diabetes II Jasmine [name] has a diagnosis of diabetes type II. Jasmine [name] reports BGs readings between 130 and 150. She denies any hypoglycemic episodes. Last A1c was 7.7 on 10/07/17. She has been working on intensive lifestyle modifications including diet, exercise, and weight loss to help control her blood glucose levels.  At risk for cardiovascular disease Jasmine [name] is at a higher than average risk for cardiovascular disease due to obesity, hypertension, and diabetes II. She currently denies any chest pain.  ALLERGIES: Allergies  Allergen Reactions  . Eggs Or Egg-Derived Products Hives and Other (See Comments)    FLU VACCINE: caused hives    . Influenza Vaccines Hives    Vaccine from Egg derived product causes Hives  . Effexor [Venlafaxine]     "felt weird"  . Metformin And Related Swelling    ANGIOEDEMA    MEDICATIONS: Current Outpatient Medications on File Prior to Visit  Medication Sig Dispense Refill  . buPROPion (WELLBUTRIN XL) 300 MG 24 hr  tablet Take 1 tablet (300 mg total) by mouth daily. 90 tablet 0  . dapagliflozin propanediol (FARXIGA) 10 MG TABS tablet Take 10 mg by mouth daily. 90 tablet 1  . Dulaglutide (TRULICITY) 1.5 MG/0.5ML SOPN Inject 1.5 mg into the skin once a week. 12 pen 1  . gabapentin (NEURONTIN) 100 MG capsule Take 1 capsule (100 mg total) by mouth 2 (two) times daily. Start with one at evening and in 2 days start twice a day 60 capsule 1  . insulin degludec (TRESIBA FLEXTOUCH) 100 UNIT/ML SOPN FlexTouch Pen Inject 0.35 mLs (35 Units total) into the skin at bedtime. Inject 35 units every pm. 5 pen 0  . sertraline (ZOLOFT) 100 MG tablet Take one and one/half tablet daily. 135 tablet 0  . simvastatin (ZOCOR) 10 MG tablet TAKE 1 TABLET BY MOUTH EVERY DAY 30 tablet 1  . TROKENDI XR 50 MG CP24 TAKE 1 CAPSULE BY MOUTH EVERY DAY 90 capsule 0  . Vitamin D, Ergocalciferol, (DRISDOL) 50000 units CAPS capsule Take 1 capsule (50,000 Units total) by mouth every 7 (seven) days. 4 capsule 0  . [DISCONTINUED] DULoxetine (CYMBALTA) 20 MG capsule Take 1 capsule (20 mg total) by mouth daily. 90 capsule 1   No current facility-administered medications on file prior to visit.     PAST MEDICAL HISTORY: Past Medical History:  Diagnosis Date  . Anxiety   . Arthritis    "knees" (03/28/2014)  . Chronic renal insufficiency    stage  II (mild)  . Depression   . Headache    with high blood sugar  . Hypercholesteremia   . Hypertension   . Numbness and tingling of right arm and leg   . Seasonal allergies   . Type II diabetes mellitus (HCC)   . Umbilical hernia without mention of obstruction or gangrene   . Wears glasses     PAST SURGICAL HISTORY: Past Surgical History:  Procedure Laterality Date  . INSERTION OF MESH N/A 03/28/2014   Procedure: INSERTION OF MESH;  Surgeon: Axel Filler, MD;  Location: [facility] OR;  Service: General;  Laterality: N/A;  . INSERTION OF MESH N/A 05/08/2015   Procedure: INSERTION OF MESH;  Surgeon:  Axel Filler, MD;  Location: [facility] OR;  Service: General;  Laterality: N/A;  . LAPAROSCOPIC LYSIS OF ADHESIONS N/A 05/08/2015   Procedure: LAPAROSCOPIC LYSIS OF ADHESIONS;  Surgeon: Axel Filler, MD;  Location: [facility] OR;  Service: General;  Laterality: N/A;  . MULTIPLE TOOTH EXTRACTIONS  ~ 2003  . TONSILLECTOMY    . UMBILICAL HERNIA REPAIR  1973  . VENTRAL HERNIA REPAIR N/A 03/28/2014   Procedure: LAPAROSCOPIC VENTRAL HERNIA;  Surgeon: Axel Filler, MD;  Location: [facility] OR;  Service: General;  Laterality: N/A;  . VENTRAL HERNIA REPAIR N/A 05/08/2015   Procedure: LAPAROSCOPIC VENTRAL HERNIA REPAIR WITH MESH;  Surgeon: Axel Filler, MD;  Location: [facility] OR;  Service: General;  Laterality: N/A;    SOCIAL HISTORY: Social History   Tobacco Use  . Smoking status: Former Smoker    Packs/day: 0.25    Years: 26.00    Pack years: 6.50    Types: Cigarettes  . Smokeless tobacco: Never Used  Substance Use Topics  . Alcohol use: Yes    Comment: "have a drink a few times/year"  . Drug use: No    FAMILY HISTORY: Family History  Problem Relation Age of Onset  . Diabetes Mother   . High Cholesterol Mother   . Obesity Mother   . Diabetes Father   . Cancer Father        Prostate cancer  . Cancer Sister        Cervical cancer  . Diabetes Brother   . Bipolar disorder Brother   . Alcohol abuse Brother   . Drug abuse Brother   . Diabetes Sister   . Diabetes Brother     ROS: Review of Systems  Constitutional: Positive for weight loss.  Cardiovascular: Negative for chest pain.  Endo/Heme/Allergies:       Negative hypoglycemia    PHYSICAL EXAM: Blood pressure (!) 141/84, pulse (!) 105, temperature 98.4 F (36.9 C), temperature source Oral, height 5\' 2"  (1.575 m), weight 262 lb (118.8 kg), SpO2 98 %. Body mass index is 47.92 kg/m. Physical Exam  Constitutional: She is oriented to person, place, and time. She appears well-developed and well-nourished.  Cardiovascular: Normal rate.    Pulmonary/Chest: Effort normal.  Musculoskeletal: Normal range of motion.  Neurological: She is oriented to person, place, and time.  Skin: Skin is warm and dry.  Psychiatric: She has a normal mood and affect. Her behavior is normal.  Vitals reviewed.   RECENT LABS AND TESTS: BMET    Component Value Date/Time   NA 141 10/07/2017 1146   K 5.0 10/07/2017 1146   CL 105 10/07/2017 1146   CO2 23 10/07/2017 1146   GLUCOSE 124 (H) 10/07/2017 1146   GLUCOSE 96 09/09/2017 1430   BUN 12 10/07/2017 1146   CREATININE 1.28 (H) 10/07/2017  1146   CREATININE 1.50 (H) 09/09/2017 1430   CALCIUM 10.0 10/07/2017 1146   GFRNONAA 49 (L) 10/07/2017 1146   GFRNONAA 40 (L) 09/09/2017 1430   GFRAA 56 (L) 10/07/2017 1146   GFRAA 47 (L) 09/09/2017 1430   Lab Results  Component Value Date   HGBA1C 7.7 (H) 10/07/2017   HGBA1C 8.4 07/12/2017   HGBA1C 7.1 04/07/2017   HGBA1C 7.8 01/22/2017   HGBA1C 11.4 10/20/2016   Lab Results  Component Value Date   INSULIN 24.5 10/07/2017   CBC    Component Value Date/Time   WBC 5.0 10/07/2017 1146   WBC 4.7 11/26/2016 1134   RBC 5.46 (H) 10/07/2017 1146   RBC 5.60 (H) 11/26/2016 1134   HGB 12.8 10/07/2017 1146   HCT 39.3 10/27/2017 1128   PLT 288 11/26/2016 1134   MCV 72 (L) 10/07/2017 1146   MCH 23.4 (L) 10/07/2017 1146   MCH 23.2 (L) 11/26/2016 1134   MCHC 32.4 10/07/2017 1146   MCHC 31.5 (L) 11/26/2016 1134   RDW 16.5 (H) 10/07/2017 1146   LYMPHSABS 2.8 10/07/2017 1146   MONOABS 300 08/19/2016 1401   EOSABS 0.2 10/07/2017 1146   BASOSABS 0.1 10/07/2017 1146   Iron/TIBC/Ferritin/ %Sat    Component Value Date/Time   IRON 76 10/27/2017 1128   TIBC 375 10/27/2017 1128   FERRITIN 48 10/27/2017 1128   IRONPCTSAT 20 10/27/2017 1128   Lipid Panel     Component Value Date/Time   CHOL 180 10/07/2017 1146   TRIG 183 (H) 10/07/2017 1146   HDL 45 10/07/2017 1146   CHOLHDL 4 09/09/2017 1430   VLDL 41.4 (H) 09/09/2017 1430   LDLCALC 98  10/07/2017 1146   LDLDIRECT 103.0 09/09/2017 1430   Hepatic Function Panel     Component Value Date/Time   PROT 7.2 10/07/2017 1146   ALBUMIN 4.5 10/07/2017 1146   AST 11 10/07/2017 1146   ALT 13 10/07/2017 1146   ALKPHOS 73 10/07/2017 1146   BILITOT <0.2 10/07/2017 1146      Component Value Date/Time   TSH 1.230 10/07/2017 1146   TSH 1.36 11/26/2016 1134   TSH 0.860 02/08/2015 0945    ASSESSMENT AND PLAN: Essential hypertension - Plan: losartan-hydrochlorothiazide (HYZAAR) 50-12.5 MG tablet  Type 2 diabetes mellitus without complication, with long-term current use of insulin (HCC)  At risk for heart disease  Class 3 severe obesity with serious comorbidity and body mass index (BMI) of 45.0 to 49.9 in adult, unspecified obesity type (HCC)  PLAN:  Hypertension We discussed sodium restriction, working on healthy weight loss, and a regular exercise program as the means to achieve improved blood pressure control. Ahtziry agreed with this plan and agreed to follow up as directed. We will continue to monitor her blood pressure as well as her progress with the above lifestyle modifications. Jasmine [name] agrees to continue taking Hyzaar 50-12.5 mg PO daily #30 and we will refill for 1 month. She will watch for signs of hypotension as she continues her lifestyle modifications. Jasmine [name] agrees to follow up with our clinic as needed.  Diabetes II Jasmine [name] has been given extensive diabetes education by myself today including ideal fasting and post-prandial blood glucose readings, individual ideal Hgb A1c goals and hypoglycemia prevention. We discussed the importance of good blood sugar control to decrease the likelihood of diabetic complications such as nephropathy, neuropathy, limb loss, blindness, coronary artery disease, and death. We discussed the importance of intensive lifestyle modification including diet, exercise and weight loss as the  first line treatment for diabetes. Jasmine [name] agrees to  continue [place] 32 units and continue Trulicity and she agrees to follow up with our clinic as needed.  Cardiovascular risk counselling Jasmine [name] was given extended (15 minutes) coronary artery disease prevention counseling today. She is 52 y.o. female and has risk factors for heart disease including obesity, hypertension, and diabetes II. We discussed intensive lifestyle modifications today with an emphasis on specific weight loss instructions and strategies. Pt was also informed of the importance of increasing exercise and decreasing saturated fats to help prevent heart disease.  Obesity Jasmine [name] is currently in the action stage of change. As such, her goal is to continue with weight loss efforts She has agreed to follow the Category 2 plan + 100 calories Jasmine [name] has been instructed to work up to a goal of 150 minutes of combined cardio and strengthening exercise per week for weight loss and overall health benefits. We discussed the following Behavioral Modification Strategies today: increasing lean protein intake, increasing vegetables, work on meal planning and easy cooking plans, keeping healthy foods in the home, and planning for success   Jasmine [name] has agreed to follow up with our clinic as needed. She was informed of the importance of frequent follow up visits to maximize her success with intensive lifestyle modifications for her multiple health conditions.   OBESITY BEHAVIORAL INTERVENTION VISIT  Today's visit was # 5 out of 22.  Starting weight: 263 lbs Starting date: 10/07/17 Today's weight : 262 lbs  Today's date: 01/06/2018 Total lbs lost to date: 1 (Patients must lose 7 lbs in the first 6 months to continue with counseling)   ASK: We discussed the diagnosis of obesity with Stormy CardYolanda E Lacour today and Jasmine [name] agreed to give us permission to discuss obesity behavioral modification therapy today.  ASSESS: Jasmine [name] has the diagnosis of obesity and her BMI today is 47.91 Jasmine [name] is  in the action stage of change   ADVISE: Jasmine [name] was educated on the multiple health risks of obesity as well as the benefit of weight loss to improve her health. She was advised of the need for long term treatment and the importance of lifestyle modifications.  AGREE: Multiple dietary modification options and treatment options were discussed and  Rosealynn agreed to the above obesity treatment plan.  I, Burt KnackSharon Martin, am acting as transcriptionist for Debbra RidingAlexandria Kadolph, MD  I have reviewed the above documentation for accuracy and completeness, and I agree with the above. - Debbra RidingAlexandria Kadolph, MD

## 2018-01-11 ENCOUNTER — Ambulatory Visit (INDEPENDENT_AMBULATORY_CARE_PROVIDER_SITE_OTHER): Payer: PRIVATE HEALTH INSURANCE | Admitting: Licensed Clinical Social Worker

## 2018-01-11 DIAGNOSIS — F332 Major depressive disorder, recurrent severe without psychotic features: Secondary | ICD-10-CM | POA: Diagnosis not present

## 2018-01-11 DIAGNOSIS — F411 Generalized anxiety disorder: Secondary | ICD-10-CM

## 2018-01-11 DIAGNOSIS — F4310 Post-traumatic stress disorder, unspecified: Secondary | ICD-10-CM | POA: Diagnosis not present
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
## 2018-01-12 ENCOUNTER — Ambulatory Visit (INDEPENDENT_AMBULATORY_CARE_PROVIDER_SITE_OTHER): Payer: 59 | Admitting: Family Medicine

## 2018-01-17 ENCOUNTER — Other Ambulatory Visit: Payer: Self-pay

## 2018-01-17 ENCOUNTER — Encounter (HOSPITAL_COMMUNITY): Payer: Self-pay | Admitting: Psychiatry

## 2018-01-17 ENCOUNTER — Ambulatory Visit (INDEPENDENT_AMBULATORY_CARE_PROVIDER_SITE_OTHER): Payer: PRIVATE HEALTH INSURANCE | Admitting: Psychiatry

## 2018-01-17 VITALS — BP 112/80 | HR 96 | Ht 62.0 in | Wt 263.0 lb

## 2018-01-17 DIAGNOSIS — F331 Major depressive disorder, recurrent, moderate: Secondary | ICD-10-CM

## 2018-01-17 DIAGNOSIS — F411 Generalized anxiety disorder: Secondary | ICD-10-CM

## 2018-01-17 DIAGNOSIS — F332 Major depressive disorder, recurrent severe without psychotic features: Secondary | ICD-10-CM | POA: Diagnosis not present

## 2018-01-17 DIAGNOSIS — F4310 Post-traumatic stress disorder, unspecified: Secondary | ICD-10-CM

## 2018-01-17 MED ORDER — GABAPENTIN 100 MG PO CAPS: 100.0000 mg | ORAL_CAPSULE | Freq: Two times a day (BID) | ORAL | 1 refills | Status: DC

## 2018-01-17 NOTE — Progress Notes (Signed)
[facility] Outpatient Follow up visit   Patient Identification: Jasmine [name] MRN:  [id number] Date of Evaluation:  01/17/2018 Referral Source: Her therapist Jasmine [name]  Chief Complaint:   Chief Complaint    Follow-up; Other    I am not doing good.  Visit Diagnosis:    ICD-10-CM   1. Severe episode of recurrent major depressive disorder, without psychotic features (HCC) F33.2   2. Generalized anxiety disorder F41.1   3. PTSD (post-traumatic stress disorder) F43.10   4. Major depressive disorder, recurrent episode, moderate (HCC) F33.1     History of Present Illness: Jasmine [name] is 52 year old African-American married, She has been part of IOP in past and continues therapy with Jasmine [name]  Last visit was doing fair but apparently husband got diagnosed with alveolar rhabdomyosarcoma. Was hospitalized, growth around eye and surgery . She has had to take care of him and his personality has been changed as well. Getting more mean She has gone thru ordeal of taking care of him since April had to take days off ran out of vacation days and when joined around June 3 was not able to focus and function. Now applying for short term for which whe is coming today. Remains upset, anxious, crying spells and distracted due to intense stress at home and her husband sickness Tolerating gaba , zoloft and wellbutrin. Feels meds are doing what they can but care giving and anxiety related to his furture and health is effecting her to function  Works at [employer] and has gone thru some harrassment concerns in the past   Gets anxious and panicky        Aggravating factor: husband sickness, job stress  Modifying factor: limited friends  Past Psychiatric History: Patient denies any history of psychiatric inpatient treatment, suicidal attempt, self abusive behavior, traumatic brain injury or any history of abuse.  Previous Psychotropic Medications: Yes   Substance Abuse History in the last 12 months:   No.  Consequences of Substance Abuse: Negative  Past Medical History:  Past Medical History:  Diagnosis Date  . Anxiety   . Arthritis    "knees" (03/28/2014)  . Chronic renal insufficiency    stage II (mild)  . Depression   . Headache    with high blood sugar  . Hypercholesteremia   . Hypertension   . Numbness and tingling of right arm and leg   . Seasonal allergies   . Type II diabetes mellitus (HCC)   . Umbilical hernia without mention of obstruction or gangrene   . Wears glasses     Past Surgical History:  Procedure Laterality Date  . INSERTION OF MESH N/A 03/28/2014   Procedure: INSERTION OF MESH;  Surgeon: Axel Filler, MD;  Location: [facility] OR;  Service: General;  Laterality: N/A;  . INSERTION OF MESH N/A 05/08/2015   Procedure: INSERTION OF MESH;  Surgeon: Axel Filler, MD;  Location: [facility] OR;  Service: General;  Laterality: N/A;  . LAPAROSCOPIC LYSIS OF ADHESIONS N/A 05/08/2015   Procedure: LAPAROSCOPIC LYSIS OF ADHESIONS;  Surgeon: Axel Filler, MD;  Location: [facility] OR;  Service: General;  Laterality: N/A;  . MULTIPLE TOOTH EXTRACTIONS  ~ 2003  . TONSILLECTOMY    . UMBILICAL HERNIA REPAIR  1973  . VENTRAL HERNIA REPAIR N/A 03/28/2014   Procedure: LAPAROSCOPIC VENTRAL HERNIA;  Surgeon: Axel Filler, MD;  Location: [facility] OR;  Service: General;  Laterality: N/A;  . VENTRAL HERNIA REPAIR N/A 05/08/2015   Procedure: LAPAROSCOPIC VENTRAL HERNIA REPAIR WITH MESH;  Surgeon: Axel Filler,  MD;  Location: [facility] OR;  Service: General;  Laterality: N/A;    Family Psychiatric History: Viewed  Family History:  Family History  Problem Relation Age of Onset  . Diabetes Mother   . High Cholesterol Mother   . Obesity Mother   . Diabetes Father   . Cancer Father        Prostate cancer  . Cancer Sister        Cervical cancer  . Diabetes Brother   . Bipolar disorder Brother   . Alcohol abuse Brother   . Drug abuse Brother   . Diabetes Sister   . Diabetes Brother     Social  History:   Social History   Socioeconomic History  . Marital status: Married    Spouse name: Jasmine [name]  . Number of children: 1  . Years of education: Not on file  . Highest education level: Not on file  Occupational History  . Not on file  Social Needs  . Financial resource strain: Not on file  . Food insecurity:    Worry: Not on file    Inability: Not on file  . Transportation needs:    Medical: Not on file    Non-medical: Not on file  Tobacco Use  . Smoking status: Former Smoker    Packs/day: 0.25    Years: 26.00    Pack years: 6.50    Types: Cigarettes  . Smokeless tobacco: Never Used  Substance and Sexual Activity  . Alcohol use: Yes    Comment: "have a drink a few times/year"  . Drug use: No  . Sexual activity: Not Currently  Lifestyle  . Physical activity:    Days per week: Not on file    Minutes per session: Not on file  . Stress: Not on file  Relationships  . Social connections:    Talks on phone: Not on file    Gets together: Not on file    Attends religious service: Not on file    Active member of club or organization: Not on file    Attends meetings of clubs or organizations: Not on file    Relationship status: Not on file  Other Topics Concern  . Not on file  Social History Narrative  . Not on file     Allergies:   Allergies  Allergen Reactions  . Eggs Or Egg-Derived Products Hives and Other (See Comments)    FLU VACCINE: caused hives    . Influenza Vaccines Hives    Vaccine from Egg derived product causes Hives  . Effexor [Venlafaxine]     "felt weird"  . Metformin And Related Swelling    ANGIOEDEMA    Metabolic Disorder Labs: Lab Results  Component Value Date   HGBA1C 7.7 (H) 10/07/2017   MPG 384 (H) 01/09/2015   MPG 160 (H) 03/28/2014   No results found for: PROLACTIN Lab Results  Component Value Date   CHOL 180 10/07/2017   TRIG 183 (H) 10/07/2017   HDL 45 10/07/2017   CHOLHDL 4 09/09/2017   VLDL 41.4 (H) 09/09/2017    LDLCALC 98 10/07/2017   LDLCALC 116 (H) 06/16/2016     Current Medications: Current Outpatient Medications  Medication Sig Dispense Refill  . buPROPion (WELLBUTRIN XL) 300 MG 24 hr tablet TAKE 1 TABLET BY MOUTH EVERY DAY 30 tablet 0  . dapagliflozin propanediol (FARXIGA) 10 MG TABS tablet Take 10 mg by mouth daily. 90 tablet 1  . Dulaglutide (TRULICITY) 1.5 MG/0.5ML SOPN Inject  1.5 mg into the skin once a week. 12 pen 1  . gabapentin (NEURONTIN) 100 MG capsule Take 1 capsule (100 mg total) by mouth 2 (two) times daily. 60 capsule 1  . insulin degludec (TRESIBA FLEXTOUCH) 100 UNIT/ML SOPN FlexTouch Pen Inject 0.35 mLs (35 Units total) into the skin at bedtime. Inject 35 units every pm. 5 pen 0  . losartan-hydrochlorothiazide (HYZAAR) 50-12.5 MG tablet Take 1 tablet by mouth daily. Must make appointment before any further refills for Blood Pressure 30 tablet 1  . sertraline (ZOLOFT) 100 MG tablet Take one and one/half tablet daily. 135 tablet 0  . simvastatin (ZOCOR) 10 MG tablet TAKE 1 TABLET BY MOUTH EVERY DAY 30 tablet 1  . TROKENDI XR 50 MG CP24 TAKE 1 CAPSULE BY MOUTH EVERY DAY 90 capsule 0  . Vitamin D, Ergocalciferol, (DRISDOL) 50000 units CAPS capsule Take 1 capsule (50,000 Units total) by mouth every 7 (seven) days. 4 capsule 0   No current facility-administered medications for this visit.       Psychiatric Specialty Exam: Review of Systems  Cardiovascular: Negative for chest pain.  Skin: Negative for rash.  Psychiatric/Behavioral: Positive for depression. The patient is nervous/anxious.     Blood pressure 112/80, pulse 96, height 5\' 2"  (1.575 m), weight 263 lb (119.3 kg).Body mass index is 48.1 kg/m.  General Appearance: Casual and Obese  Eye Contact:  Fair  Speech:  Slow  Volume:  Decreased  Mood: dhsphoric  Affect: constricted and tearful  Thought Process:  Goal Directed  Orientation:  Full (Time, Place, and Person)  Thought Content:  Rumination  Suicidal  Thoughts:  No  Homicidal Thoughts:  No  Memory:  Immediate;   Fair Recent;   Fair Remote;   Fair  Judgement:  Good  Insight:  Good  Psychomotor Activity:  Decreased  Concentration:  Concentration: Fair and Attention Span: Fair  Recall:  Good  Fund of Knowledge:Good  Language: Good  Akathisia:  No  Handed:  Right  AIMS (if indicated):  0  Assets:  Communication Skills Desire for Improvement Housing  ADL's:  Intact  Cognition: WNL  Sleep:  fair    Treatment Plan Summary: Major depressive disorder, recurrent severe: meds were helping but husband sickness added more to depression. Patient to continue frequent therapy sesssions. Will keep meds doses the same for now as it is related to recent stress. Not suicidal.    Generalized anxiety disorder. Ongoing. Continue zoloft and gaba. Will send refill which are due Provided supportive therapy and to keep off work as she is not able to function and  Has to provider helps and care to husband Panic attacks : sporadic but more so related to recent husband sickeness. Continue zolof Fu 3 weeks or earlier if needed Completed paper work to remain off for around 6 weeks  Continue therapy  Thresa Ross, MD 6/10/201911:03 AM

## 2018-01-21 ENCOUNTER — Other Ambulatory Visit: Payer: Self-pay | Admitting: Physician Assistant

## 2018-01-21 MED ORDER — SIMVASTATIN 10 MG PO TABS: 10.0000 mg | ORAL_TABLET | Freq: Every day | ORAL | 0 refills | Status: DC

## 2018-01-24 ENCOUNTER — Ambulatory Visit (INDEPENDENT_AMBULATORY_CARE_PROVIDER_SITE_OTHER): Payer: PRIVATE HEALTH INSURANCE | Admitting: Licensed Clinical Social Worker

## 2018-01-24 DIAGNOSIS — F4310 Post-traumatic stress disorder, unspecified: Secondary | ICD-10-CM | POA: Diagnosis not present

## 2018-01-24 DIAGNOSIS — F331 Major depressive disorder, recurrent, moderate: Secondary | ICD-10-CM | POA: Diagnosis not present

## 2018-01-24 DIAGNOSIS — F411 Generalized anxiety disorder: Secondary | ICD-10-CM

## 2018-01-24 NOTE — Progress Notes (Signed)
   THERAPIST PROGRESS NOTE  Session Time: 10:07am-10:58am  Participation Level: Active  Behavioral Response:  Dress was casual  She was alert and focus was adequate  Euthymic   Type of Therapy: Individual therapy   Treatment Goals addressed: Reduce symptoms of depression and anxiety to improve daily functioning  Interventions: Treatment plan review/update, promoting self-care  Suicidal/Homicidal: Denied both  Therapist Interventions: Reviewed treatment plan.  Decided to maintain the same treatment goals as her symptoms of anxiety and depression remain significant as she faces stressors associated with caring for her husband who has cancer.  Assessed how she has been coping with that lately.   Encouraged her to think about caring for her husband the same way she would about [name] job.  When you are on the job you get [name] break after working [name] certain number of hours.  Recommended she plan to take [name] 15 minute break approximately 4 times in [name] 24 hour period.  Encouraged her to explain this plan to her husband and set the expectation that during her break she will not be available to him.  Also encouraged patient to think about how caring for herself would help her to better be able to care for her husband.       Summary:  Patient reported she is taking things one moment at [name] time.  Her husband continues to be rather demanding of her.  There are moments when he is mean and other moments when he expresses appreciation for everything she has been doing for him.  Since last seen she has gotten some financial support from family.  Looking forward to [name] friend visiting this coming weekend.  Admitted she has not always been consistent about taking her medication.  Attributed this in part to being so focused on her husband's needs.  Indicated she is receptive to therapist's suggestions to schedule time for breaks.        Plan:   Scheduled to return in approximately 2 weeks. Next treatment plan review  is due 04/27/18.  Diagnosis:  MDD, recurrent, moderate                      GAD                      PTSD    Jasmine [name], Jasmine Hascall A, LCSW 01/24/2018

## 2018-01-27 ENCOUNTER — Other Ambulatory Visit (HOSPITAL_COMMUNITY): Payer: Self-pay

## 2018-01-27 MED ORDER — GABAPENTIN 100 MG PO CAPS: 100.0000 mg | ORAL_CAPSULE | Freq: Two times a day (BID) | ORAL | 0 refills | Status: DC

## 2018-01-27 NOTE — Progress Notes (Signed)
Insurance requires 90 day supply of Gabapentin. Changed rx to 90 day supply.

## 2018-02-07 ENCOUNTER — Ambulatory Visit (INDEPENDENT_AMBULATORY_CARE_PROVIDER_SITE_OTHER): Payer: PRIVATE HEALTH INSURANCE | Admitting: Psychiatry

## 2018-02-07 ENCOUNTER — Other Ambulatory Visit: Payer: Self-pay

## 2018-02-07 ENCOUNTER — Encounter (HOSPITAL_COMMUNITY): Payer: Self-pay | Admitting: Psychiatry

## 2018-02-07 VITALS — Ht 62.0 in

## 2018-02-07 DIAGNOSIS — F411 Generalized anxiety disorder: Secondary | ICD-10-CM | POA: Diagnosis not present

## 2018-02-07 DIAGNOSIS — F332 Major depressive disorder, recurrent severe without psychotic features: Secondary | ICD-10-CM | POA: Diagnosis not present

## 2018-02-07 DIAGNOSIS — F331 Major depressive disorder, recurrent, moderate: Secondary | ICD-10-CM

## 2018-02-07 DIAGNOSIS — F4310 Post-traumatic stress disorder, unspecified: Secondary | ICD-10-CM | POA: Diagnosis not present

## 2018-02-07 MED ORDER — BUPROPION HCL ER (XL) 300 MG PO TB24: 300.0000 mg | ORAL_TABLET | Freq: Every day | ORAL | 0 refills | Status: DC

## 2018-02-07 NOTE — Progress Notes (Signed)
[facility] Outpatient Follow up visit   Patient Identification: Jasmine [name] MRN:  [id number] Date of Evaluation:  02/07/2018 Referral Source: Her therapist Jasmine [name]  Chief Complaint:   Chief Complaint    Follow-up; Other    I am not doing good.  Visit Diagnosis:    ICD-10-CM   1. Major depressive disorder, recurrent episode, moderate (HCC) F33.1   2. Generalized anxiety disorder F41.1   3. PTSD (post-traumatic stress disorder) F43.10   4. Severe episode of recurrent major depressive disorder, without psychotic features (HCC) F33.2     History of Present Illness: [name] is 52 year old African-American married, She has been part of IOP in past and continues therapy with Jasmine [name]  Husband readmitted in critical condition to [facility] has diagnosed with rhabodmyosarcoma was on radiation therapy but apparently got critical and had to be rushed to hospital Patient depressed, taking care of him, care giving and at present can not continue job due to this stress and feeling down  Tolerating meds, doing therapy  Works at [employer] and has gone thru some harrassment concerns in the past   Gets anxious and panicky     Aggravating factor: husband sickness,job stress and have to take days off Modifying factor: limited friends  Past Psychiatric History: Patient denies any history of psychiatric inpatient treatment, suicidal attempt, self abusive behavior, traumatic brain injury or any history of abuse.  Previous Psychotropic Medications: Yes   Substance Abuse History in the last 12 months:  No.  Consequences of Substance Abuse: Negative  Past Medical History:  Past Medical History:  Diagnosis Date  . Anxiety   . Arthritis    "knees" (03/28/2014)  . Chronic renal insufficiency    stage II (mild)  . Depression   . Headache    with high blood sugar  . Hypercholesteremia   . Hypertension   . Numbness and tingling of right arm and leg   . Seasonal allergies   . Type II diabetes mellitus  (HCC)   . Umbilical hernia without mention of obstruction or gangrene   . Wears glasses     Past Surgical History:  Procedure Laterality Date  . INSERTION OF MESH N/A 03/28/2014   Procedure: INSERTION OF MESH;  Surgeon: Axel Filler, MD;  Location: [facility] OR;  Service: General;  Laterality: N/A;  . INSERTION OF MESH N/A 05/08/2015   Procedure: INSERTION OF MESH;  Surgeon: Axel Filler, MD;  Location: [facility] OR;  Service: General;  Laterality: N/A;  . LAPAROSCOPIC LYSIS OF ADHESIONS N/A 05/08/2015   Procedure: LAPAROSCOPIC LYSIS OF ADHESIONS;  Surgeon: Axel Filler, MD;  Location: [facility] OR;  Service: General;  Laterality: N/A;  . MULTIPLE TOOTH EXTRACTIONS  ~ 2003  . TONSILLECTOMY    . UMBILICAL HERNIA REPAIR  1973  . VENTRAL HERNIA REPAIR N/A 03/28/2014   Procedure: LAPAROSCOPIC VENTRAL HERNIA;  Surgeon: Axel Filler, MD;  Location: [facility] OR;  Service: General;  Laterality: N/A;  . VENTRAL HERNIA REPAIR N/A 05/08/2015   Procedure: LAPAROSCOPIC VENTRAL HERNIA REPAIR WITH MESH;  Surgeon: Axel Filler, MD;  Location: [facility] OR;  Service: General;  Laterality: N/A;    Family Psychiatric History: Viewed  Family History:  Family History  Problem Relation Age of Onset  . Diabetes Mother   . High Cholesterol Mother   . Obesity Mother   . Diabetes Father   . Cancer Father        Prostate cancer  . Cancer Sister        Cervical cancer  .  Diabetes Brother   . Bipolar disorder Brother   . Alcohol abuse Brother   . Drug abuse Brother   . Diabetes Sister   . Diabetes Brother     Social History:   Social History   Socioeconomic History  . Marital status: Married    Spouse name: Jasmine [name]  . Number of children: 1  . Years of education: Not on file  . Highest education level: Not on file  Occupational History  . Not on file  Social Needs  . Financial resource strain: Not on file  . Food insecurity:    Worry: Not on file    Inability: Not on file  . Transportation needs:     Medical: Not on file    Non-medical: Not on file  Tobacco Use  . Smoking status: Former Smoker    Packs/day: 0.25    Years: 26.00    Pack years: 6.50    Types: Cigarettes  . Smokeless tobacco: Never Used  Substance and Sexual Activity  . Alcohol use: Yes    Comment: "have a drink a few times/year"  . Drug use: No  . Sexual activity: Not Currently  Lifestyle  . Physical activity:    Days per week: Not on file    Minutes per session: Not on file  . Stress: Not on file  Relationships  . Social connections:    Talks on phone: Not on file    Gets together: Not on file    Attends religious service: Not on file    Active member of club or organization: Not on file    Attends meetings of clubs or organizations: Not on file    Relationship status: Not on file  Other Topics Concern  . Not on file  Social History Narrative  . Not on file     Allergies:   Allergies  Allergen Reactions  . Eggs Or Egg-Derived Products Hives and Other (See Comments)    FLU VACCINE: caused hives    . Influenza Vaccines Hives    Vaccine from Egg derived product causes Hives  . Effexor [Venlafaxine]     "felt weird"  . Metformin And Related Swelling    ANGIOEDEMA    Metabolic Disorder Labs: Lab Results  Component Value Date   HGBA1C 7.7 (H) 10/07/2017   MPG 384 (H) 01/09/2015   MPG 160 (H) 03/28/2014   No results found for: PROLACTIN Lab Results  Component Value Date   CHOL 180 10/07/2017   TRIG 183 (H) 10/07/2017   HDL 45 10/07/2017   CHOLHDL 4 09/09/2017   VLDL 41.4 (H) 09/09/2017   LDLCALC 98 10/07/2017   LDLCALC 116 (H) 06/16/2016     Current Medications: Current Outpatient Medications  Medication Sig Dispense Refill  . buPROPion (WELLBUTRIN XL) 300 MG 24 hr tablet Take 1 tablet (300 mg total) by mouth daily. 90 tablet 0  . dapagliflozin propanediol (FARXIGA) 10 MG TABS tablet Take 10 mg by mouth daily. 90 tablet 1  . Dulaglutide (TRULICITY) 1.5 MG/0.5ML SOPN Inject 1.5 mg  into the skin once a week. 12 pen 1  . gabapentin (NEURONTIN) 100 MG capsule Take 1 capsule (100 mg total) by mouth 2 (two) times daily. 180 capsule 0  . insulin degludec (TRESIBA FLEXTOUCH) 100 UNIT/ML SOPN FlexTouch Pen Inject 0.35 mLs (35 Units total) into the skin at bedtime. Inject 35 units every pm. 5 pen 0  . losartan-hydrochlorothiazide (HYZAAR) 50-12.5 MG tablet Take 1 tablet by mouth daily. Must  make appointment before any further refills for Blood Pressure 30 tablet 1  . sertraline (ZOLOFT) 100 MG tablet Take one and one/half tablet daily. 135 tablet 0  . simvastatin (ZOCOR) 10 MG tablet Take 1 tablet (10 mg total) by mouth daily. 90 tablet 0  . TROKENDI XR 50 MG CP24 TAKE 1 CAPSULE BY MOUTH EVERY DAY 90 capsule 0  . Vitamin D, Ergocalciferol, (DRISDOL) 50000 units CAPS capsule Take 1 capsule (50,000 Units total) by mouth every 7 (seven) days. 4 capsule 0   No current facility-administered medications for this visit.       Psychiatric Specialty Exam: Review of Systems  Cardiovascular: Negative for chest pain.  Skin: Negative for rash.  Psychiatric/Behavioral: Positive for depression. The patient is nervous/anxious.     Height 5\' 2"  (1.575 m).Body mass index is 48.1 kg/m.  General Appearance: Casual and Obese  Eye Contact:  Fair  Speech:  Slow  Volume:  Decreased  Mood: dysphoric  Affect:constricted  Thought Process:  Goal Directed  Orientation:  Full (Time, Place, and Person)  Thought Content:  Rumination  Suicidal Thoughts:  No  Homicidal Thoughts:  No  Memory:  Immediate;   Fair Recent;   Fair Remote;   Fair  Judgement:  Good  Insight:  Good  Psychomotor Activity:  Decreased  Concentration:  Concentration: Fair and Attention Span: Fair  Recall:  Good  Fund of Knowledge:Good  Language: Good  Akathisia:  No  Handed:  Right  AIMS (if indicated):  0  Assets:  Communication Skills Desire for Improvement Housing  ADL's:  Intact  Cognition: WNL  Sleep:  fair     Treatment Plan Summary: Major depressive disorder, recurrent severe: husband readmitied, feeling dysphoric, undertandably due to stress. Continue meds and treatment with therapy Provided supportive therapy. At present need to remain off work till end of July due to current extreme stress Continue wellbutrin.   Generalized anxiety disorder. Ongoing. Continue gaba and zoloftProvided supportive therapy and to keep off work as she is not able to function and  Has to provider helps and care to husband Panic attacks : sporadic, trying to focus on husbands condition . Continue therapy. Continue zoloft Fu 4 w  Thresa Ross, MD 7/1/20199:12 AM

## 2018-02-07 NOTE — Patient Instructions (Signed)
Husband in critical condition admitted in [facility], Patient would need continued time off from work to deal with current stress and care giving till end of July 2019.

## 2018-02-14 ENCOUNTER — Ambulatory Visit (HOSPITAL_COMMUNITY): Payer: Self-pay | Admitting: Licensed Clinical Social Worker

## 2018-02-21 ENCOUNTER — Telehealth (HOSPITAL_COMMUNITY): Payer: Self-pay | Admitting: Licensed Clinical Social Worker

## 2018-02-21 NOTE — Telephone Encounter (Signed)
Dr. Peterson AoAlbers with [employer] disability review would like to speak with you in regards to this case.  Please call back at [phone]

## 2018-03-03 ENCOUNTER — Ambulatory Visit (INDEPENDENT_AMBULATORY_CARE_PROVIDER_SITE_OTHER): Payer: PRIVATE HEALTH INSURANCE | Admitting: Psychiatry

## 2018-03-03 DIAGNOSIS — F331 Major depressive disorder, recurrent, moderate: Secondary | ICD-10-CM

## 2018-03-07 ENCOUNTER — Ambulatory Visit: Payer: 59 | Admitting: Physician Assistant

## 2018-03-07 NOTE — Progress Notes (Signed)
   THERAPIST PROGRESS NOTE  Session Time: 3:10-4:00  Participation Level: Active  Behavioral Response: CasualAlertDepressed  Type of Therapy: Individual Therapy  Treatment Goals addressed: Coping  Interventions: CBT  Summary: Jasmine [name] is a 52 y.o. female who presents with MDD.   Suicidal/Homicidal: Nowithout intent/plan  Therapist Response: Pt. Presents for first session since moving from the [place] office. Pt. Discussed that in the last three months her husband has been diagnosed with rare for of  Nose and eye cancer and [facility]. Pt. Has taken significant time off to manage his care and transport him to and from treatments. As a result of treatments, her husband has undergone a significant personality change and become very angry and aggressive towards her. Therapist attempted to normalize this behavior in light of his diagnosis and the effects of treatment. Pt. Was encouraged to seek counseling and social support for her husband through the cancer center patient support services. Pt. Indicated tendency to minimize the stress caused by her husband to his oncologist. Pt. Was also encouraged to be as honest as possible with her husband's oncologist so that he can be aware of personality changes that might be contributing to his stress and be relevant to his cancer treatment. Pt. Discussed that since her husband's diagnosis that she has not been sleeping well and has been generally neglecting her self-care. Pt. Was strongly encouraged to begin focusing on the basics of sleep, nutrition, and movement so that she be healthy to continue providing care for her husband.  Plan: Return again in 2 weeks.  Diagnosis: MDD, recurrent, moderate    [name], Dyonna Jaspers B, [facility] 03/07/2018

## 2018-03-08 ENCOUNTER — Ambulatory Visit (INDEPENDENT_AMBULATORY_CARE_PROVIDER_SITE_OTHER): Payer: 59 | Admitting: Physician Assistant

## 2018-03-08 ENCOUNTER — Encounter: Payer: Self-pay | Admitting: Physician Assistant

## 2018-03-08 VITALS — BP 125/80 | HR 88 | Ht 62.0 in | Wt 266.0 lb

## 2018-03-08 DIAGNOSIS — E1169 Type 2 diabetes mellitus with other specified complication: Secondary | ICD-10-CM

## 2018-03-08 DIAGNOSIS — F430 Acute stress reaction: Secondary | ICD-10-CM

## 2018-03-08 DIAGNOSIS — E1122 Type 2 diabetes mellitus with diabetic chronic kidney disease: Secondary | ICD-10-CM

## 2018-03-08 DIAGNOSIS — E785 Hyperlipidemia, unspecified: Secondary | ICD-10-CM

## 2018-03-08 DIAGNOSIS — I10 Essential (primary) hypertension: Secondary | ICD-10-CM

## 2018-03-08 DIAGNOSIS — Z794 Long term (current) use of insulin: Secondary | ICD-10-CM | POA: Diagnosis not present

## 2018-03-08 DIAGNOSIS — R51 Headache: Secondary | ICD-10-CM

## 2018-03-08 DIAGNOSIS — R519 Headache, unspecified: Secondary | ICD-10-CM

## 2018-03-08 LAB — POCT GLYCOSYLATED HEMOGLOBIN (HGB A1C): Hemoglobin A1C: 7.6 % — AB (ref 4.0–5.6)

## 2018-03-08 MED ORDER — TOPIRAMATE ER 50 MG PO CAP24
1.0000 | ORAL_CAPSULE | Freq: Every day | ORAL | 1 refills | Status: DC
Start: 2018-03-08 — End: 2018-11-16

## 2018-03-08 MED ORDER — SIMVASTATIN 10 MG PO TABS: 10.0000 mg | ORAL_TABLET | Freq: Every day | ORAL | 4 refills | Status: DC

## 2018-03-08 MED ORDER — DAPAGLIFLOZIN PROPANEDIOL 10 MG PO TABS: 10.0000 mg | ORAL_TABLET | Freq: Every day | ORAL | 1 refills | Status: DC

## 2018-03-08 MED ORDER — OLMESARTAN MEDOXOMIL-HCTZ 20-12.5 MG PO TABS: 1.0000 | ORAL_TABLET | Freq: Every day | ORAL | 1 refills | Status: DC

## 2018-03-08 NOTE — Progress Notes (Signed)
Subjective:    Patient ID: Jasmine [name], female    DOB: 24-Aug-1965, 52 y.o.   MRN: [id number]  HPI  Pt is a 52 yo female with T2DM, HTN, HLD, MDD, anxiety who presents to the clinic for follow up.   DM- pt used to see endocrinology but stopped going and wanted me to follow up on. Denies any vision changes, open sores or wounds. Pt is checking sugars and most fasting are around 120's. She is taking all of her DM medications.   HTN- no CP, palpitations, headaches, or vision changes. She is taking hyzaar but having trouble getting it filled due to being on backorder. No CP, palpitations, headaches or vision changes.   Her mood is not controlled. She sees Dr. Clydene PughAdkar downstairs for medication management. She has been written out of work for 4 weeks. Her husband is really sick. He is at home now but really sick with cancer. He has lost a lot of weight. He does not want to eat and she feels like he will pass away soon. She does not have anymore PTO at work but still needs some days to help care for her husband.   She is having migraines/headaches almost every day. Does not have rescue. NSAIDs help with rescue.   .. Active Ambulatory Problems    Diagnosis Date Noted  . Depression 02/09/2010  . ALLERGIC RHINITIS 02/09/2010  . Morbid obesity with BMI of 50.0-59.9, adult (HCC) 06/03/2012  . Hyperlipidemia 11/27/2013  . Primary osteoarthritis of both knees 11/27/2013  . Chronic renal insufficiency, stage II (mild) 11/28/2013  . Umbilical hernia without obstruction and without gangrene 02/02/2014  . Ventral hernia, recurrent 03/28/2014  . Essential hypertension 01/09/2015  . Diabetic retinopathy (HCC) 03/12/2016  . Hyperlipidemia associated with type 2 diabetes mellitus (HCC) 06/17/2016  . Anxiety state 06/17/2016  . Tobacco use 08/13/2016  . Micturition syncope 08/13/2016  . Vitamin D deficiency 11/27/2016  . B12 deficiency 11/27/2016  . Frequent headaches 01/05/2017  . Menopause  05/13/2017  . Severe episode of recurrent major depressive disorder, without psychotic features (HCC) 05/19/2017  . Generalized anxiety disorder 05/19/2017  . Severe major depression, single episode, without psychotic features (HCC) 05/26/2017  . Type 2 diabetes mellitus with chronic kidney disease, with long-term current use of insulin (HCC) 07/12/2017  . Acute stress reaction 03/09/2018   Resolved Ambulatory Problems    Diagnosis Date Noted  . No Resolved Ambulatory Problems   Past Medical History:  Diagnosis Date  . Anxiety   . Arthritis   . Chronic renal insufficiency   . Depression   . Headache   . Hypercholesteremia   . Hypertension   . Numbness and tingling of right arm and leg   . Seasonal allergies   . Type II diabetes mellitus (HCC)   . Umbilical hernia without mention of obstruction or gangrene   . Wears glasses       Review of Systems See HPI.     Objective:   Physical Exam  Constitutional: She is oriented to person, place, and time. She appears well-developed and well-nourished.  Obese.   HENT:  Head: Normocephalic and atraumatic.  Cardiovascular: Normal rate and regular rhythm.  Neurological: She is alert and oriented to person, place, and time.          Assessment & Plan:  Marland Kitchen.Marland Kitchen.Diagnoses and all orders for this visit:  Acute stress reaction  Essential hypertension -     olmesartan-hydrochlorothiazide (BENICAR HCT) 20-12.5 MG tablet; Take 1  tablet by mouth daily.  Type 2 diabetes mellitus with chronic kidney disease, with long-term current use of insulin, unspecified CKD stage (HCC) -     dapagliflozin propanediol (FARXIGA) 10 MG TABS tablet; Take 10 mg by mouth daily. -     POCT glycosylated hemoglobin (Hb A1C)  Hyperlipidemia associated with type 2 diabetes mellitus (HCC) -     simvastatin (ZOCOR) 10 MG tablet; Take 1 tablet (10 mg total) by mouth daily.  Frequent headaches -     Topiramate ER (TROKENDI XR) 50 MG CP24; Take 1 tablet by mouth  daily.    Depression screen [facility] 2/9 03/08/2018 12/24/2017 10/25/2017 10/07/2017 07/27/2017  Decreased Interest 3 3 2 2 3   Down, Depressed, Hopeless 3 3 1 2 3   PHQ - 2 Score 6 6 3 4 6   Altered sleeping 3 3 3  0 3  Tired, decreased energy 3 3 3 1 2   Change in appetite 2 3 2 3 3   Feeling bad or failure about yourself  2 2 2 2 2   Trouble concentrating 3 3 3 3 3   Moving slowly or fidgety/restless 2 2 2 1 3   Suicidal thoughts 0 0 0 0 0  PHQ-9 Score 21 22 18 14 22   Difficult doing work/chores Somewhat difficult - - Somewhat difficult -  Some recent data might be hidden   .Marland Kitchen GAD 7 : Generalized Anxiety Score 03/08/2018 12/24/2017 10/25/2017 07/27/2017  Nervous, Anxious, on Edge 3 3 3 2   Control/stop worrying 3 3 2 3   Worry too much - different things 3 3 2 3   Trouble relaxing 3 3 3 2   Restless 3 2 1 2   Easily annoyed or irritable 3 2 3 3   Afraid - awful might happen 3 3 2 3   Total GAD 7 Score 21 19 16 18   Anxiety Difficulty Somewhat difficult Somewhat difficult Somewhat difficult Very difficult   For mood continue to follow up downstairs with Dr. Rogue Bussing.  .. Lab Results  Component Value Date   HGBA1C 7.6 (A) 03/08/2018   A!C still not controlled. Discussed diet changes.  Stay on same medication.  On STATIN.  On ARB. Switched to benicar today to see if easier to get.  Follow up in 3 months.   Discussed will fill out paperwork for patient to have FMLA for some days a month to take care of her husband. She will get paperwork to me.   Started topamax. Discussed side effects. Should help to prevent some migraines/headaches. Continue to use

## 2018-03-09 ENCOUNTER — Encounter: Payer: Self-pay | Admitting: Physician Assistant

## 2018-03-09 DIAGNOSIS — F430 Acute stress reaction: Secondary | ICD-10-CM | POA: Insufficient documentation

## 2018-03-21 ENCOUNTER — Ambulatory Visit (INDEPENDENT_AMBULATORY_CARE_PROVIDER_SITE_OTHER): Payer: PRIVATE HEALTH INSURANCE | Admitting: Psychiatry

## 2018-03-21 ENCOUNTER — Encounter (HOSPITAL_COMMUNITY): Payer: Self-pay | Admitting: Psychiatry

## 2018-03-21 ENCOUNTER — Other Ambulatory Visit: Payer: Self-pay

## 2018-03-21 VITALS — BP 120/82 | HR 90 | Ht 62.0 in | Wt 265.0 lb

## 2018-03-21 DIAGNOSIS — Z818 Family history of other mental and behavioral disorders: Secondary | ICD-10-CM | POA: Diagnosis not present

## 2018-03-21 DIAGNOSIS — F4310 Post-traumatic stress disorder, unspecified: Secondary | ICD-10-CM | POA: Diagnosis not present

## 2018-03-21 DIAGNOSIS — F331 Major depressive disorder, recurrent, moderate: Secondary | ICD-10-CM | POA: Diagnosis not present

## 2018-03-21 DIAGNOSIS — F411 Generalized anxiety disorder: Secondary | ICD-10-CM

## 2018-03-21 DIAGNOSIS — Z811 Family history of alcohol abuse and dependence: Secondary | ICD-10-CM

## 2018-03-21 NOTE — Progress Notes (Signed)
[facility] Outpatient Follow [facility] visit   Patient Identification: Jasmine [name] MRN:  [id number] Date of Evaluation:  03/21/2018 Referral Source: Her therapist Dominic PeaSarah Solomon  Chief Complaint:   Chief Complaint    Follow-up; Other    I am not doing good.  Visit Diagnosis:    ICD-10-CM   1. Major depressive disorder, recurrent episode, moderate (HCC) F33.1   2. Generalized anxiety disorder F41.1   3. PTSD (post-traumatic stress disorder) F43.10     History of Present Illness: Jasmine [name] is 52 year old African-American married, She has been part of IOP in past and continues therapy with Maralyn SagoSarah  Husband is in chemo  Now home but weak and she is caregiving which is a stress. She is back to work as well and doing therapy to deal with stress and coping   Tolerating meds, doing therapy Having difficult time coping with care giving and job , balancing time Feels meds are doing what it can Works at [employer] and has gone thru some harrassment concerns in the past   Gets anxious and panicky     Aggravating factor: husband sickness,job stress and have to take days off Modifying factor: llimited friends  Past Psychiatric History: Patient denies any history of psychiatric inpatient treatment, suicidal attempt, self abusive behavior, traumatic brain injury or any history of abuse.  Previous Psychotropic Medications: Yes   Substance Abuse History in the last 12 months:  No.  Consequences of Substance Abuse: Negative  Past Medical History:  Past Medical History:  Diagnosis Date  . Anxiety   . Arthritis    "knees" (03/28/2014)  . Chronic renal insufficiency    stage II (mild)  . Depression   . Headache    with high blood sugar  . Hypercholesteremia   . Hypertension   . Numbness and tingling of right arm and leg   . Seasonal allergies   . Type II diabetes mellitus (HCC)   . Umbilical hernia without mention of obstruction or gangrene   . Wears glasses     Past Surgical History:   Procedure Laterality Date  . INSERTION OF MESH N/A 03/28/2014   Procedure: INSERTION OF MESH;  Surgeon: Axel FillerArmando Ramirez, MD;  [facility];  Service: General;  Laterality: N/A;  . INSERTION OF MESH N/A 05/08/2015   Procedure: INSERTION OF MESH;  Surgeon: Axel FillerArmando Ramirez, MD;  Location: [facility] OR;  Service: General;  Laterality: N/A;  . LAPAROSCOPIC LYSIS OF ADHESIONS N/A 05/08/2015   Procedure: LAPAROSCOPIC LYSIS OF ADHESIONS;  Surgeon: Axel FillerArmando Ramirez, MD;  Location: [facility] OR;  Service: General;  Laterality: N/A;  . MULTIPLE TOOTH EXTRACTIONS  ~ 2003  . TONSILLECTOMY    . UMBILICAL HERNIA REPAIR  1973  . VENTRAL HERNIA REPAIR N/A 03/28/2014   Procedure: LAPAROSCOPIC VENTRAL HERNIA;  Surgeon: Axel FillerArmando Ramirez, MD;  Location: [facility] OR;  Service: General;  Laterality: N/A;  . VENTRAL HERNIA REPAIR N/A 05/08/2015   Procedure: LAPAROSCOPIC VENTRAL HERNIA REPAIR WITH MESH;  Surgeon: Axel FillerArmando Ramirez, MD;  Location: [facility] OR;  Service: General;  Laterality: N/A;    Family Psychiatric History: Viewed  Family History:  Family History  Problem Relation Age of Onset  . Diabetes Mother   . High Cholesterol Mother   . Obesity Mother   . Diabetes Father   . Cancer Father        Prostate cancer  . Cancer Sister        Cervical cancer  . Diabetes Brother   . Bipolar disorder Brother   .  Alcohol abuse Brother   . Drug abuse Brother   . Diabetes Sister   . Diabetes Brother     Social History:   Social History   Socioeconomic History  . Marital status: Married    Spouse name: Florencia ReasonsStephen Dawkins  . Number of children: 1  . Years of education: Not on file  . Highest education level: Not on file  Occupational History  . Not on file  Social Needs  . Financial resource strain: Not on file  . Food insecurity:    Worry: Not on file    Inability: Not on file  . Transportation needs:    Medical: Not on file    Non-medical: Not on file  Tobacco Use  . Smoking status: Former Smoker    Packs/day: 0.25     Years: 26.00    Pack years: 6.50    Types: Cigarettes  . Smokeless tobacco: Never Used  Substance and Sexual Activity  . Alcohol use: Yes    Comment: "have a drink a few times/year"  . Drug use: No  . Sexual activity: Not Currently  Lifestyle  . Physical activity:    Days per week: Not on file    Minutes per session: Not on file  . Stress: Not on file  Relationships  . Social connections:    Talks on phone: Not on file    Gets together: Not on file    Attends religious service: Not on file    Active member of club or organization: Not on file    Attends meetings of clubs or organizations: Not on file    Relationship status: Not on file  Other Topics Concern  . Not on file  Social History Narrative  . Not on file     Allergies:   Allergies  Allergen Reactions  . Eggs Or Egg-Derived Products Hives and Other (See Comments)    FLU VACCINE: caused hives    . Influenza Vaccines Hives    Vaccine from Egg derived product causes Hives  . Effexor [Venlafaxine]     "felt weird"  . Metformin And Related Swelling    ANGIOEDEMA    Metabolic Disorder Labs: Lab Results  Component Value Date   HGBA1C 7.6 (A) 03/08/2018   MPG 384 (H) 01/09/2015   MPG 160 (H) 03/28/2014   No results found for: PROLACTIN Lab Results  Component Value Date   CHOL 180 10/07/2017   TRIG 183 (H) 10/07/2017   HDL 45 10/07/2017   CHOLHDL 4 09/09/2017   VLDL 41.4 (H) 09/09/2017   LDLCALC 98 10/07/2017   LDLCALC 116 (H) 06/16/2016     Current Medications: Current Outpatient Medications  Medication Sig Dispense Refill  . buPROPion (WELLBUTRIN XL) 300 MG 24 hr tablet Take 1 tablet (300 mg total) by mouth daily. 90 tablet 0  . dapagliflozin propanediol (FARXIGA) 10 MG TABS tablet Take 10 mg by mouth daily. 90 tablet 1  . Dulaglutide (TRULICITY) 1.5 MG/0.5ML SOPN Inject 1.5 mg into the skin once a week. 12 pen 1  . gabapentin (NEURONTIN) 100 MG capsule Take 1 capsule (100 mg total) by mouth 2 (two)  times daily. 180 capsule 0  . insulin degludec (TRESIBA FLEXTOUCH) 100 UNIT/ML SOPN FlexTouch Pen Inject 0.35 mLs (35 Units total) into the skin at bedtime. Inject 35 units every pm. 5 pen 0  . olmesartan-hydrochlorothiazide (BENICAR HCT) 20-12.5 MG tablet Take 1 tablet by mouth daily. 90 tablet 1  . sertraline (ZOLOFT) 100 MG tablet Take  one and one/half tablet daily. 135 tablet 0  . simvastatin (ZOCOR) 10 MG tablet Take 1 tablet (10 mg total) by mouth daily. 90 tablet 4  . Topiramate ER (TROKENDI XR) 50 MG CP24 Take 1 tablet by mouth daily. 90 capsule 1  . Vitamin D, Ergocalciferol, (DRISDOL) 50000 units CAPS capsule Take 1 capsule (50,000 Units total) by mouth every 7 (seven) days. 4 capsule 0   No current facility-administered medications for this visit.       Psychiatric Specialty Exam: Review of Systems  Cardiovascular: Negative for palpitations.  Skin: Negative for rash.  Psychiatric/Behavioral: Positive for depression.    Blood pressure 120/82, pulse 90, height 5\' 2"  (1.575 m), weight 265 lb (120.2 kg).Body mass index is 48.47 kg/m.  General Appearance: Casual and Obese  Eye Contact:  Fair  Speech:  Slow  Volume:  Decreased  Mood: subdued  Affect:constricted  Thought Process:  Goal Directed  Orientation:  Full (Time, Place, and Person)  Thought Content:  Rumination  Suicidal Thoughts:  No  Homicidal Thoughts:  No  Memory:  Immediate;   Fair Recent;   Fair Remote;   Fair  Judgement:  Good  Insight:  Good  Psychomotor Activity:  Decreased  Concentration:  Concentration: Fair and Attention Span: Fair  Recall:  Good  Fund of Knowledge:Good  Language: Good  Akathisia:  No  Handed:  Right  AIMS (if indicated):  0  Assets:  Communication Skills Desire for Improvement Housing  ADL's:  Intact  Cognition: WNL  Sleep:  fair    Treatment Plan Summary: Major depressive disorder, recurrent severe: subdued. Continue meds. Not increasing dose . Continue more therapy  sessions to deal with stress Provided supportive therapy.  Continue wellbutrin.   Generalized anxiety disorder.fluctuates. Continue zoloft and work with coping skills Panic attacks : sporadic, trying to focus on husbands condition . Continue therapy. Continue zoloft Fu 4 w  Thresa Ross, MD 8/12/201910:27 AM

## 2018-03-22 ENCOUNTER — Ambulatory Visit (INDEPENDENT_AMBULATORY_CARE_PROVIDER_SITE_OTHER): Payer: PRIVATE HEALTH INSURANCE | Admitting: Psychiatry

## 2018-03-22 DIAGNOSIS — F322 Major depressive disorder, single episode, severe without psychotic features: Secondary | ICD-10-CM

## 2018-03-25 NOTE — Progress Notes (Signed)
   THERAPIST PROGRESS NOTE  Session Time: 3:10-4:00  Participation Level: Active  Behavioral Response: CasualAlertAnxious  Type of Therapy: Individual Therapy  Treatment Goals addressed: Coping  Interventions: CBT  Summary: [name] is a 52 y.o. female who presents with MDD.   Suicidal/Homicidal: Nowithout intent/plan  Therapist Response: Pt. Presents with mildly brightened mood, moderate anxiety. Pt. Discussed her most significant stressors, caregiving for her [facility] who [facility] and her work schedule, managing her self-care, and balancing finances.. Pt. Discussed that since the last session she and her husband made the decision to purchase another car to lessen the stress of trips [place] and [place] [facility]. Pt. Stated that she was very happy about this decision. Pt. Discussed stressful events with her new supervisor and efforts that she has to make to communicate with the supervisor and requests for representation from her union representative. Pt. Also discussed that she has moved forward with her job search and is very hopeful to hear about recent interview. Pt. Discussed that she is developing understanding her her husband's cancer and the physical, emotional, and mental toll that the chemotherapy drugs have taken. Pt. Discussed that she is reaching out for support as needed from the cancer center.  Plan: Return again in 2 weeks.  Diagnosis:      MDD, recurrent, moderate     [name], Jennifer B, [facility] 03/25/2018

## 2018-03-29 ENCOUNTER — Ambulatory Visit (INDEPENDENT_AMBULATORY_CARE_PROVIDER_SITE_OTHER): Payer: PRIVATE HEALTH INSURANCE | Admitting: Psychiatry

## 2018-03-29 DIAGNOSIS — F322 Major depressive disorder, single episode, severe without psychotic features: Secondary | ICD-10-CM

## 2018-03-29 DIAGNOSIS — F411 Generalized anxiety disorder: Secondary | ICD-10-CM

## 2018-03-31 NOTE — Progress Notes (Signed)
   THERAPIST PROGRESS NOTE   Session Time:3:10-4:00  Participation Level:Active  Behavioral Response:CasualAlertAnxious  Type of Therapy:Individual Therapy  Treatment Goals addressed:Coping  Interventions:CBT  Summary:Breeana E Norrisis a 52 y.o.femalewho presents with MDD.   Suicidal/Homicidal:Nowithout intent/plan  Therapist Response:Pt. Presents with moderate anxiety. Pt. Discussed that her anxiety was triggered by car purchase and having to return the car for an updated model. Pt. Stated "I cried for the first time over the car". Counselor processed that Pt.'s emotions regarding her husband's illness and work have been painful to express, but the car allowed her to release months of sadness. Pt. Continues to report that her anxiety is most triggered by caregiving for her husband. Significant time was spent in session focusing on availability of support system which is very limited at this time. Pt discussed that family members and church friends have been very slow to contact her regarding providing help. Pt. Has been reluctant to reach out for help because her husband is sensitive about people seeing his physical condition. Counselor recommended that Pt. Implement a schedule, for example letting her husband know that she will check on him at 6:00, 8:00 and 10:00 and that he can call her in between should he have an emergency. Counselor explained to Pt. That the schedule would provide some routine and certainty around her caregiving and give her some time to have some uninterrupted sleep and self-care. Pt. Discussed preparations for upcoming interview and hopefulness about job transition.    Plan: Return again in2weeks.  Diagnosis:MDD, recurrent, moderate  Shaune PollackJennifer B Brown, [facility] 03/31/2018

## 2018-04-05 ENCOUNTER — Ambulatory Visit (INDEPENDENT_AMBULATORY_CARE_PROVIDER_SITE_OTHER): Payer: PRIVATE HEALTH INSURANCE | Admitting: Psychiatry

## 2018-04-05 DIAGNOSIS — F322 Major depressive disorder, single episode, severe without psychotic features: Secondary | ICD-10-CM | POA: Diagnosis not present

## 2018-04-07 NOTE — Progress Notes (Signed)
   THERAPIST PROGRESS NOTE  Session Time:2:10-3:00  Participation Level:Active  Behavioral Response:CasualAlertAnxious  Type of Therapy:Individual Therapy  Treatment Goals addressed:Coping  Interventions:CBT  Summary:Jasmine E Norrisis a 52 y.o.femalewho presents with MDD.   Suicidal/Homicidal:Nowithout intent/plan  Therapist Response:Pt.continues to present with moderate anxiety. Pt. Discussed her fears that her husband may be dying from cancer and she does not believe that his doctor's are being honest with her regarding his outcomes. Pt. Discussed that he has become increasingly agitated, impatient, and angry with her. Pt. Was encouraged to discuss with her husband's doctors about whether he would be able to tolerate an anti-depressant during his treatment, as his mood may be affecting his recovery. Pt. Discussed that her stress is compounded by attempting to balance work demands with [place] [facility]. Counselor suggested to patient that she discuss local treatment options with her husband's oncologist as the travel has taken its toll physically and financially on Pt. And her husband. Pt. Discussed that she continues to manage difficult work situation, but had a successful interview this week that left her feeling hopeful that she will be able transition soon. Pt. Discussed that she is making an effort to prioritize her sleep, taking her medication, but that she continues to be challenged to find enough time to eat. Pt. Was challenged to make her self-care the priority so that she can continue to provide care and support for her husband.  Plan: Return again in2weeks.  Diagnosis:MDD, recurrent, moderate   Shaune PollackJennifer B Eyana Stolze, [facility] 04/07/2018

## 2018-04-13 ENCOUNTER — Ambulatory Visit (INDEPENDENT_AMBULATORY_CARE_PROVIDER_SITE_OTHER): Payer: PRIVATE HEALTH INSURANCE | Admitting: Psychiatry

## 2018-04-13 DIAGNOSIS — F322 Major depressive disorder, single episode, severe without psychotic features: Secondary | ICD-10-CM

## 2018-04-14 NOTE — Progress Notes (Signed)
   THERAPIST PROGRESS NOTE   Session Time:3:50-4:40  Participation Level:Active  Behavioral Response:CasualAlertAnxiousDepressed  Type of Therapy:Individual Therapy  Treatment Goals addressed:Coping  Interventions:CBT  Summary:Shellye E Norrisis a 52 y.o.femalewho presents with MDD.   Suicidal/Homicidal:Nowithout intent/plan  Therapist Response:Pt.presents with anxiety and depression. Pt. Called office for appointment due to feeling "out of control", crying, difficulty concentrating at work. Pt. Received news last week that her husband's oncologist is discontinuing his chemotherapy. Pt. Received a phone call from the oncologist who stated that he did not want to discuss her husband's condition over the phone and would wait until his next appointment on 9/6. The uncertainty of the conversation triggered Pt.'s anxiety. Pt. Is attempting to remain hopeful, but is fearful that her husband's cancer is terminal. Pt. Became appropriately tearful in session, discussed her fears about his death, and needing more support at work and at home so that she can spend as much time with him as possible. Pt. Discussed that her greatest fear is that she will come home from work and find her husband dead. Pt. Discussed that she has poor support from her daughter, mother, sister, brother, and church family. Counselor provided support and psychoeducation regarding anticipatory grief. Counselor discussed services provided by Hospice and suggestion to consider after meeting with oncologist.  Plan: Return again in2weeks.  Diagnosis:MDD, recurrent, moderate    Shaune Pollack, [facility] 04/14/2018

## 2018-04-25 ENCOUNTER — Encounter (HOSPITAL_COMMUNITY): Payer: Self-pay | Admitting: Psychiatry

## 2018-05-09 ENCOUNTER — Other Ambulatory Visit (HOSPITAL_COMMUNITY): Payer: Self-pay | Admitting: Psychiatry

## 2018-05-09 ENCOUNTER — Encounter: Payer: Self-pay | Admitting: Sports Medicine

## 2018-05-17 ENCOUNTER — Ambulatory Visit (INDEPENDENT_AMBULATORY_CARE_PROVIDER_SITE_OTHER): Payer: 59 | Admitting: Psychiatry

## 2018-05-17 ENCOUNTER — Encounter (HOSPITAL_COMMUNITY): Payer: Self-pay | Admitting: Psychiatry

## 2018-05-17 VITALS — BP 126/82 | HR 96 | Ht 62.0 in | Wt 262.0 lb

## 2018-05-17 DIAGNOSIS — F4310 Post-traumatic stress disorder, unspecified: Secondary | ICD-10-CM | POA: Diagnosis not present

## 2018-05-17 DIAGNOSIS — F331 Major depressive disorder, recurrent, moderate: Secondary | ICD-10-CM | POA: Diagnosis not present

## 2018-05-17 DIAGNOSIS — F322 Major depressive disorder, single episode, severe without psychotic features: Secondary | ICD-10-CM | POA: Diagnosis not present

## 2018-05-17 DIAGNOSIS — F332 Major depressive disorder, recurrent severe without psychotic features: Secondary | ICD-10-CM

## 2018-05-17 DIAGNOSIS — F411 Generalized anxiety disorder: Secondary | ICD-10-CM

## 2018-05-17 NOTE — Progress Notes (Signed)
[facility] Outpatient Follow up visit   Patient Identification: Jasmine [name] MRN:  [id number] Date of Evaluation:  05/17/2018 Referral Source: Her therapist Jasmine [name]  Chief Complaint:   Chief Complaint    Follow-up; Depression    I am not doing good.  Visit Diagnosis:    ICD-10-CM   1. 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 Generalized anxiety disorder F41.1   3. Major depressive disorder, recurrent episode, moderate (HCC) F33.1   4. PTSD (post-traumatic stress disorder) F43.10   5. Severe episode of recurrent major depressive disorder, without psychotic features (HCC) F33.2     History of Present Illness: Jasmine [name] is 52 year old African-American married, She has been part of IOP in past and continues therapy with Jasmine [name]  Husband was in chemo but he was losing weight and getting more fatigue so they stopped it he is on symptomatic therapy some 2 hours helped by a caseworker.  Patient is affected by his sickness is also affects his job but she is still struggling to work she may need some time off she is doing therapy in [name] but the therapist is left she is looking for a therapist for referral over there  Trying to deal with coping skills medication helps some but overall she does get stressed out because her situation at home.  Tolerating meds, Works at [employer] and has gone thru some harrassment concerns in the past   Gets anxious and stressed.    Aggravating factor: husband sickness,job stress and have to take days off Modifying factor: some friends  Past Psychiatric History: Patient denies any history of psychiatric inpatient treatment, suicidal attempt, self abusive behavior, traumatic brain injury or any history of abuse.  Previous Psychotropic Medications: Yes   Substance Abuse History in the last 12 months:  No.  Consequences of Substance Abuse: Negative  Past Medical History:  Past Medical History:  Diagnosis Date   . Anxiety   . Arthritis    "knees" (03/28/2014)  . Chronic renal insufficiency    stage II (mild)  . Depression   . Headache    with high blood sugar  . Hypercholesteremia   . Hypertension   . Numbness and tingling of right arm and leg   . Seasonal allergies   . Type II diabetes mellitus (HCC)   . Umbilical hernia without mention of obstruction or gangrene   . Wears glasses     Past Surgical History:  Procedure Laterality Date  . INSERTION OF MESH N/A 03/28/2014   Procedure: INSERTION OF MESH;  Surgeon: Axel Filler, MD;  Location: [facility] OR;  Service: General;  Laterality: N/A;  . INSERTION OF MESH N/A 05/08/2015   Procedure: INSERTION OF MESH;  Surgeon: Axel Filler, MD;  Location: [facility] OR;  Service: General;  Laterality: N/A;  . LAPAROSCOPIC LYSIS OF ADHESIONS N/A 05/08/2015   Procedure: LAPAROSCOPIC LYSIS OF ADHESIONS;  Surgeon: Axel Filler, MD;  Location: [facility] OR;  Service: General;  Laterality: N/A;  . MULTIPLE TOOTH EXTRACTIONS  ~ 2003  . TONSILLECTOMY    . UMBILICAL HERNIA REPAIR  1973  . VENTRAL HERNIA REPAIR N/A 03/28/2014   Procedure: LAPAROSCOPIC VENTRAL HERNIA;  Surgeon: Axel Filler, MD;  Location: [facility] OR;  Service: General;  Laterality: N/A;  . VENTRAL HERNIA REPAIR N/A 05/08/2015   Procedure: LAPAROSCOPIC VENTRAL HERNIA REPAIR WITH MESH;  Surgeon: Axel Filler, MD;  Location: [facility] OR;  Service: General;  Laterality: N/A;    Family Psychiatric History: Viewed  Family  History:  Family History  Problem Relation Age of Onset  . Diabetes Mother   . High Cholesterol Mother   . Obesity Mother   . Diabetes Father   . Cancer Father        Prostate cancer  . Cancer Sister        Cervical cancer  . Diabetes Brother   . Bipolar disorder Brother   . Alcohol abuse Brother   . Drug abuse Brother   . Diabetes Sister   . Diabetes Brother     Social History:   Social History   Socioeconomic History  . Marital status: Married    Spouse name: Jasmine [name]   . Number of children: 1  . Years of education: Not on file  . Highest education level: Not on file  Occupational History  . Not on file  Social Needs  . Financial resource strain: Not on file  . Food insecurity:    Worry: Not on file    Inability: Not on file  . Transportation needs:    Medical: Not on file    Non-medical: Not on file  Tobacco Use  . Smoking status: Former Smoker    Packs/day: 0.25    Years: 26.00    Pack years: 6.50    Types: Cigarettes  . Smokeless tobacco: Never Used  Substance and Sexual Activity  . Alcohol use: Yes    Comment: "have a drink a few times/year"  . Drug use: No  . Sexual activity: Not Currently  Lifestyle  . Physical activity:    Days per week: Not on file    Minutes per session: Not on file  . Stress: Not on file  Relationships  . Social connections:    Talks on phone: Not on file    Gets together: Not on file    Attends religious service: Not on file    Active member of club or organization: Not on file    Attends meetings of clubs or organizations: Not on file    Relationship status: Not on file  Other Topics Concern  . Not on file  Social History Narrative  . Not on file     Allergies:   Allergies  Allergen Reactions  . Eggs Or Egg-Derived Products Hives and Other (See Comments)    FLU VACCINE: caused hives    . Influenza Vaccines Hives    Vaccine from Egg derived product causes Hives  . Effexor [Venlafaxine]     "felt weird"  . Metformin And Related Swelling    ANGIOEDEMA    Metabolic Disorder Labs: Lab Results  Component Value Date   HGBA1C 7.6 (A) 03/08/2018   MPG 384 (H) 01/09/2015   MPG 160 (H) 03/28/2014   No results found for: PROLACTIN Lab Results  Component Value Date   CHOL 180 10/07/2017   TRIG 183 (H) 10/07/2017   HDL 45 10/07/2017   CHOLHDL 4 09/09/2017   VLDL 41.4 (H) 09/09/2017   LDLCALC 98 10/07/2017   LDLCALC 116 (H) 06/16/2016     Current Medications: Current Outpatient  Medications  Medication Sig Dispense Refill  . buPROPion (WELLBUTRIN XL) 300 MG 24 hr tablet TAKE 1 TABLET BY MOUTH EVERY DAY 90 tablet 0  . dapagliflozin propanediol (FARXIGA) 10 MG TABS tablet Take 10 mg by mouth daily. 90 tablet 1  . Dulaglutide (TRULICITY) 1.5 MG/0.5ML SOPN Inject 1.5 mg into the skin once a week. 12 pen 1  . gabapentin (NEURONTIN) 100 MG capsule Take 1  capsule (100 mg total) by mouth 2 (two) times daily. 180 capsule 0  . insulin degludec (TRESIBA FLEXTOUCH) 100 UNIT/ML SOPN FlexTouch Pen Inject 0.35 mLs (35 Units total) into the skin at bedtime. Inject 35 units every pm. 5 pen 0  . olmesartan-hydrochlorothiazide (BENICAR HCT) 20-12.5 MG tablet Take 1 tablet by mouth daily. 90 tablet 1  . sertraline (ZOLOFT) 100 MG tablet Take one and one/half tablet daily. 135 tablet 0  . simvastatin (ZOCOR) 10 MG tablet Take 1 tablet (10 mg total) by mouth daily. 90 tablet 4  . Topiramate ER (TROKENDI XR) 50 MG CP24 Take 1 tablet by mouth daily. 90 capsule 1  . Vitamin D, Ergocalciferol, (DRISDOL) 50000 units CAPS capsule Take 1 capsule (50,000 Units total) by mouth every 7 (seven) days. (Patient not taking: Reported on 05/17/2018) 4 capsule 0   No current facility-administered medications for this visit.       Psychiatric Specialty Exam: Review of Systems  Cardiovascular: Negative for chest pain.  Skin: Negative for rash.  Psychiatric/Behavioral: Positive for depression.    Blood pressure 126/82, pulse 96, height 5\' 2"  (1.575 m), weight 262 lb (118.8 kg), SpO2 99 %.Body mass index is 47.92 kg/m.  General Appearance: Casual and Obese  Eye Contact:  Fair  Speech:  Slow  Volume:  Decreased  Mood: subdued  Affect:constricted  Thought Process:  Goal Directed  Orientation:  Full (Time, Place, and Person)  Thought Content:  Rumination  Suicidal Thoughts:  No  Homicidal Thoughts:  No  Memory:  Immediate;   Fair Recent;   Fair Remote;   Fair  Judgement:  Good  Insight:  Good   Psychomotor Activity:  Decreased  Concentration:  Concentration: Fair and Attention Span: Fair  Recall:  Good  Fund of Knowledge:Good  Language: Good  Akathisia:  No  Handed:  Right  AIMS (if indicated):  0  Assets:  Communication Skills Desire for Improvement Housing  ADL's:  Intact  Cognition: WNL  Sleep:  fair    Treatment Plan Summary: Major depressive disorder, recurrent severe: subdued. Feels to keep meds at current dose. Continue wellbutrin, zoloft Provided supportive therapy.    Generalized anxiety disorder.fluctuates. Continue zoloft and work with coping skills Panic attacks : sporadic, trying to focus on husbands condition . Continue therapy. Continue zoloft Fu 4 w or earlier if needed. May need time off for home needs and job itself gets stressful   Thresa Ross, MD 10/8/201910:32 AM

## 2018-06-01 ENCOUNTER — Ambulatory Visit (INDEPENDENT_AMBULATORY_CARE_PROVIDER_SITE_OTHER): Payer: 59 | Admitting: Family Medicine

## 2018-06-01 VITALS — BP 136/80 | HR 89 | Temp 98.7°F | Ht 62.0 in | Wt 256.0 lb

## 2018-06-01 DIAGNOSIS — E559 Vitamin D deficiency, unspecified: Secondary | ICD-10-CM

## 2018-06-01 DIAGNOSIS — E119 Type 2 diabetes mellitus without complications: Secondary | ICD-10-CM

## 2018-06-01 DIAGNOSIS — Z9189 Other specified personal risk factors, not elsewhere classified: Secondary | ICD-10-CM | POA: Diagnosis not present

## 2018-06-01 DIAGNOSIS — Z6841 Body Mass Index (BMI) 40.0 and over, adult: Secondary | ICD-10-CM

## 2018-06-01 MED ORDER — VITAMIN D (ERGOCALCIFEROL) 1.25 MG (50000 UNIT) PO CAPS: 50000.0000 [IU] | ORAL_CAPSULE | ORAL | 0 refills | Status: DC

## 2018-06-06 NOTE — Progress Notes (Signed)
Office: [phone]  /  Fax: [phone]   HPI:   Chief Complaint: OBESITY Jasmine [name] is here to discuss her progress with her obesity treatment plan. She is on the Category 2 plan + 100 calories and is following her eating plan approximately 10 % of the time. She states she is exercising 0 minutes 0 times per week. Jasmine [name]'s husband is getting back up and moving but theres quite a bit of contempt between her and her husband.  Her weight is 256 lb (116.1 kg) today and has had a weight loss of 6 pounds over a period of 5 months since her last visit. She has lost 7 lbs since starting treatment with [place].  Diabetes II Jasmine [name] has a diagnosis of diabetes type II. Anelle did not bring in her BGs log today, she reports BGs have been uncontrolled in the past 2 weeks. She notes carbohydrate cravings and denies any hypoglycemic episodes. Last A1c was 7.6. She has been working on intensive lifestyle modifications including diet, exercise, and weight loss to help control her blood glucose levels.  Vitamin D Deficiency Jasmine [name] has a diagnosis of vitamin D deficiency. She is currently taking prescription Vit D. She notes fatigue and denies nausea, vomiting or muscle weakness.  At risk for osteopenia and osteoporosis Jasmine [name] is at higher risk of osteopenia and osteoporosis due to vitamin D deficiency.   ALLERGIES: Allergies  Allergen Reactions  . Eggs Or Egg-Derived Products Hives and Other (See Comments)    FLU VACCINE: caused hives    . Influenza Vaccines Hives    Vaccine from Egg derived product causes Hives  . Effexor [Venlafaxine]     "felt weird"  . Metformin And Related Swelling    ANGIOEDEMA    MEDICATIONS: Current Outpatient Medications on File Prior to Visit  Medication Sig Dispense Refill  . buPROPion (WELLBUTRIN XL) 300 MG 24 hr tablet TAKE 1 TABLET BY MOUTH EVERY DAY 90 tablet 0  . dapagliflozin propanediol (FARXIGA) 10 MG TABS tablet Take 10 mg by mouth daily. 90 tablet 1  .  Dulaglutide (TRULICITY) 1.5 MG/0.5ML SOPN Inject 1.5 mg into the skin once a week. 12 pen 1  . gabapentin (NEURONTIN) 100 MG capsule Take 1 capsule (100 mg total) by mouth 2 (two) times daily. 180 capsule 0  . insulin degludec (TRESIBA FLEXTOUCH) 100 UNIT/ML SOPN FlexTouch Pen Inject 0.35 mLs (35 Units total) into the skin at bedtime. Inject 35 units every pm. 5 pen 0  . olmesartan-hydrochlorothiazide (BENICAR HCT) 20-12.5 MG tablet Take 1 tablet by mouth daily. 90 tablet 1  . sertraline (ZOLOFT) 100 MG tablet Take one and one/half tablet daily. 135 tablet 0  . simvastatin (ZOCOR) 10 MG tablet Take 1 tablet (10 mg total) by mouth daily. 90 tablet 4  . Topiramate ER (TROKENDI XR) 50 MG CP24 Take 1 tablet by mouth daily. 90 capsule 1  . [DISCONTINUED] DULoxetine (CYMBALTA) 20 MG capsule Take 1 capsule (20 mg total) by mouth daily. 90 capsule 1   No current facility-administered medications on file prior to visit.     PAST MEDICAL HISTORY: Past Medical History:  Diagnosis Date  . Anxiety   . Arthritis    "knees" (03/28/2014)  . Chronic renal insufficiency    stage II (mild)  . Depression   . Headache    with high blood sugar  . Hypercholesteremia   . Hypertension   . Numbness and tingling of right arm and leg   . Seasonal allergies   .  Type II diabetes mellitus (HCC)   . Umbilical hernia without mention of obstruction or gangrene   . Wears glasses     PAST SURGICAL HISTORY: Past Surgical History:  Procedure Laterality Date  . INSERTION OF MESH N/A 03/28/2014   Procedure: INSERTION OF MESH;  Surgeon: Axel Filler, MD;  Location: [facility] OR;  Service: General;  Laterality: N/A;  . INSERTION OF MESH N/A 05/08/2015   Procedure: INSERTION OF MESH;  Surgeon: Axel Filler, MD;  Location: [facility] OR;  Service: General;  Laterality: N/A;  . LAPAROSCOPIC LYSIS OF ADHESIONS N/A 05/08/2015   Procedure: LAPAROSCOPIC LYSIS OF ADHESIONS;  Surgeon: Axel Filler, MD;  Location: [facility] OR;  Service:  General;  Laterality: N/A;  . MULTIPLE TOOTH EXTRACTIONS  ~ 2003  . TONSILLECTOMY    . UMBILICAL HERNIA REPAIR  1973  . VENTRAL HERNIA REPAIR N/A 03/28/2014   Procedure: LAPAROSCOPIC VENTRAL HERNIA;  Surgeon: Axel Filler, MD;  Location: [facility] OR;  Service: General;  Laterality: N/A;  . VENTRAL HERNIA REPAIR N/A 05/08/2015   Procedure: LAPAROSCOPIC VENTRAL HERNIA REPAIR WITH MESH;  Surgeon: Axel Filler, MD;  Location: [facility] OR;  Service: General;  Laterality: N/A;    SOCIAL HISTORY: Social History   Tobacco Use  . Smoking status: Former Smoker    Packs/day: 0.25    Years: 26.00    Pack years: 6.50    Types: Cigarettes  . Smokeless tobacco: Never Used  Substance Use Topics  . Alcohol use: Yes    Comment: "have a drink a few times/year"  . Drug use: No    FAMILY HISTORY: Family History  Problem Relation Age of Onset  . Diabetes Mother   . High Cholesterol Mother   . Obesity Mother   . Diabetes Father   . Cancer Father        Prostate cancer  . Cancer Sister        Cervical cancer  . Diabetes Brother   . Bipolar disorder Brother   . Alcohol abuse Brother   . Drug abuse Brother   . Diabetes Sister   . Diabetes Brother     ROS: Review of Systems  Constitutional: Positive for malaise/fatigue and weight loss.  Gastrointestinal: Negative for nausea and vomiting.  Musculoskeletal:       Negative muscle weakness  Endo/Heme/Allergies:       Negative hypoglycemia    PHYSICAL EXAM: Blood pressure 136/80, pulse 89, temperature 98.7 F (37.1 C), temperature source Oral, height 5\' 2"  (1.575 m), weight 256 lb (116.1 kg), SpO2 98 %. Body mass index is 46.82 kg/m. Physical Exam  Constitutional: She is oriented to person, place, and time. She appears well-developed and well-nourished.  Cardiovascular: Normal rate.  Pulmonary/Chest: Effort normal.  Musculoskeletal: Normal range of motion.  Neurological: She is oriented to person, place, and time.  Skin: Skin is warm and  dry.  Psychiatric: She has a normal mood and affect. Her behavior is normal.  Vitals reviewed.   RECENT LABS AND TESTS: BMET    Component Value Date/Time   NA 141 10/07/2017 1146   K 5.0 10/07/2017 1146   CL 105 10/07/2017 1146   CO2 23 10/07/2017 1146   GLUCOSE 124 (H) 10/07/2017 1146   GLUCOSE 96 09/09/2017 1430   BUN 12 10/07/2017 1146   CREATININE 1.28 (H) 10/07/2017 1146   CREATININE 1.50 (H) 09/09/2017 1430   CALCIUM 10.0 10/07/2017 1146   GFRNONAA 49 (L) 10/07/2017 1146   GFRNONAA 40 (L) 09/09/2017 1430   GFRAA  56 (L) 10/07/2017 1146   GFRAA 47 (L) 09/09/2017 1430   Lab Results  Component Value Date   HGBA1C 7.6 (A) 03/08/2018   HGBA1C 7.7 (H) 10/07/2017   HGBA1C 8.4 07/12/2017   HGBA1C 7.1 04/07/2017   HGBA1C 7.8 01/22/2017   Lab Results  Component Value Date   INSULIN 24.5 10/07/2017   CBC    Component Value Date/Time   WBC 5.0 10/07/2017 1146   WBC 4.7 11/26/2016 1134   RBC 5.46 (H) 10/07/2017 1146   RBC 5.60 (H) 11/26/2016 1134   HGB 12.8 10/07/2017 1146   HCT 39.3 10/27/2017 1128   PLT 288 11/26/2016 1134   MCV 72 (L) 10/07/2017 1146   MCH 23.4 (L) 10/07/2017 1146   MCH 23.2 (L) 11/26/2016 1134   MCHC 32.4 10/07/2017 1146   MCHC 31.5 (L) 11/26/2016 1134   RDW 16.5 (H) 10/07/2017 1146   LYMPHSABS 2.8 10/07/2017 1146   MONOABS 300 08/19/2016 1401   EOSABS 0.2 10/07/2017 1146   BASOSABS 0.1 10/07/2017 1146   Iron/TIBC/Ferritin/ %Sat    Component Value Date/Time   IRON 76 10/27/2017 1128   TIBC 375 10/27/2017 1128   FERRITIN 48 10/27/2017 1128   IRONPCTSAT 20 10/27/2017 1128   Lipid Panel     Component Value Date/Time   CHOL 180 10/07/2017 1146   TRIG 183 (H) 10/07/2017 1146   HDL 45 10/07/2017 1146   CHOLHDL 4 09/09/2017 1430   VLDL 41.4 (H) 09/09/2017 1430   LDLCALC 98 10/07/2017 1146   LDLDIRECT 103.0 09/09/2017 1430   Hepatic Function Panel     Component Value Date/Time   PROT 7.2 10/07/2017 1146   ALBUMIN 4.5 10/07/2017 1146    AST 11 10/07/2017 1146   ALT 13 10/07/2017 1146   ALKPHOS 73 10/07/2017 1146   BILITOT <0.2 10/07/2017 1146      Component Value Date/Time   TSH 1.230 10/07/2017 1146   TSH 1.36 11/26/2016 1134   TSH 0.860 02/08/2015 0945  Results for JANAA, ACERO (MRN [id number]) as of 06/06/2018 14:35  Ref. Range 10/27/2017 11:28  Vitamin D, 25-Hydroxy Latest Ref Range: 30.0 - 100.0 ng/mL 10.5 (L)    ASSESSMENT AND PLAN: Type 2 diabetes mellitus without complication, without long-term current use of insulin (HCC)  Vitamin D deficiency - Plan: Vitamin D, Ergocalciferol, (DRISDOL) 50000 units CAPS capsule  At risk for osteoporosis  Class 3 severe obesity with serious comorbidity and body mass index (BMI) of 45.0 to 49.9 in adult, unspecified obesity type (HCC)  PLAN:  Diabetes II Rosine has been given extensive diabetes education by myself today including ideal fasting and post-prandial blood glucose readings, individual ideal Hgb A1c goals and hypoglycemia prevention. We discussed the importance of good blood sugar control to decrease the likelihood of diabetic complications such as nephropathy, neuropathy, limb loss, blindness, coronary artery disease, and death. We discussed the importance of intensive lifestyle modification including diet, exercise and weight loss as the first line treatment for diabetes. Mylan agrees to continue her diabetes medications at current dose, continue Category 2 plan, and she was given a BGs log today. Jillisa agrees to follow up with our clinic in 2 weeks.  Vitamin D Deficiency Beyza was informed that low vitamin D levels contributes to fatigue and are associated with obesity, breast, and colon cancer. Zareah agrees to continue taking prescription Vit D @50 ,000 IU every week #4 and we will refill for 1 month. She will follow up for routine testing of vitamin D, at  least 2-3 times per year. She was informed of the risk of over-replacement of vitamin D and  agrees to not increase her dose unless she discusses this with [place] first. We will recheck labs at next appointment. Xaria agrees to follow up with our clinic in 2 weeks.  At risk for osteopenia and osteoporosis Shaniqwa was given extended (15 minutes) osteoporosis prevention counseling today. Lillyann is at risk for osteopenia and osteoporsis due to her vitamin D deficiency. She was encouraged to take her vitamin D and follow her higher calcium diet and increase strengthening exercise to help strengthen her bones and decrease her risk of osteopenia and osteoporosis.  Obesity Dazani is currently in the action stage of change. As such, her goal is to continue with weight loss efforts She has agreed to follow the Category 2 plan + 100 calories Aoki has been instructed to work up to a goal of 150 minutes of combined cardio and strengthening exercise per week for weight loss and overall health benefits. We discussed the following Behavioral Modification Strategies today: increasing lean protein intake, increasing vegetables, no skipping meals, and planning for success   Grover has agreed to follow up with our clinic in 2 weeks. She was informed of the importance of frequent follow up visits to maximize her success with intensive lifestyle modifications for her multiple health conditions.   OBESITY BEHAVIORAL INTERVENTION VISIT  Today's visit was # 6   Starting weight: 263 lbs Starting date: 10/07/17 Today's weight : 256 lbs Today's date: 06/01/2018 Total lbs lost to date: 7    ASK: We discussed the diagnosis of obesity with [name] today and Loree agreed to give [place] permission to discuss obesity behavioral modification therapy today.  ASSESS: Xayla has the diagnosis of obesity and her BMI today is 46.81 Enma is in the action stage of change   ADVISE: Angelica was educated on the multiple health risks of obesity as well as the benefit of weight loss to improve her  health. She was advised of the need for long term treatment and the importance of lifestyle modifications to improve her current health and to decrease her risk of future health problems.  AGREE: Multiple dietary modification options and treatment options were discussed and  Bettie agreed to follow the recommendations documented in the above note.  ARRANGE: Huntley was educated on the importance of frequent visits to treat obesity as outlined per CMS and USPSTF guidelines and agreed to schedule her next follow up appointment today.  I, Burt Knack, am acting as transcriptionist for Debbra Riding, MD  I have reviewed the above documentation for accuracy and completeness, and I agree with the above. - Debbra Riding, MD

## 2018-06-07 ENCOUNTER — Other Ambulatory Visit (HOSPITAL_COMMUNITY): Payer: PRIVATE HEALTH INSURANCE | Attending: Psychiatry | Admitting: Licensed Clinical Social Worker

## 2018-06-07 ENCOUNTER — Encounter (HOSPITAL_COMMUNITY): Payer: Self-pay | Admitting: Psychiatry

## 2018-06-07 DIAGNOSIS — Z833 Family history of diabetes mellitus: Secondary | ICD-10-CM | POA: Insufficient documentation

## 2018-06-07 DIAGNOSIS — I129 Hypertensive chronic kidney disease with stage 1 through stage 4 chronic kidney disease, or unspecified chronic kidney disease: Secondary | ICD-10-CM | POA: Insufficient documentation

## 2018-06-07 DIAGNOSIS — Z87891 Personal history of nicotine dependence: Secondary | ICD-10-CM | POA: Insufficient documentation

## 2018-06-07 DIAGNOSIS — Z8489 Family history of other specified conditions: Secondary | ICD-10-CM | POA: Insufficient documentation

## 2018-06-07 DIAGNOSIS — N182 Chronic kidney disease, stage 2 (mild): Secondary | ICD-10-CM | POA: Insufficient documentation

## 2018-06-07 DIAGNOSIS — E1122 Type 2 diabetes mellitus with diabetic chronic kidney disease: Secondary | ICD-10-CM | POA: Insufficient documentation

## 2018-06-07 DIAGNOSIS — F331 Major depressive disorder, recurrent, moderate: Secondary | ICD-10-CM | POA: Insufficient documentation

## 2018-06-07 DIAGNOSIS — Z794 Long term (current) use of insulin: Secondary | ICD-10-CM | POA: Insufficient documentation

## 2018-06-07 DIAGNOSIS — E7800 Pure hypercholesterolemia, unspecified: Secondary | ICD-10-CM | POA: Insufficient documentation

## 2018-06-07 DIAGNOSIS — Z79899 Other long term (current) drug therapy: Secondary | ICD-10-CM | POA: Insufficient documentation

## 2018-06-07 NOTE — Progress Notes (Unsigned)
Psychiatric Initial Adult Assessment   Patient Identification: Jasmine [name] MRN:  [id number] Date of Evaluation:  06/10/2018 Referral Source: Previous attended IOP   Chief Complaint:   Chief Complaint    Depression; Anxiety; Stress     Visit Diagnosis:    ICD-10-CM   1. Major depressive disorder, recurrent episode, moderate (HCC) F33.1     History of Present Illness: Jasmine [name] 52 year old African-American female presents with worsening depression related to multiple stressors.  Continues to report she is employed by [employer] as she reports she is attempted to look for other options of employment without success.  Reports recent passing of a stepbrother and states her employer did not excuse her absences to attend her brothers funeral.   Jasmine [name] reports her husband is currently battling cancer and she reports his mood has been irritable and "edgy". Reports she is under a lot pressure and stress related to his healthcare needs. States she feels like she is experiencing caregiver burnout.  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  She provides transportation for her husband to and from doctor's appointments.  The patient is currently followed by psychiatrist Gilmore Laroche and is actively seeking a therapist at this time.   Reported mood irritability,difficulty concentrating and insomnia. Reports thoughts feeling worthlessness and  Hopelessness. Jasmine [name] reports she feels as if she is neglected her own healthcare needs trying to take care of others.  Patient denies suicidal or homicidal ideations during this assessment.  Presents flat, guarded and depressed.  Reports she is taking medications as prescribed and tolerating them well.  Support encouragement reassurance was provided.  Associated Signs/Symptoms: Depression Symptoms:  depressed mood, (Hypo) Manic Symptoms:  Distractibility, Anxiety Symptoms:  Excessive  Worry, Psychotic Symptoms:  Hallucinations: None PTSD Symptoms: NA      Past Psychiatric History: Previously attended intensive outpatient program  Previous Psychotropic Medications: Yes   Substance Abuse History in the last 12 months:  No.  Consequences of Substance Abuse: NA  Past Medical History:  Past Medical History:  Diagnosis Date  . Anxiety   . Arthritis    "knees" (03/28/2014)  . Chronic renal insufficiency    stage II (mild)  . Depression   . Headache    with high blood sugar  . Hypercholesteremia   . Hypertension   . Numbness and tingling of right arm and leg   . Seasonal allergies   . Type II diabetes mellitus (HCC)   . Umbilical hernia without mention of obstruction or gangrene   . Wears glasses     Past Surgical History:  Procedure Laterality Date  . INSERTION OF MESH N/A 03/28/2014   Procedure: INSERTION OF MESH;  Surgeon: Axel Filler, MD;  Location: [facility] OR;  Service: General;  Laterality: N/A;  . INSERTION OF MESH N/A 05/08/2015   Procedure: INSERTION OF MESH;  Surgeon: Axel Filler, MD;  Location: [facility] OR;  Service: General;  Laterality: N/A;  . LAPAROSCOPIC LYSIS OF ADHESIONS N/A 05/08/2015   Procedure: LAPAROSCOPIC LYSIS OF ADHESIONS;  Surgeon: Axel Filler, MD;  Location: [facility] OR;  Service: General;  Laterality: N/A;  . MULTIPLE TOOTH EXTRACTIONS  ~ 2003  . TONSILLECTOMY    . UMBILICAL HERNIA REPAIR  1973  . VENTRAL HERNIA REPAIR N/A 03/28/2014   Procedure: LAPAROSCOPIC VENTRAL HERNIA;  Surgeon: Axel Filler, MD;  Location: [facility] OR;  Service: General;  Laterality: N/A;  . VENTRAL HERNIA REPAIR N/A 05/08/2015  Procedure: LAPAROSCOPIC VENTRAL HERNIA REPAIR WITH MESH;  Surgeon: Axel Filler, MD;  Location: [facility] OR;  Service: General;  Laterality: N/A;    Family Psychiatric History:   Family History:  Family History  Problem Relation Age of Onset  . Diabetes Mother   . High Cholesterol Mother   . Obesity Mother   . Diabetes Father   .  Cancer Father        Prostate cancer  . Cancer Sister        Cervical cancer  . Diabetes Brother   . Bipolar disorder Brother   . Alcohol abuse Brother   . Drug abuse Brother   . Diabetes Sister   . Diabetes Brother     Social History:   Social History   Socioeconomic History  . Marital status: Married    Spouse name: Florencia Reasons  . Number of children: 1  . Years of education: Not on file  . Highest education level: Not on file  Occupational History  . Not on file  Social Needs  . Financial resource strain: Not on file  . Food insecurity:    Worry: Not on file    Inability: Not on file  . Transportation needs:    Medical: Not on file    Non-medical: Not on file  Tobacco Use  . Smoking status: Former Smoker    Packs/day: 0.25    Years: 26.00    Pack years: 6.50    Types: Cigarettes  . Smokeless tobacco: Never Used  Substance and Sexual Activity  . Alcohol use: Yes    Comment: "have a drink a few times/year"  . Drug use: No  . Sexual activity: Not Currently  Lifestyle  . Physical activity:    Days per week: Not on file    Minutes per session: Not on file  . Stress: Not on file  Relationships  . Social connections:    Talks on phone: Not on file    Gets together: Not on file    Attends religious service: Not on file    Active member of club or organization: Not on file    Attends meetings of clubs or organizations: Not on file    Relationship status: Not on file  Other Topics Concern  . Not on file  Social History Narrative  . Not on file    Additional Social History: ***  Allergies:   Allergies  Allergen Reactions  . Eggs Or Egg-Derived Products Hives and Other (See Comments)    FLU VACCINE: caused hives    . Influenza Vaccines Hives    Vaccine from Egg derived product causes Hives  . Effexor [Venlafaxine]     "felt weird"  . Metformin And Related Swelling    ANGIOEDEMA    Metabolic Disorder Labs: Lab Results  Component Value Date    HGBA1C 7.6 (A) 03/08/2018   MPG 384 (H) 01/09/2015   MPG 160 (H) 03/28/2014   No results found for: PROLACTIN Lab Results  Component Value Date   CHOL 180 10/07/2017   TRIG 183 (H) 10/07/2017   HDL 45 10/07/2017   CHOLHDL 4 09/09/2017   VLDL 41.4 (H) 09/09/2017   LDLCALC 98 10/07/2017   LDLCALC 116 (H) 06/16/2016     Current Medications: Current Outpatient Medications  Medication Sig Dispense Refill  . buPROPion (WELLBUTRIN XL) 300 MG 24 hr tablet TAKE 1 TABLET BY MOUTH EVERY DAY 90 tablet 0  . dapagliflozin propanediol (FARXIGA) 10 MG TABS tablet Take 10  mg by mouth daily. 90 tablet 1  . Dulaglutide (TRULICITY) 1.5 MG/0.5ML SOPN Inject 1.5 mg into the skin once a week. 12 pen 1  . gabapentin (NEURONTIN) 100 MG capsule Take 1 capsule (100 mg total) by mouth 2 (two) times daily. 180 capsule 0  . insulin degludec (TRESIBA FLEXTOUCH) 100 UNIT/ML SOPN FlexTouch Pen Inject 0.35 mLs (35 Units total) into the skin at bedtime. Inject 35 units every pm. 5 pen 0  . olmesartan-hydrochlorothiazide (BENICAR HCT) 20-12.5 MG tablet Take 1 tablet by mouth daily. 90 tablet 1  . sertraline (ZOLOFT) 100 MG tablet Take one and one/half tablet daily. 135 tablet 0  . simvastatin (ZOCOR) 10 MG tablet Take 1 tablet (10 mg total) by mouth daily. 90 tablet 4  . Topiramate ER (TROKENDI XR) 50 MG CP24 Take 1 tablet by mouth daily. 90 capsule 1  . Vitamin D, Ergocalciferol, (DRISDOL) 50000 units CAPS capsule Take 1 capsule (50,000 Units total) by mouth every 7 (seven) days. 4 capsule 0   No current facility-administered medications for this visit.     Neurologic: Headache: No Seizure: No Paresthesias:No  Musculoskeletal: Strength & Muscle Tone: within normal limits Gait & Station: normal Patient leans: N/A  Psychiatric Specialty Exam: Review of Systems  Psychiatric/Behavioral: Positive for depression. Negative for hallucinations and suicidal ideas. The patient is nervous/anxious and has insomnia.    All other systems reviewed and are negative.   There were no vitals taken for this visit.There is no height or weight on file to calculate BMI.  General Appearance: Casual  Eye Contact:  Fair  Speech:  Clear and Coherent  Volume:  Normal  Mood:  Anxious and Depressed  Affect:  Congruent  Thought Process:  Coherent  Orientation:  Full (Time, Place, and Person)  Thought Content:  WDL  Suicidal Thoughts:  No  Homicidal Thoughts:  No  Memory:  Immediate;   Fair Recent;   Fair Remote;   Fair  Judgement:  Fair  Insight:  Fair  Psychomotor Activity:  Normal  Concentration:  Concentration: Fair  Recall:  [employer] of Knowledge:Fair  Language: Fair  Akathisia:  No  Handed:  Right  AIMS (if indicated):    Assets:  Communication Skills Desire for Improvement Social Support  ADL's:  Intact  Cognition: WNL  Sleep:      Treatment Plan Summary: Admit to IOP Intensive Outpatient Program  Medication management- continue as prescribed    Treatment plan was reviewed and agreed upon by NPT Lewis and patient Jasmine [name] need for group services     Oneta Rack, NP 11/1/20199:21 AM
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
## 2018-06-08 ENCOUNTER — Other Ambulatory Visit (HOSPITAL_COMMUNITY): Payer: PRIVATE HEALTH INSURANCE | Admitting: Psychiatry

## 2018-06-08 DIAGNOSIS — N182 Chronic kidney disease, stage 2 (mild): Secondary | ICD-10-CM | POA: Diagnosis not present

## 2018-06-08 DIAGNOSIS — E7800 Pure hypercholesterolemia, unspecified: Secondary | ICD-10-CM | POA: Diagnosis not present

## 2018-06-08 DIAGNOSIS — Z87891 Personal history of nicotine dependence: Secondary | ICD-10-CM | POA: Diagnosis not present

## 2018-06-08 DIAGNOSIS — Z8489 Family history of other specified conditions: Secondary | ICD-10-CM | POA: Diagnosis not present

## 2018-06-08 DIAGNOSIS — F331 Major depressive disorder, recurrent, moderate: Secondary | ICD-10-CM | POA: Diagnosis not present

## 2018-06-08 DIAGNOSIS — F419 Anxiety disorder, unspecified: Secondary | ICD-10-CM | POA: Diagnosis present

## 2018-06-08 DIAGNOSIS — Z833 Family history of diabetes mellitus: Secondary | ICD-10-CM | POA: Diagnosis not present

## 2018-06-08 DIAGNOSIS — Z794 Long term (current) use of insulin: Secondary | ICD-10-CM | POA: Diagnosis not present

## 2018-06-08 DIAGNOSIS — F329 Major depressive disorder, single episode, unspecified: Secondary | ICD-10-CM | POA: Diagnosis present

## 2018-06-08 DIAGNOSIS — Z79899 Other long term (current) drug therapy: Secondary | ICD-10-CM | POA: Diagnosis not present

## 2018-06-08 DIAGNOSIS — I129 Hypertensive chronic kidney disease with stage 1 through stage 4 chronic kidney disease, or unspecified chronic kidney disease: Secondary | ICD-10-CM | POA: Diagnosis not present

## 2018-06-08 DIAGNOSIS — E1122 Type 2 diabetes mellitus with diabetic chronic kidney disease: Secondary | ICD-10-CM | POA: Diagnosis not present

## 2018-06-08 NOTE — Progress Notes (Signed)
    Daily Group Progress Note  Program: IOP  Group Time:   Participation Level: Active  Behavioral Response: Appropriate  Type of Therapy:  Group Therapy; psycho-educational group, process group  Summary of Progress:  The purpose of this group is to utilize CBT and DBT skills in a group setting to increase use of healthy coping skills and decrease active mental health symptoms. Clinician utilizes time for processing and skill building.  9am-10;30am Completed discussion on DBT House activity, focusing on supportive people and protective factors, things clients are proud of themselves for, and reviewing values. Clinician reminded clients of CBT triangle and connection of thoughts, emotions, and behaviors. Clinician and group members discussed creating alternative thoughts can potentially create alternative feelings, often altering our behaviors. Clinician challenged group members to identify specific thoughts and feelings related to some behaviors demonstrated in group. Clinician and group members completed Moment of Joy worksheet and discussed the helpfulness of including gratitude into daily practices.  10:30am-12pm Clinician presented the topic of vulnerability. Clinician shared examples of client vulnerability in group setting. Clinician inquired about what vulnerability means to client and why they find vulnerability difficult. Clinician processed with gropu members the struggle to open and engaged with others when it has not gone well in the past. Clinician presented Dewain Penning video, The Power of Vulnerability. Clinician facilitated discussion with group members related to the topic of vulnerability. Clinician inquired about thoughts and feelings related to topic. Clinician and group members discussed benefits of showing vulnerability and barriers to opening up. Clinician praised group member participation and willingness. Clinician requested each client identify a small self care activity to  complete before the following group. Client participated in group discussions this day. Client reports being upset about how a conversation with her daughter was handled, and shared the feelings related to the situation. Client was receptive to feedback from the group in creating alternative thought related to an 'opportunity for growth' which she identified elicited a different feeling. Client reports she views vulnerability as weakness but was open to feedback that she showed vulnerability in group by sharing how she handled the conversation with her daughter. Client reports though she did not use a skill during this conversation but plans to work on setting boundaries in the relationship.  Harlon Ditty, LCSW

## 2018-06-09 ENCOUNTER — Other Ambulatory Visit (HOSPITAL_COMMUNITY): Payer: PRIVATE HEALTH INSURANCE | Admitting: Licensed Clinical Social Worker

## 2018-06-09 DIAGNOSIS — F331 Major depressive disorder, recurrent, moderate: Secondary | ICD-10-CM

## 2018-06-10 ENCOUNTER — Other Ambulatory Visit (HOSPITAL_COMMUNITY): Payer: 59 | Attending: Psychiatry | Admitting: Licensed Clinical Social Worker

## 2018-06-10 ENCOUNTER — Encounter (HOSPITAL_COMMUNITY): Payer: Self-pay | Admitting: Family

## 2018-06-10 DIAGNOSIS — F419 Anxiety disorder, unspecified: Secondary | ICD-10-CM | POA: Insufficient documentation

## 2018-06-10 DIAGNOSIS — F331 Major depressive disorder, recurrent, moderate: Secondary | ICD-10-CM

## 2018-06-10 DIAGNOSIS — F329 Major depressive disorder, single episode, unspecified: Secondary | ICD-10-CM | POA: Diagnosis not present

## 2018-06-13 ENCOUNTER — Other Ambulatory Visit (HOSPITAL_COMMUNITY): Payer: 59 | Admitting: Licensed Clinical Social Worker

## 2018-06-13 DIAGNOSIS — F331 Major depressive disorder, recurrent, moderate: Secondary | ICD-10-CM

## 2018-06-13 DIAGNOSIS — F329 Major depressive disorder, single episode, unspecified: Secondary | ICD-10-CM | POA: Diagnosis not present

## 2018-06-13 NOTE — Progress Notes (Signed)
    Daily Group Progress Note  Program: IOP  Group Time: 9am-12pm  Participation Level: Active  Behavioral Response: Appropriate  Type of Therapy:  Group Therapy; process group, psychoeducational group  Summary of Progress:  The purpose of this group is to utilize CBT and DBT skills in a group setting to increase use of healthy coping skills and decrease active mental health symptoms. Clinician utilizes time for processing and skill building.  9am-11am Clinician presented the activity of dual self-portrait to address self esteem and authentic self. Clinician requested clients complete activity with half the face as their "inner, true self" and half as the mask they show to the world. Clinician facilitated discussion related to client sharing. Clinician provided education related to controlling relationships and normalized behavioral responses. Clinician and group members processed loss of sense of self and barriers to setting boundaries.  11am-12pm Clinician and PA student provided psycho-education to clients related to healthy eating habits and sleep hygiene and the effect on wellness. Clinician provided list of self care activities and encouraged clients to set a goal of at least one weekly. Clinician requested clients check out with a positive trait about themselves. Client participated in group activity and discussions. Client shared different parts of self based on surrounds and what is socially acceptable, for example policies at work vs expectations of family.  Harlon Ditty, LCSW

## 2018-06-13 NOTE — Progress Notes (Signed)
    Daily Group Progress Note  Program: IOP  Group Time: 9am-12pm  Participation Level: Active  Behavioral Response: Appropriate and Sharing  Type of Therapy:  Group Therapy; psycho-edcational group, progress group  Summary of Progress:  9am-10pm Clinician provided psycho-educational material on ETF (Emotional Freedom Techniques) Tapping. Clinician provided exercise for group to practice in session. Clinician facilitated discussion following activity to process any follow up thoughts or feelings.  10am-12pm Clinician presented the topic of boundaries. Clinician provided educational material on definintion of bounchries, types of personalities in relation to boundaries, examples of how to set boundaries, and common myths related to boundaries. Clinician processed with group members current boundaries set and boundaries group members would like to set. Group processed barriers to setting boundaries with different people and in different situation. Clinician and group members problem solved some option. Clinician praised group members for identifying boundaries currently in place and for sharing how/why they were put in place.  Clinician requested group members share a self care activity planned for the weekend. Client shared about frustration with boundary setting and needs not being met by family members which is why she is considering changing boundaries with some family members. Client was challenged by group and clinician if they verbalized specific needs to family members. Clinician reminded group about mind reading and a distorted thinking style and the importance of verbalizing our wants/needs to make them known to others. Client was initially resistant but became receptive of feedback, verbalizing frustration of others not having the same heart/view as she does.  Karissa A Brone, LCSW 

## 2018-06-14 ENCOUNTER — Ambulatory Visit (INDEPENDENT_AMBULATORY_CARE_PROVIDER_SITE_OTHER): Payer: 59 | Admitting: Family Medicine

## 2018-06-14 ENCOUNTER — Encounter (INDEPENDENT_AMBULATORY_CARE_PROVIDER_SITE_OTHER): Payer: Self-pay

## 2018-06-14 ENCOUNTER — Encounter (INDEPENDENT_AMBULATORY_CARE_PROVIDER_SITE_OTHER): Payer: Self-pay | Admitting: Family Medicine

## 2018-06-14 ENCOUNTER — Telehealth (HOSPITAL_COMMUNITY): Payer: Self-pay | Admitting: Psychiatry

## 2018-06-14 VITALS — BP 125/63 | HR 73 | Temp 97.9°F | Ht 62.0 in | Wt 253.0 lb

## 2018-06-14 DIAGNOSIS — E7849 Other hyperlipidemia: Secondary | ICD-10-CM

## 2018-06-14 DIAGNOSIS — Z794 Long term (current) use of insulin: Secondary | ICD-10-CM

## 2018-06-14 DIAGNOSIS — Z9189 Other specified personal risk factors, not elsewhere classified: Secondary | ICD-10-CM

## 2018-06-14 DIAGNOSIS — E538 Deficiency of other specified B group vitamins: Secondary | ICD-10-CM

## 2018-06-14 DIAGNOSIS — E559 Vitamin D deficiency, unspecified: Secondary | ICD-10-CM | POA: Diagnosis not present

## 2018-06-14 DIAGNOSIS — E1122 Type 2 diabetes mellitus with diabetic chronic kidney disease: Secondary | ICD-10-CM | POA: Diagnosis not present

## 2018-06-14 DIAGNOSIS — N183 Chronic kidney disease, stage 3 unspecified: Secondary | ICD-10-CM

## 2018-06-14 DIAGNOSIS — F3289 Other specified depressive episodes: Secondary | ICD-10-CM

## 2018-06-14 DIAGNOSIS — Z6841 Body Mass Index (BMI) 40.0 and over, adult: Secondary | ICD-10-CM

## 2018-06-14 NOTE — Progress Notes (Signed)
    Daily Group Progress Note  Program: IOP  Group Time: 9am-12pm  Participation Level: Active  Behavioral Response: Appropriate  Type of Therapy:  Group Therapy; psycho-educational group, process group  Summary of Progress:  The purpose of group is to utilize CBT and DBT skills in a group setting to help clients increase knowledge and use of healthy coping skills and decrease intensity of active mental health symptoms.  9am-10:30am The clinician greeted members and inquired about symptoms and concerns, assessing for SI/HI psychosis and overall level of functioning. The first part of group was led by the pharmacist regarding medications and concerns that group members wanted to address. The pharmacist inquired about clients' concerns regarding their medications and sides effects that they are experiencing. The pharmacist actively listened to each client and responded to their specific concerns. Pharmacist provided psycho-education related to various medications and illicit substances and their impacts on functionality. She also discussed the importance of managing medications and reserving the use of certain medications to improve functionality. Clinician provided additional psycho-educational information related to criteria for diagnostic criteria for substance use disorders.  10:30am-12pm Clinician presented the topic of "authenticity," and engaged clients into discussions about how they present their true self, and beliefs. Clinician prompted clients to explore their struggles in practicing authenticity to find balance. Clinician facilitated discussion with clients on evaluating their core beliefs and the importance of authenticity in their daily life. Clinician and group members processed levels of access given to people in their lives and how what is shown on the outside is still based on decisions from core values even if different people see different things, example a work, family, and  public sense of self. Clinician provided options for clients to maintain awareness of self and the present moment with a breathing/meditation exercise, and gratitude.  Client had several questions related to her husbands medication as well as physical responses to interactions with medications and marijuana. Client reports continued frustration with work and husband. Client shared she did attempt to take group's advice and verbalize her needs to her family related to finances, but dis-engaged after follow up questions. Client reports she was surprised by the response and decided not to officially ask for money until it was actually needed.   Harlon Ditty, LCSW

## 2018-06-14 NOTE — Telephone Encounter (Signed)
D:  Pt had informed group leader that she wouldn't be attending MH-IOP today due to an appointment.

## 2018-06-14 NOTE — Progress Notes (Signed)
Office: [phone]  /  Fax: [phone]   HPI:   Chief Complaint: OBESITY Jasmine [name] is here to discuss her progress with her obesity treatment plan. She is on the  follow the Category 2 plan plus 100 calories and is following her eating plan approximately 45% of the time. She states she is walking for 30 minutes 3 times per week. Jasmine [name] is currently struggling with increased stress in her life which prevents her from sticking to her meal plan as much as she would like. Some days she is not hungry at all and not eating all of her food.  Her husband has cancer and she recently found out that her 38 yo daughter who lives with her is pregnant. Her daughter has a 44 yo child also.  Her weight is 253 lb (114.8 kg) today and has had a weight loss of 3 pounds over a period of 2 weeks since her last visit. She has lost 10 lbs since starting treatment with [place].  B12 Deficiency Jasmine [name] has a diagnosis of B12 insufficiency and notes fatigue and paresthesias in her fingers. This is a new diagnosis. Jasmine [name] is not a vegetarian and does not have a previous diagnosis of pernicious anemia. She does not have a history of weight loss surgery. She is not currently taking OTC vitamin B12.  Diabetes II Jasmine [name] has a diagnosis of diabetes type II. Rin states BGs are high in the morning, BGs are high at bedtime, symptomatic hypoglycemia does not occur and denies any hypoglycemic episodes. FBS are 256-350 and CBGs at bedtime are in the 300s. She reports polyphagia and desires sweets. Last A1c was Hemoglobin A1C Latest Ref Rng & Units 03/08/2018 10/07/2017 07/12/2017  HGBA1C 4.0 - 5.6 % 7.6(A) 7.7(H) 8.4  Some recent data might be hidden    She has been working on intensive lifestyle modifications including diet, exercise, and weight loss to help control her blood glucose levels. She is currently take 354 units of Tresiba at bedtime, Farxiga 10 mg daily, and 1.5 mg of dulatglutide weekly.  Vitamin D  deficiency Jasmine [name] has a diagnosis of vitamin D deficiency. She is currently taking vit D and denies nausea, vomiting or muscle weakness.  At risk for osteopenia and osteoporosis Jasmine [name] is at higher risk of osteopenia and osteoporosis due to vitamin D deficiency.   Hyperlipidemia Jasmine [name] has hyperlipidemia and has been trying to improve her cholesterol levels with intensive lifestyle modification including a low saturated fat diet, exercise and weight loss. She denies any chest painor myalgias. The 10-year ASCVD risk score Denman George DC Montez Hageman., et al., 2013) is: 8.8%   Values used to calculate the score:     Age: 71 years     Sex: Female     Is Non-Hispanic African American: Yes     Diabetic: Yes     Tobacco smoker: No     Systolic Blood Pressure: 125 mmHg     Is BP treated: Yes     HDL Cholesterol: 45 mg/dL     Total Cholesterol: 180 mg/dL  Depression with emotional eating behaviors Jasmine [name] is struggling with depression and emotional eating and using food for comfort to the extent that it is negatively impacting her health. She often snacks when she is not hungry. Jasmine [name] sometimes feels she is out of control and then feels guilty that she made poor food choices. She has been working on behavior modification techniques to help reduce her emotional eating and has been minimally successful. She shows no sign  of suicidal or homicidal ideations. She sees a psychiatrist and is currently attending intensive outpatient therapy at [facility] which she started last week. She does not currently have a therapist but is searching for one.  She is currently taking Wellbutrin and sertraline.   Depression screen [facility] 2/9 03/08/2018 10/07/2017 05/20/2017 10/20/2016  Decreased Interest 3 2 3  0  Down, Depressed, Hopeless 3 2 3 2   PHQ - 2 Score 6 4 6 2   Altered sleeping 3 0 3 2  Tired, decreased energy 3 1 3 3   Change in appetite 2 3 3 1   Feeling bad or failure about yourself  2 2 2 1   Trouble concentrating 3 3 3  1   Moving slowly or fidgety/restless 2 1 3  0  Suicidal thoughts 0 0 0 0  PHQ-9 Score 21 14 23 10   Difficult doing work/chores Somewhat difficult Somewhat difficult Extremely dIfficult -  Some encounter information is confidential and restricted. Go to Review Flowsheets activity to see all data.  Some recent data might be hidden      ALLERGIES: Allergies  Allergen Reactions  . Eggs Or Egg-Derived Products Hives and Other (See Comments)    FLU VACCINE: caused hives    . Influenza Vaccines Hives    Vaccine from Egg derived product causes Hives  . Effexor [Venlafaxine]     "felt weird"  . Metformin And Related Swelling    ANGIOEDEMA    MEDICATIONS: Current Outpatient Medications on File Prior to Visit  Medication Sig Dispense Refill  . buPROPion (WELLBUTRIN XL) 300 MG 24 hr tablet TAKE 1 TABLET BY MOUTH EVERY DAY 90 tablet 0  . dapagliflozin propanediol (FARXIGA) 10 MG TABS tablet Take 10 mg by mouth daily. 90 tablet 1  . Dulaglutide (TRULICITY) 1.5 MG/0.5ML SOPN Inject 1.5 mg into the skin once a week. 12 pen 1  . gabapentin (NEURONTIN) 100 MG capsule Take 1 capsule (100 mg total) by mouth 2 (two) times daily. 180 capsule 0  . insulin degludec (TRESIBA FLEXTOUCH) 100 UNIT/ML SOPN FlexTouch Pen Inject 0.35 mLs (35 Units total) into the skin at bedtime. Inject 35 units every pm. (Patient taking differently: Inject 40 Units into the skin at bedtime. Inject 35 units every pm.) 5 pen 0  . olmesartan-hydrochlorothiazide (BENICAR HCT) 20-12.5 MG tablet Take 1 tablet by mouth daily. 90 tablet 1  . sertraline (ZOLOFT) 100 MG tablet Take one and one/half tablet daily. 135 tablet 0  . simvastatin (ZOCOR) 10 MG tablet Take 1 tablet (10 mg total) by mouth daily. 90 tablet 4  . Topiramate ER (TROKENDI XR) 50 MG CP24 Take 1 tablet by mouth daily. 90 capsule 1  . Vitamin D, Ergocalciferol, (DRISDOL) 50000 units CAPS capsule Take 1 capsule (50,000 Units total) by mouth every 7 (seven) days. 4 capsule  0  . [DISCONTINUED] DULoxetine (CYMBALTA) 20 MG capsule Take 1 capsule (20 mg total) by mouth daily. 90 capsule 1   No current facility-administered medications on file prior to visit.     PAST MEDICAL HISTORY: Past Medical History:  Diagnosis Date  . Anxiety   . Arthritis    "knees" (03/28/2014)  . Chronic renal insufficiency    stage II (mild)  . Depression   . Headache    with high blood sugar  . Hypercholesteremia   . Hypertension   . Numbness and tingling of right arm and leg   . Seasonal allergies   . Type II diabetes mellitus (HCC)   . Umbilical hernia without  mention of obstruction or gangrene   . Wears glasses     PAST SURGICAL HISTORY: Past Surgical History:  Procedure Laterality Date  . INSERTION OF MESH N/A 03/28/2014   Procedure: INSERTION OF MESH;  Surgeon: Axel Filler, MD;  Location: [facility] OR;  Service: General;  Laterality: N/A;  . INSERTION OF MESH N/A 05/08/2015   Procedure: INSERTION OF MESH;  Surgeon: Axel Filler, MD;  Location: [facility] OR;  Service: General;  Laterality: N/A;  . LAPAROSCOPIC LYSIS OF ADHESIONS N/A 05/08/2015   Procedure: LAPAROSCOPIC LYSIS OF ADHESIONS;  Surgeon: Axel Filler, MD;  Location: [facility] OR;  Service: General;  Laterality: N/A;  . MULTIPLE TOOTH EXTRACTIONS  ~ 2003  . TONSILLECTOMY    . UMBILICAL HERNIA REPAIR  1973  . VENTRAL HERNIA REPAIR N/A 03/28/2014   Procedure: LAPAROSCOPIC VENTRAL HERNIA;  Surgeon: Axel Filler, MD;  Location: [facility] OR;  Service: General;  Laterality: N/A;  . VENTRAL HERNIA REPAIR N/A 05/08/2015   Procedure: LAPAROSCOPIC VENTRAL HERNIA REPAIR WITH MESH;  Surgeon: Axel Filler, MD;  Location: [facility] OR;  Service: General;  Laterality: N/A;    SOCIAL HISTORY: Social History   Tobacco Use  . Smoking status: Former Smoker    Packs/day: 0.25    Years: 26.00    Pack years: 6.50    Types: Cigarettes  . Smokeless tobacco: Never Used  Substance Use Topics  . Alcohol use: Yes    Comment: "have a drink a  few times/year"  . Drug use: No    FAMILY HISTORY: Family History  Problem Relation Age of Onset  . Diabetes Mother   . High Cholesterol Mother   . Obesity Mother   . Diabetes Father   . Cancer Father        Prostate cancer  . Cancer Sister        Cervical cancer  . Diabetes Brother   . Bipolar disorder Brother   . Alcohol abuse Brother   . Drug abuse Brother   . Diabetes Sister   . Diabetes Brother     ROS: Review of Systems  Constitutional: Positive for malaise/fatigue and weight loss.  Respiratory: Negative for shortness of breath.   Cardiovascular: Negative for chest pain.  Gastrointestinal: Negative for nausea and vomiting.  Musculoskeletal: Negative for myalgias.  Neurological: Negative for weakness.       Positive for paresthesias in fingers.  Endo/Heme/Allergies: Negative for polydipsia.       Negative for hypoglycemia. Positive for polyphagia/sweets cravings.  Psychiatric/Behavioral: Positive for depression. Negative for suicidal ideas.       Negative for homicidal ideation.    PHYSICAL EXAM: Blood pressure 125/63, pulse 73, temperature 97.9 F (36.6 C), temperature source Oral, height 5\' 2"  (1.575 m), weight 253 lb (114.8 kg), SpO2 99 %. Body mass index is 46.27 kg/m. Physical Exam  Constitutional: She is oriented to person, place, and time. She appears well-developed and well-nourished.  Cardiovascular: Normal rate.  Pulmonary/Chest: Effort normal.  Musculoskeletal: Normal range of motion.  Neurological: She is alert and oriented to person, place, and time.  Skin: Skin is warm and dry.  Psychiatric: She has a normal mood and affect. Her behavior is normal.  Vitals reviewed.   RECENT LABS AND TESTS: BMET    Component Value Date/Time   NA 141 10/07/2017 1146   K 5.0 10/07/2017 1146   CL 105 10/07/2017 1146   CO2 23 10/07/2017 1146   GLUCOSE 124 (H) 10/07/2017 1146   GLUCOSE 96 09/09/2017 1430  BUN 12 10/07/2017 1146   CREATININE 1.28 (H)  10/07/2017 1146   CREATININE 1.50 (H) 09/09/2017 1430   CALCIUM 10.0 10/07/2017 1146   GFRNONAA 49 (L) 10/07/2017 1146   GFRNONAA 40 (L) 09/09/2017 1430   GFRAA 56 (L) 10/07/2017 1146   GFRAA 47 (L) 09/09/2017 1430   Lab Results  Component Value Date   HGBA1C 7.6 (A) 03/08/2018   HGBA1C 7.7 (H) 10/07/2017   HGBA1C 8.4 07/12/2017   HGBA1C 7.1 04/07/2017   HGBA1C 7.8 01/22/2017   Lab Results  Component Value Date   INSULIN 24.5 10/07/2017   CBC    Component Value Date/Time   WBC 5.0 10/07/2017 1146   WBC 4.7 11/26/2016 1134   RBC 5.46 (H) 10/07/2017 1146   RBC 5.60 (H) 11/26/2016 1134   HGB 12.8 10/07/2017 1146   HCT 39.3 10/27/2017 1128   PLT 288 11/26/2016 1134   MCV 72 (L) 10/07/2017 1146   MCH 23.4 (L) 10/07/2017 1146   MCH 23.2 (L) 11/26/2016 1134   MCHC 32.4 10/07/2017 1146   MCHC 31.5 (L) 11/26/2016 1134   RDW 16.5 (H) 10/07/2017 1146   LYMPHSABS 2.8 10/07/2017 1146   MONOABS 300 08/19/2016 1401   EOSABS 0.2 10/07/2017 1146   BASOSABS 0.1 10/07/2017 1146   Iron/TIBC/Ferritin/ %Sat    Component Value Date/Time   IRON 76 10/27/2017 1128   TIBC 375 10/27/2017 1128   FERRITIN 48 10/27/2017 1128   IRONPCTSAT 20 10/27/2017 1128   Lipid Panel     Component Value Date/Time   CHOL 180 10/07/2017 1146   TRIG 183 (H) 10/07/2017 1146   HDL 45 10/07/2017 1146   CHOLHDL 4 09/09/2017 1430   VLDL 41.4 (H) 09/09/2017 1430   LDLCALC 98 10/07/2017 1146   LDLDIRECT 103.0 09/09/2017 1430   Hepatic Function Panel     Component Value Date/Time   PROT 7.2 10/07/2017 1146   ALBUMIN 4.5 10/07/2017 1146   AST 11 10/07/2017 1146   ALT 13 10/07/2017 1146   ALKPHOS 73 10/07/2017 1146   BILITOT <0.2 10/07/2017 1146      Component Value Date/Time   TSH 1.230 10/07/2017 1146   TSH 1.36 11/26/2016 1134   TSH 0.860 02/08/2015 0945    ASSESSMENT AND PLAN: B12 nutritional deficiency - Plan: Vitamin B12  Type 2 diabetes mellitus with stage 3 chronic kidney disease, with  long-term current use of insulin (HCC) - Plan: Microalbumin / creatinine urine ratio, Hemoglobin A1c, Insulin, random, Comprehensive metabolic panel  Vitamin D deficiency - Plan: VITAMIN D 25 Hydroxy (Vit-D Deficiency, Fractures)  Other hyperlipidemia - Plan: Lipid Panel With LDL/HDL Ratio  Other depression - with emotional eating  At risk for osteoporosis  Class 3 severe obesity with serious comorbidity and body mass index (BMI) of 45.0 to 49.9 in adult, unspecified obesity type (HCC)  PLAN:  B12 deficiency Ercel will work on increasing B12 rich foods in her diet. B12 supplementation was not prescribed today. We will recheck B12 level today.  Diabetes II Ayda has been given extensive diabetes education by myself today including ideal fasting and post-prandial blood glucose readings, individual ideal HgA1c goals  and hypoglycemia prevention. She will continue [place] and dulaglutide at current doses.  We will increase dose of Tresiba to 40 units per day and wait on lab results before making further changes. We will check fasting insulin, fasting glucose, urine microalbumin and HgbA1c today.   Myiah will follow up in 2 weeks.  Vitamin D Deficiency Kaytlynn was  informed that low vitamin D levels contributes to fatigue and are associated with obesity, breast, and colon cancer. She agrees to continue to take prescription Vit D @50 ,000 IU every week and will follow up for routine testing of vitamin D, at least 2-3 times per year. She was informed of the risk of over-replacement of vitamin D and agrees to not increase her dose unless she discusses this with [place] first.  At risk for osteopenia and osteoporosis Dorthie was given extended  (15 minutes) osteoporosis prevention counseling today. Alinah is at risk for osteopenia and osteoporosis due to her vitamin D deficiency. She was encouraged to take her vitamin D and follow her higher calcium diet and increase strengthening exercise to help  strengthen her bones and decrease her risk of osteopenia and osteoporosis.  Hyperlipidemia Anhar was informed of the American Heart Association Guidelines emphasizing intensive lifestyle modifications as the first line treatment for hyperlipidemia. We discussed many lifestyle modifications today in depth, and Ezmeralda will continue to work on decreasing saturated fats such as fatty red meat, butter and many fried foods. She will also increase vegetables and lean protein in her diet and continue to work on exercise and weight loss efforts.  Depression with Emotional Eating Behaviors We discussed behavior modification techniques today to help Katelin deal with her emotional eating and depression. She will continue to take Wellbutrin and sertraline as prescribed by her psychiatrist. She will continue intensive outpatient therapy (IOP). I encouraged her to have her counselors at IOP help her find a therapist for continued counseling after IOP concludes.  Obesity Ticia is currently in the action stage of change. As such, her goal is to continue with weight loss efforts She has agreed to follow the Category 2 plan plus 100 calories. Zoraida has been instructed to continue to walk for 30 minutes 3 times per week for weight loss and overall health benefits. We discussed the following Behavioral Modification Strategies today: increasing lean protein intake, no skipping meals, eating all of the prescribed food, and decreasing simple carbohydrates   Farha has agreed to follow up with our clinic in 2 weeks. She was informed of the importance of frequent follow up visits to maximize her success with intensive lifestyle modifications for her multiple health conditions.   OBESITY BEHAVIORAL INTERVENTION VISIT  Today's visit was # 7  Starting weight: 263 lbs Starting date: 10/07/2017 Today's weight : Weight: 253 lb (114.8 kg)  Today's date: 06/14/2018 Total lbs lost to date: 10   ASK: We discussed  the diagnosis of obesity with [name] today and Patsy Lager agreed to give [place] permission to discuss obesity behavioral modification therapy today.  ASSESS: Zadie has the diagnosis of obesity and her BMI today is 46.3. Keeley is in the action stage of change   ADVISE: Lakenzie was educated on the multiple health risks of obesity as well as the benefit of weight loss to improve her health. She was advised of the need for long term treatment and the importance of lifestyle modifications to improve her current health and to decrease her risk of future health problems.  AGREE: Multiple dietary modification options and treatment options were discussed and  Brendaliz agreed to follow the recommendations documented in the above note.  ARRANGE: Anette was educated on the importance of frequent visits to treat obesity as outlined per CMS and USPSTF guidelines and agreed to schedule her next follow up appointment today.

## 2018-06-15 ENCOUNTER — Other Ambulatory Visit (HOSPITAL_COMMUNITY): Payer: 59 | Admitting: Licensed Clinical Social Worker

## 2018-06-15 DIAGNOSIS — F329 Major depressive disorder, single episode, unspecified: Secondary | ICD-10-CM | POA: Diagnosis not present

## 2018-06-15 DIAGNOSIS — F331 Major depressive disorder, recurrent, moderate: Secondary | ICD-10-CM

## 2018-06-15 LAB — MICROALBUMIN / CREATININE URINE RATIO
Creatinine, Urine: 253.7 mg/dL
Microalb/Creat Ratio: 4.5 mg/g creat (ref 0.0–30.0)
Microalbumin, Urine: 11.5 ug/mL

## 2018-06-15 LAB — VITAMIN B12: Vitamin B-12: 264 pg/mL (ref 232–1245)

## 2018-06-15 LAB — LIPID PANEL WITH LDL/HDL RATIO
Cholesterol, Total: 174 mg/dL (ref 100–199)
HDL: 46 mg/dL (ref >39)
LDL Calculated: 103 mg/dL — ABNORMAL HIGH (ref 0–99)
LDl/HDL Ratio: 2.2 ratio (ref 0.0–3.2)
Triglycerides: 123 mg/dL (ref 0–149)
VLDL Cholesterol Cal: 25 mg/dL (ref 5–40)

## 2018-06-15 LAB — HEMOGLOBIN A1C
Est. average glucose Bld gHb Est-mCnc: 263 mg/dL
Hgb A1c MFr Bld: 10.8 % — ABNORMAL HIGH (ref 4.8–5.6)

## 2018-06-15 LAB — COMPREHENSIVE METABOLIC PANEL
ALT: 17 IU/L (ref 0–32)
AST: 14 IU/L (ref 0–40)
Albumin/Globulin Ratio: 1.8 (ref 1.2–2.2)
Albumin: 4.8 g/dL (ref 3.5–5.5)
Alkaline Phosphatase: 89 IU/L (ref 39–117)
BUN/Creatinine Ratio: 12 (ref 9–23)
BUN: 19 mg/dL (ref 6–24)
Bilirubin Total: 0.2 mg/dL (ref 0.0–1.2)
CO2: 21 mmol/L (ref 20–29)
Calcium: 10.2 mg/dL (ref 8.7–10.2)
Chloride: 101 mmol/L (ref 96–106)
Creatinine, Ser: 1.56 mg/dL — ABNORMAL HIGH (ref 0.57–1.00)
GFR calc Af Amer: 44 mL/min/{1.73_m2} — ABNORMAL LOW (ref >59)
GFR calc non Af Amer: 38 mL/min/{1.73_m2} — ABNORMAL LOW (ref >59)
Globulin, Total: 2.7 g/dL (ref 1.5–4.5)
Glucose: 179 mg/dL — ABNORMAL HIGH (ref 65–99)
Potassium: 5 mmol/L (ref 3.5–5.2)
Sodium: 138 mmol/L (ref 134–144)
Total Protein: 7.5 g/dL (ref 6.0–8.5)

## 2018-06-15 LAB — VITAMIN D 25 HYDROXY (VIT D DEFICIENCY, FRACTURES): Vit D, 25-Hydroxy: 14.8 ng/mL — ABNORMAL LOW (ref 30.0–100.0)

## 2018-06-15 LAB — INSULIN, RANDOM: INSULIN: 20.8 u[IU]/mL (ref 2.6–24.9)

## 2018-06-15 NOTE — Progress Notes (Signed)
    Daily Group Progress Note  Program: IOP  Group Time: 9am-12pm  Participation Level: Active  Behavioral Response: Appropriate and Blaming  Type of Therapy:  Group Therapy; process group, psycho-educational group  Summary of Progress:  The purpose of this group is to utilize CBT and DBT skills in a group setting to increase knowledge of and utilization of healthy coping skills to manager active mental health symptoms.  9am-10:30 am Clinician presented the topic of 'Feelings, Thoughts, and Mind Traps' from [place]. Clinician processed with group members different mind traps and role played how to challenge each type of thought. Clinician challenged group members to identify distorted thoughts, the feeling which came with the thought, and create an alternative thoughts. Clinician and group members reviewed steps for challenging mind traps, starting with identifying uncomfortable feeling and related thought and disrupting thoughts with positive self talk and alternative, helpful thoughts. Clinician praised clients on going open to new thoughts, even if they did not agree.  10:30am-12pm Clinician presented 'Roadblocks to Healthy Thinking: Ways of Thinking That Keep You Stuck." Clinician challenged group members to identify a time where they were caught in these, or others were caught in them during an interaction. Clinician and group members reviewed 'Developing New Thinking Habits' in response to distorted thinking. Clinician validated that skill is difficult and does not come overnight. Clinician validated clients stating work environment would not change making creating helpful thoughts difficult; clinician challenged clients to use a skill to make the moment manageable, not perfect and focus on asking 'how' instead of 'why' questions to problem solve rather than 'get stuck' in emotional mind. Clinician and group members practiced STOP skill and reviewed Tips for Breaking a Cycle. Client participated  in group discussions however struggled to identify feelings and was resistant to creating new thoughts. Client was receptive of feedback from other group members but reports she does not believe there will be much change at work or home with others' behaviors which she finds frustrating.   Harlon Ditty, LCSW

## 2018-06-16 ENCOUNTER — Other Ambulatory Visit (HOSPITAL_COMMUNITY): Payer: 59

## 2018-06-17 ENCOUNTER — Other Ambulatory Visit (HOSPITAL_COMMUNITY): Payer: 59 | Admitting: Licensed Clinical Social Worker

## 2018-06-17 DIAGNOSIS — F331 Major depressive disorder, recurrent, moderate: Secondary | ICD-10-CM

## 2018-06-17 DIAGNOSIS — F329 Major depressive disorder, single episode, unspecified: Secondary | ICD-10-CM | POA: Diagnosis not present

## 2018-06-20 ENCOUNTER — Other Ambulatory Visit (HOSPITAL_COMMUNITY): Payer: 59 | Admitting: Licensed Clinical Social Worker

## 2018-06-20 DIAGNOSIS — F331 Major depressive disorder, recurrent, moderate: Secondary | ICD-10-CM

## 2018-06-20 DIAGNOSIS — F329 Major depressive disorder, single episode, unspecified: Secondary | ICD-10-CM | POA: Diagnosis not present

## 2018-06-20 NOTE — Progress Notes (Signed)
    Daily Group Progress Note  Program: IOP  Group Time: 9am-12pm  Participation Level: Active  Behavioral Response: Appropriate  Type of Therapy:  Group Therapy; process group, psycho-educational group  Summary of Progress:  The purpose of group is to utilize CBT and DBT skills in a group setting to help clients increase healthy coping skills and decrease intensity of active mental health symptoms.  9:00 to 11:00 The clinician greeted members and inquired about symptoms and concerns, assessing for SI/HI/psychosis and overall level of functioning. The first part of group focused on, "Communication skills to get our needs met." Clinician facilitated the session by providing psycho-educational handout to clients with DBT skills including DEARMAN, GIVE, and FAST. Clinician and clients discussed when each skill could be used based on desired goal of the conversation. Clinician and clients explored feelings related to effective communications and interpersonal skills in different settings. Clinician validated clients thoughts and feelings that effective communication is difficult in a non-supportive environment. Clinicians coached clients to practiced articulating what they need effectively using listed skills. Clinician helped clients acknowledge their progress in being able to verbalize their needs effectively and praised clients implementation of previously learned skills. Clinician requested group members complete mindful movement exercise for stretching and focus. 11:00 12pm The second part involved a live video presentation via Zoom by a Medical Doctor on, " Mental Wellness: It's not all in your head." The presentation focused on providing psycho-education on how mental health symptoms can be effected by our diet. The doctor explained how the micro biome, which creates many neurotransmitters, plays an important role in the way we feel mentally and physically, and the importance of preventative  actions to maintain health including balanced diet. The doctor brought awareness of the importance of practicing healthy eating, exercise, relaxation/movement and balanced mindfulness with lifestyle choices to decrease stress and maintain balanced hormones and chemical levels in the body and brain. Client participated in all group discussions. Client was receptive to challenges from clinician however does not believe work is currently a healthy environment for her to try new skills.   Olegario Messier, LCSW

## 2018-06-20 NOTE — Progress Notes (Signed)
    Daily Group Progress Note  Program: IOP  Group Time: 9am-12pm  Participation Level: Active  Behavioral Response: Appropriate and Sharing  Type of Therapy:  Group Therapy; process group, psycho-educational group  Summary of Progress:  The purpose of this group is to utilize CBT and DBT skills in a group setting to increase use of healthy coping skills and decrease intensity of active mental health symptoms.  9am-10:30am Clinician checked in with group members, inquiring about recently used skills. Clinician presented the topic of slow, steady progress leading to positive results. Clinician processed with clients previous times of feeling uncomfortable feelings and how they have survived. Clinician focused on recent events which the clients identified as problematic and how they 'survived.' Clinician and group members processed how to find meaning in the struggle and see change as a growth process. Clinician provided clients with the 5-Senses activity, focused on grounding clients in the moment when feeling overwhelmed.  10:30am-12pm Clinician presented psycho-educational material on addressing ruminating thoughts using 'Catch It, Check It, Change It." Clinician reviewed the cognitive triangle and the connection between thoughts, emotions, and behaviors. Clinician praised clients for identifying several distorted thinking moments from the past several weeks and being able to identify alternative thoughts. Clinician challenged clients to focus on identifying if an unhelpful thought was a fact or an opinion, in order to help challenge and change a thought. Clinician and group members practiced identifying an event, emotion, automatic thought, and creating a rational response. Clinician and group members reviewed '7 Strategies how to get rid of your negative thoughts.' Clinician inquired about one thing clients are grateful for, and one activity of self care planned for the rest of the day. Client  participated in all group discussions and activities. Client was open to challenges from clinician and challenged other group members to evaluate their thoughts and how behaviors could have been different. Client plans to spend time with her granddaughter during the rest of the day.  Harlon Ditty, LCSW

## 2018-06-21 ENCOUNTER — Other Ambulatory Visit (HOSPITAL_COMMUNITY): Payer: 59

## 2018-06-22 ENCOUNTER — Other Ambulatory Visit (HOSPITAL_COMMUNITY): Payer: 59 | Admitting: Licensed Clinical Social Worker

## 2018-06-22 DIAGNOSIS — F331 Major depressive disorder, recurrent, moderate: Secondary | ICD-10-CM

## 2018-06-22 DIAGNOSIS — F329 Major depressive disorder, single episode, unspecified: Secondary | ICD-10-CM | POA: Diagnosis not present

## 2018-06-22 NOTE — Progress Notes (Signed)
    Daily Group Progress Note  Program: IOP  Group Time: 9am-12pm  Participation Level: Active  Behavioral Response: Appropriate, Sharing and Care-Taking  Type of Therapy:  Group Therapy; psycho-educational group, process group  Summary of Progress:  The purpose of this group is to utilize CBT and DBT skills in a group setting to increase knowledge of and use of healthy coping skills to decrease active mental health symptoms.  9am-11am Clinician checked in with group members assessing for levels of anxiety, depression, and needs. Clinician and group members completed 'My Mission: Celebrating Life and Love' worksheet. Clinician and group members processed hardest and easiest parts of activity to complete and why. Clinician presented the topic of building self-compassion including psycho-education related to self compassion cycle including using breathing and guided imaging to activate 'soothing system.' Clinician and clients processed self criticism vs self compassion. Clinician provided psycho-education related to each and allowed time for group members to discuss how either has impacted their life. Clinician facilitated discussion between group members related to clarifying expectation of family members and roles they play in our life. Clinicians provided time for clients to discussed holding onto emotions and what it would look like to let them go, including allowing forgiveness without receiving an apology.  11am-12pm Clinician and group members participated in self-esteem building activity, [place]. Activity focused on clients identifying strengths and how those can be paired with positive skills to maintain healthy mental fitness. Client participated in group discussions and activates. Client was supportive of group member, engaging in discussion about possibilities of an uncomfortable event and possible rational for behaviors. Clinician shared she has not let go of some emotions related to the  death of family members and has trouble offering forgiveness sometimes.  Harlon DittyKarissa A Kao Conry, LCSW

## 2018-06-23 ENCOUNTER — Other Ambulatory Visit (HOSPITAL_COMMUNITY): Payer: 59 | Admitting: Licensed Clinical Social Worker

## 2018-06-23 DIAGNOSIS — F329 Major depressive disorder, single episode, unspecified: Secondary | ICD-10-CM | POA: Diagnosis not present

## 2018-06-23 DIAGNOSIS — F331 Major depressive disorder, recurrent, moderate: Secondary | ICD-10-CM

## 2018-06-24 ENCOUNTER — Other Ambulatory Visit (HOSPITAL_COMMUNITY): Payer: 59 | Admitting: Licensed Clinical Social Worker

## 2018-06-24 DIAGNOSIS — F329 Major depressive disorder, single episode, unspecified: Secondary | ICD-10-CM | POA: Diagnosis not present

## 2018-06-24 DIAGNOSIS — F331 Major depressive disorder, recurrent, moderate: Secondary | ICD-10-CM

## 2018-06-25 ENCOUNTER — Encounter (INDEPENDENT_AMBULATORY_CARE_PROVIDER_SITE_OTHER): Payer: Self-pay | Admitting: Family Medicine

## 2018-06-27 ENCOUNTER — Other Ambulatory Visit (HOSPITAL_COMMUNITY): Payer: 59 | Admitting: Licensed Clinical Social Worker

## 2018-06-27 DIAGNOSIS — F329 Major depressive disorder, single episode, unspecified: Secondary | ICD-10-CM | POA: Diagnosis not present

## 2018-06-27 DIAGNOSIS — F331 Major depressive disorder, recurrent, moderate: Secondary | ICD-10-CM

## 2018-06-27 NOTE — Progress Notes (Signed)
    Daily Group Progress Note  Program: IOP  Group Time: 9am-12pm  Participation Level: Active  Behavioral Response: Appropriate  Type of Therapy:  Group Therapy; psycho-educational group, process group  Summary of Progress:  The purpose of this group is to utilize CBT and DBT skills in a group setting to increase use of healthy coping skills and decrease intensity of mental health symptoms. Clinician utilized active listening, open ended questions, and reflective statements to maintain client engagement. Clinician checked in with clients inquiring about self care activities and assessing levels of anxiety and depression.  9am-11am Clinician presented psycho-educational material on Communication Roadblocks from [facility] "Ideas for Better Communication"  Clinician asked clients to discuss communication difficulties they have encountered.  Clinician and group members processed what can be done to help overcome these types of roadblock? Clinician and group members problem solved scenarios provided by clients. Clinician and group members role played expressive need clearly, clarifying needs or wants for self or others. Clinician and group members practiced active listening and assertive communication of needs. Clinician and clients practiced using I-statements to communicate thoughts and feelings in order to get needs met. 11am-12pm  [place] presented information on community support groups, community classes, and peer support programs. Clinician presented the topic of "Being enough" and discussed what this means to each client. Clinician prompted group members to identify progress made by identifying skills used, and skills still needed or scenarios they are wanting to plan for. Clients identified communicating needs to friends and family without having to share all mental health symptoms without feeling 'guilty.' Client participated in group discussions. Client identified serval  personal roadblocks and how she currently addresses those. Client identifies a strength as wanting clear communication.  Olegario Messier, LCSW

## 2018-06-27 NOTE — Progress Notes (Signed)
    Daily Group Progress Note  Program: IOP  Group Time: 9am-12pm  Participation Level: Active  Behavioral Response: Appropriate  Type of Therapy:  Group Therapy; psycho-educational group, process group  Summary of Progress: 9am-10am Pharmacist met with group to provide psycho-education related to medications and side effects. Clients were allowed time to ask questions and engage in discussions with any questions or concerns medication and symptom related.  10am-12pm Clinician presented topic of Mindfulness and Difficult Emotions. Clinician provided skills from [facility] on coping with difficult emotions including accepting and labeling emotions, identifying triggers, and responding rather than reacting. Clinician and group members practiced using assertive communication and I-statements to get needs met at work to help manage levels of anxiety. Clinician and group members practiced using positive self talk and gratitude to counteract automatic negative thoughts. Clinician provided Mindfulness handouts focused on coping with feeling overwhelmed and unsure. Clinician praised group members processing of when skills would be useful and how they can be adapted to fit individual client needs. Client participated in group discussions and activties. Client was supportive of other group members and reports wanting to engage in becoming a peer support still. Client verbalized ongoing anxiety about returning to work, which is why she is looking at peer support specialist. Client struggled to create positive self talk or create alternatives to automatic negative thoughts in relation to her marriage and interactions with her husband. Client verbalized understanding the skill of creating alternative thoughts which lead to alternative feelings.  Olegario Messier, LCSW

## 2018-06-27 NOTE — Progress Notes (Signed)
    Daily Group Progress Note  Program: IOP  Group Time: 9am-12pm  Participation Level: Active  Behavioral Response: Appropriate, Care-Taking and Motivated  Type of Therapy:  Group Therapy; process group, pscyho-educational group  Summary of Progress:  The purpose of this group is to utilize CBT and DBT skills in a group setting to increase utilization of healthy coping skills and decrease intensity of active mental health symptoms.  9am-10:30am Clinician presented the topic of Stress-Monitorying as effective CBT technique. Clinician reminded clients of the coneection of thoughts, emotions, and bheaviors. Clinician shared that stress management is a compondnet for relapse prevention which includes anticipating triggers, preparing alternative bheaviors, utilizing mindfulness and coping skills. Clinician reviewed with group members 'The Stress Thermometer.' Clinician and group members discussed what different levels of stress looked like for each client, including behaviors and body sensations.  10:30am-12pm Clinician and clients practiced skill building with 'Techniques for Lowering Stress.' Clinician reminded clients of the importance of practicing skills regularly, even when not feeling uncomfortable emotions to build mastery of skills. Clinician and group members practiced 5-7-9 breathing, body scanning, progressive muscle relaxation, guided imagery, and discussed creating gratitude lists as part of journaling. Client participated fully in group discussions and activities. Client was able to help group members create alternative, realistic thoughts to distressing situations and remained supportive of other group members attempts at assertive communication. Client reports looking into peer support as an option for a new job path as she enjoys helping others based on her life experiences.  Harlon DittyKarissa A Jeremy Mclamb, LCSW

## 2018-06-27 NOTE — Telephone Encounter (Signed)
Please address, thx!

## 2018-06-28 ENCOUNTER — Encounter (HOSPITAL_COMMUNITY): Payer: Self-pay | Admitting: Psychiatry

## 2018-06-28 ENCOUNTER — Other Ambulatory Visit (HOSPITAL_COMMUNITY): Payer: 59 | Admitting: Licensed Clinical Social Worker

## 2018-06-28 ENCOUNTER — Encounter (INDEPENDENT_AMBULATORY_CARE_PROVIDER_SITE_OTHER): Payer: Self-pay | Admitting: Family Medicine

## 2018-06-28 ENCOUNTER — Ambulatory Visit (INDEPENDENT_AMBULATORY_CARE_PROVIDER_SITE_OTHER): Payer: 59 | Admitting: Family Medicine

## 2018-06-28 ENCOUNTER — Encounter (HOSPITAL_COMMUNITY): Payer: Self-pay | Admitting: Family

## 2018-06-28 VITALS — BP 120/77 | HR 87 | Temp 97.9°F | Ht 62.0 in | Wt 260.0 lb

## 2018-06-28 DIAGNOSIS — E559 Vitamin D deficiency, unspecified: Secondary | ICD-10-CM | POA: Diagnosis not present

## 2018-06-28 DIAGNOSIS — E1122 Type 2 diabetes mellitus with diabetic chronic kidney disease: Secondary | ICD-10-CM | POA: Diagnosis not present

## 2018-06-28 DIAGNOSIS — F331 Major depressive disorder, recurrent, moderate: Secondary | ICD-10-CM

## 2018-06-28 DIAGNOSIS — Z794 Long term (current) use of insulin: Secondary | ICD-10-CM

## 2018-06-28 DIAGNOSIS — F3289 Other specified depressive episodes: Secondary | ICD-10-CM

## 2018-06-28 DIAGNOSIS — Z9189 Other specified personal risk factors, not elsewhere classified: Secondary | ICD-10-CM | POA: Diagnosis not present

## 2018-06-28 DIAGNOSIS — N183 Chronic kidney disease, stage 3 (moderate): Secondary | ICD-10-CM

## 2018-06-28 DIAGNOSIS — Z6841 Body Mass Index (BMI) 40.0 and over, adult: Secondary | ICD-10-CM

## 2018-06-28 DIAGNOSIS — F329 Major depressive disorder, single episode, unspecified: Secondary | ICD-10-CM | POA: Diagnosis not present

## 2018-06-28 MED ORDER — BLOOD GLUCOSE METER KIT: PACK | 0 refills | Status: DC

## 2018-06-28 NOTE — Progress Notes (Signed)
[name] is a 52 y.o. ,married, employed, [place], who was a self referral; treatment for worsening depressive and anxiety symptoms.  Pt is well known to this writer due to prior admits in IOP (February 2019 and October 2018).  Denies HI or A/V hallucinations. Hx of depression, sixteen yrs ago whenever her father passed. Deniedany past psychiatric hospitalizations or suicide attempts or gestures.  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 According to pt, things has become worse since getting a Writernew manager in September 2018. Pt states that her manager is a bully.According to pt, she has been offered two jobs Administrator([employer] and [employer]) since last attending IOP.  "I turned [employer] down due to long training in which I would have to be out of town; then [employer] turned me down after they read about poor performance goals that [employer] had listed.  I had taken the urine drug test and everything.  I was just waiting on my start date."  2) Medical: Pt has Diabetes (Dx three yrs ago). Pt states her glucose levels has been running very high (400 plus).  States she has gained weight.  3) Husband of 19 yrs: Pt's husband has been dx'd with cancer (from the neck up).  Chemo was just recently stopped due to it taking a toll on his body according to pt.  Pt states since chemo he has been verbally abusive.  He is blind in one eye; walks with a cane.  "He's very moody and disrespectful to me."  Pt states she has had to CNA's to come in to assist with him and they quit because of his irritability.   Family Hx: (Brother) Bipolar D/O, ETOH and drugs. Pt completed MH-IOP today.  States she's not feeling any better since starting MH-IOP.  Incoming score on the depression checklist was 40; scored 39 today.  Reports if she didn't have the stressors of husband's illness and recently finding out daughter is pregnant; she would feel much better.  Pt states she is  planning to get away for a weekend to try and recharge herself.  Denies any SI/HI or A/V hallucinations.  A:  D/C today.  F/U with Dr. Gilmore LarocheAkhtar on 07-18-18 and Fulton ReekKarissa Brone, LCSW on 07-11-18 @ 4pm.  RTW on 07-17-18.  Encouraged support groups.  Strongly recommended [facility] with Idalia NeedleBeth MacKenzie, LCAS.  R:  Pt receptive.       Chestine SporeLARK, [name], M.Ed,CNA

## 2018-06-28 NOTE — Progress Notes (Signed)
    Daily Group Progress Note  Program: IOP  Group Time: 9am-12pm  Participation Level: Active  Behavioral Response: Appropriate  Type of Therapy:  Group Therapy; Psychoeducational group, process group  Summary of Progress:  The purpose of this group is to utilize CBT and DBT skills in a group setting to increase use of healthy coping skills and decrease active mental health symptoms.  9am-10:30am Clinician presented DBT Distress Tolerance Skills ACCEPTS. Clinician and group members discussed the importance with the ACCEPT skill to be mindful of only using distraction skills to lessen intensity of emotion, not numb the emotion and return to isolating.  10:30am-12pm Clinician presented DBT Distress Tolerance Skill TIPP, focused on changing body chemistry to reduce extreme emotions quickly. Clinician provided psycho-educational information to clients related to DIVE reflex. Clinician facilitated mindful stretching exercise and mindful breathing exercise by providing clients with 'Mindful Moments' cards and requesting group members learn and teach new skills to each other.  Clinician requested group members share self care activity planned for the rest of the day. Client expresses continued anxiety related to returning to work and reports this has not lessened since start of program. Client is able to demonstrate and verbalize skills she can use such as box breathing however does not believe these skills will be effective in her current work environment. Client is considering becoming a peer support specialist rather than returning to work.  Harlon DittyKarissa A Tukker Byrns, LCSW

## 2018-06-28 NOTE — Patient Instructions (Signed)
D:  Patient completed MH-IOP today.  A:  Discharge today.  Follow up with Dr. Gilmore LarocheAkhtar on 07-18-18 @ 10 a.m and Fulton ReekKarissa Brone, LCSW on 07-11-18 @ 4 pm.  Encouraged support groups.  Recommended [facility] with Idalia NeedleBeth MacKenzie, LCAS.  RTW on 07-17-18.  R:  Patient receptive.

## 2018-06-28 NOTE — Progress Notes (Signed)
  [facility] Discharge Summary  Jasmine CardYolanda E Pita [id number]  Admission date: 06/07/2018 Discharge date: 06/18/2018  Reason for admission: Depression  Per admission assessment note: Rowe Jasmine [name] [name] 52 year old African-American female presents with worsening depression related to multiple stressors.  Continues to report she is employed by [employer] as she reports she is attempted to look for other options of employment without success.  Reports recent passing of a stepbrother and states her employer did not excuse her absences to attend her brothers funeral.  [name] reports her husband is currently battling cancer and she reports his mood has been irritable and "edgy". Reports she is under a lot pressure and stress related to his healthcare needs. States she feels like she is experiencing caregiver burnout.  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  She provides transportation for her husband to and from doctor's appointments.  The patient is currently followed by psychiatrist Gilmore LarocheAkhtar and is actively seeking a therapist at this time.   Chemical Use History: was denied   Family of Origin Issues: Caregiver strain as she reports she continues to be the main provider and caregiver for her husband.   Progress in Program Toward Treatment Goals: Ongoing patient attended and participated with daily group session.  Patient continues to express depression and depressive symptoms. The reports she may consider inpatient admission.  As she reports she just needs to get away and rest for a while.  Denies suicidal or homicidal ideations.  Continues to express 10 out of 10 depression with 10 being the worst.  Unable to identify additional coping skills learned during intensive outpatient program.  Patient continues to express anxiety related to returning back to [employer]. Nylah to start therapy  sessions after discharge.  Support encouragement reassurance was provided.  Progress (rationale): Keep follow-up with Dr. Gilmore LarocheAkhtar and Fulton ReekKarissa Brone, LCSW 07/11/2018 @ 1600  Take all medications as prescribed. Keep all follow-up appointments as scheduled.  Do not consume alcohol or use illegal drugs while on prescription medications. Report any adverse effects from your medications to your primary care provider promptly.  In the event of recurrent symptoms or worsening symptoms, call 911, a crisis hotline, or go to the nearest emergency department for evaluation.     Chestine SporeCLARK, Jasmine 06/28/2018

## 2018-06-29 ENCOUNTER — Other Ambulatory Visit (HOSPITAL_COMMUNITY): Payer: 59

## 2018-06-30 ENCOUNTER — Other Ambulatory Visit (HOSPITAL_COMMUNITY): Payer: 59

## 2018-07-01 ENCOUNTER — Other Ambulatory Visit (HOSPITAL_COMMUNITY): Payer: 59

## 2018-07-04 ENCOUNTER — Encounter (INDEPENDENT_AMBULATORY_CARE_PROVIDER_SITE_OTHER): Payer: Self-pay | Admitting: Family Medicine

## 2018-07-04 ENCOUNTER — Other Ambulatory Visit (HOSPITAL_COMMUNITY): Payer: 59

## 2018-07-04 ENCOUNTER — Ambulatory Visit (HOSPITAL_COMMUNITY): Payer: 59 | Admitting: Licensed Clinical Social Worker

## 2018-07-04 NOTE — Progress Notes (Signed)
Office: [phone]  /  Fax: [phone]   HPI:   Chief Complaint: OBESITY Jasmine [name] is here to discuss her progress with her obesity treatment plan. She is on the Category 2 plan + 100 calories and is following her eating plan approximately 45 % of the time. She states she is exercising 0 minutes 0 times per week. Jasmine [name] is currently struggling with severe sweet cravings at night. Halloween candy is out of the house now. She is eating all of her protein.  Her weight is 260 lb (117.9 kg) today and has had a weight gain of 7 pounds over a period of 2 weeks since her last visit. She has lost 3 lbs since starting treatment with [place].  Diabetes II Jasmine [name] has a diagnosis of diabetes type II. Jasmine [name] states that her fasting BG's range between 160 and 480 and her 2 hour post prandial sugars are 175 to 377 and is uncontrolled. She is taking lantus, Tresiba 35 units, and bupropion 393m. Her Jasmine [name] increased to 40 units at her last visit. She is not taking Jasmine [name]. She was taking 4 times a week due to excessive urination. Last A1c was 10.8 on 06/14/18. She denies any hypoglycemia. She has been working on intensive lifestyle modifications including diet, exercise, and weight loss to help control her blood glucose levels.  Depression with emotional eating behaviors Jasmine [name] struggling with emotional eating and using food for comfort to the extent that it is negatively impacting her health. She often snacks when she is not hungry. [name] sometimes feels she is out of control and then feels guilty that she made poor food choices. She has been working on behavior modification techniques to help reduce her emotional eating and has been somewhat successful. She is completing intensive outpatient therapy today and has an appointment with her counselor 07/11/18. She notes an increase in stress eating.  Vitamin D deficiency Jasmine [name] a diagnosis of vitamin D deficiency. She is currently taking vit D,  but is not at goal. She denies nausea, vomiting, or muscle weakness.  At risk for osteopenia and osteoporosis Jasmine [name] at higher risk of osteopenia and osteoporosis due to vitamin D deficiency.   ALLERGIES: Allergies  Allergen Reactions  . Eggs Or Egg-Derived Products Hives and Other (See Comments)    FLU VACCINE: caused hives    . Influenza Vaccines Hives    Vaccine from Egg derived product causes Hives  . Effexor [Venlafaxine]     "felt weird"  . Metformin And Related Swelling    ANGIOEDEMA    MEDICATIONS: Current Outpatient Medications on File Prior to Visit  Medication Sig Dispense Refill  . buPROPion (WELLBUTRIN XL) 300 MG 24 hr tablet TAKE 1 TABLET BY MOUTH EVERY DAY 90 tablet 0  . dapagliflozin propanediol (FARXIGA) 10 MG TABS tablet Take 10 mg by mouth daily. 90 tablet 1  . Dulaglutide (TRULICITY) 1.5 MHL/4.5GYSOPN Inject 1.5 mg into the skin once a week. 12 pen 1  . gabapentin (NEURONTIN) 100 MG capsule Take 1 capsule (100 mg total) by mouth 2 (two) times daily. 180 capsule 0  . insulin degludec (TRESIBA FLEXTOUCH) 100 UNIT/ML SOPN FlexTouch Pen Inject 0.35 mLs (35 Units total) into the skin at bedtime. Inject 35 units every pm. (Patient taking differently: Inject 40 Units into the skin at bedtime. Inject 35 units every pm.) 5 pen 0  . olmesartan-hydrochlorothiazide (BENICAR HCT) 20-12.5 MG tablet Take 1 tablet by mouth daily. 90 tablet 1  . sertraline (ZOLOFT)  100 MG tablet Take one and one/half tablet daily. 135 tablet 0  . simvastatin (ZOCOR) 10 MG tablet Take 1 tablet (10 mg total) by mouth daily. 90 tablet 4  . Topiramate ER (TROKENDI XR) 50 MG CP24 Take 1 tablet by mouth daily. 90 capsule 1  . Vitamin D, Ergocalciferol, (DRISDOL) 50000 units CAPS capsule Take 1 capsule (50,000 Units total) by mouth every 7 (seven) days. 4 capsule 0  . [DISCONTINUED] DULoxetine (CYMBALTA) 20 MG capsule Take 1 capsule (20 mg total) by mouth daily. 90 capsule 1   No current  facility-administered medications on file prior to visit.     PAST MEDICAL HISTORY: Past Medical History:  Diagnosis Date  . Anxiety   . Arthritis    "knees" (03/28/2014)  . Chronic renal insufficiency    stage II (mild)  . Depression   . Headache    with high blood sugar  . Hypercholesteremia   . Hypertension   . Numbness and tingling of right arm and leg   . Seasonal allergies   . Type II diabetes mellitus ([place])   . Umbilical hernia without mention of obstruction or gangrene   . Wears glasses     PAST SURGICAL HISTORY: Past Surgical History:  Procedure Laterality Date  . INSERTION OF MESH N/A 03/28/2014   Procedure: INSERTION OF MESH;  Surgeon: Ralene Ok, MD;  Location: [place];  Service: General;  Laterality: N/A;  . INSERTION OF MESH N/A 05/08/2015   Procedure: INSERTION OF MESH;  Surgeon: Ralene Ok, MD;  Location: [place];  Service: General;  Laterality: N/A;  . LAPAROSCOPIC LYSIS OF ADHESIONS N/A 05/08/2015   Procedure: LAPAROSCOPIC LYSIS OF ADHESIONS;  Surgeon: Ralene Ok, MD;  Location: [place];  Service: General;  Laterality: N/A;  . MULTIPLE TOOTH EXTRACTIONS  ~ 2003  . TONSILLECTOMY    . [place]  . VENTRAL HERNIA REPAIR N/A 03/28/2014   Procedure: LAPAROSCOPIC VENTRAL HERNIA;  Surgeon: Ralene Ok, MD;  Location: [place];  Service: General;  Laterality: N/A;  . VENTRAL HERNIA REPAIR N/A 05/08/2015   Procedure: LAPAROSCOPIC VENTRAL HERNIA REPAIR WITH MESH;  Surgeon: Ralene Ok, MD;  Location: [place];  Service: General;  Laterality: N/A;    SOCIAL HISTORY: Social History   Tobacco Use  . Smoking status: Former Smoker    Packs/day: 0.25    Years: 26.00    Pack years: 6.50    Types: Cigarettes  . Smokeless tobacco: Never Used  Substance Use Topics  . Alcohol use: Yes    Comment: "have a drink a few times/year"  . Drug use: No    FAMILY HISTORY: Family History  Problem Relation Age of Onset  . Diabetes Mother   . High  Cholesterol Mother   . Obesity Mother   . Diabetes Father   . Cancer Father        Prostate cancer  . Cancer Sister        Cervical cancer  . Diabetes Brother   . Bipolar disorder Brother   . Alcohol abuse Brother   . Drug abuse Brother   . Diabetes Sister   . Diabetes Brother     ROS: Review of Systems  Constitutional: Negative for weight loss.  Gastrointestinal: Negative for nausea and vomiting.  Musculoskeletal:       Negative for muscle weakness.  Endo/Heme/Allergies:       Negative for hypoglycemia.  Psychiatric/Behavioral: Positive for depression.    PHYSICAL EXAM: Blood pressure 120/77,  pulse 87, temperature 97.9 F (36.6 C), temperature source Oral, height 5' 2"  (1.575 m), weight 260 lb (117.9 kg), SpO2 97 %. Body mass index is 47.55 kg/m. Physical Exam  Constitutional: She is oriented to person, place, and time. She appears well-developed and well-nourished.  Cardiovascular: Normal rate.  Pulmonary/Chest: Effort normal.  Musculoskeletal: Normal range of motion.  Neurological: She is oriented to person, place, and time.  Skin: Skin is warm and dry.  Psychiatric: She has a normal mood and affect. Her behavior is normal.  Vitals reviewed.   RECENT LABS AND TESTS: BMET    Component Value Date/Time   NA 138 06/14/2018 1101   K 5.0 06/14/2018 1101   CL 101 06/14/2018 1101   CO2 21 06/14/2018 1101   GLUCOSE 179 (H) 06/14/2018 1101   GLUCOSE 96 09/09/2017 1430   BUN 19 06/14/2018 1101   CREATININE 1.56 (H) 06/14/2018 1101   CREATININE 1.50 (H) 09/09/2017 1430   CALCIUM 10.2 06/14/2018 1101   GFRNONAA 38 (L) 06/14/2018 1101   GFRNONAA 40 (L) 09/09/2017 1430   GFRAA 44 (L) 06/14/2018 1101   GFRAA 47 (L) 09/09/2017 1430   Lab Results  Component Value Date   HGBA1C 10.8 (H) 06/14/2018   HGBA1C 7.6 (A) 03/08/2018   HGBA1C 7.7 (H) 10/07/2017   HGBA1C 8.4 07/12/2017   HGBA1C 7.1 04/07/2017   Lab Results  Component Value Date   INSULIN 20.8 06/14/2018     INSULIN 24.5 10/07/2017   CBC    Component Value Date/Time   WBC 5.0 10/07/2017 1146   WBC 4.7 11/26/2016 1134   RBC 5.46 (H) 10/07/2017 1146   RBC 5.60 (H) 11/26/2016 1134   HGB 12.8 10/07/2017 1146   HCT 39.3 10/27/2017 1128   PLT 288 11/26/2016 1134   MCV 72 (L) 10/07/2017 1146   MCH 23.4 (L) 10/07/2017 1146   MCH 23.2 (L) 11/26/2016 1134   MCHC 32.4 10/07/2017 1146   MCHC 31.5 (L) 11/26/2016 1134   RDW 16.5 (H) 10/07/2017 1146   LYMPHSABS 2.8 10/07/2017 1146   MONOABS 300 08/19/2016 1401   EOSABS 0.2 10/07/2017 1146   BASOSABS 0.1 10/07/2017 1146   Iron/TIBC/Ferritin/ %Sat    Component Value Date/Time   IRON 76 10/27/2017 1128   TIBC 375 10/27/2017 1128   FERRITIN 48 10/27/2017 1128   IRONPCTSAT 20 10/27/2017 1128   Lipid Panel     Component Value Date/Time   CHOL 174 06/14/2018 1101   TRIG 123 06/14/2018 1101   HDL 46 06/14/2018 1101   CHOLHDL 4 09/09/2017 1430   VLDL 41.4 (H) 09/09/2017 1430   LDLCALC 103 (H) 06/14/2018 1101   LDLDIRECT 103.0 09/09/2017 1430   Hepatic Function Panel     Component Value Date/Time   PROT 7.5 06/14/2018 1101   ALBUMIN 4.8 06/14/2018 1101   AST 14 06/14/2018 1101   ALT 17 06/14/2018 1101   ALKPHOS 89 06/14/2018 1101   BILITOT 0.2 06/14/2018 1101      Component Value Date/Time   TSH 1.230 10/07/2017 1146   TSH 1.36 11/26/2016 1134   TSH 0.860 02/08/2015 0945   Results for SARABI, SOCKWELL (MRN [id number]) as of 07/04/2018 14:53  Ref. Range 06/14/2018 11:01  Vitamin D, 25-Hydroxy Latest Ref Range: 30.0 - 100.0 ng/mL 14.8 (L)   ASSESSMENT AND PLAN: Type 2 diabetes mellitus with stage 3 chronic kidney disease, with long-term current use of insulin (HCC) - Plan: blood glucose meter kit and supplies  Vitamin D deficiency  Other depression - with emotional eating   At risk for osteoporosis  Class 3 severe obesity with serious comorbidity and body mass index (BMI) of 45.0 to 49.9 in adult, unspecified obesity type  ([place])  PLAN:  Diabetes II Jena has been given extensive diabetes education by myself today including ideal fasting and post-prandial blood glucose readings, individual ideal Hgb A1c goals, and hypoglycemia prevention. We discussed the importance of good blood sugar control to decrease the likelihood of diabetic complications such as nephropathy, neuropathy, limb loss, blindness, coronary artery disease, and death. We discussed the importance of intensive lifestyle modification including diet, exercise and weight loss as the first line treatment for diabetes. Jasmine [name] agrees to continue her diabetes medications and increase Tresiba to 50 units daily. She was encouraged compliance with [place]. A glucose meter, strips, and lancets were sent to the pharmacy per her insurance coverage. Casy agrees to make an appointment to see Dr. Renne Crigler, her endocrinologist. Her last appointment was over 1 year ago. Jasmine [name] agrees that she will follow up at the agreed upon time.  Vitamin D Deficiency Jasmine [name] was informed that low vitamin D levels contributes to fatigue and are associated with obesity, breast, and colon cancer. She agrees to continue to take prescription Vit D @50 ,000 IU weekly #4 with no refills and will follow up for routine testing of vitamin D, at least 2-3 times per year. She was informed of the risk of over-replacement of vitamin D and agrees to not increase her dose unless she discusses this with [place] first. Jasmine [name] agrees to follow up as directed.  At risk for osteopenia and osteoporosis Jasmine [name] was given extended (15 minutes) osteoporosis prevention counseling today. Jasmine [name] is at risk for osteopenia and osteoporosis due to her vitamin D deficiency. She was encouraged to take her vitamin D and follow her higher calcium diet and increase strengthening exercise to help strengthen her bones and decrease her risk of osteopenia and osteoporosis.  Depression with Emotional Eating Behaviors We  discussed behavior modification techniques today to help Jasmine [name] deal with her emotional eating and depression. She has agreed to continue to take bupropion XL 300 mg qd, Zoloft 14m, and continue with counseling. Jasmine [name] agreed to follow up as directed.  Obesity YSheltonis currently in the action stage of change. As such, her goal is to continue with weight loss efforts. She has agreed to follow the Category 2 plan +100 calories. YYvonehas not been prescribed exercise at this time.  We discussed the following Behavioral Modification Strategies today: increasing lean protein intake, holiday eating strategies, better snacking choices, and decrease liquid calories.  YMinalhas agreed to follow up with our clinic in 2 weeks. She was informed of the importance of frequent follow up visits to maximize her success with intensive lifestyle modifications for her multiple health conditions.   OBESITY BEHAVIORAL INTERVENTION VISIT  Today's visit was # 8   Starting weight: 263 lbs Starting date: 10/07/17 Today's weight : Weight: 260 lb (117.9 kg)  Today's date: 06/28/2018 Total lbs lost to date: 3  ASK: We discussed the diagnosis of obesity with Jasmine Maximtoday and Jasmine Georgeagreed to give uKoreapermission to discuss obesity behavioral modification therapy today.  ASSESS: YSophinahas the diagnosis of obesity and her BMI today is 47.54. YKikuyeis in the action stage of change.   ADVISE: YKeaishawas educated on the multiple health risks of obesity as well as the benefit of weight loss to improve her health. She was advised of  the need for long term treatment and the importance of lifestyle modifications to improve her current health and to decrease her risk of future health problems.  AGREE: Multiple dietary modification options and treatment options were discussed and Yarlin agreed to follow the recommendations documented in the above note.  ARRANGE: Nickcole was educated on the  importance of frequent visits to treat obesity as outlined per CMS and USPSTF guidelines and agreed to schedule her next follow up appointment today.  Lenward Chancellor, am acting as Location manager for Georgianne Fick, FNP.  I have reviewed the above documentation for accuracy and completeness, and I agree with the above.  - Brittany Amirault, FNP-C.

## 2018-07-05 ENCOUNTER — Other Ambulatory Visit (HOSPITAL_COMMUNITY): Payer: 59

## 2018-07-06 ENCOUNTER — Other Ambulatory Visit (HOSPITAL_COMMUNITY): Payer: 59

## 2018-07-08 ENCOUNTER — Other Ambulatory Visit (HOSPITAL_COMMUNITY): Payer: 59

## 2018-07-11 ENCOUNTER — Ambulatory Visit (INDEPENDENT_AMBULATORY_CARE_PROVIDER_SITE_OTHER): Payer: PRIVATE HEALTH INSURANCE | Admitting: Licensed Clinical Social Worker

## 2018-07-11 ENCOUNTER — Other Ambulatory Visit (HOSPITAL_COMMUNITY): Payer: 59

## 2018-07-11 DIAGNOSIS — F331 Major depressive disorder, recurrent, moderate: Secondary | ICD-10-CM

## 2018-07-12 ENCOUNTER — Other Ambulatory Visit (HOSPITAL_COMMUNITY): Payer: 59

## 2018-07-12 ENCOUNTER — Encounter (INDEPENDENT_AMBULATORY_CARE_PROVIDER_SITE_OTHER): Payer: Self-pay | Admitting: Family Medicine

## 2018-07-12 ENCOUNTER — Ambulatory Visit (INDEPENDENT_AMBULATORY_CARE_PROVIDER_SITE_OTHER): Payer: 59 | Admitting: Family Medicine

## 2018-07-12 VITALS — BP 137/82 | HR 83 | Temp 97.9°F | Ht 62.0 in | Wt 258.0 lb

## 2018-07-12 DIAGNOSIS — E1122 Type 2 diabetes mellitus with diabetic chronic kidney disease: Secondary | ICD-10-CM

## 2018-07-12 DIAGNOSIS — B373 Candidiasis of vulva and vagina: Secondary | ICD-10-CM | POA: Diagnosis not present

## 2018-07-12 DIAGNOSIS — N183 Chronic kidney disease, stage 3 unspecified: Secondary | ICD-10-CM

## 2018-07-12 DIAGNOSIS — Z9189 Other specified personal risk factors, not elsewhere classified: Secondary | ICD-10-CM

## 2018-07-12 DIAGNOSIS — F3289 Other specified depressive episodes: Secondary | ICD-10-CM | POA: Diagnosis not present

## 2018-07-12 DIAGNOSIS — Z6841 Body Mass Index (BMI) 40.0 and over, adult: Secondary | ICD-10-CM

## 2018-07-12 DIAGNOSIS — Z794 Long term (current) use of insulin: Secondary | ICD-10-CM

## 2018-07-12 DIAGNOSIS — B3731 Acute candidiasis of vulva and vagina: Secondary | ICD-10-CM

## 2018-07-12 MED ORDER — BLOOD GLUCOSE METER KIT: PACK | 0 refills | Status: DC

## 2018-07-12 MED ORDER — FLUCONAZOLE 150 MG PO TABS: 150.0000 mg | ORAL_TABLET | Freq: Once | ORAL | 0 refills | Status: AC

## 2018-07-13 ENCOUNTER — Other Ambulatory Visit: Payer: Self-pay

## 2018-07-13 ENCOUNTER — Encounter (HOSPITAL_COMMUNITY): Payer: Self-pay | Admitting: Psychiatry

## 2018-07-13 ENCOUNTER — Ambulatory Visit (INDEPENDENT_AMBULATORY_CARE_PROVIDER_SITE_OTHER): Payer: PRIVATE HEALTH INSURANCE | Admitting: Psychiatry

## 2018-07-13 ENCOUNTER — Other Ambulatory Visit (HOSPITAL_COMMUNITY): Payer: 59

## 2018-07-13 VITALS — BP 122/80 | HR 100 | Ht 62.0 in | Wt 258.0 lb

## 2018-07-13 DIAGNOSIS — F411 Generalized anxiety disorder: Secondary | ICD-10-CM

## 2018-07-13 DIAGNOSIS — F322 Major depressive disorder, single episode, severe without psychotic features: Secondary | ICD-10-CM | POA: Diagnosis not present

## 2018-07-13 DIAGNOSIS — F4310 Post-traumatic stress disorder, unspecified: Secondary | ICD-10-CM | POA: Diagnosis not present

## 2018-07-13 MED ORDER — BUPROPION HCL ER (XL) 300 MG PO TB24: 300.0000 mg | ORAL_TABLET | Freq: Every day | ORAL | 0 refills | Status: DC

## 2018-07-13 MED ORDER — GABAPENTIN 300 MG PO CAPS: 300.0000 mg | ORAL_CAPSULE | Freq: Two times a day (BID) | ORAL | 1 refills | Status: DC

## 2018-07-13 NOTE — Progress Notes (Signed)
[facility] Outpatient Follow up visit   Patient Identification: Jasmine [name] MRN:  [id number] Date of Evaluation:  07/13/2018 Referral Source: Her therapist Jasmine [name]  Chief Complaint:   Chief Complaint    Follow-up; Other    I am not doing good.  Visit Diagnosis:    ICD-10-CM   1. Generalized anxiety disorder F41.1   2. Severe major depression, single episode, without psychotic features ([place]) F32.2   3. PTSD (post-traumatic stress disorder) F43.10   4. Anxiety state F41.1     History of Present Illness: Jasmine [name] is 52 year old African-American married, She has been part of IOP in past and continues therapy with Jasmine [name] of work, extreme anxiety and not able to cope with husband sickness of cancer, job stress. Did IOP for 2 weeks to work on breathing techniques, anxiety   meds were kept same Feels overwhelmed which got worse over the thanskgiving Wants to keep off work. coudnt function and it made her worse   Tolerating meds, Works at [employer] and has gone thru some harrassment concerns in the past Gets anxious and stressed.    Aggravating factor: husband sickness,job stress and have to take days off Modifying factor: some friends  Past Psychiatric History: Patient denies any history of psychiatric inpatient treatment, suicidal attempt, self abusive behavior, traumatic brain injury or any history of abuse.  Previous Psychotropic Medications: Yes   Substance Abuse History in the last 12 months:  No.  Consequences of Substance Abuse: Negative  Past Medical History:  Past Medical History:  Diagnosis Date  . Anxiety   . Arthritis    "knees" (03/28/2014)  . Chronic renal insufficiency    stage II (mild)  . Depression   . Headache    with high blood sugar  . Hypercholesteremia   . Hypertension   . Numbness and tingling of right arm and leg   . Seasonal allergies   . Type II diabetes mellitus ([place])   . Umbilical hernia without mention of obstruction or gangrene   .  Wears glasses     Past Surgical History:  Procedure Laterality Date  . INSERTION OF MESH N/A 03/28/2014   Procedure: INSERTION OF MESH;  Surgeon: Ralene Ok, MD;  Location: [place];  Service: General;  Laterality: N/A;  . INSERTION OF MESH N/A 05/08/2015   Procedure: INSERTION OF MESH;  Surgeon: Ralene Ok, MD;  Location: [place];  Service: General;  Laterality: N/A;  . LAPAROSCOPIC LYSIS OF ADHESIONS N/A 05/08/2015   Procedure: LAPAROSCOPIC LYSIS OF ADHESIONS;  Surgeon: Ralene Ok, MD;  Location: [place];  Service: General;  Laterality: N/A;  . MULTIPLE TOOTH EXTRACTIONS  ~ 2003  . TONSILLECTOMY    . [place]  . VENTRAL HERNIA REPAIR N/A 03/28/2014   Procedure: LAPAROSCOPIC VENTRAL HERNIA;  Surgeon: Ralene Ok, MD;  Location: [place];  Service: General;  Laterality: N/A;  . VENTRAL HERNIA REPAIR N/A 05/08/2015   Procedure: LAPAROSCOPIC VENTRAL HERNIA REPAIR WITH MESH;  Surgeon: Ralene Ok, MD;  Location: [place];  Service: General;  Laterality: N/A;    Family Psychiatric History: Viewed  Family History:  Family History  Problem Relation Age of Onset  . Diabetes Mother   . High Cholesterol Mother   . Obesity Mother   . Diabetes Father   . Cancer Father        Prostate cancer  . Cancer Sister        Cervical cancer  . Diabetes Brother   .  Bipolar disorder Brother   . Alcohol abuse Brother   . Drug abuse Brother   . Diabetes Sister   . Diabetes Brother     Social History:   Social History   Socioeconomic History  . Marital status: Married    Spouse name: Jasmine [name]  . Number of children: 1  . Years of education: Not on file  . Highest education level: Not on file  Occupational History  . Not on file  Social Needs  . Financial resource strain: Not on file  . Food insecurity:    Worry: Not on file    Inability: Not on file  . Transportation needs:    Medical: Not on file    Non-medical: Not on file  Tobacco Use  . Smoking  status: Former Smoker    Packs/day: 0.25    Years: 26.00    Pack years: 6.50    Types: Cigarettes  . Smokeless tobacco: Never Used  Substance and Sexual Activity  . Alcohol use: Yes    Comment: "have a drink a few times/year"  . Drug use: No  . Sexual activity: Not Currently  Lifestyle  . Physical activity:    Days per week: Not on file    Minutes per session: Not on file  . Stress: Not on file  Relationships  . Social connections:    Talks on phone: Not on file    Gets together: Not on file    Attends religious service: Not on file    Active member of club or organization: Not on file    Attends meetings of clubs or organizations: Not on file    Relationship status: Not on file  Other Topics Concern  . Not on file  Social History Narrative  . Not on file     Allergies:   Allergies  Allergen Reactions  . Eggs Or Egg-Derived Products Hives and Other (See Comments)    FLU VACCINE: caused hives    . Influenza Vaccines Hives    Vaccine from Egg derived product causes Hives  . Effexor [Venlafaxine]     "felt weird"  . Metformin And Related Swelling    ANGIOEDEMA    Metabolic Disorder Labs: Lab Results  Component Value Date   HGBA1C 10.8 (H) 06/14/2018   MPG 384 (H) 01/09/2015   MPG 160 (H) 03/28/2014   No results found for: PROLACTIN Lab Results  Component Value Date   CHOL 174 06/14/2018   TRIG 123 06/14/2018   HDL 46 06/14/2018   CHOLHDL 4 09/09/2017   VLDL 41.4 (H) 09/09/2017   LDLCALC 103 (H) 06/14/2018   LDLCALC 98 10/07/2017     Current Medications: Current Outpatient Medications  Medication Sig Dispense Refill  . blood glucose meter kit and supplies Dispense based on patient and insurance preference. Use up to three times daily as directed. (FOR ICD-10 E10.9, E11.9). 1 each 0  . buPROPion (WELLBUTRIN XL) 300 MG 24 hr tablet Take 1 tablet (300 mg total) by mouth daily. 90 tablet 0  . dapagliflozin propanediol (FARXIGA) 10 MG TABS tablet Take 10 mg  by mouth daily. 90 tablet 1  . Dulaglutide (TRULICITY) 1.5 WE/3.1VQ SOPN Inject 1.5 mg into the skin once a week. 12 pen 1  . gabapentin (NEURONTIN) 300 MG capsule Take 1 capsule (300 mg total) by mouth 2 (two) times daily. 60 capsule 1  . insulin degludec (TRESIBA FLEXTOUCH) 100 UNIT/ML SOPN FlexTouch Pen Inject 0.35 mLs (35 Units total) into the skin  at bedtime. Inject 35 units every pm. (Patient taking differently: Inject 40 Units into the skin at bedtime. Inject 35 units every pm.) 5 pen 0  . olmesartan-hydrochlorothiazide (BENICAR HCT) 20-12.5 MG tablet Take 1 tablet by mouth daily. 90 tablet 1  . sertraline (ZOLOFT) 100 MG tablet Take one and one/half tablet daily. 135 tablet 0  . simvastatin (ZOCOR) 10 MG tablet Take 1 tablet (10 mg total) by mouth daily. 90 tablet 4  . Topiramate ER (TROKENDI XR) 50 MG CP24 Take 1 tablet by mouth daily. 90 capsule 1  . Vitamin D, Ergocalciferol, (DRISDOL) 50000 units CAPS capsule Take 1 capsule (50,000 Units total) by mouth every 7 (seven) days. 4 capsule 0   No current facility-administered medications for this visit.       Psychiatric Specialty Exam: Review of Systems  Cardiovascular: Negative for chest pain.  Skin: Negative for rash.  Psychiatric/Behavioral: The patient is nervous/anxious.     Blood pressure 122/80, pulse 100, height _0  (1.575 m), weight 258 lb (117 kg).Body mass index is 47.19 kg/m.  General Appearance: Casual and Obese  Eye Contact:  Fair  Speech:  Slow  Volume:  Decreased  Mood: overwhelmed  Affect:anxious  Thought Process:  Goal Directed  Orientation:  Full (Time, Place, and Person)  Thought Content:  Rumination  Suicidal Thoughts:  No  Homicidal Thoughts:  No  Memory:  Immediate;   Fair Recent;   Fair Remote;   Fair  Judgement:  Good  Insight:  Good  Psychomotor Activity:  Decreased  Concentration:  Concentration: Fair and Attention Span: Fair  Recall:  Good  Fund of Knowledge:Good  Language: Good   Akathisia:  No  Handed:  Right  AIMS (if indicated):  0  Assets:  Communication Skills Desire for Improvement Housing  ADL's:  Intact  Cognition: WNL  Sleep:  fair    Treatment Plan Summary: Major depressive disorder, recurrent severe: more anxiouds and overwhelemed, continue wellbutrin and zoloft. Increase gabapentin to 319m bid for anxiety and mood Provided supportive therapy.    Generalized anxiety disorder.ongoing, continue zoloft, increase gaba as above  continue therapy Panic attacks : more frequent under stress. Discussed breathing techniques, increase gaba continue zoloft  She is out of work and would need paperwork to be filled, work can further decompensate her.  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 l   NMerian Capron MD 12/4/201910:50 AM

## 2018-07-14 ENCOUNTER — Other Ambulatory Visit (HOSPITAL_COMMUNITY): Payer: 59

## 2018-07-14 NOTE — Progress Notes (Signed)
   THERAPIST PROGRESS NOTE  Session Time: 4:05pm-5pm  Participation Level: Active  Behavioral Response: CasualAlertAnxious, Depressed and Irritable  Type of Therapy: Individual Therapy  Treatment Goals addressed: Coping  Interventions: CBT, Motivational Interviewing and Supportive  Summary: [name] is a 52 y.o. female who presents with symptoms of anxiety and depression. Client reports symptoms worsened over the holidays. Client reports her husbands health has declined and him having more doctor appointments makes her more anxious to return to work. Client wishes to stay out of work to help her husband rather than return to work, client plans to discuss this as her upcoming psychiatry appointment. Additional stress due to health concerns not currently well managed, including her diabetes and lack of support from family and friends related to taking care of her sick husband.  Suicidal/Homicidal: Nowithout intent/plan  Therapist Response: Clinicina inquired about recent stress over the holidays. Clinician inquired about the use of skills previously learned in IOP. Clinician reviewed grounding skills with client however client reports she does not use these and they do not work. Clinician assessed client's need for hospitalization however client did not meet criteria as she identified feeling overwhelmed and wanting situations to change, not to be dead.  Plan: Return pending scheduling related to husbands medical treatment  Diagnosis: Axis I: Major Depression, Recurrent severe     Harlon DittyKarissa A Genecis Veley, LCSW 07/11/2018

## 2018-07-14 NOTE — Progress Notes (Signed)
Office: [phone]  /  Fax: [phone]   HPI:   Chief Complaint: OBESITY Jasmine [name] is here to discuss her progress with her obesity treatment plan. She is following the Category 2 + 100 plan and is following her eating plan approximately 35 % of the time. She states she is exercising 0 minutes 0 times per week. Jasmine [name] has been currently eating less because she has been depressed. 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 Jasmine [name] has been out of work for a month for depression.  Her weight is 258 lb (117 kg) today and has had a weight loss of 2 pounds over a period of 2 weeks since her last visit. She has lost 5 lbs since starting treatment with [place].  Diabetes II Jasmine [name] has a diagnosis of diabetes type II. Her diabetes in not well controlled.  Jasmine [name] states she is feeling better with the increase in Jasmine [name] and [place] dose. She denies hypoglycemic episodes and polydipsia. [name] has not been checking her blood sugars at home because she said that her glucometer is broken.  Last A1c was Hemoglobin A1C Latest Ref Rng & Units 06/14/2018 03/08/2018 10/07/2017  HGBA1C 4.8 - 5.6 % 10.8(H) 7.6(A) 7.7(H)  Some recent data might be hidden    She has been working on intensive lifestyle modifications including diet, exercise, and weight loss to help control her blood glucose levels.  At risk for osteopenia and osteoporosis Kariss is at higher risk of osteopenia and osteoporosis due to vitamin D deficiency.   Vaginal Yeast Infection Jasmine [name] states that she has thick white vaginal discharge and has vulvar itching. She reports this is consistent with previous yeast infections she has had while taking [place].   Depression with emotional eating behaviors Jasmine [name] is struggling with emotional eating and using food for comfort to the extent that it is negatively impacting her health. She often snacks when she is not hungry. Jasmine [name] sometimes feels she is out  of control and then feels guilty that she made poor food choices. She has been working on behavior modification techniques to help reduce her emotional eating and has been somewhat successful. She shows no sign of suicidal or homicidal ideations. She had her first visit with a therapist yesterday and will see psychiatrist tomorrow. She is not sleeping well.   Depression screen [name] 2/9 03/08/2018 10/07/2017 05/20/2017 10/20/2016  Decreased Interest _0 0  Down, Depressed, Hopeless _1 PHQ - 2 Score _2 Altered sleeping 3 0 3 2  Tired, decreased energy _3 Change in appetite _4 Feeling bad or failure about yourself  _5 Trouble concentrating _6 Moving slowly or fidgety/restless _7 0  Suicidal thoughts 0 0 0 0  PHQ-9 Score _8 Difficult doing work/chores Somewhat difficult Somewhat difficult Extremely dIfficult -  Some encounter information is confidential and restricted. Go to Review Flowsheets activity to see all data.  Some recent data might be hidden       ALLERGIES: Allergies  Allergen Reactions  . Eggs Or Egg-Derived Products Hives and Other (See Comments)    FLU VACCINE: caused hives    . Influenza Vaccines Hives    Vaccine from Egg derived product causes Hives  . Effexor [Venlafaxine]     "felt weird"  . Metformin And Related Swelling  ANGIOEDEMA    MEDICATIONS: Current Outpatient Medications on File Prior to Visit  Medication Sig Dispense Refill  . dapagliflozin propanediol (FARXIGA) 10 MG TABS tablet Take 10 mg by mouth daily. 90 tablet 1  . Dulaglutide (TRULICITY) 1.5 WE/3.1VQ SOPN Inject 1.5 mg into the skin once a week. 12 pen 1  . insulin degludec (TRESIBA FLEXTOUCH) 100 UNIT/ML SOPN FlexTouch Pen Inject 0.35 mLs (35 Units total) into the skin at bedtime. Inject 35 units every pm. (Patient taking differently: Inject 40 Units into the skin at bedtime. Inject 35 units every pm.) 5 pen 0  . olmesartan-hydrochlorothiazide  (BENICAR HCT) 20-12.5 MG tablet Take 1 tablet by mouth daily. 90 tablet 1  . sertraline (ZOLOFT) 100 MG tablet Take one and one/half tablet daily. 135 tablet 0  . simvastatin (ZOCOR) 10 MG tablet Take 1 tablet (10 mg total) by mouth daily. 90 tablet 4  . Topiramate ER (TROKENDI XR) 50 MG CP24 Take 1 tablet by mouth daily. 90 capsule 1  . Vitamin D, Ergocalciferol, (DRISDOL) 50000 units CAPS capsule Take 1 capsule (50,000 Units total) by mouth every 7 (seven) days. 4 capsule 0  . [DISCONTINUED] DULoxetine (CYMBALTA) 20 MG capsule Take 1 capsule (20 mg total) by mouth daily. 90 capsule 1   No current facility-administered medications on file prior to visit.     PAST MEDICAL HISTORY: Past Medical History:  Diagnosis Date  . Anxiety   . Arthritis    "knees" (03/28/2014)  . Chronic renal insufficiency    stage II (mild)  . Depression   . Headache    with high blood sugar  . Hypercholesteremia   . Hypertension   . Numbness and tingling of right arm and leg   . Seasonal allergies   . Type II diabetes mellitus ([place])   . Umbilical hernia without mention of obstruction or gangrene   . Wears glasses     PAST SURGICAL HISTORY: Past Surgical History:  Procedure Laterality Date  . INSERTION OF MESH N/A 03/28/2014   Procedure: INSERTION OF MESH;  Surgeon: Ralene Ok, MD;  Location: [place];  Service: General;  Laterality: N/A;  . INSERTION OF MESH N/A 05/08/2015   Procedure: INSERTION OF MESH;  Surgeon: Ralene Ok, MD;  Location: [place];  Service: General;  Laterality: N/A;  . LAPAROSCOPIC LYSIS OF ADHESIONS N/A 05/08/2015   Procedure: LAPAROSCOPIC LYSIS OF ADHESIONS;  Surgeon: Ralene Ok, MD;  Location: [place];  Service: General;  Laterality: N/A;  . MULTIPLE TOOTH EXTRACTIONS  ~ 2003  . TONSILLECTOMY    . [place]  . VENTRAL HERNIA REPAIR N/A 03/28/2014   Procedure: LAPAROSCOPIC VENTRAL HERNIA;  Surgeon: Ralene Ok, MD;  Location: [place];  Service:  General;  Laterality: N/A;  . VENTRAL HERNIA REPAIR N/A 05/08/2015   Procedure: LAPAROSCOPIC VENTRAL HERNIA REPAIR WITH MESH;  Surgeon: Ralene Ok, MD;  Location: [place];  Service: General;  Laterality: N/A;    SOCIAL HISTORY: Social History   Tobacco Use  . Smoking status: Former Smoker    Packs/day: 0.25    Years: 26.00    Pack years: 6.50    Types: Cigarettes  . Smokeless tobacco: Never Used  Substance Use Topics  . Alcohol use: Yes    Comment: "have a drink a few times/year"  . Drug use: No    FAMILY HISTORY: Family History  Problem Relation Age of Onset  . Diabetes Mother   . High Cholesterol Mother   . Obesity  Mother   . Diabetes Father   . Cancer Father        Prostate cancer  . Cancer Sister        Cervical cancer  . Diabetes Brother   . Bipolar disorder Brother   . Alcohol abuse Brother   . Drug abuse Brother   . Diabetes Sister   . Diabetes Brother     ROS: Review of Systems  Constitutional: Positive for weight loss.  Genitourinary:       Positive for thick, white vaginal discharge.  Skin: Positive for itching (vulvar).  Endo/Heme/Allergies: Negative for polydipsia.       Negative for hypoglycemia   Psychiatric/Behavioral: Positive for depression. Negative for suicidal ideas.       Negative for homicidal ideations    PHYSICAL EXAM: Blood pressure 137/82, pulse 83, temperature 97.9 F (36.6 C), temperature source Oral, height _0  (1.575 m), weight 258 lb (117 kg), SpO2 98 %. Body mass index is 47.19 kg/m. Physical Exam  Constitutional: She is oriented to person, place, and time. She appears well-developed and well-nourished.  Cardiovascular: Normal rate.  Pulmonary/Chest: Effort normal.  Musculoskeletal: Normal range of motion.  Neurological: She is alert and oriented to person, place, and time.  Skin: Skin is warm and dry.  Psychiatric: She has a normal mood and affect. Her behavior is normal.  Vitals reviewed.   RECENT LABS AND  TESTS: BMET    Component Value Date/Time   NA 138 06/14/2018 1101   K 5.0 06/14/2018 1101   CL 101 06/14/2018 1101   CO2 21 06/14/2018 1101   GLUCOSE 179 (H) 06/14/2018 1101   GLUCOSE 96 09/09/2017 1430   BUN 19 06/14/2018 1101   CREATININE 1.56 (H) 06/14/2018 1101   CREATININE 1.50 (H) 09/09/2017 1430   CALCIUM 10.2 06/14/2018 1101   GFRNONAA 38 (L) 06/14/2018 1101   GFRNONAA 40 (L) 09/09/2017 1430   GFRAA 44 (L) 06/14/2018 1101   GFRAA 47 (L) 09/09/2017 1430   Lab Results  Component Value Date   HGBA1C 10.8 (H) 06/14/2018   HGBA1C 7.6 (A) 03/08/2018   HGBA1C 7.7 (H) 10/07/2017   HGBA1C 8.4 07/12/2017   HGBA1C 7.1 04/07/2017   Lab Results  Component Value Date   INSULIN 20.8 06/14/2018   INSULIN 24.5 10/07/2017   CBC    Component Value Date/Time   WBC 5.0 10/07/2017 1146   WBC 4.7 11/26/2016 1134   RBC 5.46 (H) 10/07/2017 1146   RBC 5.60 (H) 11/26/2016 1134   HGB 12.8 10/07/2017 1146   HCT 39.3 10/27/2017 1128   PLT 288 11/26/2016 1134   MCV 72 (L) 10/07/2017 1146   MCH 23.4 (L) 10/07/2017 1146   MCH 23.2 (L) 11/26/2016 1134   MCHC 32.4 10/07/2017 1146   MCHC 31.5 (L) 11/26/2016 1134   RDW 16.5 (H) 10/07/2017 1146   LYMPHSABS 2.8 10/07/2017 1146   MONOABS 300 08/19/2016 1401   EOSABS 0.2 10/07/2017 1146   BASOSABS 0.1 10/07/2017 1146   Iron/TIBC/Ferritin/ %Sat    Component Value Date/Time   IRON 76 10/27/2017 1128   TIBC 375 10/27/2017 1128   FERRITIN 48 10/27/2017 1128   IRONPCTSAT 20 10/27/2017 1128   Lipid Panel     Component Value Date/Time   CHOL 174 06/14/2018 1101   TRIG 123 06/14/2018 1101   HDL 46 06/14/2018 1101   CHOLHDL 4 09/09/2017 1430   VLDL 41.4 (H) 09/09/2017 1430   LDLCALC 103 (H) 06/14/2018 1101   LDLDIRECT 103.0  09/09/2017 1430   Hepatic Function Panel     Component Value Date/Time   PROT 7.5 06/14/2018 1101   ALBUMIN 4.8 06/14/2018 1101   AST 14 06/14/2018 1101   ALT 17 06/14/2018 1101   ALKPHOS 89 06/14/2018 1101    BILITOT 0.2 06/14/2018 1101      Component Value Date/Time   TSH 1.230 10/07/2017 1146   TSH 1.36 11/26/2016 1134   TSH 0.860 02/08/2015 0945  Results for AILIN, ROCHFORD (MRN [id number]) as of 07/14/2018 13:51  Ref. Range 06/14/2018 11:01  Vitamin D, 25-Hydroxy Latest Ref Range: 30.0 - 100.0 ng/mL 14.8 (L)    ASSESSMENT AND PLAN: Type 2 diabetes mellitus without complication, without long-term current use of insulin (HCC)  Vaginal yeast infection - Plan: fluconazole (DIFLUCAN) 150 MG tablet  Other depression - with emotional eating  At risk for osteoporosis  Class 3 severe obesity with serious comorbidity and body mass index (BMI) of 45.0 to 49.9 in adult, unspecified obesity type ([place])  Type 2 diabetes mellitus with stage 3 chronic kidney disease, with long-term current use of insulin ([place]) - Plan: blood glucose meter kit and supplies  PLAN: Diabetes II Kamren has been given extensive diabetes education by myself today including ideal fasting and post-prandial blood glucose readings, individual ideal HgA1c goals  and hypoglycemia prevention. We discussed the importance of good blood sugar control to decrease the likelihood of diabetic complications such as nephropathy, neuropathy, limb loss, blindness, coronary artery disease, and death. We discussed the importance of intensive lifestyle modification including diet, exercise and weight loss as the first line treatment for diabetes. Shahla agrees to continue her [place], [place] at 50 units. She has agreed to resume checking blood sugars three times daily. New glucometer and supplies sent in today. Halli agrees to follow up in our office in 2 weeks. She will also make an appointment to see Dr. Renne Crigler her endocrinologist.   At risk for osteopenia and osteoporosis Lamisha was given extended  (15 minutes) osteoporosis prevention counseling today. Genevieve is at risk for osteopenia and osteoporsis.Marland Kitchen She was encouraged to  take her vitamin D and follow her higher calcium diet and increase strengthening exercise to help strengthen her bones and decrease her risk of osteopenia and osteoporosis.  Vaginal Yeast Infection Vaginal yeast infection likely related to [place]. A prescription for Diflucan 112m qd #2 with no refills was sent to pharmacy. Pt aware she can take a 2nd dose in 72 hours if no improvement. YConoragrees to follow up in our office in 2 weeks.  Depression with Emotional Eating Behaviors We discussed behavior modification techniques today to help YRanaedeal with her emotional eating and depression. She has agreed to continue seeing her therapist and psychiatrist. YMaishaagrees to follow up with our office in 2 weeks.   Obesity YNakieshais currently in the action stage of change. As such, her goal is to continue with weight loss efforts She has agreed to follow the Category 2 plan + 100 Zeda has not been prescribed exercise at this time. We discussed the following Behavioral Modification Strategies today: increasing lean protein intake and planning for success  YOsiehas agreed to follow up with our clinic in 2 weeks. She was informed of the importance of frequent follow up visits to maximize her success with intensive lifestyle modifications for her multiple health conditions.   OBESITY BEHAVIORAL INTERVENTION VISIT  Today's visit was # 9   Starting weight: 263 lbs Starting date: 10/07/2017 Today's  weight : Weight: 258 lb (117 kg)  Today's date: 07/12/2018 Total lbs lost to date: 5 lbs At least 15 minutes were spent on discussing the following behavioral intervention visit.   ASK: We discussed the diagnosis of obesity with [name] today and Kenzleigh agreed to give [place] permission to discuss obesity behavioral modification therapy today.  ASSESS: Kallista has the diagnosis of obesity and her BMI today is 47.18 Tesa is in the action stage of change   ADVISE: Ardine was  educated on the multiple health risks of obesity as well as the benefit of weight loss to improve her health. She was advised of the need for long term treatment and the importance of lifestyle modifications to improve her current health and to decrease her risk of future health problems.  AGREE: Multiple dietary modification options and treatment options were discussed and  Valora agreed to follow the recommendations documented in the above note.  ARRANGE: Kemper was educated on the importance of frequent visits to treat obesity as outlined per CMS and USPSTF guidelines and agreed to schedule her next follow up appointment today.  I, Remi Deter, CMA, am acting as Location manager for [employer], FNP-C.  I have reviewed the above documentation for accuracy and completeness, and I agree with the above.  - Ailea Rhatigan, FNP-C.

## 2018-07-15 ENCOUNTER — Other Ambulatory Visit (HOSPITAL_COMMUNITY): Payer: 59

## 2018-07-18 ENCOUNTER — Other Ambulatory Visit (HOSPITAL_COMMUNITY): Payer: 59

## 2018-07-18 ENCOUNTER — Encounter (INDEPENDENT_AMBULATORY_CARE_PROVIDER_SITE_OTHER): Payer: Self-pay | Admitting: Family Medicine

## 2018-07-19 ENCOUNTER — Ambulatory Visit (HOSPITAL_COMMUNITY): Payer: 59 | Admitting: Psychiatry

## 2018-07-20 ENCOUNTER — Other Ambulatory Visit: Payer: Self-pay | Admitting: Internal Medicine

## 2018-07-20 ENCOUNTER — Other Ambulatory Visit (HOSPITAL_COMMUNITY): Payer: 59

## 2018-07-20 DIAGNOSIS — Z794 Long term (current) use of insulin: Principal | ICD-10-CM

## 2018-07-20 DIAGNOSIS — E1122 Type 2 diabetes mellitus with diabetic chronic kidney disease: Secondary | ICD-10-CM

## 2018-07-21 ENCOUNTER — Other Ambulatory Visit (HOSPITAL_COMMUNITY): Payer: 59

## 2018-07-22 ENCOUNTER — Other Ambulatory Visit (HOSPITAL_COMMUNITY): Payer: 59

## 2018-07-25 ENCOUNTER — Other Ambulatory Visit (HOSPITAL_COMMUNITY): Payer: 59

## 2018-07-26 ENCOUNTER — Other Ambulatory Visit (HOSPITAL_COMMUNITY): Payer: 59

## 2018-07-26 ENCOUNTER — Ambulatory Visit (INDEPENDENT_AMBULATORY_CARE_PROVIDER_SITE_OTHER): Payer: 59 | Admitting: Family Medicine

## 2018-07-26 ENCOUNTER — Encounter (INDEPENDENT_AMBULATORY_CARE_PROVIDER_SITE_OTHER): Payer: Self-pay | Admitting: Family Medicine

## 2018-07-26 VITALS — BP 123/71 | HR 90 | Temp 97.7°F | Ht 62.0 in | Wt 260.0 lb

## 2018-07-26 DIAGNOSIS — N183 Chronic kidney disease, stage 3 unspecified: Secondary | ICD-10-CM

## 2018-07-26 DIAGNOSIS — E559 Vitamin D deficiency, unspecified: Secondary | ICD-10-CM | POA: Diagnosis not present

## 2018-07-26 DIAGNOSIS — E1122 Type 2 diabetes mellitus with diabetic chronic kidney disease: Secondary | ICD-10-CM

## 2018-07-26 DIAGNOSIS — Z6841 Body Mass Index (BMI) 40.0 and over, adult: Secondary | ICD-10-CM

## 2018-07-26 DIAGNOSIS — Z9189 Other specified personal risk factors, not elsewhere classified: Secondary | ICD-10-CM

## 2018-07-26 DIAGNOSIS — Z794 Long term (current) use of insulin: Secondary | ICD-10-CM

## 2018-07-26 DIAGNOSIS — E66813 Obesity, class 3: Secondary | ICD-10-CM

## 2018-07-26 MED ORDER — VITAMIN D (ERGOCALCIFEROL) 1.25 MG (50000 UNIT) PO CAPS: 50000.0000 [IU] | ORAL_CAPSULE | ORAL | 0 refills | Status: DC

## 2018-07-26 NOTE — Progress Notes (Signed)
Office: [phone]  /  Fax: [phone]   HPI:   Chief Complaint: OBESITY Jasmine [name] is here to discuss her progress with her obesity treatment plan. She is on the Category 2 plan + 100 calories and is following her eating plan approximately 65% of the time. She states she is exercising 0 minutes 0 times per week. Jasmine [name] is going back to work after being off work recently for depression. She feels much better and is doing more food prep. Her husband is also doing better and about to complete a course of radiation for his cancer.  Her weight is 260 lb (117.9 kg) today and has gained 2 pounds since her last visit. She has lost 3 lbs since starting treatment with [place].  Diabetes II with Stage 3 Chronic Kidney Disease Jasmine [name] has a diagnosis of diabetes type II. Jasmine [name] is on Tresiba (50 units), Farxiga 10 mg, and Trulicity 1.5 mg weekly. She states fasting BGs range between 170 and 180, and 2 hour post prandial range between 160 and 170. She denies hypoglycemia. Last A1c was 10.8. She feels better since sugars are better controlled.  She has a appointment with Dr. Renne Crigler (Endocrinology) in March. She has been working on intensive lifestyle modifications including diet, exercise, and weight loss to help control her blood glucose levels.  Vitamin D Deficiency Jasmine [name] has a diagnosis of vitamin D deficiency. She is currently taking prescription Vit D, but level is not at goal. Last Vit D was 14.8 on 06/14/18. She denies fatigue, nausea, vomiting or muscle weakness.  At risk for osteopenia and osteoporosis Jasmine [name] is at higher risk of osteopenia and osteoporosis due to vitamin D deficiency.   ALLERGIES: Allergies  Allergen Reactions  . Eggs Or Egg-Derived Products Hives and Other (See Comments)    FLU VACCINE: caused hives    . Influenza Vaccines Hives    Vaccine from Egg derived product causes Hives  . Effexor [Venlafaxine]     "felt weird"  . Metformin And Related Swelling    ANGIOEDEMA     MEDICATIONS: Current Outpatient Medications on File Prior to Visit  Medication Sig Dispense Refill  . blood glucose meter kit and supplies Dispense based on patient and insurance preference. Use up to three times daily as directed. (FOR ICD-10 E10.9, E11.9). 1 each 0  . buPROPion (WELLBUTRIN XL) 300 MG 24 hr tablet Take 1 tablet (300 mg total) by mouth daily. 90 tablet 0  . dapagliflozin propanediol (FARXIGA) 10 MG TABS tablet Take 10 mg by mouth daily. 90 tablet 1  . gabapentin (NEURONTIN) 300 MG capsule Take 1 capsule (300 mg total) by mouth 2 (two) times daily. 60 capsule 1  . insulin degludec (TRESIBA FLEXTOUCH) 100 UNIT/ML SOPN FlexTouch Pen Inject 0.35 mLs (35 Units total) into the skin at bedtime. Inject 35 units every pm. (Patient taking differently: Inject 40 Units into the skin at bedtime. Inject 35 units every pm.) 5 pen 0  . olmesartan-hydrochlorothiazide (BENICAR HCT) 20-12.5 MG tablet Take 1 tablet by mouth daily. 90 tablet 1  . sertraline (ZOLOFT) 100 MG tablet Take one and one/half tablet daily. 135 tablet 0  . simvastatin (ZOCOR) 10 MG tablet Take 1 tablet (10 mg total) by mouth daily. 90 tablet 4  . Topiramate ER (TROKENDI XR) 50 MG CP24 Take 1 tablet by mouth daily. 90 capsule 1  . TRULICITY 1.5 OZ/3.0QM SOPN INJECT 1.5 MG INTO THE SKIN ONCE A WEEK. 6 pen 1  . [DISCONTINUED] DULoxetine (CYMBALTA) 20 MG capsule  Take 1 capsule (20 mg total) by mouth daily. 90 capsule 1   No current facility-administered medications on file prior to visit.     PAST MEDICAL HISTORY: Past Medical History:  Diagnosis Date  . Anxiety   . Arthritis    "knees" (03/28/2014)  . Chronic renal insufficiency    stage II (mild)  . Depression   . Headache    with high blood sugar  . Hypercholesteremia   . Hypertension   . Numbness and tingling of right arm and leg   . Seasonal allergies   . Type II diabetes mellitus ([employer])   . Umbilical hernia without mention of obstruction or gangrene   .  Wears glasses     PAST SURGICAL HISTORY: Past Surgical History:  Procedure Laterality Date  . INSERTION OF MESH N/A 03/28/2014   Procedure: INSERTION OF MESH;  Surgeon: Ralene Ok, MD;  Location: [facility];  Service: General;  Laterality: N/A;  . INSERTION OF MESH N/A 05/08/2015   Procedure: INSERTION OF MESH;  Surgeon: Ralene Ok, MD;  Location: [place];  Service: General;  Laterality: N/A;  . LAPAROSCOPIC LYSIS OF ADHESIONS N/A 05/08/2015   Procedure: LAPAROSCOPIC LYSIS OF ADHESIONS;  Surgeon: Ralene Ok, MD;  Location: [place];  Service: General;  Laterality: N/A;  . MULTIPLE TOOTH EXTRACTIONS  ~ 2003  . TONSILLECTOMY    . [place]  . VENTRAL HERNIA REPAIR N/A 03/28/2014   Procedure: LAPAROSCOPIC VENTRAL HERNIA;  Surgeon: Ralene Ok, MD;  Location: [place];  Service: General;  Laterality: N/A;  . VENTRAL HERNIA REPAIR N/A 05/08/2015   Procedure: LAPAROSCOPIC VENTRAL HERNIA REPAIR WITH MESH;  Surgeon: Ralene Ok, MD;  Location: [facility];  Service: General;  Laterality: N/A;    SOCIAL HISTORY: Social History   Tobacco Use  . Smoking status: Former Smoker    Packs/day: 0.25    Years: 26.00    Pack years: 6.50    Types: Cigarettes  . Smokeless tobacco: Never Used  Substance Use Topics  . Alcohol use: Yes    Comment: "have a drink a few times/year"  . Drug use: No    FAMILY HISTORY: Family History  Problem Relation Age of Onset  . Diabetes Mother   . High Cholesterol Mother   . Obesity Mother   . Diabetes Father   . Cancer Father        Prostate cancer  . Cancer Sister        Cervical cancer  . Diabetes Brother   . Bipolar disorder Brother   . Alcohol abuse Brother   . Drug abuse Brother   . Diabetes Sister   . Diabetes Brother     ROS: Review of Systems  Constitutional: Negative for malaise/fatigue and weight loss.  Gastrointestinal: Negative for nausea and vomiting.  Musculoskeletal:       Negative muscle weakness    Endo/Heme/Allergies:       Negative hypoglycemia    PHYSICAL EXAM: Blood pressure 123/71, pulse 90, temperature 97.7 F (36.5 C), temperature source Oral, height _0  (1.575 m), weight 260 lb (117.9 kg), SpO2 98 %. Body mass index is 47.55 kg/m. Physical Exam Vitals signs reviewed.  Constitutional:      Appearance: Normal appearance. She is obese.  Cardiovascular:     Rate and Rhythm: Normal rate.  Pulmonary:     Effort: Pulmonary effort is normal.  Musculoskeletal: Normal range of motion.  Skin:    General: Skin is warm and dry.  Neurological:     Mental Status: She is alert and oriented to person, place, and time.  Psychiatric:        Mood and Affect: Mood normal.        Behavior: Behavior normal.     RECENT LABS AND TESTS: BMET    Component Value Date/Time   NA 138 06/14/2018 1101   K 5.0 06/14/2018 1101   CL 101 06/14/2018 1101   CO2 21 06/14/2018 1101   GLUCOSE 179 (H) 06/14/2018 1101   GLUCOSE 96 09/09/2017 1430   BUN 19 06/14/2018 1101   CREATININE 1.56 (H) 06/14/2018 1101   CREATININE 1.50 (H) 09/09/2017 1430   CALCIUM 10.2 06/14/2018 1101   GFRNONAA 38 (L) 06/14/2018 1101   GFRNONAA 40 (L) 09/09/2017 1430   GFRAA 44 (L) 06/14/2018 1101   GFRAA 47 (L) 09/09/2017 1430   Lab Results  Component Value Date   HGBA1C 10.8 (H) 06/14/2018   HGBA1C 7.6 (A) 03/08/2018   HGBA1C 7.7 (H) 10/07/2017   HGBA1C 8.4 07/12/2017   HGBA1C 7.1 04/07/2017   Lab Results  Component Value Date   INSULIN 20.8 06/14/2018   INSULIN 24.5 10/07/2017   CBC    Component Value Date/Time   WBC 5.0 10/07/2017 1146   WBC 4.7 11/26/2016 1134   RBC 5.46 (H) 10/07/2017 1146   RBC 5.60 (H) 11/26/2016 1134   HGB 12.8 10/07/2017 1146   HCT 39.3 10/27/2017 1128   PLT 288 11/26/2016 1134   MCV 72 (L) 10/07/2017 1146   MCH 23.4 (L) 10/07/2017 1146   MCH 23.2 (L) 11/26/2016 1134   MCHC 32.4 10/07/2017 1146   MCHC 31.5 (L) 11/26/2016 1134   RDW 16.5 (H) 10/07/2017 1146    LYMPHSABS 2.8 10/07/2017 1146   MONOABS 300 08/19/2016 1401   EOSABS 0.2 10/07/2017 1146   BASOSABS 0.1 10/07/2017 1146   Iron/TIBC/Ferritin/ %Sat    Component Value Date/Time   IRON 76 10/27/2017 1128   TIBC 375 10/27/2017 1128   FERRITIN 48 10/27/2017 1128   IRONPCTSAT 20 10/27/2017 1128   Lipid Panel     Component Value Date/Time   CHOL 174 06/14/2018 1101   TRIG 123 06/14/2018 1101   HDL 46 06/14/2018 1101   CHOLHDL 4 09/09/2017 1430   VLDL 41.4 (H) 09/09/2017 1430   LDLCALC 103 (H) 06/14/2018 1101   LDLDIRECT 103.0 09/09/2017 1430   Hepatic Function Panel     Component Value Date/Time   PROT 7.5 06/14/2018 1101   ALBUMIN 4.8 06/14/2018 1101   AST 14 06/14/2018 1101   ALT 17 06/14/2018 1101   ALKPHOS 89 06/14/2018 1101   BILITOT 0.2 06/14/2018 1101      Component Value Date/Time   TSH 1.230 10/07/2017 1146   TSH 1.36 11/26/2016 1134   TSH 0.860 02/08/2015 0945  Results for KEMI, GELL (MRN [id number]) as of 07/26/2018 16:43  Ref. Range 06/14/2018 11:01  Vitamin D, 25-Hydroxy Latest Ref Range: 30.0 - 100.0 ng/mL 14.8 (L)    ASSESSMENT AND PLAN: Vitamin D deficiency - Plan: Vitamin D, Ergocalciferol, (DRISDOL) 1.25 MG (50000 UT) CAPS capsule  Type 2 diabetes mellitus with stage 3 chronic kidney disease, with long-term current use of insulin (HCC)  At risk for osteoporosis  Class 3 severe obesity with serious comorbidity and body mass index (BMI) of 45.0 to 49.9 in adult, unspecified obesity type ([place])  PLAN:  Vitamin D Deficiency Jasmine [name] was informed that low vitamin D levels contributes to fatigue and are associated  with obesity, breast, and colon cancer. Jasmine [name] agrees to continue taking prescription Vit D _0 ,000 IU every week #4 and we will refill for 1 month. She will follow up for routine testing of vitamin D, at least 2-3 times per year. She was informed of the risk of over-replacement of vitamin D and agrees to not increase her dose unless she  discusses this with [place] first. Jasmine [name] agrees to follow up with our clinic in 2 to 3 weeks.  At risk for osteopenia and osteoporosis Jasmine [name] was given extended (15 minutes) osteoporosis prevention counseling today. Jasmine [name] is at risk for osteopenia and osteoporsis due to her vitamin D deficiency. She was encouraged to take her vitamin D and follow her higher calcium diet and increase strengthening exercise to help strengthen her bones and decrease her risk of osteopenia and osteoporosis.  Diabetes II with Stage 3 Chronic Kidney Disease Jasmine [name] has been given extensive diabetes education by myself today including ideal fasting and post-prandial blood glucose readings, individual ideal Hgb A1c goals and hypoglycemia prevention. We discussed the importance of good blood sugar control to decrease the likelihood of diabetic complications such as nephropathy, neuropathy, limb loss, blindness, coronary artery disease, and death. We discussed the importance of intensive lifestyle modification including diet, exercise and weight loss as the first line treatment for diabetes. Jasmine [name] agrees to continue increase [place] to 55 units at night and continue [place] and Trulicity. Jasmine [name] agrees to follow up with our clinic in 2 to 3 weeks.  Obesity Jasmine [name] is currently in the action stage of change. As such, her goal is to continue with weight loss efforts She has agreed to follow the Category 2 plan Jasmine [name] has not been prescribed exercise at this time. We discussed the following Behavioral Modification Strategies today: holiday eating strategies and planning for success   Jasmine [name] has agreed to follow up with our clinic in 2 to 3 weeks. She was informed of the importance of frequent follow up visits to maximize her success with intensive lifestyle modifications for her multiple health conditions.   OBESITY BEHAVIORAL INTERVENTION VISIT  Today's visit was # 10  Starting weight: 263 lbs Starting date:  10/07/17 Today's weight : 260 lbs  Today's date: 07/26/2018 Total lbs lost to date: 3    ASK: We discussed the diagnosis of obesity with Jasmine [name] today and Blessin agreed to give [place] permission to discuss obesity behavioral modification therapy today.  ASSESS: Jasmine [name] has the diagnosis of obesity and her BMI today is 47.54 Jasmine [name] is in the action stage of change   ADVISE: Jasmine [name] was educated on the multiple health risks of obesity as well as the benefit of weight loss to improve her health. She was advised of the need for long term treatment and the importance of lifestyle modifications to improve her current health and to decrease her risk of future health problems.  AGREE: Multiple dietary modification options and treatment options were discussed and  Jasmine [name] agreed to follow the recommendations documented in the above note.  ARRANGE: Jasmine [name] was educated on the importance of frequent visits to treat obesity as outlined per CMS and USPSTF guidelines and agreed to schedule her next follow up appointment today.  Wilhemena Durie, am acting as Location manager for [employer], FNP-C.  I have reviewed the above documentation for accuracy and completeness, and I agree with the above.  - Jazel Nimmons, FNP-C.

## 2018-07-27 ENCOUNTER — Encounter (INDEPENDENT_AMBULATORY_CARE_PROVIDER_SITE_OTHER): Payer: Self-pay | Admitting: Family Medicine

## 2018-07-27 ENCOUNTER — Other Ambulatory Visit (HOSPITAL_COMMUNITY): Payer: 59

## 2018-07-28 ENCOUNTER — Other Ambulatory Visit (HOSPITAL_COMMUNITY): Payer: 59

## 2018-07-29 ENCOUNTER — Other Ambulatory Visit (HOSPITAL_COMMUNITY): Payer: 59

## 2018-08-01 ENCOUNTER — Other Ambulatory Visit (HOSPITAL_COMMUNITY): Payer: 59

## 2018-08-01 ENCOUNTER — Other Ambulatory Visit: Payer: Self-pay | Admitting: Physician Assistant

## 2018-08-01 DIAGNOSIS — E1122 Type 2 diabetes mellitus with diabetic chronic kidney disease: Secondary | ICD-10-CM

## 2018-08-01 DIAGNOSIS — Z794 Long term (current) use of insulin: Principal | ICD-10-CM

## 2018-08-08 ENCOUNTER — Other Ambulatory Visit: Payer: Self-pay | Admitting: Physician Assistant

## 2018-08-08 DIAGNOSIS — F411 Generalized anxiety disorder: Secondary | ICD-10-CM

## 2018-08-08 DIAGNOSIS — F330 Major depressive disorder, recurrent, mild: Secondary | ICD-10-CM

## 2018-08-13 ENCOUNTER — Other Ambulatory Visit (HOSPITAL_COMMUNITY): Payer: Self-pay | Admitting: Psychiatry

## 2018-08-16 ENCOUNTER — Ambulatory Visit (INDEPENDENT_AMBULATORY_CARE_PROVIDER_SITE_OTHER): Payer: 59 | Admitting: Family Medicine

## 2018-08-16 ENCOUNTER — Encounter (INDEPENDENT_AMBULATORY_CARE_PROVIDER_SITE_OTHER): Payer: Self-pay | Admitting: Family Medicine

## 2018-08-16 VITALS — BP 122/75 | HR 82 | Temp 98.3°F | Ht 62.0 in | Wt 259.0 lb

## 2018-08-16 DIAGNOSIS — Z9189 Other specified personal risk factors, not elsewhere classified: Secondary | ICD-10-CM | POA: Diagnosis not present

## 2018-08-16 DIAGNOSIS — E1165 Type 2 diabetes mellitus with hyperglycemia: Secondary | ICD-10-CM

## 2018-08-16 DIAGNOSIS — I10 Essential (primary) hypertension: Secondary | ICD-10-CM | POA: Diagnosis not present

## 2018-08-16 DIAGNOSIS — Z6841 Body Mass Index (BMI) 40.0 and over, adult: Secondary | ICD-10-CM

## 2018-08-16 DIAGNOSIS — E66813 Obesity, class 3: Secondary | ICD-10-CM

## 2018-08-16 DIAGNOSIS — Z794 Long term (current) use of insulin: Secondary | ICD-10-CM

## 2018-08-16 MED ORDER — INSULIN DEGLUDEC 100 UNIT/ML ~~LOC~~ SOPN: 55.0000 [IU] | PEN_INJECTOR | Freq: Every day | SUBCUTANEOUS | 0 refills | Status: DC

## 2018-08-17 ENCOUNTER — Other Ambulatory Visit: Payer: Self-pay

## 2018-08-17 DIAGNOSIS — F330 Major depressive disorder, recurrent, mild: Secondary | ICD-10-CM

## 2018-08-17 DIAGNOSIS — F411 Generalized anxiety disorder: Secondary | ICD-10-CM

## 2018-08-17 MED ORDER — SERTRALINE HCL 100 MG PO TABS: ORAL_TABLET | ORAL | 0 refills | Status: DC

## 2018-08-17 NOTE — Progress Notes (Signed)
Office: [phone]  /  Fax: [phone]   HPI:   Chief Complaint: OBESITY Jasmine [name] is here to discuss her progress with her obesity treatment plan. She is on the Category 2 plan and is following her eating plan approximately 35 % of the time. She states she is exercising 0 minutes 0 times per week. Jasmine [name] reports feeling up and down over the holidays. She goes to work on Monday and she is feeling slightly more stress because of this. She is struggling to follow the plan.  Her weight is 259 lb (117.5 kg) today and has had a weight loss of 1 pound over a period of 3 weeks since her last visit. She has lost 4 lbs since starting treatment with [place].  Diabetes II with Hyperglycemia Jasmine [name] has a diagnosis of diabetes type II. Jasmine [name] is on [place], and [place]. She states fasting BGs are <120's and post prandial <160. Last A1c was 10.8. She has been working on intensive lifestyle modifications including diet, exercise, and weight loss to help control her blood glucose levels.  Hypertension Jasmine [name] is a 53 y.o. female with hypertension. Jasmine [name]'s blood pressure is controlled. She denies chest pain, chest pressure, or headaches. She is working weight loss to help control her blood pressure with the goal of decreasing her risk of heart attack and stroke.   At risk for cardiovascular disease Jasmine [name] is at a higher than average risk for cardiovascular disease due to obesity, diabetes II, and hypertension. She currently denies any chest pain.  ASSESSMENT AND PLAN:  Type 2 diabetes mellitus with hyperglycemia, with long-term current use of insulin (HCC) - Plan: insulin degludec (TRESIBA FLEXTOUCH) 100 UNIT/ML SOPN FlexTouch Pen  Essential hypertension  At risk for heart disease  Class 3 severe obesity with serious comorbidity and body mass index (BMI) of 45.0 to 49.9 in adult, unspecified obesity type ([place])  PLAN:  Diabetes II with Hypertension Jasmine [name] has been given  extensive diabetes education by myself today including ideal fasting and post-prandial blood glucose readings, individual ideal Hgb A1c goals and hypoglycemia prevention. We discussed the importance of good blood sugar control to decrease the likelihood of diabetic complications such as nephropathy, neuropathy, limb loss, blindness, coronary artery disease, and death. We discussed the importance of intensive lifestyle modification including diet, exercise and weight loss as the first line treatment for diabetes. Jasmine [name] agrees to continue her current diabetes medications and she agrees to continue [place] 55 units SubQ daily #15 pens (5 pens, 3 boxes) with no refills. Jasmine [name] agrees to follow up with our clinic in 2 weeks.   Hypertension We discussed sodium restriction, working on healthy weight loss, and a regular exercise program as the means to achieve improved blood pressure control. Jasmine [name] agreed with this plan and agreed to follow up as directed. We will continue to monitor her blood pressure as well as her progress with the above lifestyle modifications. Jasmine [name] agrees to continue taking Benicar and will watch for signs of hypotension as she continues her lifestyle modifications. Jasmine [name] agrees to follow up with our clinic in 2 weeks.  Cardiovascular risk counselling Jasmine [name] was given extended (15 minutes) coronary artery disease prevention counseling today. She is 53 y.o. female and has risk factors for heart disease including obesity, diabetes II, and hypertension. We discussed intensive lifestyle modifications today with an emphasis on specific weight loss instructions and strategies. Pt was also informed of the importance of increasing exercise and decreasing saturated fats to help prevent heart disease.  Obesity Jasmine [name] is currently in the action stage of change. As such, her goal is to continue with weight loss efforts She has agreed to follow the Category 2 plan Jasmine [name] has been instructed  to work up to a goal of 150 minutes of combined cardio and strengthening exercise per week for weight loss and overall health benefits. We discussed the following Behavioral Modification Strategies today: increasing lean protein intake, increasing vegetables, work on meal planning and easy cooking plans, and planning for success   Jasmine [name] has agreed to follow up with our clinic in 2 weeks. She was informed of the importance of frequent follow up visits to maximize her success with intensive lifestyle modifications for her multiple health conditions.  ALLERGIES: Allergies  Allergen Reactions  . Eggs Or Egg-Derived Products Hives and Other (See Comments)    FLU VACCINE: caused hives    . Influenza Vaccines Hives    Vaccine from Egg derived product causes Hives  . Effexor [Venlafaxine]     "felt weird"  . Metformin And Related Swelling    ANGIOEDEMA    MEDICATIONS: Current Outpatient Medications on File Prior to Visit  Medication Sig Dispense Refill  . blood glucose meter kit and supplies Dispense based on patient and insurance preference. Use up to three times daily as directed. (FOR ICD-10 E10.9, E11.9). 1 each 0  . buPROPion (WELLBUTRIN XL) 300 MG 24 hr tablet Take 1 tablet (300 mg total) by mouth daily. 90 tablet 0  . dapagliflozin propanediol (FARXIGA) 10 MG TABS tablet Take 10 mg by mouth daily. 90 tablet 1  . gabapentin (NEURONTIN) 300 MG capsule Take 1 capsule (300 mg total) by mouth 2 (two) times daily. 60 capsule 1  . olmesartan-hydrochlorothiazide (BENICAR HCT) 20-12.5 MG tablet Take 1 tablet by mouth daily. 90 tablet 1  . simvastatin (ZOCOR) 10 MG tablet Take 1 tablet (10 mg total) by mouth daily. 90 tablet 4  . Topiramate ER (TROKENDI XR) 50 MG CP24 Take 1 tablet by mouth daily. 90 capsule 1  . TRULICITY 1.5 GY/6.5LD SOPN INJECT 1.5 MG INTO THE SKIN ONCE A WEEK. 6 pen 1  . Vitamin D, Ergocalciferol, (DRISDOL) 1.25 MG (50000 UT) CAPS capsule Take 1 capsule (50,000 Units total)  by mouth every 7 (seven) days. 4 capsule 0  . [DISCONTINUED] DULoxetine (CYMBALTA) 20 MG capsule Take 1 capsule (20 mg total) by mouth daily. 90 capsule 1   No current facility-administered medications on file prior to visit.     PAST MEDICAL HISTORY: Past Medical History:  Diagnosis Date  . Anxiety   . Arthritis    "knees" (03/28/2014)  . Chronic renal insufficiency    stage II (mild)  . Depression   . Headache    with high blood sugar  . Hypercholesteremia   . Hypertension   . Numbness and tingling of right arm and leg   . Seasonal allergies   . Type II diabetes mellitus ([place])   . Umbilical hernia without mention of obstruction or gangrene   . Wears glasses     PAST SURGICAL HISTORY: Past Surgical History:  Procedure Laterality Date  . INSERTION OF MESH N/A 03/28/2014   Procedure: INSERTION OF MESH;  Surgeon: Ralene Ok, MD;  Location: [facility];  Service: General;  Laterality: N/A;  . INSERTION OF MESH N/A 05/08/2015   Procedure: INSERTION OF MESH;  Surgeon: Ralene Ok, MD;  Location: [facility];  Service: General;  Laterality: N/A;  . LAPAROSCOPIC LYSIS OF ADHESIONS N/A 05/08/2015  Procedure: LAPAROSCOPIC LYSIS OF ADHESIONS;  Surgeon: Ralene Ok, MD;  Location: [facility];  Service: General;  Laterality: N/A;  . MULTIPLE TOOTH EXTRACTIONS  ~ 2003  . TONSILLECTOMY    . [place]  . VENTRAL HERNIA REPAIR N/A 03/28/2014   Procedure: LAPAROSCOPIC VENTRAL HERNIA;  Surgeon: Ralene Ok, MD;  Location: [facility];  Service: General;  Laterality: N/A;  . VENTRAL HERNIA REPAIR N/A 05/08/2015   Procedure: LAPAROSCOPIC VENTRAL HERNIA REPAIR WITH MESH;  Surgeon: Ralene Ok, MD;  Location: [facility];  Service: General;  Laterality: N/A;    SOCIAL HISTORY: Social History   Tobacco Use  . Smoking status: Former Smoker    Packs/day: 0.25    Years: 26.00    Pack years: 6.50    Types: Cigarettes  . Smokeless tobacco: Never Used  Substance Use Topics  . Alcohol  use: Yes    Comment: "have a drink a few times/year"  . Drug use: No    FAMILY HISTORY: Family History  Problem Relation Age of Onset  . Diabetes Mother   . High Cholesterol Mother   . Obesity Mother   . Diabetes Father   . Cancer Father        Prostate cancer  . Cancer Sister        Cervical cancer  . Diabetes Brother   . Bipolar disorder Brother   . Alcohol abuse Brother   . Drug abuse Brother   . Diabetes Sister   . Diabetes Brother     ROS: Review of Systems  Constitutional: Positive for weight loss.  Cardiovascular: Negative for chest pain.       Negative chest pressure  Neurological: Negative for headaches.  Endo/Heme/Allergies:       Negative hypoglycemia    PHYSICAL EXAM: Blood pressure 122/75, pulse 82, temperature 98.3 F (36.8 C), temperature source Oral, height 5' 2"  (1.575 m), weight 259 lb (117.5 kg), SpO2 98 %. Body mass index is 47.37 kg/m. Physical Exam Vitals signs reviewed.  Constitutional:      Appearance: Normal appearance. She is obese.  Cardiovascular:     Rate and Rhythm: Normal rate.     Pulses: Normal pulses.  Pulmonary:     Effort: Pulmonary effort is normal.  Musculoskeletal: Normal range of motion.  Skin:    General: Skin is warm and dry.  Neurological:     Mental Status: She is alert and oriented to person, place, and time.  Psychiatric:        Mood and Affect: Mood normal.        Behavior: Behavior normal.     RECENT LABS AND TESTS: BMET    Component Value Date/Time   NA 138 06/14/2018 1101   K 5.0 06/14/2018 1101   CL 101 06/14/2018 1101   CO2 21 06/14/2018 1101   GLUCOSE 179 (H) 06/14/2018 1101   GLUCOSE 96 09/09/2017 1430   BUN 19 06/14/2018 1101   CREATININE 1.56 (H) 06/14/2018 1101   CREATININE 1.50 (H) 09/09/2017 1430   CALCIUM 10.2 06/14/2018 1101   GFRNONAA 38 (L) 06/14/2018 1101   GFRNONAA 40 (L) 09/09/2017 1430   GFRAA 44 (L) 06/14/2018 1101   GFRAA 47 (L) 09/09/2017 1430   Lab Results  Component  Value Date   HGBA1C 10.8 (H) 06/14/2018   HGBA1C 7.6 (A) 03/08/2018   HGBA1C 7.7 (H) 10/07/2017   HGBA1C 8.4 07/12/2017   HGBA1C 7.1 04/07/2017   Lab Results  Component Value Date  INSULIN 20.8 06/14/2018   INSULIN 24.5 10/07/2017   CBC    Component Value Date/Time   WBC 5.0 10/07/2017 1146   WBC 4.7 11/26/2016 1134   RBC 5.46 (H) 10/07/2017 1146   RBC 5.60 (H) 11/26/2016 1134   HGB 12.8 10/07/2017 1146   HCT 39.3 10/27/2017 1128   PLT 288 11/26/2016 1134   MCV 72 (L) 10/07/2017 1146   MCH 23.4 (L) 10/07/2017 1146   MCH 23.2 (L) 11/26/2016 1134   MCHC 32.4 10/07/2017 1146   MCHC 31.5 (L) 11/26/2016 1134   RDW 16.5 (H) 10/07/2017 1146   LYMPHSABS 2.8 10/07/2017 1146   MONOABS 300 08/19/2016 1401   EOSABS 0.2 10/07/2017 1146   BASOSABS 0.1 10/07/2017 1146   Iron/TIBC/Ferritin/ %Sat    Component Value Date/Time   IRON 76 10/27/2017 1128   TIBC 375 10/27/2017 1128   FERRITIN 48 10/27/2017 1128   IRONPCTSAT 20 10/27/2017 1128   Lipid Panel     Component Value Date/Time   CHOL 174 06/14/2018 1101   TRIG 123 06/14/2018 1101   HDL 46 06/14/2018 1101   CHOLHDL 4 09/09/2017 1430   VLDL 41.4 (H) 09/09/2017 1430   LDLCALC 103 (H) 06/14/2018 1101   LDLDIRECT 103.0 09/09/2017 1430   Hepatic Function Panel     Component Value Date/Time   PROT 7.5 06/14/2018 1101   ALBUMIN 4.8 06/14/2018 1101   AST 14 06/14/2018 1101   ALT 17 06/14/2018 1101   ALKPHOS 89 06/14/2018 1101   BILITOT 0.2 06/14/2018 1101      Component Value Date/Time   TSH 1.230 10/07/2017 1146   TSH 1.36 11/26/2016 1134   TSH 0.860 02/08/2015 0945      OBESITY BEHAVIORAL INTERVENTION VISIT  Today's visit was # 11   Starting weight: 263 lbs Starting date: 10/07/17 Today's weight : 259 lbs  Today's date: 08/16/2018 Total lbs lost to date: 4    ASK: We discussed the diagnosis of obesity with [name] today and Stephani agreed to give [place] permission to discuss obesity behavioral  modification therapy today.  ASSESS: Mimie has the diagnosis of obesity and her BMI today is 47.36 Lynnea is in the action stage of change   ADVISE: Amna was educated on the multiple health risks of obesity as well as the benefit of weight loss to improve her health. She was advised of the need for long term treatment and the importance of lifestyle modifications to improve her current health and to decrease her risk of future health problems.  AGREE: Multiple dietary modification options and treatment options were discussed and  Ivelise agreed to follow the recommendations documented in the above note.  ARRANGE: Chastelyn was educated on the importance of frequent visits to treat obesity as outlined per CMS and USPSTF guidelines and agreed to schedule her next follow up appointment today.  I, Trixie Dredge, am acting as transcriptionist for Ilene Qua, MD  I have reviewed the above documentation for accuracy and completeness, and I agree with the above. - Ilene Qua, MD

## 2018-08-18 ENCOUNTER — Telehealth: Payer: Self-pay

## 2018-08-18 NOTE — Telephone Encounter (Signed)
Error

## 2018-08-18 NOTE — Telephone Encounter (Signed)
Mailed [employer] Job Accommodation paperwork to pt's address.

## 2018-08-19 ENCOUNTER — Ambulatory Visit (INDEPENDENT_AMBULATORY_CARE_PROVIDER_SITE_OTHER): Payer: 59 | Admitting: Psychiatry

## 2018-08-19 ENCOUNTER — Encounter (HOSPITAL_COMMUNITY): Payer: Self-pay | Admitting: Psychiatry

## 2018-08-19 VITALS — HR 84 | Ht 62.0 in | Wt 258.0 lb

## 2018-08-19 DIAGNOSIS — F411 Generalized anxiety disorder: Secondary | ICD-10-CM

## 2018-08-19 DIAGNOSIS — F322 Major depressive disorder, single episode, severe without psychotic features: Secondary | ICD-10-CM | POA: Diagnosis not present

## 2018-08-19 DIAGNOSIS — F4310 Post-traumatic stress disorder, unspecified: Secondary | ICD-10-CM | POA: Diagnosis not present

## 2018-08-19 NOTE — Progress Notes (Signed)
[facility] Outpatient Follow up visit   Patient Identification: Jasmine [name] MRN:  [id number] Date of Evaluation:  08/19/2018 Referral Source: Her therapist [place] Records  Chief Complaint:   Chief Complaint    Follow-up    I am not doing good.  Visit Diagnosis:    ICD-10-CM   1. Severe major depression, single episode, without psychotic features ([place]) F32.2   2. Generalized anxiety disorder F41.1   3. PTSD (post-traumatic stress disorder) F43.10   4. Anxiety state F41.1     History of Present Illness: Jasmine [name] is 53 year old African-American married, She has been part of IOP in past and continues therapy with Judson Roch  Has been off work she has changed her therapist.  Last visit gabapentin was increased to twice a day.  Remains overwhelmed because of dealing with her husband was crossed over the holiday season.  Her husband sister is going to take care of him or move him to [place] next week patient feels that will help her since she is going to have more be time and recover from her depression and stress she has been caregiving for him and that has added overwhelmed anxiety.  Her new therapist also working on some more goals and homework that is trying to help her cope  Tolerating meds, Works at [employer] and has gone thru some harrassment concerns in the past. Still overwhelemed at home Gets anxious and stressed around people and caregiving has added to her depression  Aggravating factor: husband sickness,job stress  Modifying factor: some friends  Past Psychiatric History: Patient denies any history of psychiatric inpatient treatment, suicidal attempt, self abusive behavior, traumatic brain injury or any history of abuse.  Previous Psychotropic Medications: Yes   Substance Abuse History in the last 12 months:  No.  Consequences of Substance Abuse: Negative  Past Medical History:  Past Medical History:  Diagnosis Date  . Anxiety   . Arthritis    "knees" (03/28/2014)  .  Chronic renal insufficiency    stage II (mild)  . Depression   . Headache    with high blood sugar  . Hypercholesteremia   . Hypertension   . Numbness and tingling of right arm and leg   . Seasonal allergies   . Type II diabetes mellitus ([place])   . Umbilical hernia without mention of obstruction or gangrene   . Wears glasses     Past Surgical History:  Procedure Laterality Date  . INSERTION OF MESH N/A 03/28/2014   Procedure: INSERTION OF MESH;  Surgeon: Ralene Ok, MD;  Location: [facility];  Service: General;  Laterality: N/A;  . INSERTION OF MESH N/A 05/08/2015   Procedure: INSERTION OF MESH;  Surgeon: Ralene Ok, MD;  Location: [facility];  Service: General;  Laterality: N/A;  . LAPAROSCOPIC LYSIS OF ADHESIONS N/A 05/08/2015   Procedure: LAPAROSCOPIC LYSIS OF ADHESIONS;  Surgeon: Ralene Ok, MD;  Location: [facility];  Service: General;  Laterality: N/A;  . MULTIPLE TOOTH EXTRACTIONS  ~ 2003  . TONSILLECTOMY    . [place]  . VENTRAL HERNIA REPAIR N/A 03/28/2014   Procedure: LAPAROSCOPIC VENTRAL HERNIA;  Surgeon: Ralene Ok, MD;  Location: [facility];  Service: General;  Laterality: N/A;  . VENTRAL HERNIA REPAIR N/A 05/08/2015   Procedure: LAPAROSCOPIC VENTRAL HERNIA REPAIR WITH MESH;  Surgeon: Ralene Ok, MD;  Location: [facility];  Service: General;  Laterality: N/A;    Family Psychiatric History: Viewed  Family History:  Family History  Problem Relation Age  of Onset  . Diabetes Mother   . High Cholesterol Mother   . Obesity Mother   . Diabetes Father   . Cancer Father        Prostate cancer  . Cancer Sister        Cervical cancer  . Diabetes Brother   . Bipolar disorder Brother   . Alcohol abuse Brother   . Drug abuse Brother   . Diabetes Sister   . Diabetes Brother     Social History:   Social History   Socioeconomic History  . Marital status: Married    Spouse name: Stephen Dawkins  . Number of children: 1  . Years of education: Not on  file  . Highest education level: Not on file  Occupational History  . Not on file  Social Needs  . Financial resource strain: Not on file  . Food insecurity:    Worry: Not on file    Inability: Not on file  . Transportation needs:    Medical: Not on file    Non-medical: Not on file  Tobacco Use  . Smoking status: Former Smoker    Packs/day: 0.25    Years: 26.00    Pack years: 6.50    Types: Cigarettes  . Smokeless tobacco: Never Used  Substance and Sexual Activity  . Alcohol use: Yes    Comment: "have a drink a few times/year"  . Drug use: No  . Sexual activity: Not Currently  Lifestyle  . Physical activity:    Days per week: Not on file    Minutes per session: Not on file  . Stress: Not on file  Relationships  . Social connections:    Talks on phone: Not on file    Gets together: Not on file    Attends religious service: Not on file    Active member of club or organization: Not on file    Attends meetings of clubs or organizations: Not on file    Relationship status: Not on file  Other Topics Concern  . Not on file  Social History Narrative  . Not on file     Allergies:   Allergies  Allergen Reactions  . Eggs Or Egg-Derived Products Hives and Other (See Comments)    FLU VACCINE: caused hives    . Influenza Vaccines Hives    Vaccine from Egg derived product causes Hives  . Effexor [Venlafaxine]     "felt weird"  . Metformin And Related Swelling    ANGIOEDEMA    Metabolic Disorder Labs: Lab Results  Component Value Date   HGBA1C 10.8 (H) 06/14/2018   MPG 384 (H) 01/09/2015   MPG 160 (H) 03/28/2014   No results found for: PROLACTIN Lab Results  Component Value Date   CHOL 174 06/14/2018   TRIG 123 06/14/2018   HDL 46 06/14/2018   CHOLHDL 4 09/09/2017   VLDL 41.4 (H) 09/09/2017   LDLCALC 103 (H) 06/14/2018   LDLCALC 98 10/07/2017     Current Medications: Current Outpatient Medications  Medication Sig Dispense Refill  . blood glucose meter  kit and supplies Dispense based on patient and insurance preference. Use up to three times daily as directed. (FOR ICD-10 E10.9, E11.9). 1 each 0  . buPROPion (WELLBUTRIN XL) 300 MG 24 hr tablet Take 1 tablet (300 mg total) by mouth daily. 90 tablet 0  . dapagliflozin propanediol (FARXIGA) 10 MG TABS tablet Take 10 mg by mouth daily. 90 tablet 1  . gabapentin (NEURONTIN) 300   MG capsule Take 1 capsule (300 mg total) by mouth 2 (two) times daily. 60 capsule 1  . insulin degludec (TRESIBA FLEXTOUCH) 100 UNIT/ML SOPN FlexTouch Pen Inject 0.55 mLs (55 Units total) into the skin daily. 15 pen 0  . olmesartan-hydrochlorothiazide (BENICAR HCT) 20-12.5 MG tablet Take 1 tablet by mouth daily. 90 tablet 1  . sertraline (ZOLOFT) 100 MG tablet Take one and one/half tablet daily. 135 tablet 0  . simvastatin (ZOCOR) 10 MG tablet Take 1 tablet (10 mg total) by mouth daily. 90 tablet 4  . Topiramate ER (TROKENDI XR) 50 MG CP24 Take 1 tablet by mouth daily. 90 capsule 1  . TRULICITY 1.5 MG/0.5ML SOPN INJECT 1.5 MG INTO THE SKIN ONCE A WEEK. 6 pen 1  . Vitamin D, Ergocalciferol, (DRISDOL) 1.25 MG (50000 UT) CAPS capsule Take 1 capsule (50,000 Units total) by mouth every 7 (seven) days. 4 capsule 0   No current facility-administered medications for this visit.       Psychiatric Specialty Exam: Review of Systems  Cardiovascular: Negative for chest pain.  Skin: Negative for rash.  Psychiatric/Behavioral: Positive for hallucinations.    Pulse 84, height 5' 2" (1.575 m), weight 258 lb (117 kg).Body mass index is 47.19 kg/m.  General Appearance: Casual and Obese  Eye Contact:  Fair  Speech:  Slow  Volume:  Decreased  Mood: stressed  Affect:anxious  Thought Process:  Goal Directed  Orientation:  Full (Time, Place, and Person)  Thought Content:  Rumination  Suicidal Thoughts:  No  Homicidal Thoughts:  No  Memory:  Immediate;   Fair Recent;   Fair Remote;   Fair  Judgement:  Good  Insight:  Good   Psychomotor Activity:  Decreased  Concentration:  Concentration: Fair and Attention Span: Fair  Recall:  Good  Fund of Knowledge:Good  Language: Good  Akathisia:  No  Handed:  Right  AIMS (if indicated):  0  Assets:  Communication Skills Desire for Improvement Housing  ADL's:  Intact  Cognition: WNL  Sleep:  fair    Treatment Plan Summary: Major depressive disorder, recurrent severe:  Remains overwhelemed for now . Husband may move to his sister place next week. She feels that would help her recover from caregiving stress Continue gabapentin and zoloft   Generalized anxiety disorder. Ongoing related to stress. New therapist working on goals and coping skills Patient feels she may start feeling some better once husband has moved and she may have me time  Continue zoloft Panic attacks :sporadic continue zoloft  Recommend she remain off work for next 3 weeks while she works in therapy. Husband moving out in one week and while she recovers from caregiving stress.  Work may further decompensate her for now. Reviewed meds, questions addressed   Nadeem Akhtar, MD 1/10/202010:24 AM 

## 2018-08-24 ENCOUNTER — Encounter (INDEPENDENT_AMBULATORY_CARE_PROVIDER_SITE_OTHER): Payer: Self-pay | Admitting: Family Medicine

## 2018-08-30 ENCOUNTER — Encounter (INDEPENDENT_AMBULATORY_CARE_PROVIDER_SITE_OTHER): Payer: Self-pay

## 2018-08-30 ENCOUNTER — Ambulatory Visit (INDEPENDENT_AMBULATORY_CARE_PROVIDER_SITE_OTHER): Payer: 59 | Admitting: Family Medicine

## 2018-09-07 LAB — HM MAMMOGRAPHY

## 2018-09-12 ENCOUNTER — Ambulatory Visit (HOSPITAL_COMMUNITY): Payer: PRIVATE HEALTH INSURANCE | Admitting: Psychiatry

## 2018-09-14 ENCOUNTER — Ambulatory Visit (INDEPENDENT_AMBULATORY_CARE_PROVIDER_SITE_OTHER): Payer: PRIVATE HEALTH INSURANCE | Admitting: Psychiatry

## 2018-09-14 ENCOUNTER — Encounter (HOSPITAL_COMMUNITY): Payer: Self-pay | Admitting: Psychiatry

## 2018-09-14 ENCOUNTER — Other Ambulatory Visit: Payer: Self-pay

## 2018-09-14 VITALS — BP 120/80 | HR 86 | Ht 62.0 in | Wt 255.0 lb

## 2018-09-14 DIAGNOSIS — F411 Generalized anxiety disorder: Secondary | ICD-10-CM

## 2018-09-14 DIAGNOSIS — F4310 Post-traumatic stress disorder, unspecified: Secondary | ICD-10-CM

## 2018-09-14 DIAGNOSIS — F322 Major depressive disorder, single episode, severe without psychotic features: Secondary | ICD-10-CM | POA: Diagnosis not present

## 2018-09-14 NOTE — Progress Notes (Signed)
[facility] Outpatient Follow up visit   Patient Identification: Jasmine [name] MRN:  [id number] Date of Evaluation:  09/14/2018 Referral Source: Her therapist [facility]  Chief Complaint:   Chief Complaint    Follow-up; Other    I am not doing good.  Visit Diagnosis:    ICD-10-CM   1. Severe major depression, single episode, without psychotic features ([place]) F32.2   2. Generalized anxiety disorder F41.1   3. PTSD (post-traumatic stress disorder) F43.10   4. Anxiety state F41.1     History of Present Illness: [name] is 53 -year-old African-American married, She has been part of IOP in past and continues therapy at [facility] for follow-up.  Her husband was a chronic illness fell during the Christmas time and he had flu remains sick and weak he was not able to be taken by his sister so patient is still caregiving for him he has an appointment at [facility] December 13.  Patient is trying options and talking to social workers about possible rehab or physical assistance programs  Patient is stressed and dysphoric due to caregiving it affects her daily life and also keeps her distracted and anxious  She believes the medication does help but the external factors of stress dealing with her husband adds to the chronicity and effects her mood.   Her new therapist also working on some more goals and homework that is trying to help her cope  Works at [employer] and has gone thru some harrassment concerns in the past. Still overwhelemed at home Gets anxious and stressed around people and caregiving has been an added stress.   Aggravating factor: husband sickness,job stress per history Modifying factor: some friends  Past Psychiatric History: Patient denies any history of psychiatric inpatient treatment, suicidal attempt, self abusive behavior, traumatic brain injury or any history of abuse.  Previous Psychotropic Medications: Yes   Substance Abuse History in the last 12 months:   No.  Consequences of Substance Abuse: Negative  Past Medical History:  Past Medical History:  Diagnosis Date  . Anxiety   . Arthritis    "knees" (03/28/2014)  . Chronic renal insufficiency    stage II (mild)  . Depression   . Headache    with high blood sugar  . Hypercholesteremia   . Hypertension   . Numbness and tingling of right arm and leg   . Seasonal allergies   . Type II diabetes mellitus ([place])   . Umbilical hernia without mention of obstruction or gangrene   . Wears glasses     Past Surgical History:  Procedure Laterality Date  . INSERTION OF MESH N/A 03/28/2014   Procedure: INSERTION OF MESH;  Surgeon: Ralene Ok, MD;  Location: [place];  Service: General;  Laterality: N/A;  . INSERTION OF MESH N/A 05/08/2015   Procedure: INSERTION OF MESH;  Surgeon: Ralene Ok, MD;  Location: [place];  Service: General;  Laterality: N/A;  . LAPAROSCOPIC LYSIS OF ADHESIONS N/A 05/08/2015   Procedure: LAPAROSCOPIC LYSIS OF ADHESIONS;  Surgeon: Ralene Ok, MD;  Location: [place];  Service: General;  Laterality: N/A;  . MULTIPLE TOOTH EXTRACTIONS  ~ 2003  . TONSILLECTOMY    . [place]  . VENTRAL HERNIA REPAIR N/A 03/28/2014   Procedure: LAPAROSCOPIC VENTRAL HERNIA;  Surgeon: Ralene Ok, MD;  Location: [place];  Service: General;  Laterality: N/A;  . VENTRAL HERNIA REPAIR N/A 05/08/2015   Procedure: LAPAROSCOPIC VENTRAL HERNIA REPAIR WITH MESH;  Surgeon: Ralene Ok,  MD;  [facility];  Service: General;  Laterality: N/A;    Family Psychiatric History: Viewed  Family History:  Family History  Problem Relation Age of Onset  . Diabetes Mother   . High Cholesterol Mother   . Obesity Mother   . Diabetes Father   . Cancer Father        Prostate cancer  . Cancer Sister        Cervical cancer  . Diabetes Brother   . Bipolar disorder Brother   . Alcohol abuse Brother   . Drug abuse Brother   . Diabetes Sister   . Diabetes Brother     Social  History:   Social History   Socioeconomic History  . Marital status: Married    Spouse name: [name]  . Number of children: 1  . Years of education: Not on file  . Highest education level: Not on file  Occupational History  . Not on file  Social Needs  . Financial resource strain: Not on file  . Food insecurity:    Worry: Not on file    Inability: Not on file  . Transportation needs:    Medical: Not on file    Non-medical: Not on file  Tobacco Use  . Smoking status: Former Smoker    Packs/day: 0.25    Years: 26.00    Pack years: 6.50    Types: Cigarettes  . Smokeless tobacco: Never Used  Substance and Sexual Activity  . Alcohol use: Yes    Comment: "have a drink a few times/year"  . Drug use: No  . Sexual activity: Not Currently  Lifestyle  . Physical activity:    Days per week: Not on file    Minutes per session: Not on file  . Stress: Not on file  Relationships  . Social connections:    Talks on phone: Not on file    Gets together: Not on file    Attends religious service: Not on file    Active member of club or organization: Not on file    Attends meetings of clubs or organizations: Not on file    Relationship status: Not on file  Other Topics Concern  . Not on file  Social History Narrative  . Not on file     Allergies:   Allergies  Allergen Reactions  . Eggs Or Egg-Derived Products Hives and Other (See Comments)    FLU VACCINE: caused hives    . Influenza Vaccines Hives    Vaccine from Egg derived product causes Hives  . Effexor [Venlafaxine]     "felt weird"  . Metformin And Related Swelling    ANGIOEDEMA    Metabolic Disorder Labs: Lab Results  Component Value Date   HGBA1C 10.8 (H) 06/14/2018   MPG 384 (H) 01/09/2015   MPG 160 (H) 03/28/2014   No results found for: PROLACTIN Lab Results  Component Value Date   CHOL 174 06/14/2018   TRIG 123 06/14/2018   HDL 46 06/14/2018   CHOLHDL 4 09/09/2017   VLDL 41.4 (H) 09/09/2017    LDLCALC 103 (H) 06/14/2018   LDLCALC 98 10/07/2017     Current Medications: Current Outpatient Medications  Medication Sig Dispense Refill  . blood glucose meter kit and supplies Dispense based on patient and insurance preference. Use up to three times daily as directed. (FOR ICD-10 E10.9, E11.9). 1 each 0  . buPROPion (WELLBUTRIN XL) 300 MG 24 hr tablet Take 1 tablet (300 mg total) by  mouth daily. 90 tablet 0  . dapagliflozin propanediol (FARXIGA) 10 MG TABS tablet Take 10 mg by mouth daily. 90 tablet 1  . gabapentin (NEURONTIN) 300 MG capsule Take 1 capsule (300 mg total) by mouth 2 (two) times daily. 60 capsule 1  . insulin degludec (TRESIBA FLEXTOUCH) 100 UNIT/ML SOPN FlexTouch Pen Inject 0.55 mLs (55 Units total) into the skin daily. 15 pen 0  . olmesartan-hydrochlorothiazide (BENICAR HCT) 20-12.5 MG tablet Take 1 tablet by mouth daily. 90 tablet 1  . sertraline (ZOLOFT) 100 MG tablet Take one and one/half tablet daily. 135 tablet 0  . simvastatin (ZOCOR) 10 MG tablet Take 1 tablet (10 mg total) by mouth daily. 90 tablet 4  . Topiramate ER (TROKENDI XR) 50 MG CP24 Take 1 tablet by mouth daily. 90 capsule 1  . TRULICITY 1.5 ZY/6.0YT SOPN INJECT 1.5 MG INTO THE SKIN ONCE A WEEK. 6 pen 1  . Vitamin D, Ergocalciferol, (DRISDOL) 1.25 MG (50000 UT) CAPS capsule Take 1 capsule (50,000 Units total) by mouth every 7 (seven) days. 4 capsule 0   No current facility-administered medications for this visit.       Psychiatric Specialty Exam: Review of Systems  Cardiovascular: Negative for chest pain.  Skin: Negative for rash.  Neurological: Negative for tremors.  Psychiatric/Behavioral: Positive for depression.    Blood pressure 120/80, pulse 86, height _0  (1.575 m), weight 255 lb (115.7 kg).Body mass index is 46.64 kg/m.  General Appearance: Casual and Obese  Eye Contact:  Fair  Speech:  Slow  Volume:  Decreased  Mood: stressed  Affect:anxious  Thought Process:  Goal Directed   Orientation:  Full (Time, Place, and Person)  Thought Content:  Rumination  Suicidal Thoughts:  No  Homicidal Thoughts:  No  Memory:  Immediate;   Fair Recent;   Fair Remote;   Fair  Judgement:  Good  Insight:  Good  Psychomotor Activity:  Decreased  Concentration:  Concentration: Fair and Attention Span: Fair  Recall:  Good  Fund of Knowledge:Good  Language: Good  Akathisia:  No  Handed:  Right  AIMS (if indicated):  0  Assets:  Communication Skills Desire for Improvement Housing  ADL's:  Intact  Cognition: WNL  Sleep:  fair    Treatment Plan Summary: Major depressive disorder, recurrent severe:  Remains overwhelemed, husband has appointment in [place] next week. She will discuss options for his help.  Continue meds and therapy. Remain off work, possible join 4 hours of work from February 17th.  Generalized anxiety : ongoing. Continue zoloft, work on [employer], therapeutic, working to find some placement or care giving for her husband  Panic attacks :sporadic continue zoloft  Work may further decompensate her for now.  Still care giving with no added help for her husband. Reviewed meds, questions addressed   Merian Capron, MD 2/5/202010:12 AM

## 2018-09-20 ENCOUNTER — Other Ambulatory Visit: Payer: Self-pay | Admitting: Physician Assistant

## 2018-09-20 DIAGNOSIS — Z794 Long term (current) use of insulin: Principal | ICD-10-CM

## 2018-09-20 DIAGNOSIS — E1122 Type 2 diabetes mellitus with diabetic chronic kidney disease: Secondary | ICD-10-CM

## 2018-09-29 ENCOUNTER — Encounter: Payer: Self-pay | Admitting: Physician Assistant

## 2018-10-05 ENCOUNTER — Encounter (HOSPITAL_COMMUNITY): Payer: Self-pay | Admitting: Psychiatry

## 2018-10-05 ENCOUNTER — Ambulatory Visit (INDEPENDENT_AMBULATORY_CARE_PROVIDER_SITE_OTHER): Payer: PRIVATE HEALTH INSURANCE | Admitting: Psychiatry

## 2018-10-05 ENCOUNTER — Other Ambulatory Visit: Payer: Self-pay

## 2018-10-05 VITALS — BP 124/84 | HR 97 | Ht 62.0 in | Wt 253.0 lb

## 2018-10-05 DIAGNOSIS — F322 Major depressive disorder, single episode, severe without psychotic features: Secondary | ICD-10-CM

## 2018-10-05 DIAGNOSIS — F411 Generalized anxiety disorder: Secondary | ICD-10-CM

## 2018-10-05 DIAGNOSIS — F4310 Post-traumatic stress disorder, unspecified: Secondary | ICD-10-CM

## 2018-10-05 MED ORDER — GABAPENTIN 300 MG PO CAPS: 300.0000 mg | ORAL_CAPSULE | Freq: Two times a day (BID) | ORAL | 1 refills | Status: DC

## 2018-10-05 MED ORDER — BUPROPION HCL ER (XL) 300 MG PO TB24: 300.0000 mg | ORAL_TABLET | Freq: Every day | ORAL | 0 refills | Status: DC

## 2018-10-05 NOTE — Progress Notes (Signed)
[facility] Outpatient Follow up visit   Patient Identification: Jasmine [name] MRN:  [id number] Date of Evaluation:  10/05/2018 Referral Source: Her therapist [facility] Records  Chief Complaint:   Chief Complaint    Follow-up; Other    I am not doing good.  Visit Diagnosis:    ICD-10-CM   1. Severe major depression, single episode, without psychotic features ([place]) F32.2   2. Generalized anxiety disorder F41.1   3. PTSD (post-traumatic stress disorder) F43.10     History of Present Illness: Tienna is 53 -year-old African-American married, She has been part of IOP in past and continues therapy at [employer] for follow-up.  Her husband was a chronic illness fell during the Christmas time and he had flu remains sick and weak he was not able to be taken by his sister so patient is still caregiving for him he has an appointment at [facility] December 13.  Patient is trying options and talking to social workers about possible rehab or physical assistance programs Patient has had some relief because her husband was at his brother's place now he is back also been evaluated at [facility] and some examinations or testing are being done Still remains stressed but somewhat better tolerating medication some concern about how will she be able to perform at work starting next week. P She believes the medication does help but the external factors of stress dealing with her husband adds to the chronicity and effects her mood.   Her new therapist also working on some more goals and homework that is trying to help her cope  Works at [employer] and has gone thru some harrassment concerns in the past.   Aggravating factor: husband sickness,job stress per history Modifying factor: some friends  Past Psychiatric History: Patient denies any history of psychiatric inpatient treatment, suicidal attempt, self abusive behavior, traumatic brain injury or any history of abuse.  Previous Psychotropic Medications: Yes    Substance Abuse History in the last 12 months:  No.  Consequences of Substance Abuse: Negative  Past Medical History:  Past Medical History:  Diagnosis Date  . Anxiety   . Arthritis    "knees" (03/28/2014)  . Chronic renal insufficiency    stage II (mild)  . Depression   . Headache    with high blood sugar  . Hypercholesteremia   . Hypertension   . Numbness and tingling of right arm and leg   . Seasonal allergies   . Type II diabetes mellitus ([place])   . Umbilical hernia without mention of obstruction or gangrene   . Wears glasses     Past Surgical History:  Procedure Laterality Date  . INSERTION OF MESH N/A 03/28/2014   Procedure: INSERTION OF MESH;  Surgeon: Ralene Ok, MD;  Location: [place];  Service: General;  Laterality: N/A;  . INSERTION OF MESH N/A 05/08/2015   Procedure: INSERTION OF MESH;  Surgeon: Ralene Ok, MD;  Location: [place];  Service: General;  Laterality: N/A;  . LAPAROSCOPIC LYSIS OF ADHESIONS N/A 05/08/2015   Procedure: LAPAROSCOPIC LYSIS OF ADHESIONS;  Surgeon: Ralene Ok, MD;  Location: [place];  Service: General;  Laterality: N/A;  . MULTIPLE TOOTH EXTRACTIONS  ~ 2003  . TONSILLECTOMY    . [place]  . VENTRAL HERNIA REPAIR N/A 03/28/2014   Procedure: LAPAROSCOPIC VENTRAL HERNIA;  Surgeon: Ralene Ok, MD;  Location: [place];  Service: General;  Laterality: N/A;  . VENTRAL HERNIA REPAIR N/A 05/08/2015   Procedure: LAPAROSCOPIC  VENTRAL HERNIA REPAIR WITH MESH;  Surgeon: Ralene Ok, MD;  Location: [facility];  Service: General;  Laterality: N/A;    Family Psychiatric History: Viewed  Family History:  Family History  Problem Relation Age of Onset  . Diabetes Mother   . High Cholesterol Mother   . Obesity Mother   . Diabetes Father   . Cancer Father        Prostate cancer  . Cancer Sister        Cervical cancer  . Diabetes Brother   . Bipolar disorder Brother   . Alcohol abuse Brother   . Drug abuse Brother   .  Diabetes Sister   . Diabetes Brother     Social History:   Social History   Socioeconomic History  . Marital status: Married    Spouse name: [name]  . Number of children: 1  . Years of education: Not on file  . Highest education level: Not on file  Occupational History  . Not on file  Social Needs  . Financial resource strain: Not on file  . Food insecurity:    Worry: Not on file    Inability: Not on file  . Transportation needs:    Medical: Not on file    Non-medical: Not on file  Tobacco Use  . Smoking status: Former Smoker    Packs/day: 0.25    Years: 26.00    Pack years: 6.50    Types: Cigarettes  . Smokeless tobacco: Never Used  Substance and Sexual Activity  . Alcohol use: Yes    Comment: "have a drink a few times/year"  . Drug use: No  . Sexual activity: Not Currently  Lifestyle  . Physical activity:    Days per week: Not on file    Minutes per session: Not on file  . Stress: Not on file  Relationships  . Social connections:    Talks on phone: Not on file    Gets together: Not on file    Attends religious service: Not on file    Active member of club or organization: Not on file    Attends meetings of clubs or organizations: Not on file    Relationship status: Not on file  Other Topics Concern  . Not on file  Social History Narrative  . Not on file     Allergies:   Allergies  Allergen Reactions  . Eggs Or Egg-Derived Products Hives and Other (See Comments)    FLU VACCINE: caused hives    . Influenza Vaccines Hives    Vaccine from Egg derived product causes Hives  . Effexor [Venlafaxine]     "felt weird"  . Metformin And Related Swelling    ANGIOEDEMA    Metabolic Disorder Labs: Lab Results  Component Value Date   HGBA1C 10.8 (H) 06/14/2018   MPG 384 (H) 01/09/2015   MPG 160 (H) 03/28/2014   No results found for: PROLACTIN Lab Results  Component Value Date   CHOL 174 06/14/2018   TRIG 123 06/14/2018   HDL 46 06/14/2018    CHOLHDL 4 09/09/2017   VLDL 41.4 (H) 09/09/2017   LDLCALC 103 (H) 06/14/2018   LDLCALC 98 10/07/2017     Current Medications: Current Outpatient Medications  Medication Sig Dispense Refill  . blood glucose meter kit and supplies Dispense based on patient and insurance preference. Use up to three times daily as directed. (FOR ICD-10 E10.9, E11.9). 1 each 0  . buPROPion (WELLBUTRIN XL) 300 MG 24  hr tablet Take 1 tablet (300 mg total) by mouth daily. 90 tablet 0  . dapagliflozin propanediol (FARXIGA) 10 MG TABS tablet Take 10 mg by mouth daily. Must schedule appt for refills 30 tablet 0  . gabapentin (NEURONTIN) 300 MG capsule Take 1 capsule (300 mg total) by mouth 2 (two) times daily. 60 capsule 1  . insulin degludec (TRESIBA FLEXTOUCH) 100 UNIT/ML SOPN FlexTouch Pen Inject 0.55 mLs (55 Units total) into the skin daily. 15 pen 0  . olmesartan-hydrochlorothiazide (BENICAR HCT) 20-12.5 MG tablet Take 1 tablet by mouth daily. 90 tablet 1  . sertraline (ZOLOFT) 100 MG tablet Take one and one/half tablet daily. 135 tablet 0  . simvastatin (ZOCOR) 10 MG tablet Take 1 tablet (10 mg total) by mouth daily. 90 tablet 4  . Topiramate ER (TROKENDI XR) 50 MG CP24 Take 1 tablet by mouth daily. 90 capsule 1  . TRULICITY 1.5 OZ/2.2QM SOPN INJECT 1.5 MG INTO THE SKIN ONCE A WEEK. 6 pen 1  . Vitamin D, Ergocalciferol, (DRISDOL) 1.25 MG (50000 UT) CAPS capsule Take 1 capsule (50,000 Units total) by mouth every 7 (seven) days. 4 capsule 0   No current facility-administered medications for this visit.       Psychiatric Specialty Exam: Review of Systems  Cardiovascular: Negative for palpitations.  Skin: Negative for rash.  Neurological: Negative for tremors.    Blood pressure 124/84, pulse 97, height 5' 2"  (1.575 m), weight 253 lb (114.8 kg).Body mass index is 46.27 kg/m.  General Appearance: Casual and Obese  Eye Contact:  Fair  Speech:  Slow  Volume:  Decreased  Mood: some better  Affect:some  better  Thought Process:  Goal Directed  Orientation:  Full (Time, Place, and Person)  Thought Content:  Rumination  Suicidal Thoughts:  No  Homicidal Thoughts:  No  Memory:  Immediate;   Fair Recent;   Fair Remote;   Fair  Judgement:  Good  Insight:  Good  Psychomotor Activity:  Decreased  Concentration:  Concentration: Fair and Attention Span: Fair  Recall:  Good  Fund of Knowledge:Good  Language: Good  Akathisia:  No  Handed:  Right  AIMS (if indicated):  0  Assets:  Communication Skills Desire for Improvement Housing  ADL's:  Intact  Cognition: WNL  Sleep:  fair    Treatment Plan Summary: Major depressive disorder, recurrent severe: less overwhelmed, since husband was at brothers place for a while Continue meds and therapy.joining back work next week for 4 hours to prevent destabilization Generalized anxiety :fluctuates. Continue zoloft. Some stress related to how will she do at work  Panic attacks :sporadic continue zoloft  Worried about work stress , will start next week Reviewed meds, questions addressed   Merian Capron, MD 2/26/202010:47 AM

## 2018-10-06 ENCOUNTER — Encounter (INDEPENDENT_AMBULATORY_CARE_PROVIDER_SITE_OTHER): Payer: Self-pay | Admitting: Family Medicine

## 2018-10-19 ENCOUNTER — Ambulatory Visit: Payer: Self-pay | Admitting: Internal Medicine

## 2018-10-23 ENCOUNTER — Other Ambulatory Visit: Payer: Self-pay | Admitting: Physician Assistant

## 2018-10-23 DIAGNOSIS — I10 Essential (primary) hypertension: Secondary | ICD-10-CM

## 2018-11-02 ENCOUNTER — Ambulatory Visit (HOSPITAL_COMMUNITY): Payer: PRIVATE HEALTH INSURANCE | Admitting: Psychiatry

## 2018-11-03 ENCOUNTER — Encounter (HOSPITAL_COMMUNITY): Payer: Self-pay | Admitting: Psychiatry

## 2018-11-03 ENCOUNTER — Other Ambulatory Visit: Payer: Self-pay

## 2018-11-03 ENCOUNTER — Ambulatory Visit (INDEPENDENT_AMBULATORY_CARE_PROVIDER_SITE_OTHER): Payer: 59 | Admitting: Psychiatry

## 2018-11-03 DIAGNOSIS — F4310 Post-traumatic stress disorder, unspecified: Secondary | ICD-10-CM | POA: Diagnosis not present

## 2018-11-03 DIAGNOSIS — Z79899 Other long term (current) drug therapy: Secondary | ICD-10-CM | POA: Diagnosis not present

## 2018-11-03 DIAGNOSIS — F411 Generalized anxiety disorder: Secondary | ICD-10-CM | POA: Diagnosis not present

## 2018-11-03 DIAGNOSIS — F322 Major depressive disorder, single episode, severe without psychotic features: Secondary | ICD-10-CM

## 2018-11-03 DIAGNOSIS — Z566 Other physical and mental strain related to work: Secondary | ICD-10-CM

## 2018-11-03 NOTE — Progress Notes (Signed)
[facility] tele follow up visit   Patient Identification: Jasmine [name] MRN:  [id number] Date of Evaluation:  11/03/2018 Referral Source: Her therapist [facility]  Chief Complaint:   I am not doing good.  Visit Diagnosis:    ICD-10-CM   1. Severe major depression, single episode, without psychotic features ([place]) F32.2   2. Generalized anxiety disorder F41.1   3. PTSD (post-traumatic stress disorder) F43.10   4. Anxiety state F41.1     History of Present Illness: Jasmine [name] is 53 -year-old African-American married, She has been part of IOP in past and continues therapy at [place] office  I connected with [name] on 11/03/18 at 10:45 AM EDT by telephone and verified that I am speaking with the correct person using two identifiers.   I discussed the limitations, risks, security and privacy concerns of performing an evaluation and management service by telephone and the availability of in person appointments. I also discussed with the patient that there may be a patient responsible charge related to this service. The patient expressed understanding and agreed to proceed   She is staying at home, due to being high risk considering covid restrictions and husband has cancer. Not working  Tolerating meds. Was working half time before   Anxiety is less when home, not feeling overwhelmed.  Works at [employer] and has gone thru some harrassment concerns in the past.   Aggravating factor: husband sickness,job stress per history Modifying factor: some friends  Past Psychiatric History: Patient denies any history of psychiatric inpatient treatment, suicidal attempt, self abusive behavior, traumatic brain injury or any history of abuse.  Previous Psychotropic Medications: Yes   Substance Abuse History in the last 12 months:  No.  Consequences of Substance Abuse: Negative  Past Medical History:  Past Medical History:  Diagnosis Date  . Anxiety   . Arthritis    "knees" (03/28/2014)   . Chronic renal insufficiency    stage II (mild)  . Depression   . Headache    with high blood sugar  . Hypercholesteremia   . Hypertension   . Numbness and tingling of right arm and leg   . Seasonal allergies   . Type II diabetes mellitus ([place])   . Umbilical hernia without mention of obstruction or gangrene   . Wears glasses     Past Surgical History:  Procedure Laterality Date  . INSERTION OF MESH N/A 03/28/2014   Procedure: INSERTION OF MESH;  Surgeon: Ralene Ok, MD;  Location: [place];  Service: General;  Laterality: N/A;  . INSERTION OF MESH N/A 05/08/2015   Procedure: INSERTION OF MESH;  Surgeon: Ralene Ok, MD;  Location: [place];  Service: General;  Laterality: N/A;  . LAPAROSCOPIC LYSIS OF ADHESIONS N/A 05/08/2015   Procedure: LAPAROSCOPIC LYSIS OF ADHESIONS;  Surgeon: Ralene Ok, MD;  Location: [place];  Service: General;  Laterality: N/A;  . MULTIPLE TOOTH EXTRACTIONS  ~ 2003  . TONSILLECTOMY    . [place]  . VENTRAL HERNIA REPAIR N/A 03/28/2014   Procedure: LAPAROSCOPIC VENTRAL HERNIA;  Surgeon: Ralene Ok, MD;  Location: [place];  Service: General;  Laterality: N/A;  . VENTRAL HERNIA REPAIR N/A 05/08/2015   Procedure: LAPAROSCOPIC VENTRAL HERNIA REPAIR WITH MESH;  Surgeon: Ralene Ok, MD;  Location: [place];  Service: General;  Laterality: N/A;    Family Psychiatric History: Viewed  Family History:  Family History  Problem Relation Age of Onset  . Diabetes Mother   . High Cholesterol  Mother   . Obesity Mother   . Diabetes Father   . Cancer Father        Prostate cancer  . Cancer Sister        Cervical cancer  . Diabetes Brother   . Bipolar disorder Brother   . Alcohol abuse Brother   . Drug abuse Brother   . Diabetes Sister   . Diabetes Brother     Social History:   Social History   Socioeconomic History  . Marital status: Married    Spouse name: [name]  . Number of children: 1  . Years of education: Not  on file  . Highest education level: Not on file  Occupational History  . Not on file  Social Needs  . Financial resource strain: Not on file  . Food insecurity:    Worry: Not on file    Inability: Not on file  . Transportation needs:    Medical: Not on file    Non-medical: Not on file  Tobacco Use  . Smoking status: Former Smoker    Packs/day: 0.25    Years: 26.00    Pack years: 6.50    Types: Cigarettes  . Smokeless tobacco: Never Used  Substance and Sexual Activity  . Alcohol use: Yes    Comment: "have a drink a few times/year"  . Drug use: No  . Sexual activity: Not Currently  Lifestyle  . Physical activity:    Days per week: Not on file    Minutes per session: Not on file  . Stress: Not on file  Relationships  . Social connections:    Talks on phone: Not on file    Gets together: Not on file    Attends religious service: Not on file    Active member of club or organization: Not on file    Attends meetings of clubs or organizations: Not on file    Relationship status: Not on file  Other Topics Concern  . Not on file  Social History Narrative  . Not on file     Allergies:   Allergies  Allergen Reactions  . Eggs Or Egg-Derived Products Hives and Other (See Comments)    FLU VACCINE: caused hives    . Influenza Vaccines Hives    Vaccine from Egg derived product causes Hives  . Effexor [Venlafaxine]     "felt weird"  . Metformin And Related Swelling    ANGIOEDEMA    Metabolic Disorder Labs: Lab Results  Component Value Date   HGBA1C 10.8 (H) 06/14/2018   MPG 384 (H) 01/09/2015   MPG 160 (H) 03/28/2014   No results found for: PROLACTIN Lab Results  Component Value Date   CHOL 174 06/14/2018   TRIG 123 06/14/2018   HDL 46 06/14/2018   CHOLHDL 4 09/09/2017   VLDL 41.4 (H) 09/09/2017   LDLCALC 103 (H) 06/14/2018   LDLCALC 98 10/07/2017     Current Medications: Current Outpatient Medications  Medication Sig Dispense Refill  . blood glucose  meter kit and supplies Dispense based on patient and insurance preference. Use up to three times daily as directed. (FOR ICD-10 E10.9, E11.9). 1 each 0  . buPROPion (WELLBUTRIN XL) 300 MG 24 hr tablet Take 1 tablet (300 mg total) by mouth daily. 90 tablet 0  . dapagliflozin propanediol (FARXIGA) 10 MG TABS tablet Take 10 mg by mouth daily. Must schedule appt for refills 30 tablet 0  . gabapentin (NEURONTIN) 300 MG capsule Take 1 capsule (300  mg total) by mouth 2 (two) times daily. 60 capsule 1  . insulin degludec (TRESIBA FLEXTOUCH) 100 UNIT/ML SOPN FlexTouch Pen Inject 0.55 mLs (55 Units total) into the skin daily. 15 pen 0  . olmesartan-hydrochlorothiazide (BENICAR HCT) 20-12.5 MG tablet Take 1 tablet by mouth daily. Due for follow up visit w/PCP 30 tablet 0  . sertraline (ZOLOFT) 100 MG tablet Take one and one/half tablet daily. 135 tablet 0  . simvastatin (ZOCOR) 10 MG tablet Take 1 tablet (10 mg total) by mouth daily. 90 tablet 4  . Topiramate ER (TROKENDI XR) 50 MG CP24 Take 1 tablet by mouth daily. 90 capsule 1  . TRULICITY 1.5 BV/6.7OL SOPN INJECT 1.5 MG INTO THE SKIN ONCE A WEEK. 6 pen 1  . Vitamin D, Ergocalciferol, (DRISDOL) 1.25 MG (50000 UT) CAPS capsule Take 1 capsule (50,000 Units total) by mouth every 7 (seven) days. 4 capsule 0   No current facility-administered medications for this visit.       Psychiatric Specialty Exam: Review of Systems  Cardiovascular: Negative for chest pain.  Skin: Negative for rash.  Neurological: Negative for tremors.    There were no vitals taken for this visit.There is no height or weight on file to calculate BMI.  General Appearance:  Eye Contact:   Speech:  Slow  Volume:  Decreased  Mood: some better  Affect:some better  Thought Process:  Goal Directed  Orientation:  Full (Time, Place, and Person)  Thought Content:  Rumination  Suicidal Thoughts:  No  Homicidal Thoughts:  No  Memory:  Immediate;   Fair Recent;   Fair Remote;   Fair   Judgement:  Good  Insight:  Good  Psychomotor Activity:  Concentration:  Concentration: Fair and Attention Span: Fair  Recall:  Good  Fund of Knowledge:Good  Language: Good  Akathisia:  No  Handed:  Right  AIMS (if indicated):  0  Assets:  Communication Skills Desire for Improvement Housing  ADL's:  Intact  Cognition: WNL  Sleep:  fair    Treatment Plan Summary: Major depressive disorder, recurrent severe:less overwhelmed, staying at home. Continue meds and therapy  Generalized anxiety :not worse, continue zoloft Panic attacks :sporadic continue zoloft  Worried about work stress , as of now at home due to virus restrictions  I discussed the assessment and treatment plan with the patient. The patient was provided an opportunity to ask questions and all were answered. The patient agreed with the plan and demonstrated an understanding of the instructions.   The patient was advised to call back or seek an in-person evaluation if the symptoms worsen or if the condition fails to improve as anticipated.  I provided 15 minutes of non-face-to-face time during this encounter. Fu 4-6 w or earlier if needed  Merian Capron, MD 3/26/202010:51 AM

## 2018-11-14 ENCOUNTER — Other Ambulatory Visit: Payer: Self-pay | Admitting: Physician Assistant

## 2018-11-14 ENCOUNTER — Ambulatory Visit (INDEPENDENT_AMBULATORY_CARE_PROVIDER_SITE_OTHER): Payer: 59 | Admitting: Internal Medicine

## 2018-11-14 ENCOUNTER — Encounter: Payer: Self-pay | Admitting: Internal Medicine

## 2018-11-14 DIAGNOSIS — F330 Major depressive disorder, recurrent, mild: Secondary | ICD-10-CM

## 2018-11-14 DIAGNOSIS — F411 Generalized anxiety disorder: Secondary | ICD-10-CM

## 2018-11-14 DIAGNOSIS — Z794 Long term (current) use of insulin: Secondary | ICD-10-CM

## 2018-11-14 DIAGNOSIS — E1165 Type 2 diabetes mellitus with hyperglycemia: Secondary | ICD-10-CM | POA: Diagnosis not present

## 2018-11-14 DIAGNOSIS — E1169 Type 2 diabetes mellitus with other specified complication: Secondary | ICD-10-CM | POA: Diagnosis not present

## 2018-11-14 DIAGNOSIS — E1122 Type 2 diabetes mellitus with diabetic chronic kidney disease: Secondary | ICD-10-CM | POA: Diagnosis not present

## 2018-11-14 DIAGNOSIS — E785 Hyperlipidemia, unspecified: Secondary | ICD-10-CM

## 2018-11-14 MED ORDER — INSULIN DEGLUDEC 200 UNIT/ML ~~LOC~~ SOPN: 56.0000 [IU] | PEN_INJECTOR | Freq: Every day | SUBCUTANEOUS | 3 refills | Status: DC

## 2018-11-14 NOTE — Progress Notes (Signed)
Confirmed medications, pharmacy, and allergies.  Confirmed patient has webex ready to go.

## 2018-11-14 NOTE — Patient Instructions (Addendum)
Please continue: - Farxiga 10 mg daily in am - Trulicity 1.5 mg weekly  - Tresiba 55 units daily  Try to move dinner earlier.  No snacks between meal or after dinner.  Please return in 3 months with your sugar log.

## 2018-11-14 NOTE — Progress Notes (Signed)
Patient ID: Jasmine [name], female   DOB: September 22, 1965, 53 y.o.   MRN: [id number]   Patient location: car My location: Office  Referring Provider: Donella Stade, PA-C  I connected with the patient on 11/14/18 at 11:00 AM EDT by a video enabled telemedicine application and verified that I am speaking with the correct person.   I discussed the limitations of evaluation and management by telemedicine and the availability of in person appointments. The patient expressed understanding and agreed to proceed.   Details of the encounter are shown below.  HPI: Jasmine [name] is a 53 y.o.-year-old female, presenting for f/u for DM2, dx in 2014, but had GDM in 1992, insulin-dependent, uncontrolled, with complications (CKD, DR).  She has been seen previously by Dr. Loanne Drilling, but first visit with me 1 year and 3 mo ago (!).  She is not always compliant with her meds 2/2 depression. Goes to [employer].On Wellbutrin.  Husband was dx'ed with cancer.  She started to [facility] the [facility].   She was recently found to have a low B12 vitamin >> started supplementation.  Last hemoglobin A1c was: Lab Results  Component Value Date   HGBA1C 10.8 (H) 06/14/2018   HGBA1C 7.6 (A) 03/08/2018   HGBA1C 7.7 (H) 10/07/2017   Pt was on a regimen of: - Tresiba 18 units at night (increased from 10 to 18 units 3 weeks ago) - Trulicity 1.5 mg weekly - Sat. She stopped Farxiga 10 mg daily in am in 04/2017 >> repeated yeast inf's She was on Novolog 2 units 3x a day, before meals - started 03/2017, stopped 05/2017.  Now on: - Farxiga 10 mg daily in am - Trulicity 1.5 mg weekly  - Tresiba 20 >> 55 units daily  Pt checks her sugars 2-3x a day: - am: 170s >> 130s >> 132, 168-193 - 2h after b'fast: n/c >> 130-160s >> n/c - before lunch: n/c - 2h after lunch: n/c >> 150-160 - before dinner: 170s >> n/c - 2h after dinner: n/c - bedtime: 170's-180 >> 130-209 >> 150-160s - nighttime: n/c Lowest  sugar was 80 (did not eat), OTW 132 >> 130 >> 132; she has hypoglycemia awareness in the 80s. Highest sugar was 186 >> 275 >> 198.  Glucometer: FreeStyle  Pt's meals are: - Breakfast: coffee or PB sandwich + coffee - Lunch: chicken, veggies - Dinner: bowl of cereal or skips >> now 10 oz of meat and 2 cups of veggies >> now at 8-9 pm - Snacks: many: M and M, granola bar, trailmix Stopped sweet tea.  - + CKD, last BUN/creatinine:  Lab Results  Component Value Date   BUN 19 06/14/2018   BUN 12 10/07/2017   CREATININE 1.56 (H) 06/14/2018   CREATININE 1.28 (H) 10/07/2017  On Losartan. -  + HL; last set of lipids: Lab Results  Component Value Date   CHOL 174 06/14/2018   HDL 46 06/14/2018   LDLCALC 103 (H) 06/14/2018   LDLDIRECT 103.0 09/09/2017   TRIG 123 06/14/2018   CHOLHDL 4 09/09/2017  On Zocor. - last eye exam was in 02/2016: + DR - no numbness and tingling in her feet.   Pt has FH of DM in "everybody" - both sides of family: M, F, S's, B's, aunts.  ROS: Constitutional: no weight gain/no weight loss, no fatigue, no subjective hyperthermia, no subjective hypothermia Eyes: no blurry vision, no xerophthalmia ENT: no sore throat, no nodules palpated in neck, no dysphagia,  no odynophagia, no hoarseness Cardiovascular: no CP/no SOB/no palpitations/no leg swelling Respiratory: no cough/no SOB/no wheezing Gastrointestinal: no N/no V/no D/no C/no acid reflux Musculoskeletal: no muscle aches/no joint aches Skin: no rashes, no hair loss Neurological: no tremors/no numbness/no tingling/no dizziness  I reviewed pt's medications, allergies, PMH, social hx, family hx, and changes were documented in the history of present illness. Otherwise, unchanged from my initial visit note.  Past Medical History:  Diagnosis Date  . Anxiety   . Arthritis    "knees" (03/28/2014)  . Chronic renal insufficiency    stage II (mild)  . Depression   . Headache    with high blood sugar  .  Hypercholesteremia   . Hypertension   . Numbness and tingling of right arm and leg   . Seasonal allergies   . Type II diabetes mellitus ([place])   . Umbilical hernia without mention of obstruction or gangrene   . Wears glasses    Past Surgical History:  Procedure Laterality Date  . INSERTION OF MESH N/A 03/28/2014   Procedure: INSERTION OF MESH;  Surgeon: Ralene Ok, MD;  Location: [place];  Service: General;  Laterality: N/A;  . INSERTION OF MESH N/A 05/08/2015   Procedure: INSERTION OF MESH;  Surgeon: Ralene Ok, MD;  Location: [place];  Service: General;  Laterality: N/A;  . LAPAROSCOPIC LYSIS OF ADHESIONS N/A 05/08/2015   Procedure: LAPAROSCOPIC LYSIS OF ADHESIONS;  Surgeon: Ralene Ok, MD;  Location: [place];  Service: General;  Laterality: N/A;  . MULTIPLE TOOTH EXTRACTIONS  ~ 2003  . TONSILLECTOMY    . [place]  . VENTRAL HERNIA REPAIR N/A 03/28/2014   Procedure: LAPAROSCOPIC VENTRAL HERNIA;  Surgeon: Ralene Ok, MD;  Location: [place];  Service: General;  Laterality: N/A;  . VENTRAL HERNIA REPAIR N/A 05/08/2015   Procedure: LAPAROSCOPIC VENTRAL HERNIA REPAIR WITH MESH;  Surgeon: Ralene Ok, MD;  Location: [place];  Service: General;  Laterality: N/A;   Social History   Socioeconomic History  . Marital status: Married    Spouse name: [name]  . Number of children: 1  . Years of education: Not on file  . Highest education level: Not on file  Occupational History  . Not on file  Social Needs  . Financial resource strain: Not on file  . Food insecurity:    Worry: Not on file    Inability: Not on file  . Transportation needs:    Medical: Not on file    Non-medical: Not on file  Tobacco Use  . Smoking status: Former Smoker    Packs/day: 0.25    Years: 26.00    Pack years: 6.50    Types: Cigarettes  . Smokeless tobacco: Never Used  Substance and Sexual Activity  . Alcohol use: Yes    Comment: "have a drink a few times/year"  .  Drug use: No  . Sexual activity: Not Currently  Lifestyle  . Physical activity:    Days per week: Not on file    Minutes per session: Not on file  . Stress: Not on file  Relationships  . Social connections:    Talks on phone: Not on file    Gets together: Not on file    Attends religious service: Not on file    Active member of club or organization: Not on file    Attends meetings of clubs or organizations: Not on file    Relationship status: Not on file  . Intimate partner  violence:    Fear of current or ex partner: Not on file    Emotionally abused: Not on file    Physically abused: Not on file    Forced sexual activity: Not on file  Other Topics Concern  . Not on file  Social History Narrative  . Not on file   Current Outpatient Medications on File Prior to Visit  Medication Sig Dispense Refill  . blood glucose meter kit and supplies Dispense based on patient and insurance preference. Use up to three times daily as directed. (FOR ICD-10 E10.9, E11.9). 1 each 0  . buPROPion (WELLBUTRIN XL) 300 MG 24 hr tablet Take 1 tablet (300 mg total) by mouth daily. 90 tablet 0  . dapagliflozin propanediol (FARXIGA) 10 MG TABS tablet Take 10 mg by mouth daily. Must schedule appt for refills 30 tablet 0  . gabapentin (NEURONTIN) 300 MG capsule Take 1 capsule (300 mg total) by mouth 2 (two) times daily. 60 capsule 1  . insulin degludec (TRESIBA FLEXTOUCH) 100 UNIT/ML SOPN FlexTouch Pen Inject 0.55 mLs (55 Units total) into the skin daily. 15 pen 0  . olmesartan-hydrochlorothiazide (BENICAR HCT) 20-12.5 MG tablet Take 1 tablet by mouth daily. Due for follow up visit w/PCP 30 tablet 0  . simvastatin (ZOCOR) 10 MG tablet Take 1 tablet (10 mg total) by mouth daily. 90 tablet 4  . Topiramate ER (TROKENDI XR) 50 MG CP24 Take 1 tablet by mouth daily. 90 capsule 1  . TRULICITY 1.5 CZ/6.6AY SOPN INJECT 1.5 MG INTO THE SKIN ONCE A WEEK. 6 pen 1  . Vitamin D, Ergocalciferol, (DRISDOL) 1.25 MG (50000 UT)  CAPS capsule Take 1 capsule (50,000 Units total) by mouth every 7 (seven) days. 4 capsule 0  . [DISCONTINUED] DULoxetine (CYMBALTA) 20 MG capsule Take 1 capsule (20 mg total) by mouth daily. 90 capsule 1   No current facility-administered medications on file prior to visit.    Allergies  Allergen Reactions  . Eggs Or Egg-Derived Products Hives and Other (See Comments)    FLU VACCINE: caused hives    . Influenza Vaccines Hives    Vaccine from Egg derived product causes Hives  . Effexor [Venlafaxine]     "felt weird"  . Metformin And Related Swelling    ANGIOEDEMA   Family History  Problem Relation Age of Onset  . Diabetes Mother   . High Cholesterol Mother   . Obesity Mother   . Diabetes Father   . Cancer Father        Prostate cancer  . Cancer Sister        Cervical cancer  . Diabetes Brother   . Bipolar disorder Brother   . Alcohol abuse Brother   . Drug abuse Brother   . Diabetes Sister   . Diabetes Brother     PE: There were no vitals taken for this visit. Wt Readings from Last 3 Encounters:  08/16/18 259 lb (117.5 kg)  07/26/18 260 lb (117.9 kg)  07/12/18 258 lb (117 kg)   Constitutional:  in NAD  The physical exam was not performed (virtual visit).  ASSESSMENT: 1. DM2, insulin-dependent, uncontrolled, with complications - CKD stage 2 - DR  2. HL  3. Obesity  PLAN:  1. Patient with long standing, uncontrolled, DM2, on oral diabetes regimen, with SGLT2 inh. But also on weekly GLP1 R agonist and long acting insulin.  - unfortunately, her depression limits her compliance with mediation and visits. At last OV, we did not change her  regimen as she was not taking the doses consistently and improve diet - at last check, HbA1c was much higher, at 10.8%, increased from 7.6% - she had previous yeast inf's with [facility]ade, not lately -At this visit, sugars are higher than target in the morning but they are at goal later in the day.  We discussed about different  strategies to improve her morning sugars.  We cannot use metformin since she had angioedema from it.  I would not want to increase her insulin since she is trying hard to lose weight.  Upon questioning, she is eating dinner late, at 8-9 PM and I strongly advised her to move these earlier.  Also, we discussed about not snacking between meals and especially after dinner.  We also discussed about the composition of dinner and to limit meat especially at night while replacing this with vegetables (beans, mushrooms, etc.).  I think this will improve her sugar significantly, but I advised her to let me know in 1 to 2 weeks if this does not work. -We may need to switch to Ozempic 1 mg daily if the above measures do not improve her sugar significantly, which is a little stronger. -I also advised her to switch to [place] U200 which is better absorbed from the site - I suggested to:  Patient Instructions  Please continue: - Farxiga 10 mg daily in am - Trulicity 1.5 mg weekly  - Tresiba 55 units daily  Try to move dinner earlier.  No snacks between meal or after dinner.  Please return in 3 months with your sugar log.   - will check HbA1c when she returns to the clinic - continue checking sugars at different times of the day - check 1x a day, rotating checks - advised for yearly eye exams >> she is not UTD - Return to clinic in 3 mo with sugar log    2. HL - Reviewed latest lipid panel from 06/2018: LDL above target, TG and HDL normal Lab Results  Component Value Date   CHOL 174 06/14/2018   HDL 46 06/14/2018   LDLCALC 103 (H) 06/14/2018   LDLDIRECT 103.0 09/09/2017   TRIG 123 06/14/2018   CHOLHDL 4 09/09/2017  - Continues Zocor without side effects.  3. Obesity - continue SGLT2 inh and GLP1 R agonist which should both also help with wt loss - advised to move dinner earlier and stop snacking b/w meals and after dinner  Philemon Kingdom, MD PhD [facility] Endocrin[facility]

## 2018-11-16 ENCOUNTER — Telehealth (INDEPENDENT_AMBULATORY_CARE_PROVIDER_SITE_OTHER): Payer: 59 | Admitting: Physician Assistant

## 2018-11-16 ENCOUNTER — Other Ambulatory Visit: Payer: Self-pay

## 2018-11-16 ENCOUNTER — Encounter: Payer: Self-pay | Admitting: Physician Assistant

## 2018-11-16 VITALS — BP 122/75 | Temp 96.8°F | Wt 250.0 lb

## 2018-11-16 DIAGNOSIS — F330 Major depressive disorder, recurrent, mild: Secondary | ICD-10-CM | POA: Diagnosis not present

## 2018-11-16 DIAGNOSIS — E785 Hyperlipidemia, unspecified: Secondary | ICD-10-CM

## 2018-11-16 DIAGNOSIS — R519 Headache, unspecified: Secondary | ICD-10-CM

## 2018-11-16 DIAGNOSIS — E559 Vitamin D deficiency, unspecified: Secondary | ICD-10-CM

## 2018-11-16 DIAGNOSIS — F411 Generalized anxiety disorder: Secondary | ICD-10-CM | POA: Diagnosis not present

## 2018-11-16 DIAGNOSIS — E66813 Obesity, class 3: Secondary | ICD-10-CM

## 2018-11-16 DIAGNOSIS — I10 Essential (primary) hypertension: Secondary | ICD-10-CM | POA: Diagnosis not present

## 2018-11-16 DIAGNOSIS — R51 Headache: Secondary | ICD-10-CM

## 2018-11-16 DIAGNOSIS — Z6841 Body Mass Index (BMI) 40.0 and over, adult: Secondary | ICD-10-CM

## 2018-11-16 DIAGNOSIS — E1169 Type 2 diabetes mellitus with other specified complication: Secondary | ICD-10-CM

## 2018-11-16 MED ORDER — BUPROPION HCL ER (XL) 300 MG PO TB24: 300.0000 mg | ORAL_TABLET | Freq: Every day | ORAL | 1 refills | Status: DC

## 2018-11-16 MED ORDER — VITAMIN D (ERGOCALCIFEROL) 1.25 MG (50000 UNIT) PO CAPS: 50000.0000 [IU] | ORAL_CAPSULE | ORAL | 0 refills | Status: DC

## 2018-11-16 MED ORDER — OLMESARTAN MEDOXOMIL-HCTZ 20-12.5 MG PO TABS: 1.0000 | ORAL_TABLET | Freq: Every day | ORAL | 1 refills | Status: DC

## 2018-11-16 MED ORDER — SERTRALINE HCL 100 MG PO TABS: ORAL_TABLET | ORAL | 1 refills | Status: DC

## 2018-11-16 MED ORDER — TOPIRAMATE ER 50 MG PO CAP24: 1.0000 | ORAL_CAPSULE | Freq: Every day | ORAL | 1 refills | Status: DC

## 2018-11-16 NOTE — Progress Notes (Signed)
Patient ID: Jasmine [name], female   DOB: 22-Aug-1965, 53 y.o.   MRN: [id number]  .Marland Kitchen.Virtual Visit via Video Note  I connected with Jasmine [name] on 11/21/18 at 10:10 AM EDT by a video enabled telemedicine application and verified that I am speaking with the correct person using two identifiers.   I discussed the limitations of evaluation and management by telemedicine and the availability of in person appointments. The patient expressed understanding and agreed to proceed.  History of Present Illness: Pt is a 53 yo female with T2DM, HTN, CKD, MDD, anxiety, headaches who needs medication refill.   She is managed by endocrinology for DM and weight.   She denies any CP, palpitations. She is taking her BP medication daily.   Overall her depression and anxiety are good. She has good and bad days. She has to be support of her husband who is fighting cancer. she also needs a letter for work stating she needs to work from home due to high risk for COVID and living with someone fighting cancer.  Overall headaches are better on trokendi. Reports only having a "a few a month".   .. Active Ambulatory Problems    Diagnosis Date Noted  . Depression 02/09/2010  . ALLERGIC RHINITIS 02/09/2010  . Morbid obesity with BMI of 50.0-59.9, adult (HCC) 06/03/2012  . Hyperlipidemia 11/27/2013  . Primary osteoarthritis of both knees 11/27/2013  . Chronic renal insufficiency, stage II (mild) 11/28/2013  . Umbilical hernia without obstruction and without gangrene 02/02/2014  . Ventral hernia, recurrent 03/28/2014  . Essential hypertension 01/09/2015  . Diabetic retinopathy (HCC) 03/12/2016  . Hyperlipidemia associated with type 2 diabetes mellitus (HCC) 06/17/2016  . Anxiety state 06/17/2016  . Tobacco use 08/13/2016  . Micturition syncope 08/13/2016  . Vitamin D deficiency 11/27/2016  . B12 deficiency 11/27/2016  . Frequent headaches 01/05/2017  . Menopause 05/13/2017  . Severe episode of recurrent  major depressive disorder, without psychotic features (HCC) 05/19/2017  . Generalized anxiety disorder 05/19/2017  . Severe major depression, single episode, without psychotic features (HCC) 05/26/2017  . Type 2 diabetes mellitus with chronic kidney disease, with long-term current use of insulin (HCC) 07/12/2017  . Acute stress reaction 03/09/2018   Resolved Ambulatory Problems    Diagnosis Date Noted  . No Resolved Ambulatory Problems   Past Medical History:  Diagnosis Date  . Anxiety   . Arthritis   . Chronic renal insufficiency   . Headache   . Hypercholesteremia   . Hypertension   . Numbness and tingling of right arm and leg   . Seasonal allergies   . Type II diabetes mellitus (HCC)   . Umbilical hernia without mention of obstruction or gangrene   . Wears glasses    Reveiwed med, allegry, problem list.  ,     Observations/Objective: No acute distress.  Obese.   . [facility] 11/16/2018 03/08/2018 10/07/2017 05/20/2017  Decreased Interest 1 3 2 3   Down, Depressed, Hopeless 0 3 2 3   PHQ - 2 Score 1 6 4 6   Altered sleeping 0 3 0 3  Tired, decreased energy 3 3 1 3   Change in appetite 1 2 3 3   Feeling bad or failure about yourself  1 2 2 2   Trouble concentrating 3 3 3 3   Moving slowly or fidgety/restless 2 2 1 3   Suicidal thoughts 0 0 0 0  PHQ-9 Score 11 21 14 23   Difficult doing work/chores - Somewhat difficult Somewhat difficult Extremely dIfficult  Some encounter information is confidential and restricted. Go to Review Flowsheets activity to see all data.  Some recent data might be hidden   . GAD 7 : Generalized Anxiety Score 11/16/2018 03/08/2018 05/20/2017  Nervous, Anxious, on Edge 3 3 3   Control/stop worrying 3 3 3   Worry too much - different things 3 3 3   Trouble relaxing 2 3 3   Restless 1 3 3   Easily annoyed or irritable 3 3 3   Afraid - awful might happen 2 3 3   Total GAD 7 Score 17 21 21   Anxiety Difficulty Somewhat difficult Somewhat  difficult Extremely difficult  Some encounter information is confidential and restricted. Go to Review Flowsheets activity to see all data.     Assessment and Plan: Marland KitchenMarland KitchenDiagnoses and all orders for this visit:  Mild episode of recurrent major depressive disorder (HCC) -     sertraline (ZOLOFT) 100 MG tablet; Take one and one/half tablet daily. -     buPROPion (WELLBUTRIN XL) 300 MG 24 hr tablet; Take 1 tablet (300 mg total) by mouth daily.  Anxiety state -     sertraline (ZOLOFT) 100 MG tablet; Take one and one/half tablet daily.  Vitamin D deficiency -     Vitamin D, Ergocalciferol, (DRISDOL) 1.25 MG (50000 UT) CAPS capsule; Take 1 capsule (50,000 Units total) by mouth every 7 (seven) days.  Essential hypertension -     olmesartan-hydrochlorothiazide (BENICAR HCT) 20-12.5 MG tablet; Take 1 tablet by mouth daily.  Hyperlipidemia associated with type 2 diabetes mellitus (HCC)  Frequent headaches -     Topiramate ER (TROKENDI XR) 50 MG CP24; Take 1 tablet by mouth daily.  medication refills.   Labs checked with endocrinology.   Letter written for work to be allowed to work at home due to her husband fighting cancer and her co-morbidities.    Follow Up Instructions:    I discussed the assessment and treatment plan with the patient. The patient was provided an opportunity to ask questions and all were answered. The patient agreed with the plan and demonstrated an understanding of the instructions.   The patient was advised to call back or seek an in-person evaluation if the symptoms worsen or if the condition fails to improve as anticipated.  I provided 23 minutes of non-face-to-face time during this encounter.   Tandy Gaw, PA-C

## 2018-11-16 NOTE — Progress Notes (Signed)
-  Topamax: helping, only about 2 HA a week  -Benicar: no issues  *PLEASE SEE PHQ/GAD*

## 2018-11-21 DIAGNOSIS — Z6841 Body Mass Index (BMI) 40.0 and over, adult: Secondary | ICD-10-CM

## 2018-11-22 ENCOUNTER — Encounter: Payer: Self-pay | Admitting: Physician Assistant

## 2018-11-22 NOTE — Telephone Encounter (Signed)
When I get forms we could schedule another virtual visit to make sure all the dates are right. Can she not work from home? That was the last note I wrote to allow her to work from home exclusively during pandemic. Her husband is high risk as well due to cancer dx.

## 2018-11-22 NOTE — Telephone Encounter (Signed)
Thanks

## 2018-11-22 NOTE — Telephone Encounter (Signed)
Pt requested I return call. She is going to take vacation from today - 12/06/18. If at that time she needs to be out longer she will get the paperwork sent to [facility] and schedule a visit to complete.   Nothing further needed at this time.

## 2018-11-22 NOTE — Telephone Encounter (Signed)
Called and spoke with Pt. Her employer is concerned about her working with her underlying health issues during this time. They are especially concerned about her shortness of breath and diabetes. She is inquiring if PCP would write her out of work for short term disability during this time. She reports she doesn't qualify for FMLA at her job.  Pt was seen last week. Unsure if that visit is enough, or if another one should be scheduled. Her job is sending her home now, so she will be available today if needed.

## 2018-12-06 ENCOUNTER — Encounter: Payer: Self-pay | Admitting: Physician Assistant

## 2018-12-08 ENCOUNTER — Ambulatory Visit (INDEPENDENT_AMBULATORY_CARE_PROVIDER_SITE_OTHER): Payer: 59 | Admitting: Osteopathic Medicine

## 2018-12-08 ENCOUNTER — Ambulatory Visit (INDEPENDENT_AMBULATORY_CARE_PROVIDER_SITE_OTHER): Payer: 59

## 2018-12-08 ENCOUNTER — Ambulatory Visit: Payer: 59 | Admitting: Family Medicine

## 2018-12-08 ENCOUNTER — Encounter: Payer: Self-pay | Admitting: Physician Assistant

## 2018-12-08 ENCOUNTER — Encounter: Payer: Self-pay | Admitting: Osteopathic Medicine

## 2018-12-08 ENCOUNTER — Other Ambulatory Visit: Payer: Self-pay

## 2018-12-08 ENCOUNTER — Ambulatory Visit (INDEPENDENT_AMBULATORY_CARE_PROVIDER_SITE_OTHER): Payer: 59 | Admitting: Physician Assistant

## 2018-12-08 VITALS — Temp 96.5°F | Ht 62.0 in | Wt 249.0 lb

## 2018-12-08 VITALS — BP 134/79 | HR 83 | Temp 98.4°F | Wt 263.8 lb

## 2018-12-08 DIAGNOSIS — R0602 Shortness of breath: Secondary | ICD-10-CM

## 2018-12-08 DIAGNOSIS — R51 Headache: Secondary | ICD-10-CM | POA: Diagnosis not present

## 2018-12-08 DIAGNOSIS — R531 Weakness: Secondary | ICD-10-CM

## 2018-12-08 DIAGNOSIS — R0789 Other chest pain: Secondary | ICD-10-CM | POA: Diagnosis not present

## 2018-12-08 DIAGNOSIS — R079 Chest pain, unspecified: Secondary | ICD-10-CM | POA: Diagnosis not present

## 2018-12-08 DIAGNOSIS — R519 Headache, unspecified: Secondary | ICD-10-CM

## 2018-12-08 NOTE — Progress Notes (Signed)
-  last time she spoke with Lesly Rubenstein, instead of taking 2 weeks vacation she tried taking just 1/2 days, but kept getting tired at work  -having SOB every time she walks/moves/bends down  -feels constant pressure on chest  -mucinex, sinus meds, and teas help  -had some cough, not consistent

## 2018-12-08 NOTE — Progress Notes (Signed)
Patient ID: Jasmine [name], female   DOB: [birth year], 53 y.o.   MRN: [id number] .Marland KitchenVirtual Visit via Video Note  I connected with Jasmine [name] on 12/08/18 at  9:50 AM EDT by a video enabled telemedicine application and verified that I am speaking with the correct person using two identifiers.  Location: Patient: home Provider: home   I discussed the limitations of evaluation and management by telemedicine and the availability of in person appointments. The patient expressed understanding and agreed to proceed.  History of Present Illness: Pt is a 53 yo obese female with T2DM, HTN, CKD, HLD who calls in with SOB for one week. She was working 1/2 days at work but feeling so weak and tired she has not worked since last Thursday. SOB is every time she walks, moves, bends down. She feels a constant pressure on chest. No hx of asthma. No wheezing. She does have a cough but not all the time and not productive. She does get some relief when she leans forward. She has taken mucinex and OTC sinus medications with no relief. She now has a headache. She denies any fever, chills, or body aches. No known sick exposures. Denies any lower extremity swelling.   .. Active Ambulatory Problems    Diagnosis Date Noted  . Depression 02/09/2010  . ALLERGIC RHINITIS 02/09/2010  . Morbid obesity with BMI of 50.0-59.9, adult (HCC) 06/03/2012  . Hyperlipidemia 11/27/2013  . Primary osteoarthritis of both knees 11/27/2013  . Chronic renal insufficiency, stage II (mild) 11/28/2013  . Umbilical hernia without obstruction and without gangrene 02/02/2014  . Ventral hernia, recurrent 03/28/2014  . Essential hypertension 01/09/2015  . Diabetic retinopathy (HCC) 03/12/2016  . Hyperlipidemia associated with type 2 diabetes mellitus (HCC) 06/17/2016  . Anxiety state 06/17/2016  . Tobacco use 08/13/2016  . Micturition syncope 08/13/2016  . Vitamin D deficiency 11/27/2016  . B12 deficiency 11/27/2016  . Frequent  headaches 01/05/2017  . Menopause 05/13/2017  . Severe episode of recurrent major depressive disorder, without psychotic features (HCC) 05/19/2017  . Generalized anxiety disorder 05/19/2017  . Severe major depression, single episode, without psychotic features (HCC) 05/26/2017  . Type 2 diabetes mellitus with chronic kidney disease, with long-term current use of insulin (HCC) 07/12/2017  . Acute stress reaction 03/09/2018  . Class 3 severe obesity due to excess calories with serious comorbidity and body mass index (BMI) of 45.0 to 49.9 in adult [facility]) 11/21/2018   Resolved Ambulatory Problems    Diagnosis Date Noted  . No Resolved Ambulatory Problems   Past Medical History:  Diagnosis Date  . Anxiety   . Arthritis   . Chronic renal insufficiency   . Headache   . Hypercholesteremia   . Hypertension   . Numbness and tingling of right arm and leg   . Seasonal allergies   . Type II diabetes mellitus (HCC)   . Umbilical hernia without mention of obstruction or gangrene   . Wears glasses    Reviewed med, allergy, problem list.     Observations/Objective: No acute distress. No labored breathing. No cough. Normal mood.   .. Today's Vitals   12/08/18 0935  Temp: (!) 96.5 F (35.8 C)  TempSrc: Oral  Weight: 249 lb (112.9 kg)  Height: 5\' 2"  (1.575 m)   Body mass index is 45.54 kg/m.    Assessment and Plan: .Amii was seen today for shortness of breath.  Diagnoses and all orders for this visit:  SOB (shortness of breath)  Weakness  Acute nonintractable headache, unspecified headache type  Chest tightness   Unclear etiology of symptoms. We also do not have very good vitals. I cannot exclude pericarditis, acute CHF(lost one pound over the month so unlikely). She reports mild cough. I think auscultation of the lungs would be prudent. She reports improving with leaning forward which is classic of pericarditis. I feel like needs EKG and chest xray and in office visit  with onsite provider today. Pt agrees. Scheduled with Dr. Denyse Amassorey at 11:10.   Follow Up Instructions:    I discussed the assessment and treatment plan with the patient. The patient was provided an opportunity to ask questions and all were answered. The patient agreed with the plan and demonstrated an understanding of the instructions.   The patient was advised to call back or seek an in-person evaluation if the symptoms worsen or if the condition fails to improve as anticipated.  I provided 15  minutes of non-face-to-face time during this encounter.   Tandy GawJade Tomica Arseneault, PA-C

## 2018-12-08 NOTE — Progress Notes (Signed)
HPI: Jasmine [name] is a 53 y.o. female who  has a past medical history of Anxiety, Arthritis, Chronic renal insufficiency, Depression, Headache, Hypercholesteremia, Hypertension, Numbness and tingling of right arm and leg, Seasonal allergies, Type II diabetes mellitus ([place]), Umbilical hernia without mention of obstruction or gangrene, and Wears glasses.  she presents to [facility] today, 12/08/18,  for chief complaint of:  SOB  "Pt is a 53 yo obese female with T2DM, HTN, CKD, HLD who calls in with SOB for one week. She was working 1/2 days at work but feeling so weak and tired she has not worked since last Thursday. SOB is every time she walks, moves, bends down. She feels a constant pressure on chest. No hx of asthma. No wheezing. She does have a cough but not all the time and not productive. She does get some relief when she leans forward. She has taken mucinex and OTC sinus medications with no relief. She now has a headache. She denies any fever, chills, or body aches. No known sick exposures. Denies any lower extremity swelling."  The above history was confirmed with the patient. No orthopnea. Reports feeling of generalized chest tightness and side pain wrapping around her ribs when leaning forward. She feels tenderness to rib cage.     At today's visit 12/08/18 ... PMH, PSH, FH reviewed and updated as needed.  Current medication list and allergy/intolerance hx reviewed and updated as needed. (See remainder of HPI, ROS, Phys Exam below)    EKG interpretation: Rate: 76 Rhythm: sinus  No ST/T changes concerning for acute ischemia/infarct  Previous EKG 09/2017 no change - mild ST nonspecific change most apparent in V2 is apparent    Dg Chest 2 View  Result Date: 12/08/2018 CLINICAL DATA:  Shortness of breath and cough EXAM: CHEST - 2 VIEW COMPARISON:  August 13, 2016 FINDINGS: Lungs are clear. Heart size and pulmonary vascularity are normal. No  adenopathy. There is mild degenerative change in the thoracic spine. IMPRESSION: No edema or consolidation. Electronically Signed   By: Lowella Grip III M.D.   On: 12/08/2018 12:09    No results found for this or any previous visit (from the past 72 hour(s)).        ASSESSMENT/PLAN: The primary encounter diagnosis was SOB (shortness of breath). A diagnosis of Chest pain in adult was also pertinent to this visit.  Pericarditis is in the differential, but costochondritis seems more likely given the tenderness, I don't hear any rub, no EKG abnormalities, CXR looks ok on personal review and radiology over-read. I think we would be ok to monitor closely, r/o PE, pt advised if worse go to ER    Orders Placed This Encounter  Procedures  . DG Chest 2 View  . CBC with Differential/Platelet  . BASIC METABOLIC PANEL WITH GFR  . D-dimer, quantitative (not at [facility])  . EKG 12-Lead        Patient Instructions  I think most likely explanation is a viral respiratory infection. Please follow isolation guidelines from CDC: self-isolate for at least 7 days PLUS 3 days after the symptoms are gone.   Possibility of a blood clot in the lung is about 1.3%. If D-Dimer test is positive, we will get you set up for a CT of the chest. If D-Dimer test is negative, we can be confident there is NO clot.   Let's get labs today and then go from there.     Helpful CDC links  for Novel Coronavirus / COVID-19 illness:  General info: [email or web address]  Patients who are sick: guidelines for self-isolation, when to return to normal activities, etc  [email or web address]  Have your employer reference the following:  [email or web address]  Please  call or message [place] if any concerns!   If you experience worsening illness, especially struggling to breathe, please seek emergency medical attention! Most cases of coronavirus infection seem to be relatively mild, but it can cause severe respiratory illness in some people which may require intensive medical care. If you are truly worried, DO NOT WAIT to seek care!       Follow-up plan: Return if symptoms worsen or fail to improve.                                                 ################################################# ################################################# ################################################# #################################################    Current Meds  Medication Sig  . Accu-Chek FastClix Lancets MISC USE UP TO 3 TIMES DAILY  . ACCU-CHEK GUIDE test strip USE UP TO 3 TIMES DAILY  . blood glucose meter kit and supplies Dispense based on patient and insurance preference. Use up to three times daily as directed. (FOR ICD-10 E10.9, E11.9).  Marland Kitchen buPROPion (WELLBUTRIN XL) 300 MG 24 hr tablet Take 1 tablet (300 mg total) by mouth daily.  . dapagliflozin propanediol (FARXIGA) 10 MG TABS tablet Take 10 mg by mouth daily. Must schedule appt for refills  . gabapentin (NEURONTIN) 300 MG capsule Take 1 capsule (300 mg total) by mouth 2 (two) times daily.  . Insulin Degludec (TRESIBA FLEXTOUCH) 200 UNIT/ML SOPN Inject 56 Units into the skin daily.  Marland Kitchen olmesartan-hydrochlorothiazide (BENICAR HCT) 20-12.5 MG tablet Take 1 tablet by mouth daily.  . sertraline (ZOLOFT) 100 MG tablet Take one and one/half tablet daily.  . simvastatin (ZOCOR) 10 MG tablet Take 1 tablet (10 mg total) by mouth daily.  . Topiramate ER (TROKENDI XR) 50 MG CP24 Take 1 tablet by mouth daily.  . TRULICITY 1.5 UR/4.2HC SOPN INJECT 1.5 MG INTO THE SKIN ONCE A WEEK.  . Vitamin D, Ergocalciferol, (DRISDOL) 1.25 MG (50000 UT) CAPS capsule Take 1 capsule  (50,000 Units total) by mouth every 7 (seven) days.    Allergies  Allergen Reactions  . Eggs Or Egg-Derived Products Hives and Other (See Comments)    FLU VACCINE: caused hives    . Influenza Vaccines Hives    Vaccine from Egg derived product causes Hives  . Effexor [Venlafaxine]     "felt weird"  . Metformin And Related Swelling    ANGIOEDEMA       Review of Systems:  Constitutional: +recent illness  HEENT: +headache, no vision change  Cardiac: No  chest pain, No  pressure, No palpitations  Respiratory:  +shortness of breath. +Cough  Gastrointestinal: No  abdominal pain, no change on bowel habits  Musculoskeletal: No new myalgia/arthralgia  Skin: No  Rash  Neurologic: No  weakness, No  Dizziness  Psychiatric: No  concerns with depression, No  concerns with anxiety  Exam:  BP 134/79 (BP Location: Left Arm, Patient Position: Sitting, Cuff Size: Large)   Pulse 83   Temp 98.4 F (36.9 C) (Oral)   Wt 263 lb 12.8 oz (119.7 kg)   SpO2 100%   BMI 48.25 kg/m   Constitutional: VS see above. General Appearance: alert, well-developed, well-nourished, NAD  Eyes: Normal lids  and conjunctive, non-icteric sclera  Ears, Nose, Mouth, Throat: MMM, Normal external inspection ears/nares/mouth/lips/gums.  Neck: No masses, trachea midline.   Respiratory: Normal respiratory effort. no wheeze, no rhonchi, no rales  Cardiovascular: S1/S2 normal, no murmur, no rub/gallop auscultated. RRR.   Musculoskeletal: Gait normal. Symmetric and independent movement of all extremities  Abdominal: non-tender, non-distended, no appreciable organomegaly, neg Murphy's, BS WNLx4  Neurological: Normal balance/coordination. No tremor.  Skin: warm, dry, intact.   Psychiatric: Normal judgment/insight. Normal mood and affect. Oriented x3.       Visit summary with medication list and pertinent instructions was printed for patient to review, patient was advised to alert [place] if any updates are  needed. All questions at time of visit were answered - patient instructed to contact office with any additional concerns. ER/RTC precautions were reviewed with the patient and understanding verbalized.    Please note: voice recognition software was used to produce this document, and typos may escape review. Please contact Dr. Sheppard Coil for any needed clarifications.    Follow up plan: Return if symptoms worsen or fail to improve.

## 2018-12-08 NOTE — Progress Notes (Deleted)
Jasmine [name] is a 53 y.o. female who presents to [place]: [place] today for shortness of breath and chest pain.  Patient was seen by her primary care provider my partner Iran Planas PA earlier today via virtual video visit.  At that time she had been complaining of a one-week history of shortness of breath.  She feels significant fatigue and shortness of breath with exertion such as walking moving or even bending forward.  She notes that her symptoms been bad enough that she is not been able to work since Thursday, April 23.  She does not have a history of asthma and has not been wheezing.  She does have a mild nonproductive cough.  She notes her symptoms are slightly improved with forward bending.  She is tried over-the-counter medications including Mucinex OTC sinus medications which have not helped.  She has a mild headache as well.  She denies any fevers chills body aches.  She denies any sick contacts.  She denies any lower extremity swelling.  Medical history significant for diabetes hypertension CKD and hyperlipidemia.  Her primary care provider is concern for pericarditis given improved symptoms with leaning forward. ROS as above:  Exam:  There were no vitals taken for this visit. Wt Readings from Last 5 Encounters:  12/08/18 249 lb (112.9 kg)  11/16/18 250 lb (113.4 kg)  08/16/18 259 lb (117.5 kg)  07/26/18 260 lb (117.9 kg)  07/12/18 258 lb (117 kg)    Gen: Well NAD HEENT: EOMI,  MMM Lungs: Normal work of breathing. CTABL Heart: RRR no MRG Abd: NABS, Soft. Nondistended, Nontender Exts: Brisk capillary refill, warm and well perfused.   Lab and Radiology Results No results found for this or any previous visit (from the past 72 hour(s)). No results found.    Assessment and Plan: 53 y.o. female with ***  PDMP not reviewed this encounter. No orders of  the defined types were placed in this encounter.  No orders of the defined types were placed in this encounter.    Historical information moved to improve visibility of documentation.  Past Medical History:  Diagnosis Date  . Anxiety   . Arthritis    "knees" (03/28/2014)  . Chronic renal insufficiency    stage II (mild)  . Depression   . Headache    with high blood sugar  . Hypercholesteremia   . Hypertension   . Numbness and tingling of right arm and leg   . Seasonal allergies   . Type II diabetes mellitus ([place])   . Umbilical hernia without mention of obstruction or gangrene   . Wears glasses    Past Surgical History:  Procedure Laterality Date  . INSERTION OF MESH N/A 03/28/2014   Procedure: INSERTION OF MESH;  Surgeon: Ralene Ok, MD;  Location: [place];  Service: General;  Laterality: N/A;  . INSERTION OF MESH N/A 05/08/2015   Procedure: INSERTION OF MESH;  Surgeon: Ralene Ok, MD;  Location: [place];  Service: General;  Laterality: N/A;  . LAPAROSCOPIC LYSIS OF ADHESIONS N/A 05/08/2015   Procedure: LAPAROSCOPIC LYSIS OF ADHESIONS;  Surgeon: Ralene Ok, MD;  Location: [place];  Service: General;  Laterality: N/A;  . MULTIPLE TOOTH EXTRACTIONS  ~ 2003  . TONSILLECTOMY    . [place]  . VENTRAL HERNIA REPAIR N/A 03/28/2014   Procedure: LAPAROSCOPIC VENTRAL HERNIA;  Surgeon: Ralene Ok, MD;  Location: [place];  Service: General;  Laterality: N/A;  . VENTRAL HERNIA REPAIR N/A 05/08/2015   Procedure: LAPAROSCOPIC VENTRAL HERNIA REPAIR WITH MESH;  Surgeon: Ralene Ok, MD;  Location: [place];  Service: General;  Laterality: N/A;   Social History   Tobacco Use  . Smoking status: Former Smoker    Packs/day: 0.25    Years: 26.00    Pack years: 6.50    Types: Cigarettes  . Smokeless tobacco: Never Used  Substance Use Topics  . Alcohol use: Yes    Comment: "have a drink a few times/year"   family history includes Alcohol abuse in her brother;  Bipolar disorder in her brother; Cancer in her father and sister; Diabetes in her brother, brother, father, mother, and sister; Drug abuse in her brother; High Cholesterol in her mother; Obesity in her mother.  Medications: Current Outpatient Medications  Medication Sig Dispense Refill  . Accu-Chek FastClix Lancets MISC USE UP TO 3 TIMES DAILY    . ACCU-CHEK GUIDE test strip USE UP TO 3 TIMES DAILY    . blood glucose meter kit and supplies Dispense based on patient and insurance preference. Use up to three times daily as directed. (FOR ICD-10 E10.9, E11.9). 1 each 0  . buPROPion (WELLBUTRIN XL) 300 MG 24 hr tablet Take 1 tablet (300 mg total) by mouth daily. 90 tablet 1  . dapagliflozin propanediol (FARXIGA) 10 MG TABS tablet Take 10 mg by mouth daily. Must schedule appt for refills 30 tablet 0  . gabapentin (NEURONTIN) 300 MG capsule Take 1 capsule (300 mg total) by mouth 2 (two) times daily. 60 capsule 1  . Insulin Degludec (TRESIBA FLEXTOUCH) 200 UNIT/ML SOPN Inject 56 Units into the skin daily. 15 pen 3  . olmesartan-hydrochlorothiazide (BENICAR HCT) 20-12.5 MG tablet Take 1 tablet by mouth daily. 90 tablet 1  . sertraline (ZOLOFT) 100 MG tablet Take one and one/half tablet daily. 135 tablet 1  . simvastatin (ZOCOR) 10 MG tablet Take 1 tablet (10 mg total) by mouth daily. 90 tablet 4  . Topiramate ER (TROKENDI XR) 50 MG CP24 Take 1 tablet by mouth daily. 90 capsule 1  . TRULICITY 1.5 RU/0.4VW SOPN INJECT 1.5 MG INTO THE SKIN ONCE A WEEK. 6 pen 1  . Vitamin D, Ergocalciferol, (DRISDOL) 1.25 MG (50000 UT) CAPS capsule Take 1 capsule (50,000 Units total) by mouth every 7 (seven) days. 12 capsule 0   No current facility-administered medications for this visit.    Allergies  Allergen Reactions  . Eggs Or Egg-Derived Products Hives and Other (See Comments)    FLU VACCINE: caused hives    . Influenza Vaccines Hives    Vaccine from Egg derived product causes Hives  . Effexor [Venlafaxine]      "felt weird"  . Metformin And Related Swelling    ANGIOEDEMA     Discussed warning signs or symptoms. Please see discharge instructions. Patient expresses understanding.

## 2018-12-08 NOTE — Patient Instructions (Addendum)
I think most likely explanation is a viral respiratory infection. Please follow isolation guidelines from CDC: self-isolate for at least 7 days PLUS 3 days after the symptoms are gone.   Possibility of a blood clot in the lung is about 1.3%. If D-Dimer test is positive, we will get you set up for a CT of the chest. If D-Dimer test is negative, we can be confident there is NO clot.   Let's get labs today and then go from there.     Helpful CDC links for Novel Coronavirus / COVID-19 illness:  General info: http://espinoza.biz/.pdf  Patients who are sick: guidelines for self-isolation, when to return to normal activities, etc  DiscoHelp.si.html WirelessPromos.com.cy.pdf  Have your employer reference the following:  HardwareParty.no  Please call or message [place] if any concerns!   If you experience worsening illness, especially struggling to breathe, please seek emergency medical attention! Most cases of coronavirus infection seem to be relatively mild, but it can cause severe respiratory illness in some people which may require intensive medical care. If you are truly worried, DO NOT WAIT to seek care!

## 2018-12-09 LAB — CBC WITH DIFFERENTIAL/PLATELET
Absolute Monocytes: 314 cells/uL (ref 200–950)
Basophils Absolute: 39 cells/uL (ref 0–200)
Basophils Relative: 0.7 %
Eosinophils Absolute: 198 cells/uL (ref 15–500)
Eosinophils Relative: 3.6 %
HCT: 40.8 % (ref 35.0–45.0)
Hemoglobin: 13.1 g/dL (ref 11.7–15.5)
Lymphs Abs: 3009 cells/uL (ref 850–3900)
MCH: 23.6 pg — ABNORMAL LOW (ref 27.0–33.0)
MCHC: 32.1 g/dL (ref 32.0–36.0)
MCV: 73.5 fL — ABNORMAL LOW (ref 80.0–100.0)
MPV: 11.6 fL (ref 7.5–12.5)
Monocytes Relative: 5.7 %
Neutro Abs: 1942 cells/uL (ref 1500–7800)
Neutrophils Relative %: 35.3 %
Platelets: 312 10*3/uL (ref 140–400)
RBC: 5.55 10*6/uL — ABNORMAL HIGH (ref 3.80–5.10)
RDW: 15.7 % — ABNORMAL HIGH (ref 11.0–15.0)
Total Lymphocyte: 54.7 %
WBC: 5.5 10*3/uL (ref 3.8–10.8)

## 2018-12-09 LAB — BASIC METABOLIC PANEL WITH GFR
BUN/Creatinine Ratio: 11 (calc) (ref 6–22)
BUN: 14 mg/dL (ref 7–25)
CO2: 27 mmol/L (ref 20–32)
Calcium: 9.9 mg/dL (ref 8.6–10.4)
Chloride: 107 mmol/L (ref 98–110)
Creat: 1.3 mg/dL — ABNORMAL HIGH (ref 0.50–1.05)
GFR, Est African American: 55 mL/min/{1.73_m2} — ABNORMAL LOW (ref >=60)
GFR, Est Non African American: 47 mL/min/{1.73_m2} — ABNORMAL LOW (ref >=60)
Glucose, Bld: 149 mg/dL — ABNORMAL HIGH (ref 65–99)
Potassium: 5 mmol/L (ref 3.5–5.3)
Sodium: 140 mmol/L (ref 135–146)

## 2018-12-09 LAB — D-DIMER, QUANTITATIVE: D-Dimer, Quant: 0.32 mcg/mL FEU (ref <0.50)

## 2018-12-12 ENCOUNTER — Encounter: Payer: Self-pay | Admitting: Physician Assistant

## 2018-12-12 NOTE — Telephone Encounter (Signed)
Ok to establish care.

## 2018-12-19 ENCOUNTER — Encounter: Payer: Self-pay | Admitting: Physician Assistant

## 2018-12-20 ENCOUNTER — Telehealth: Payer: Self-pay | Admitting: Neurology

## 2018-12-20 NOTE — Telephone Encounter (Signed)
Jasmine [name] forms completed and faxed with office notes, labs, xrays, and medication/problem list to [phone] with confirmation received.   Mychart message sent to patient making her aware that forms completed and sent.

## 2018-12-22 ENCOUNTER — Encounter: Payer: Self-pay | Admitting: Physician Assistant

## 2018-12-22 NOTE — Telephone Encounter (Signed)
Spoke with patient and confirmed that paperwork was sent in to company with confirmation received. She states they received paperwork, but not office visit notes, but they were sent at the same time. I offered to refax. Then she stated she was not happy with her return to work date. She is still having upper respiratory symptoms. I tried to explain to patient, but since she is still having symptoms I made a visit for her to discuss symptoms with [facility] tomorrow morning.

## 2018-12-23 ENCOUNTER — Encounter: Payer: Self-pay | Admitting: Neurology

## 2018-12-23 ENCOUNTER — Ambulatory Visit (INDEPENDENT_AMBULATORY_CARE_PROVIDER_SITE_OTHER): Payer: 59 | Admitting: Physician Assistant

## 2018-12-23 ENCOUNTER — Encounter: Payer: Self-pay | Admitting: Physician Assistant

## 2018-12-23 VITALS — Temp 96.1°F | Ht 62.0 in

## 2018-12-23 DIAGNOSIS — J40 Bronchitis, not specified as acute or chronic: Secondary | ICD-10-CM

## 2018-12-23 DIAGNOSIS — J329 Chronic sinusitis, unspecified: Secondary | ICD-10-CM | POA: Diagnosis not present

## 2018-12-23 MED ORDER — AZITHROMYCIN 250 MG PO TABS: ORAL_TABLET | ORAL | 0 refills | Status: DC

## 2018-12-23 MED ORDER — PREDNISONE 50 MG PO TABS: ORAL_TABLET | ORAL | 0 refills | Status: DC

## 2018-12-23 MED ORDER — BENZONATATE 200 MG PO CAPS: 200.0000 mg | ORAL_CAPSULE | Freq: Two times a day (BID) | ORAL | 0 refills | Status: DC | PRN

## 2018-12-23 NOTE — Telephone Encounter (Signed)
Note written and faxed with office note to [facility] with confirmation received to [phone].

## 2018-12-23 NOTE — Progress Notes (Signed)
Patient having ongoing URI symptoms. Taking Delsym for cough. No fevers.

## 2018-12-23 NOTE — Telephone Encounter (Signed)
Note is done. Can send with letter to be wirtten out until 5/19.

## 2018-12-23 NOTE — Addendum Note (Signed)
Addended by: Silvio Pate on: 12/23/2018 08:44 AM   Modules accepted: Orders

## 2018-12-23 NOTE — Progress Notes (Signed)
Patient ID: Stormy CardYolanda E Escudero, female   DOB: May 20, 1966, 53 y.o.   MRN: [id number]  .Marland Kitchen.Virtual Visit via Telephone Note  I connected with Stormy CardYolanda E Plant on 12/23/18 at  8:10 AM EDT by telephone and verified that I am speaking with the correct person using two identifiers.  Location: Patient: home Provider: home   I discussed the limitations, risks, security and privacy concerns of performing an evaluation and management service by telephone and the availability of in person appointments. I also discussed with the patient that there may be a patient responsible charge related to this service. The patient expressed understanding and agreed to proceed.   History of Present Illness: Pt is a 53 yo female with uncontrolled T2DM, HTN, HLD who calls into the clinic with cough and SOB that have not resolved since end of April. She was seen in office on 4/30 and had normal labs and CXR. She is not running a fever. She has a dry to wet cough and feels very winded when she tries to walk or do anything. She has been self isolating for 14 days due to COVID pandemic. She continues to have a lot of facial pressure and sinus drainage.  Albuterol inhaler helped some. She is taking deslym around the clock with little benefit. No fever. She was told she had some costochondritis before but treated symptomatically. She is worried and does not want to go into work like this.   .. Active Ambulatory Problems    Diagnosis Date Noted  . Depression 02/09/2010  . ALLERGIC RHINITIS 02/09/2010  . Morbid obesity with BMI of 50.0-59.9, adult (HCC) 06/03/2012  . Hyperlipidemia 11/27/2013  . Primary osteoarthritis of both knees 11/27/2013  . Chronic renal insufficiency, stage II (mild) 11/28/2013  . Umbilical hernia without obstruction and without gangrene 02/02/2014  . Ventral hernia, recurrent 03/28/2014  . Essential hypertension 01/09/2015  . Diabetic retinopathy (HCC) 03/12/2016  . Hyperlipidemia associated with type 2  diabetes mellitus (HCC) 06/17/2016  . Anxiety state 06/17/2016  . Tobacco use 08/13/2016  . Micturition syncope 08/13/2016  . Vitamin D deficiency 11/27/2016  . B12 deficiency 11/27/2016  . Frequent headaches 01/05/2017  . Menopause 05/13/2017  . Severe episode of recurrent major depressive disorder, without psychotic features (HCC) 05/19/2017  . Generalized anxiety disorder 05/19/2017  . Severe major depression, single episode, without psychotic features (HCC) 05/26/2017  . Type 2 diabetes mellitus with chronic kidney disease, with long-term current use of insulin (HCC) 07/12/2017  . Acute stress reaction 03/09/2018  . Class 3 severe obesity due to excess calories with serious comorbidity and body mass index (BMI) of 45.0 to 49.9 in adult [facility](HCC) 11/21/2018   Resolved Ambulatory Problems    Diagnosis Date Noted  . No Resolved Ambulatory Problems   Past Medical History:  Diagnosis Date  . Anxiety   . Arthritis   . Chronic renal insufficiency   . Headache   . Hypercholesteremia   . Hypertension   . Numbness and tingling of right arm and leg   . Seasonal allergies   . Type II diabetes mellitus (HCC)   . Umbilical hernia without mention of obstruction or gangrene   . Wears glasses    Reviewed med, allergy, problem list.     Observations/Objective: No acute distress.  Normal appearance.  Normal breathing.  Dry/wet cough heard on exam.   .. Today's Vitals   12/23/18 0800  Temp: (!) 96.1 F (35.6 C)  TempSrc: Oral  Height: 5\' 2"  (1.575 m)  Body mass index is 48.25 kg/m.   Assessment and Plan: Marland KitchenMarland KitchenTitianna was seen today for cough.  Diagnoses and all orders for this visit:  Sinobronchitis -     azithromycin (ZITHROMAX) 250 MG tablet; Take 2 tablets now and then one tablet for 4 days. -     predniSONE (DELTASONE) 50 MG tablet; Take one tablet for daily for 5 days. -     benzonatate (TESSALON) 200 MG capsule; Take 1 capsule (200 mg total) by mouth 2 (two) times daily  as needed for cough.   At this point I do think she has some sinobronchitis. Continue delsym and albuterol inhaler as needed. Added zpak and prednisone for 5 days. Tessalon pearls to use as needed. Hopefully over the weekend she will see some improvement. Give me up date Monday. Written out of work until Tuesday. Rest and hydrate.   Follow Up Instructions:    I discussed the assessment and treatment plan with the patient. The patient was provided an opportunity to ask questions and all were answered. The patient agreed with the plan and demonstrated an understanding of the instructions.   The patient was advised to call back or seek an in-person evaluation if the symptoms worsen or if the condition fails to improve as anticipated.  I provided  25 minutes of non-face-to-face time during this encounter.   Tandy Gaw, PA-C

## 2018-12-26 ENCOUNTER — Encounter: Payer: Self-pay | Admitting: Physician Assistant

## 2019-01-05 ENCOUNTER — Encounter: Payer: Self-pay | Admitting: Physician Assistant

## 2019-01-06 NOTE — Telephone Encounter (Signed)
Letter faxed to [place] at [phone] with confirmation received.

## 2019-01-06 NOTE — Telephone Encounter (Signed)
Ok to print her letter and send to short term disability.

## 2019-01-23 ENCOUNTER — Other Ambulatory Visit (INDEPENDENT_AMBULATORY_CARE_PROVIDER_SITE_OTHER): Payer: Self-pay | Admitting: Family Medicine

## 2019-01-23 DIAGNOSIS — E1165 Type 2 diabetes mellitus with hyperglycemia: Secondary | ICD-10-CM

## 2019-01-23 DIAGNOSIS — Z794 Long term (current) use of insulin: Secondary | ICD-10-CM

## 2019-02-07 ENCOUNTER — Encounter: Payer: Self-pay | Admitting: Physician Assistant

## 2019-02-07 ENCOUNTER — Other Ambulatory Visit: Payer: Self-pay | Admitting: Internal Medicine

## 2019-02-07 DIAGNOSIS — E1122 Type 2 diabetes mellitus with diabetic chronic kidney disease: Secondary | ICD-10-CM

## 2019-02-07 DIAGNOSIS — F330 Major depressive disorder, recurrent, mild: Secondary | ICD-10-CM

## 2019-02-07 DIAGNOSIS — Z794 Long term (current) use of insulin: Secondary | ICD-10-CM

## 2019-02-07 DIAGNOSIS — R519 Headache, unspecified: Secondary | ICD-10-CM

## 2019-02-07 DIAGNOSIS — E559 Vitamin D deficiency, unspecified: Secondary | ICD-10-CM

## 2019-02-07 DIAGNOSIS — I10 Essential (primary) hypertension: Secondary | ICD-10-CM

## 2019-02-07 DIAGNOSIS — F411 Generalized anxiety disorder: Secondary | ICD-10-CM

## 2019-02-13 MED ORDER — BUPROPION HCL ER (XL) 300 MG PO TB24: 300.0000 mg | ORAL_TABLET | Freq: Every day | ORAL | 1 refills | Status: DC

## 2019-02-13 MED ORDER — OLMESARTAN MEDOXOMIL-HCTZ 20-12.5 MG PO TABS: 1.0000 | ORAL_TABLET | Freq: Every day | ORAL | 1 refills | Status: DC

## 2019-02-13 MED ORDER — VITAMIN D (ERGOCALCIFEROL) 1.25 MG (50000 UNIT) PO CAPS: 50000.0000 [IU] | ORAL_CAPSULE | ORAL | 0 refills | Status: DC

## 2019-02-13 MED ORDER — TROKENDI XR 50 MG PO CP24: 1.0000 | ORAL_CAPSULE | Freq: Every day | ORAL | 1 refills | Status: DC

## 2019-02-13 MED ORDER — SERTRALINE HCL 100 MG PO TABS: ORAL_TABLET | ORAL | 1 refills | Status: DC

## 2019-02-17 ENCOUNTER — Encounter (HOSPITAL_COMMUNITY): Payer: Self-pay | Admitting: Psychiatry

## 2019-02-17 ENCOUNTER — Ambulatory Visit (INDEPENDENT_AMBULATORY_CARE_PROVIDER_SITE_OTHER): Payer: 59 | Admitting: Psychiatry

## 2019-02-17 DIAGNOSIS — F411 Generalized anxiety disorder: Secondary | ICD-10-CM | POA: Diagnosis not present

## 2019-02-17 DIAGNOSIS — F4310 Post-traumatic stress disorder, unspecified: Secondary | ICD-10-CM | POA: Diagnosis not present

## 2019-02-17 DIAGNOSIS — F332 Major depressive disorder, recurrent severe without psychotic features: Secondary | ICD-10-CM

## 2019-02-17 DIAGNOSIS — F322 Major depressive disorder, single episode, severe without psychotic features: Secondary | ICD-10-CM

## 2019-02-17 NOTE — Progress Notes (Addendum)
[facility] tele follow up visit   Patient Identification: Jasmine [name] MRN:  [id number] Date of Evaluation:  02/17/2019 Referral Source: Her therapist [facility]  Chief Complaint:    anxious, need paperwork to be off work  Visit Diagnosis:    ICD-10-CM   1. Severe major depression, single episode, without psychotic features ([place])  F32.2   2. Generalized anxiety disorder  F41.1   3. PTSD (post-traumatic stress disorder)  F43.10   4. Anxiety state  F41.1   I connected with Jasmine [name] on 02/17/19 at 12:15 PM EDT by telephone and verified that I am speaking with the correct person using two identifiers.   I discussed the limitations, risks, security and privacy concerns of performing an evaluation and management service by telephone and the availability of in person appointments. I also discussed with the patient that there may be a patient responsible charge related to this service. The patient expressed understanding and agreed to proceed.  History of Present Illness: Jasmine [name] is 53 -year-old African-American married,  Patient has wanted to come for earlier visit today and called to make this appointment I connected with Jasmine [name] on 11/03/18 at 10:45 AM EDT by telephone and verified that I am speaking with the correct person using two identifiers.   I discussed the limitations, risks, security and privacy concerns of performing an evaluation and management service by telephone and the availability of in person appointments. I also discussed with the patient that there may be a patient responsible charge related to this service. The patient expressed understanding and agreed to proceed  She started having panic at work, couldn't work or focus and was sent home, says her husband chemo may not work and they may do hospice . That upseted her , she felt panicky and want time off from work  Prior to that she was working and was given off time due to covid and high  risk   Tolerating meds. Says meds do help but cant add up work stress to current family stress   Works at [employer] and has gone thru some harrassment concerns in the past.   Aggravating factor: husband sickness,job stress per history Modifying factor: few friends  Past Psychiatric History: Patient denies any history of psychiatric inpatient treatment, suicidal attempt, self abusive behavior, traumatic brain injury or any history of abuse.  Previous Psychotropic Medications: Yes   Substance Abuse History in the last 12 months:  No.  Consequences of Substance Abuse: Negative  Past Medical History:  Past Medical History:  Diagnosis Date  . Anxiety   . Arthritis    "knees" (03/28/2014)  . Chronic renal insufficiency    stage II (mild)  . Depression   . Headache    with high blood sugar  . Hypercholesteremia   . Hypertension   . Numbness and tingling of right arm and leg   . Seasonal allergies   . Type II diabetes mellitus ([place])   . Umbilical hernia without mention of obstruction or gangrene   . Wears glasses     Past Surgical History:  Procedure Laterality Date  . INSERTION OF MESH N/A 03/28/2014   Procedure: INSERTION OF MESH;  Surgeon: Ralene Ok, MD;  Location: [place];  Service: General;  Laterality: N/A;  . INSERTION OF MESH N/A 05/08/2015   Procedure: INSERTION OF MESH;  Surgeon: Ralene Ok, MD;  Location: [place];  Service: General;  Laterality: N/A;  . LAPAROSCOPIC LYSIS OF ADHESIONS N/A 05/08/2015   Procedure:  LAPAROSCOPIC LYSIS OF ADHESIONS;  Surgeon: Ralene Ok, MD;  Location: [place];  Service: General;  Laterality: N/A;  . MULTIPLE TOOTH EXTRACTIONS  ~ 2003  . TONSILLECTOMY    . [place]  . VENTRAL HERNIA REPAIR N/A 03/28/2014   Procedure: LAPAROSCOPIC VENTRAL HERNIA;  Surgeon: Ralene Ok, MD;  Location: [place];  Service: General;  Laterality: N/A;  . VENTRAL HERNIA REPAIR N/A 05/08/2015   Procedure: LAPAROSCOPIC VENTRAL HERNIA  REPAIR WITH MESH;  Surgeon: Ralene Ok, MD;  Location: [place];  Service: General;  Laterality: N/A;    Family Psychiatric History: Viewed  Family History:  Family History  Problem Relation Age of Onset  . Diabetes Mother   . High Cholesterol Mother   . Obesity Mother   . Diabetes Father   . Cancer Father        Prostate cancer  . Cancer Sister        Cervical cancer  . Diabetes Brother   . Bipolar disorder Brother   . Alcohol abuse Brother   . Drug abuse Brother   . Diabetes Sister   . Diabetes Brother     Social History:   Social History   Socioeconomic History  . Marital status: Married    Spouse name: [name]  . Number of children: 1  . Years of education: Not on file  . Highest education level: Not on file  Occupational History  . Not on file  Social Needs  . Financial resource strain: Not on file  . Food insecurity    Worry: Not on file    Inability: Not on file  . Transportation needs    Medical: Not on file    Non-medical: Not on file  Tobacco Use  . Smoking status: Former Smoker    Packs/day: 0.25    Years: 26.00    Pack years: 6.50    Types: Cigarettes  . Smokeless tobacco: Never Used  Substance and Sexual Activity  . Alcohol use: Yes    Comment: "have a drink a few times/year"  . Drug use: No  . Sexual activity: Not Currently  Lifestyle  . Physical activity    Days per week: Not on file    Minutes per session: Not on file  . Stress: Not on file  Relationships  . Social Herbalist on phone: Not on file    Gets together: Not on file    Attends religious service: Not on file    Active member of club or organization: Not on file    Attends meetings of clubs or organizations: Not on file    Relationship status: Not on file  Other Topics Concern  . Not on file  Social History Narrative  . Not on file     Allergies:   Allergies  Allergen Reactions  . Eggs Or Egg-Derived Products Hives and Other (See Comments)     FLU VACCINE: caused hives    . Influenza Vaccines Hives    Vaccine from Egg derived product causes Hives  . Effexor [Venlafaxine]     "felt weird"  . Metformin And Related Swelling    ANGIOEDEMA    Metabolic Disorder Labs: Lab Results  Component Value Date   HGBA1C 10.8 (H) 06/14/2018   MPG 384 (H) 01/09/2015   MPG 160 (H) 03/28/2014   No results found for: PROLACTIN Lab Results  Component Value Date   CHOL 174 06/14/2018   TRIG 123 06/14/2018  HDL 46 06/14/2018   CHOLHDL 4 09/09/2017   VLDL 41.4 (H) 09/09/2017   LDLCALC 103 (H) 06/14/2018   LDLCALC 98 10/07/2017     Current Medications: Current Outpatient Medications  Medication Sig Dispense Refill  . Accu-Chek FastClix Lancets MISC USE UP TO 3 TIMES DAILY    . ACCU-CHEK GUIDE test strip USE UP TO 3 TIMES DAILY    . azithromycin (ZITHROMAX) 250 MG tablet Take 2 tablets now and then one tablet for 4 days. DX: J32.9, J40 6 tablet 0  . benzonatate (TESSALON) 200 MG capsule Take 1 capsule (200 mg total) by mouth 2 (two) times daily as needed for cough. 20 capsule 0  . blood glucose meter kit and supplies Dispense based on patient and insurance preference. Use up to three times daily as directed. (FOR ICD-10 E10.9, E11.9). 1 each 0  . buPROPion (WELLBUTRIN XL) 300 MG 24 hr tablet Take 1 tablet (300 mg total) by mouth daily. 90 tablet 1  . dapagliflozin propanediol (FARXIGA) 10 MG TABS tablet Take 10 mg by mouth daily. Must schedule appt for refills 30 tablet 0  . gabapentin (NEURONTIN) 300 MG capsule Take 1 capsule (300 mg total) by mouth 2 (two) times daily. 60 capsule 1  . Insulin Degludec (TRESIBA FLEXTOUCH) 200 UNIT/ML SOPN Inject 56 Units into the skin daily. 15 pen 3  . olmesartan-hydrochlorothiazide (BENICAR HCT) 20-12.5 MG tablet Take 1 tablet by mouth daily. 90 tablet 1  . predniSONE (DELTASONE) 50 MG tablet Take one tablet for daily for 5 days. 5 tablet 0  . sertraline (ZOLOFT) 100 MG tablet Take one and one/half  tablet daily. 135 tablet 1  . simvastatin (ZOCOR) 10 MG tablet Take 1 tablet (10 mg total) by mouth daily. 90 tablet 4  . Topiramate ER (TROKENDI XR) 50 MG CP24 Take 1 tablet by mouth daily. 90 capsule 1  . TRULICITY 1.5 EY/8.1KG SOPN INJECT 1.5 MG INTO THE SKIN ONCE A WEEK. 6 pen 1  . Vitamin D, Ergocalciferol, (DRISDOL) 1.25 MG (50000 UT) CAPS capsule Take 1 capsule (50,000 Units total) by mouth every 7 (seven) days. 12 capsule 0   No current facility-administered medications for this visit.       Psychiatric Specialty Exam: Review of Systems  Cardiovascular: Negative for chest pain.  Skin: Negative for rash.  Psychiatric/Behavioral: The patient is nervous/anxious.     There were no vitals taken for this visit.There is no height or weight on file to calculate BMI.  General Appearance:  Eye Contact:   Speech:  Slow  Volume:  Decreased  Mood: anxious  Affect congruent  Thought Process:  Goal Directed  Orientation:  Full (Time, Place, and Person)  Thought Content:  Rumination  Suicidal Thoughts:  No  Homicidal Thoughts:  No  Memory:  Immediate;   Fair Recent;   Fair Remote;   Fair  Judgement:  Good  Insight:  Good  Psychomotor Activity:  Concentration:  Concentration: Fair and Attention Span: Fair  Recall:  Good  Fund of Knowledge:Good  Language: Good  Akathisia:  No  Handed:  Right  AIMS (if indicated):  0  Assets:  Communication Skills Desire for Improvement Housing  ADL's:  Intact  Cognition: WNL  Sleep:  fair    Treatment Plan Summary: Major depressive disorder, recurrent severe feels overwhelmed due to current husband situation. 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 The patient was provided an opportunity to ask questions and all were answered. The patient agreed with the plan and demonstrated an understanding of the instructions.   The patient was advised to call back or seek an in-person evaluation if the symptoms worsen or if the condition fails to improve as anticipated.  I provided 15 minutes of non-face-to-face time during this encounter. Fu 4-6 w or earlier if needed  Merian Capron, MD 7/10/202012:18 PM

## 2019-02-23 ENCOUNTER — Other Ambulatory Visit: Payer: Self-pay | Admitting: Neurology

## 2019-02-23 DIAGNOSIS — Z794 Long term (current) use of insulin: Secondary | ICD-10-CM

## 2019-02-23 DIAGNOSIS — E1122 Type 2 diabetes mellitus with diabetic chronic kidney disease: Secondary | ICD-10-CM

## 2019-02-23 MED ORDER — FARXIGA 10 MG PO TABS: 10.0000 mg | ORAL_TABLET | Freq: Every day | ORAL | 0 refills | Status: DC

## 2019-02-23 NOTE — Telephone Encounter (Signed)
Wilder Glade resent for 90 days. Per [facility] insurance does not cover 30 day RX.

## 2019-03-20 ENCOUNTER — Encounter (HOSPITAL_COMMUNITY): Payer: Self-pay | Admitting: Psychiatry

## 2019-03-20 ENCOUNTER — Ambulatory Visit (INDEPENDENT_AMBULATORY_CARE_PROVIDER_SITE_OTHER): Payer: 59 | Admitting: Psychiatry

## 2019-03-20 DIAGNOSIS — F322 Major depressive disorder, single episode, severe without psychotic features: Secondary | ICD-10-CM

## 2019-03-20 DIAGNOSIS — F4310 Post-traumatic stress disorder, unspecified: Secondary | ICD-10-CM

## 2019-03-20 DIAGNOSIS — F411 Generalized anxiety disorder: Secondary | ICD-10-CM | POA: Diagnosis not present

## 2019-03-20 NOTE — Progress Notes (Signed)
[facility] tele follow up visit   Patient Identification: Jasmine [name] MRN:  [id number] Date of Evaluation:  03/20/2019 Referral Source: Her therapist [facility] Records I connected with Jasmine [name] on 03/20/19 at 10:15 AM EDT by telephone and verified that I am speaking with the correct person using two identifiers.   I discussed the limitations, risks, security and privacy concerns of performing an evaluation and management service by telephone and the availability of in person appointments. I also discussed with the patient that there may be a patient responsible charge related to this service. The patient expressed understanding and agreed to proceed.    I discussed the assessment and treatment plan with the patient. The patient was provided an opportunity to ask questions and all were answered. The patient agreed with the plan and demonstrated an understanding of the instructions.   The patient was advised to call back or seek an in-person evaluation if the symptoms worsen or if the condition fails to improve as anticipated. Chief Complaint:    depressed,  need paperwork to be off work  Visit Diagnosis:    ICD-10-CM   1. Severe major depression, single episode, without psychotic features ([place])  F32.2   2. Generalized anxiety disorder  F41.1   3. PTSD (post-traumatic stress disorder)  F43.10     History of Present Illness: Jasmine [name] is 53 -year-old African-American married,   Remains depressed, husband now given couple of weeks and is on hospice care. Patient trying to be with there, remains anxious and upset talking to him as he is not taking chemo and is under hospice care She is spending time with him at this difficult time.  Says cannot work and feels panic Her diabetes is not well control due to current stress as well   Tolerating meds. Says meds do help but cant add up work stress to current family stress   Works at [employer] and has gone thru some harrassment concerns in the  past.   Aggravating factor: husband sickness,job stress per history Modifying factor: few friends  Past Psychiatric History: Patient denies any history of psychiatric inpatient treatment, suicidal attempt, self abusive behavior, traumatic brain injury or any history of abuse.  Previous Psychotropic Medications: Yes   Substance Abuse History in the last 12 months:  No.  Consequences of Substance Abuse: Negative  Past Medical History:  Past Medical History:  Diagnosis Date  . Anxiety   . Arthritis    "knees" (03/28/2014)  . Chronic renal insufficiency    stage II (mild)  . Depression   . Headache    with high blood sugar  . Hypercholesteremia   . Hypertension   . Numbness and tingling of right arm and leg   . Seasonal allergies   . Type II diabetes mellitus ([place])   . Umbilical hernia without mention of obstruction or gangrene   . Wears glasses     Past Surgical History:  Procedure Laterality Date  . INSERTION OF MESH N/A 03/28/2014   Procedure: INSERTION OF MESH;  Surgeon: Ralene Ok, MD;  Location: [place];  Service: General;  Laterality: N/A;  . INSERTION OF MESH N/A 05/08/2015   Procedure: INSERTION OF MESH;  Surgeon: Ralene Ok, MD;  Location: [place];  Service: General;  Laterality: N/A;  . LAPAROSCOPIC LYSIS OF ADHESIONS N/A 05/08/2015   Procedure: LAPAROSCOPIC LYSIS OF ADHESIONS;  Surgeon: Ralene Ok, MD;  Location: [place];  Service: General;  Laterality: N/A;  . MULTIPLE TOOTH EXTRACTIONS  ~ 2003  .  TONSILLECTOMY    . [place]  . VENTRAL HERNIA REPAIR N/A 03/28/2014   Procedure: LAPAROSCOPIC VENTRAL HERNIA;  Surgeon: Ralene Ok, MD;  Location: [place];  Service: General;  Laterality: N/A;  . VENTRAL HERNIA REPAIR N/A 05/08/2015   Procedure: LAPAROSCOPIC VENTRAL HERNIA REPAIR WITH MESH;  Surgeon: Ralene Ok, MD;  Location: [place];  Service: General;  Laterality: N/A;    Family Psychiatric History: Viewed  Family History:   Family History  Problem Relation Age of Onset  . Diabetes Mother   . High Cholesterol Mother   . Obesity Mother   . Diabetes Father   . Cancer Father        Prostate cancer  . Cancer Sister        Cervical cancer  . Diabetes Brother   . Bipolar disorder Brother   . Alcohol abuse Brother   . Drug abuse Brother   . Diabetes Sister   . Diabetes Brother     Social History:   Social History   Socioeconomic History  . Marital status: Married    Spouse name: [name]  . Number of children: 1  . Years of education: Not on file  . Highest education level: Not on file  Occupational History  . Not on file  Social Needs  . Financial resource strain: Not on file  . Food insecurity    Worry: Not on file    Inability: Not on file  . Transportation needs    Medical: Not on file    Non-medical: Not on file  Tobacco Use  . Smoking status: Former Smoker    Packs/day: 0.25    Years: 26.00    Pack years: 6.50    Types: Cigarettes  . Smokeless tobacco: Never Used  Substance and Sexual Activity  . Alcohol use: Yes    Comment: "have a drink a few times/year"  . Drug use: No  . Sexual activity: Not Currently  Lifestyle  . Physical activity    Days per week: Not on file    Minutes per session: Not on file  . Stress: Not on file  Relationships  . Social Herbalist on phone: Not on file    Gets together: Not on file    Attends religious service: Not on file    Active member of club or organization: Not on file    Attends meetings of clubs or organizations: Not on file    Relationship status: Not on file  Other Topics Concern  . Not on file  Social History Narrative  . Not on file     Allergies:   Allergies  Allergen Reactions  . Eggs Or Egg-Derived Products Hives and Other (See Comments)    FLU VACCINE: caused hives    . Influenza Vaccines Hives    Vaccine from Egg derived product causes Hives  . Effexor [Venlafaxine]     "felt weird"  . Metformin  And Related Swelling    ANGIOEDEMA    Metabolic Disorder Labs: Lab Results  Component Value Date   HGBA1C 10.8 (H) 06/14/2018   MPG 384 (H) 01/09/2015   MPG 160 (H) 03/28/2014   No results found for: PROLACTIN Lab Results  Component Value Date   CHOL 174 06/14/2018   TRIG 123 06/14/2018   HDL 46 06/14/2018   CHOLHDL 4 09/09/2017   VLDL 41.4 (H) 09/09/2017   LDLCALC 103 (H) 06/14/2018   LDLCALC 98 10/07/2017  Current Medications: Current Outpatient Medications  Medication Sig Dispense Refill  . Accu-Chek FastClix Lancets MISC USE UP TO 3 TIMES DAILY    . ACCU-CHEK GUIDE test strip USE UP TO 3 TIMES DAILY    . azithromycin (ZITHROMAX) 250 MG tablet Take 2 tablets now and then one tablet for 4 days. DX: J32.9, J40 6 tablet 0  . benzonatate (TESSALON) 200 MG capsule Take 1 capsule (200 mg total) by mouth 2 (two) times daily as needed for cough. 20 capsule 0  . blood glucose meter kit and supplies Dispense based on patient and insurance preference. Use up to three times daily as directed. (FOR ICD-10 E10.9, E11.9). 1 each 0  . buPROPion (WELLBUTRIN XL) 300 MG 24 hr tablet Take 1 tablet (300 mg total) by mouth daily. 90 tablet 1  . dapagliflozin propanediol (FARXIGA) 10 MG TABS tablet Take 10 mg by mouth daily. Must schedule appt for refills 90 tablet 0  . gabapentin (NEURONTIN) 300 MG capsule Take 1 capsule (300 mg total) by mouth 2 (two) times daily. 60 capsule 1  . Insulin Degludec (TRESIBA FLEXTOUCH) 200 UNIT/ML SOPN Inject 56 Units into the skin daily. 15 pen 3  . olmesartan-hydrochlorothiazide (BENICAR HCT) 20-12.5 MG tablet Take 1 tablet by mouth daily. 90 tablet 1  . predniSONE (DELTASONE) 50 MG tablet Take one tablet for daily for 5 days. 5 tablet 0  . sertraline (ZOLOFT) 100 MG tablet Take one and one/half tablet daily. 135 tablet 1  . simvastatin (ZOCOR) 10 MG tablet Take 1 tablet (10 mg total) by mouth daily. 90 tablet 4  . Topiramate ER (TROKENDI XR) 50 MG CP24 Take  1 tablet by mouth daily. 90 capsule 1  . TRULICITY 1.5 IR/6.7EL SOPN INJECT 1.5 MG INTO THE SKIN ONCE A WEEK. 6 pen 1  . Vitamin D, Ergocalciferol, (DRISDOL) 1.25 MG (50000 UT) CAPS capsule Take 1 capsule (50,000 Units total) by mouth every 7 (seven) days. 12 capsule 0   No current facility-administered medications for this visit.       Psychiatric Specialty Exam: Review of Systems  Cardiovascular: Negative for chest pain.  Skin: Negative for rash.  Psychiatric/Behavioral: Positive for depression. The patient is nervous/anxious.     There were no vitals taken for this visit.There is no height or weight on file to calculate BMI.  General Appearance:  Eye Contact:   Speech:  Slow  Volume:  Decreased  Mood: depressed  Affect congruent  Thought Process:  Goal Directed  Orientation:  Full (Time, Place, and Person)  Thought Content:  Rumination  Suicidal Thoughts:  No  Homicidal Thoughts:  No  Memory:  Immediate;   Fair Recent;   Fair Remote;   Fair  Judgement:  Good  Insight:  Good  Psychomotor Activity:  Concentration:  Concentration: Fair and Attention Span: Fair  Recall:  Good  Fund of Knowledge:Good  Language: Good  Akathisia:  No  Handed:  Right  AIMS (if indicated):  0  Assets:  Communication Skills Desire for Improvement Housing  ADL's:  Intact  Cognition: WNL  Sleep:  fair    Treatment Plan Summary: Major depressive disorder, recurrent severe: feels overwhelmed, depressed, feels meds do what they can but it is due to her husband currnet conditon. 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  I discussed the assessment and treatment plan with the patient. The patient was provided an opportunity to ask questions and all were answered. The patient agreed with the plan and demonstrated an understanding of the instructions.   The patient was advised to call back or seek an in-person evaluation if the symptoms worsen or if the condition fails to improve as anticipated.  I provided 15 minutes of non-face-to-face time during this encounter. Fu 3-4   Merian Capron, MD 8/10/202010:24 AM

## 2019-04-05 ENCOUNTER — Encounter (HOSPITAL_COMMUNITY): Payer: Self-pay | Admitting: Psychiatry

## 2019-04-05 ENCOUNTER — Ambulatory Visit (INDEPENDENT_AMBULATORY_CARE_PROVIDER_SITE_OTHER): Payer: 59 | Admitting: Psychiatry

## 2019-04-05 DIAGNOSIS — F410 Panic disorder [episodic paroxysmal anxiety] without agoraphobia: Secondary | ICD-10-CM

## 2019-04-05 DIAGNOSIS — F411 Generalized anxiety disorder: Secondary | ICD-10-CM

## 2019-04-05 DIAGNOSIS — F322 Major depressive disorder, single episode, severe without psychotic features: Secondary | ICD-10-CM

## 2019-04-05 DIAGNOSIS — F4310 Post-traumatic stress disorder, unspecified: Secondary | ICD-10-CM | POA: Diagnosis not present

## 2019-04-05 DIAGNOSIS — F332 Major depressive disorder, recurrent severe without psychotic features: Secondary | ICD-10-CM | POA: Diagnosis not present

## 2019-04-05 NOTE — Progress Notes (Signed)
[facility] tele follow up visit   Patient Identification: Jasmine [name] MRN:  [id number] Date of Evaluation:  04/05/2019 Referral Source: Her therapist [facility] Records    I connected with Jasmine [name] on 04/05/19 at 10:30 AM EDT by telephone and verified that I am speaking with the correct person using two identifiers.  I discussed the limitations, risks, security and privacy concerns of performing an evaluation and management service by telephone and the availability of in person appointments. I also discussed with the patient that there may be a patient responsible charge related to this service. The patient expressed understanding and agreed to proceed.     Chief Complaint:    depressed,    Visit Diagnosis:    ICD-10-CM   1. Severe major depression, single episode, without psychotic features ([place])  F32.2   2. Generalized anxiety disorder  F41.1   3. PTSD (post-traumatic stress disorder)  F43.10     History of Present Illness: Jasmine [name] is 53 -year-old African-American married,   Husband under hospice care and can die any day. Patient struggling with current stress has some family support He is on pain morphine drip Says difficult to see somebody slowly die in front of you  Tolerating meds. Says meds do help but cant add up work stress to current family stress   Works at [employer] and has gone thru some harrassment concerns in the past.   Aggravating factor: husband sickness,job stress per history Modifying factor: few friends  Past Psychiatric History: Patient denies any history of psychiatric inpatient treatment, suicidal attempt, self abusive behavior, traumatic brain injury or any history of abuse.  Previous Psychotropic Medications: Yes   Substance Abuse History in the last 12 months:  No.  Consequences of Substance Abuse: Negative  Past Medical History:  Past Medical History:  Diagnosis Date  . Anxiety   . Arthritis    "knees" (03/28/2014)  . Chronic renal  insufficiency    stage II (mild)  . Depression   . Headache    with high blood sugar  . Hypercholesteremia   . Hypertension   . Numbness and tingling of right arm and leg   . Seasonal allergies   . Type II diabetes mellitus ([place])   . Umbilical hernia without mention of obstruction or gangrene   . Wears glasses     Past Surgical History:  Procedure Laterality Date  . INSERTION OF MESH N/A 03/28/2014   Procedure: INSERTION OF MESH;  Surgeon: Ralene Ok, MD;  Location: [place];  Service: General;  Laterality: N/A;  . INSERTION OF MESH N/A 05/08/2015   Procedure: INSERTION OF MESH;  Surgeon: Ralene Ok, MD;  Location: [place];  Service: General;  Laterality: N/A;  . LAPAROSCOPIC LYSIS OF ADHESIONS N/A 05/08/2015   Procedure: LAPAROSCOPIC LYSIS OF ADHESIONS;  Surgeon: Ralene Ok, MD;  Location: [place];  Service: General;  Laterality: N/A;  . MULTIPLE TOOTH EXTRACTIONS  ~ 2003  . TONSILLECTOMY    . [place]  . VENTRAL HERNIA REPAIR N/A 03/28/2014   Procedure: LAPAROSCOPIC VENTRAL HERNIA;  Surgeon: Ralene Ok, MD;  Location: [place];  Service: General;  Laterality: N/A;  . VENTRAL HERNIA REPAIR N/A 05/08/2015   Procedure: LAPAROSCOPIC VENTRAL HERNIA REPAIR WITH MESH;  Surgeon: Ralene Ok, MD;  Location: [place];  Service: General;  Laterality: N/A;    Family Psychiatric History: Viewed  Family History:  Family History  Problem Relation Age of Onset  . Diabetes Mother   .  High Cholesterol Mother   . Obesity Mother   . Diabetes Father   . Cancer Father        Prostate cancer  . Cancer Sister        Cervical cancer  . Diabetes Brother   . Bipolar disorder Brother   . Alcohol abuse Brother   . Drug abuse Brother   . Diabetes Sister   . Diabetes Brother     Social History:   Social History   Socioeconomic History  . Marital status: Married    Spouse name: [name]  . Number of children: 1  . Years of education: Not on file  .  Highest education level: Not on file  Occupational History  . Not on file  Social Needs  . Financial resource strain: Not on file  . Food insecurity    Worry: Not on file    Inability: Not on file  . Transportation needs    Medical: Not on file    Non-medical: Not on file  Tobacco Use  . Smoking status: Former Smoker    Packs/day: 0.25    Years: 26.00    Pack years: 6.50    Types: Cigarettes  . Smokeless tobacco: Never Used  Substance and Sexual Activity  . Alcohol use: Yes    Comment: "have a drink a few times/year"  . Drug use: No  . Sexual activity: Not Currently  Lifestyle  . Physical activity    Days per week: Not on file    Minutes per session: Not on file  . Stress: Not on file  Relationships  . Social Herbalist on phone: Not on file    Gets together: Not on file    Attends religious service: Not on file    Active member of club or organization: Not on file    Attends meetings of clubs or organizations: Not on file    Relationship status: Not on file  Other Topics Concern  . Not on file  Social History Narrative  . Not on file     Allergies:   Allergies  Allergen Reactions  . Eggs Or Egg-Derived Products Hives and Other (See Comments)    FLU VACCINE: caused hives    . Influenza Vaccines Hives    Vaccine from Egg derived product causes Hives  . Effexor [Venlafaxine]     "felt weird"  . Metformin And Related Swelling    ANGIOEDEMA    Metabolic Disorder Labs: Lab Results  Component Value Date   HGBA1C 10.8 (H) 06/14/2018   MPG 384 (H) 01/09/2015   MPG 160 (H) 03/28/2014   No results found for: PROLACTIN Lab Results  Component Value Date   CHOL 174 06/14/2018   TRIG 123 06/14/2018   HDL 46 06/14/2018   CHOLHDL 4 09/09/2017   VLDL 41.4 (H) 09/09/2017   LDLCALC 103 (H) 06/14/2018   LDLCALC 98 10/07/2017     Current Medications: Current Outpatient Medications  Medication Sig Dispense Refill  . Accu-Chek FastClix Lancets MISC  USE UP TO 3 TIMES DAILY    . ACCU-CHEK GUIDE test strip USE UP TO 3 TIMES DAILY    . azithromycin (ZITHROMAX) 250 MG tablet Take 2 tablets now and then one tablet for 4 days. DX: J32.9, J40 6 tablet 0  . benzonatate (TESSALON) 200 MG capsule Take 1 capsule (200 mg total) by mouth 2 (two) times daily as needed for cough. 20 capsule 0  . blood glucose meter kit  and supplies Dispense based on patient and insurance preference. Use up to three times daily as directed. (FOR ICD-10 E10.9, E11.9). 1 each 0  . buPROPion (WELLBUTRIN XL) 300 MG 24 hr tablet Take 1 tablet (300 mg total) by mouth daily. 90 tablet 1  . dapagliflozin propanediol (FARXIGA) 10 MG TABS tablet Take 10 mg by mouth daily. Must schedule appt for refills 90 tablet 0  . gabapentin (NEURONTIN) 300 MG capsule Take 1 capsule (300 mg total) by mouth 2 (two) times daily. 60 capsule 1  . Insulin Degludec (TRESIBA FLEXTOUCH) 200 UNIT/ML SOPN Inject 56 Units into the skin daily. 15 pen 3  . olmesartan-hydrochlorothiazide (BENICAR HCT) 20-12.5 MG tablet Take 1 tablet by mouth daily. 90 tablet 1  . predniSONE (DELTASONE) 50 MG tablet Take one tablet for daily for 5 days. 5 tablet 0  . sertraline (ZOLOFT) 100 MG tablet Take one and one/half tablet daily. 135 tablet 1  . simvastatin (ZOCOR) 10 MG tablet Take 1 tablet (10 mg total) by mouth daily. 90 tablet 4  . Topiramate ER (TROKENDI XR) 50 MG CP24 Take 1 tablet by mouth daily. 90 capsule 1  . TRULICITY 1.5 ZO/1.0RU SOPN INJECT 1.5 MG INTO THE SKIN ONCE A WEEK. 6 pen 1  . Vitamin D, Ergocalciferol, (DRISDOL) 1.25 MG (50000 UT) CAPS capsule Take 1 capsule (50,000 Units total) by mouth every 7 (seven) days. 12 capsule 0   No current facility-administered medications for this visit.       Psychiatric Specialty Exam: Review of Systems  Cardiovascular: Negative for chest pain.  Skin: Negative for rash.  Psychiatric/Behavioral: Positive for depression.    There were no vitals taken for this  visit.There is no height or weight on file to calculate BMI.  General Appearance:  Eye Contact:   Speech:  Slow  Volume:  Decreased  Mood: subdued  Affect congruent  Thought Process:  Goal Directed  Orientation:  Full (Time, Place, and Person)  Thought Content:  Rumination  Suicidal Thoughts:  No  Homicidal Thoughts:  No  Memory:  Immediate;   Fair Recent;   Fair Remote;   Fair  Judgement:  Good  Insight:  Good  Psychomotor Activity:  Concentration:  Concentration: Fair and Attention Span: Fair  Recall:  Good  Fund of Knowledge:Good  Language: Good  Akathisia:  No  Handed:  Right  AIMS (if indicated):  0  Assets:  Communication Skills Desire for Improvement Housing  ADL's:  Intact  Cognition: WNL  Sleep:  fair    Treatment Plan Summary: Major depressive disorder, recurrent severe: feels subdued, feels meds do what they can but it is due to her husband currnet conditon. 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  I discussed the assessment and treatment plan with the patient. The patient was provided an opportunity to ask questions and all were answered. The patient agreed with the plan and demonstrated an understanding of the instructions.   The patient was advised to call back or seek an in-person evaluation if the symptoms worsen or if the condition fails to improve as anticipated.  I provided 15 minutes of  non-face-to-face time during this encounter. Fu 3w   Merian Capron, MD 8/26/202010:36 AM

## 2019-04-26 ENCOUNTER — Other Ambulatory Visit: Payer: Self-pay

## 2019-04-26 ENCOUNTER — Encounter (HOSPITAL_COMMUNITY): Payer: Self-pay | Admitting: Psychiatry

## 2019-04-26 ENCOUNTER — Ambulatory Visit (INDEPENDENT_AMBULATORY_CARE_PROVIDER_SITE_OTHER): Payer: 59 | Admitting: Psychiatry

## 2019-04-26 DIAGNOSIS — F411 Generalized anxiety disorder: Secondary | ICD-10-CM

## 2019-04-26 DIAGNOSIS — F322 Major depressive disorder, single episode, severe without psychotic features: Secondary | ICD-10-CM

## 2019-04-26 DIAGNOSIS — F4310 Post-traumatic stress disorder, unspecified: Secondary | ICD-10-CM | POA: Diagnosis not present

## 2019-04-26 NOTE — Progress Notes (Signed)
[facility] tele follow up visit   Patient Identification: Jasmine [name] MRN:  [id number] Date of Evaluation:  04/26/2019 Referral Source: Her therapist Jasmine [name]     I connected with Jasmine [name] on 04/26/19 at  1:30 PM EDT by telephone and verified that I am speaking with the correct person using two identifiers.  I discussed the limitations, risks, security and privacy concerns of performing an evaluation and management service by telephone and the availability of in person appointments. I also discussed with the patient that there may be a patient responsible charge related to this service. The patient expressed understanding and agreed to proceed.     Chief Complaint:    depressed,    Visit Diagnosis:    ICD-10-CM   1. Severe major depression, single episode, without psychotic features (Jasmine [name])  F32.2   2. Generalized anxiety disorder  F41.1   3. PTSD (post-traumatic stress disorder)  F43.10     History of Present Illness: Jasmine [name] is 53 -year-old African-American married,   Husband passed around 10 days ago. Family visiting. She is preparing for cremation and going thru grief He was sick and in hospice Feels not able to go to work for now and office is aware  Tolerating meds. Says meds do help but cant add up work stress to current family stress   Works at [employer] and has gone thru some harrassment concerns in the past.   Aggravating factor:  Husband sickness and now grief.,job stress per history Modifying factor: few friends  Past Psychiatric History: Patient denies any history of psychiatric inpatient treatment, suicidal attempt, self abusive behavior, traumatic brain injury or any history of abuse.  Previous Psychotropic Medications: Yes   Substance Abuse History in the last 12 months:  No.  Consequences of Substance Abuse: Negative  Past Medical History:  Past Medical History:  Diagnosis Date  . Anxiety   . Arthritis    "knees" (03/28/2014)  . Chronic renal  insufficiency    stage II (mild)  . Depression   . Headache    with high blood sugar  . Hypercholesteremia   . Hypertension   . Numbness and tingling of right arm and leg   . Seasonal allergies   . Type II diabetes mellitus (Jasmine [name])   . Umbilical hernia without mention of obstruction or gangrene   . Wears glasses     Past Surgical History:  Procedure Laterality Date  . INSERTION OF MESH N/A 03/28/2014   Procedure: INSERTION OF MESH;  Surgeon: Ralene Ok, MD;  Location: [place];  Service: General;  Laterality: N/A;  . INSERTION OF MESH N/A 05/08/2015   Procedure: INSERTION OF MESH;  Surgeon: Ralene Ok, MD;  Location: [place];  Service: General;  Laterality: N/A;  . LAPAROSCOPIC LYSIS OF ADHESIONS N/A 05/08/2015   Procedure: LAPAROSCOPIC LYSIS OF ADHESIONS;  Surgeon: Ralene Ok, MD;  Location: [place];  Service: General;  Laterality: N/A;  . MULTIPLE TOOTH EXTRACTIONS  ~ 2003  . TONSILLECTOMY    . Jasmine [name]  . VENTRAL HERNIA REPAIR N/A 03/28/2014   Procedure: LAPAROSCOPIC VENTRAL HERNIA;  Surgeon: Ralene Ok, MD;  Location: [name];  Service: General;  Laterality: N/A;  . VENTRAL HERNIA REPAIR N/A 05/08/2015   Procedure: LAPAROSCOPIC VENTRAL HERNIA REPAIR WITH MESH;  Surgeon: Ralene Ok, MD;  Location: [name];  Service: General;  Laterality: N/A;    Family Psychiatric History: Viewed  Family History:  Family History  Problem Relation Age of Onset  .  Diabetes Mother   . High Cholesterol Mother   . Obesity Mother   . Diabetes Father   . Cancer Father        Prostate cancer  . Cancer Sister        Cervical cancer  . Diabetes Brother   . Bipolar disorder Brother   . Alcohol abuse Brother   . Drug abuse Brother   . Diabetes Sister   . Diabetes Brother     Social History:   Social History   Socioeconomic History  . Marital status: Married    Spouse name: Jasmine [name]  . Number of children: 1  . Years of education: Not on file  .  Highest education level: Not on file  Occupational History  . Not on file  Social Needs  . Financial resource strain: Not on file  . Food insecurity    Worry: Not on file    Inability: Not on file  . Transportation needs    Medical: Not on file    Non-medical: Not on file  Tobacco Use  . Smoking status: Former Smoker    Packs/day: 0.25    Years: 26.00    Pack years: 6.50    Types: Cigarettes  . Smokeless tobacco: Never Used  Substance and Sexual Activity  . Alcohol use: Yes    Comment: "have a drink a few times/year"  . Drug use: No  . Sexual activity: Not Currently  Lifestyle  . Physical activity    Days per week: Not on file    Minutes per session: Not on file  . Stress: Not on file  Relationships  . Social Herbalist on phone: Not on file    Gets together: Not on file    Attends religious service: Not on file    Active member of club or organization: Not on file    Attends meetings of clubs or organizations: Not on file    Relationship status: Not on file  Other Topics Concern  . Not on file  Social History Narrative  . Not on file     Allergies:   Allergies  Allergen Reactions  . Eggs Or Egg-Derived Products Hives and Other (See Comments)    FLU VACCINE: caused hives    . Influenza Vaccines Hives    Vaccine from Egg derived product causes Hives  . Effexor [Venlafaxine]     "felt weird"  . Metformin And Related Swelling    ANGIOEDEMA    Metabolic Disorder Labs: Lab Results  Component Value Date   HGBA1C 10.8 (H) 06/14/2018   MPG 384 (H) 01/09/2015   MPG 160 (H) 03/28/2014   No results found for: PROLACTIN Lab Results  Component Value Date   CHOL 174 06/14/2018   TRIG 123 06/14/2018   HDL 46 06/14/2018   CHOLHDL 4 09/09/2017   VLDL 41.4 (H) 09/09/2017   LDLCALC 103 (H) 06/14/2018   LDLCALC 98 10/07/2017     Current Medications: Current Outpatient Medications  Medication Sig Dispense Refill  . Accu-Chek FastClix Lancets MISC  USE UP TO 3 TIMES DAILY    . ACCU-CHEK GUIDE test strip USE UP TO 3 TIMES DAILY    . azithromycin (ZITHROMAX) 250 MG tablet Take 2 tablets now and then one tablet for 4 days. DX: J32.9, J40 6 tablet 0  . benzonatate (TESSALON) 200 MG capsule Take 1 capsule (200 mg total) by mouth 2 (two) times daily as needed for cough. 20 capsule 0  .  blood glucose meter kit and supplies Dispense based on patient and insurance preference. Use up to three times daily as directed. (FOR ICD-10 E10.9, E11.9). 1 each 0  . buPROPion (WELLBUTRIN XL) 300 MG 24 hr tablet Take 1 tablet (300 mg total) by mouth daily. 90 tablet 1  . dapagliflozin propanediol (FARXIGA) 10 MG TABS tablet Take 10 mg by mouth daily. Must schedule appt for refills 90 tablet 0  . gabapentin (NEURONTIN) 300 MG capsule Take 1 capsule (300 mg total) by mouth 2 (two) times daily. 60 capsule 1  . Insulin Degludec (TRESIBA FLEXTOUCH) 200 UNIT/ML SOPN Inject 56 Units into the skin daily. 15 pen 3  . olmesartan-hydrochlorothiazide (BENICAR HCT) 20-12.5 MG tablet Take 1 tablet by mouth daily. 90 tablet 1  . predniSONE (DELTASONE) 50 MG tablet Take one tablet for daily for 5 days. 5 tablet 0  . sertraline (ZOLOFT) 100 MG tablet Take one and one/half tablet daily. 135 tablet 1  . simvastatin (ZOCOR) 10 MG tablet Take 1 tablet (10 mg total) by mouth daily. 90 tablet 4  . Topiramate ER (TROKENDI XR) 50 MG CP24 Take 1 tablet by mouth daily. 90 capsule 1  . TRULICITY 1.5 EO/7.1QR SOPN INJECT 1.5 MG INTO THE SKIN ONCE A WEEK. 6 pen 1  . Vitamin D, Ergocalciferol, (DRISDOL) 1.25 MG (50000 UT) CAPS capsule Take 1 capsule (50,000 Units total) by mouth every 7 (seven) days. 12 capsule 0   No current facility-administered medications for this visit.       Psychiatric Specialty Exam: Review of Systems  Cardiovascular: Negative for chest pain.  Skin: Negative for rash.  Psychiatric/Behavioral: Positive for depression.    There were no vitals taken for this  visit.There is no height or weight on file to calculate BMI.  General Appearance:  Eye Contact:   Speech:  Slow  Volume:  Decreased  Mood: subdued  Affect congruent  Thought Process:  Goal Directed  Orientation:  Full (Time, Place, and Person)  Thought Content:  Rumination  Suicidal Thoughts:  No  Homicidal Thoughts:  No  Memory:  Immediate;   Fair Recent;   Fair Remote;   Fair  Judgement:  Good  Insight:  Good  Psychomotor Activity:  Concentration:  Concentration: Fair and Attention Span: Fair  Recall:  Good  Fund of Knowledge:Good  Language: Good  Akathisia:  No  Handed:  Right  AIMS (if indicated):  0  Assets:  Communication Skills Desire for Improvement Housing  ADL's:  Intact  Cognition: WNL  Sleep:  fair    Treatment Plan Summary: Major depressive disorder, recurrent severe: subdued and in grief, has family support, says need more time to recover feels meds need not be adjusted  Generalized anxiety : fluctuates, continue zoloft Panic attacks : sporadic, continue zoloft and consider therapy  Not suicidal and has support from family   Will need extention of days off considering her current situation.  I discussed the assessment and treatment plan with the patient. The patient was provided an opportunity to ask questions and all were answered. The patient agreed with the plan and demonstrated an understanding of the instructions.   The patient was advised to call back or seek an in-person evaluation if the symptoms worsen or if the condition fails to improve as anticipated.  I provided 15 minutes of non-face-to-face time during this encounter. FX5-8 w   Merian Capron, MD 9/16/20201:37 PM

## 2019-04-30 ENCOUNTER — Other Ambulatory Visit: Payer: Self-pay | Admitting: Physician Assistant

## 2019-04-30 DIAGNOSIS — I10 Essential (primary) hypertension: Secondary | ICD-10-CM

## 2019-05-05 ENCOUNTER — Ambulatory Visit (HOSPITAL_COMMUNITY): Payer: 59 | Admitting: Psychiatry

## 2019-05-10 ENCOUNTER — Other Ambulatory Visit: Payer: Self-pay

## 2019-05-10 ENCOUNTER — Encounter (HOSPITAL_COMMUNITY): Payer: Self-pay | Admitting: Psychiatry

## 2019-05-10 ENCOUNTER — Ambulatory Visit (INDEPENDENT_AMBULATORY_CARE_PROVIDER_SITE_OTHER): Payer: 59 | Admitting: Psychiatry

## 2019-05-10 DIAGNOSIS — F411 Generalized anxiety disorder: Secondary | ICD-10-CM | POA: Diagnosis not present

## 2019-05-10 DIAGNOSIS — F4310 Post-traumatic stress disorder, unspecified: Secondary | ICD-10-CM

## 2019-05-10 DIAGNOSIS — F322 Major depressive disorder, single episode, severe without psychotic features: Secondary | ICD-10-CM

## 2019-05-10 NOTE — Progress Notes (Signed)
[facility] tele follow up visit   Patient Identification: Jasmine [name] MRN:  [id number] Date of Evaluation:  05/10/2019 Referral Source: Her therapist [facility]     I connected with Jasmine [name] on 05/10/19 at  9:00 AM EDT by telephone and verified that I am speaking with the correct person using two identifiers.   I discussed the limitations, risks, security and privacy concerns of performing an evaluation and management service by telephone and the availability of in person appointments. I also discussed with the patient that there may be a patient responsible charge related to this service. The patient expressed understanding and agreed to proceed.     Chief Complaint:    depressed,    Visit Diagnosis:    ICD-10-CM   1. Severe major depression, single episode, without psychotic features (Jasmine [name])  F32.2   2. Generalized anxiety disorder  F41.1   3. PTSD (post-traumatic stress disorder)  F43.10   4. Anxiety state  F41.1     History of Present Illness: [name] is 53 -year-old African-American married,   Husband passed around 3 weeks ago, still in grief and have some services this Saturday with family member  He was sick and in hospice Feels not able to go to work but we encouraged to go back for half days soon to help distract and work on moving forward Also has therapy appointment next week Tolerating meds. Says meds do help but still in grief and having crying spells Works at [employer] and has gone thru some harrassment concerns in the past.   Aggravating factor:  Husband sickness and now grief.,job stress per history Modifying factor: few friends  Past Psychiatric History: Patient denies any history of psychiatric inpatient treatment, suicidal attempt, self abusive behavior, traumatic brain injury or any history of abuse.  Previous Psychotropic Medications: Yes   Substance Abuse History in the last 12 months:  No.  Consequences of Substance Abuse: Negative  Past  Medical History:  Past Medical History:  Diagnosis Date  . Anxiety   . Arthritis    "knees" (03/28/2014)  . Chronic renal insufficiency    stage II (mild)  . Depression   . Headache    with high blood sugar  . Hypercholesteremia   . Hypertension   . Numbness and tingling of right arm and leg   . Seasonal allergies   . Type II diabetes mellitus (Jasmine [name])   . Umbilical hernia without mention of obstruction or gangrene   . Wears glasses     Past Surgical History:  Procedure Laterality Date  . INSERTION OF MESH N/A 03/28/2014   Procedure: INSERTION OF MESH;  Surgeon: Jasmine Ok, MD;  Location: [place];  Service: General;  Laterality: N/A;  . INSERTION OF MESH N/A 05/08/2015   Procedure: INSERTION OF MESH;  Surgeon: Jasmine Ok, MD;  Location: [place];  Service: General;  Laterality: N/A;  . LAPAROSCOPIC LYSIS OF ADHESIONS N/A 05/08/2015   Procedure: LAPAROSCOPIC LYSIS OF ADHESIONS;  Surgeon: Jasmine Ok, MD;  Location: [place];  Service: General;  Laterality: N/A;  . MULTIPLE TOOTH EXTRACTIONS  ~ 2003  . TONSILLECTOMY    . [place]  . VENTRAL HERNIA REPAIR N/A 03/28/2014   Procedure: LAPAROSCOPIC VENTRAL HERNIA;  Surgeon: Jasmine Ok, MD;  Location: [place];  Service: General;  Laterality: N/A;  . VENTRAL HERNIA REPAIR N/A 05/08/2015   Procedure: LAPAROSCOPIC VENTRAL HERNIA REPAIR WITH MESH;  Surgeon: Jasmine Ok, MD;  Location: [place];  Service:  General;  Laterality: N/A;    Family Psychiatric History: Viewed  Family History:  Family History  Problem Relation Age of Onset  . Diabetes Mother   . High Cholesterol Mother   . Obesity Mother   . Diabetes Father   . Cancer Father        Prostate cancer  . Cancer Sister        Cervical cancer  . Diabetes Brother   . Bipolar disorder Brother   . Alcohol abuse Brother   . Drug abuse Brother   . Diabetes Sister   . Diabetes Brother     Social History:   Social History   Socioeconomic History  .  Marital status: Married    Spouse name: Jasmine [name]  . Number of children: 1  . Years of education: Not on file  . Highest education level: Not on file  Occupational History  . Not on file  Social Needs  . Financial resource strain: Not on file  . Food insecurity    Worry: Not on file    Inability: Not on file  . Transportation needs    Medical: Not on file    Non-medical: Not on file  Tobacco Use  . Smoking status: Former Smoker    Packs/day: 0.25    Years: 26.00    Pack years: 6.50    Types: Cigarettes  . Smokeless tobacco: Never Used  Substance and Sexual Activity  . Alcohol use: Yes    Comment: "have a drink a few times/year"  . Drug use: No  . Sexual activity: Not Currently  Lifestyle  . Physical activity    Days per week: Not on file    Minutes per session: Not on file  . Stress: Not on file  Relationships  . Social Herbalist on phone: Not on file    Gets together: Not on file    Attends religious service: Not on file    Active member of club or organization: Not on file    Attends meetings of clubs or organizations: Not on file    Relationship status: Not on file  Other Topics Concern  . Not on file  Social History Narrative  . Not on file     Allergies:   Allergies  Allergen Reactions  . Eggs Or Egg-Derived Products Hives and Other (See Comments)    FLU VACCINE: caused hives    . Influenza Vaccines Hives    Vaccine from Egg derived product causes Hives  . Effexor [Venlafaxine]     "felt weird"  . Metformin And Related Swelling    ANGIOEDEMA    Metabolic Disorder Labs: Lab Results  Component Value Date   HGBA1C 10.8 (H) 06/14/2018   MPG 384 (H) 01/09/2015   MPG 160 (H) 03/28/2014   No results found for: PROLACTIN Lab Results  Component Value Date   CHOL 174 06/14/2018   TRIG 123 06/14/2018   HDL 46 06/14/2018   CHOLHDL 4 09/09/2017   VLDL 41.4 (H) 09/09/2017   LDLCALC 103 (H) 06/14/2018   LDLCALC 98 10/07/2017      Current Medications: Current Outpatient Medications  Medication Sig Dispense Refill  . Accu-Chek FastClix Lancets MISC USE UP TO 3 TIMES DAILY    . ACCU-CHEK GUIDE test strip USE UP TO 3 TIMES DAILY    . azithromycin (ZITHROMAX) 250 MG tablet Take 2 tablets now and then one tablet for 4 days. DX: J32.9, J40 6 tablet 0  .  benzonatate (TESSALON) 200 MG capsule Take 1 capsule (200 mg total) by mouth 2 (two) times daily as needed for cough. 20 capsule 0  . blood glucose meter kit and supplies Dispense based on patient and insurance preference. Use up to three times daily as directed. (FOR ICD-10 E10.9, E11.9). 1 each 0  . buPROPion (WELLBUTRIN XL) 300 MG 24 hr tablet Take 1 tablet (300 mg total) by mouth daily. 90 tablet 1  . dapagliflozin propanediol (FARXIGA) 10 MG TABS tablet Take 10 mg by mouth daily. Must schedule appt for refills 90 tablet 0  . gabapentin (NEURONTIN) 300 MG capsule Take 1 capsule (300 mg total) by mouth 2 (two) times daily. 60 capsule 1  . Insulin Degludec (TRESIBA FLEXTOUCH) 200 UNIT/ML SOPN Inject 56 Units into the skin daily. 15 pen 3  . olmesartan-hydrochlorothiazide (BENICAR HCT) 20-12.5 MG tablet Take 1 tablet by mouth daily. 90 tablet 1  . predniSONE (DELTASONE) 50 MG tablet Take one tablet for daily for 5 days. 5 tablet 0  . sertraline (ZOLOFT) 100 MG tablet Take one and one/half tablet daily. 135 tablet 1  . simvastatin (ZOCOR) 10 MG tablet Take 1 tablet (10 mg total) by mouth daily. 90 tablet 4  . Topiramate ER (TROKENDI XR) 50 MG CP24 Take 1 tablet by mouth daily. 90 capsule 1  . TRULICITY 1.5 EZ/6.6QH SOPN INJECT 1.5 MG INTO THE SKIN ONCE A WEEK. 6 pen 1  . Vitamin D, Ergocalciferol, (DRISDOL) 1.25 MG (50000 UT) CAPS capsule Take 1 capsule (50,000 Units total) by mouth every 7 (seven) days. 12 capsule 0   No current facility-administered medications for this visit.       Psychiatric Specialty Exam: Review of Systems  Cardiovascular: Negative for chest  pain.  Skin: Negative for rash.  Psychiatric/Behavioral: Positive for depression.    There were no vitals taken for this visit.There is no height or weight on file to calculate BMI.  General Appearance:  Eye Contact:   Speech:  Slow  Volume:  Decreased  Mood: subdued  Affect congruent  Thought Process:  Goal Directed  Orientation:  Full (Time, Place, and Person)  Thought Content:  Rumination  Suicidal Thoughts:  No  Homicidal Thoughts:  No  Memory:  Immediate;   Fair Recent;   Fair Remote;   Fair  Judgement:  Good  Insight:  Good  Psychomotor Activity:  Concentration:  Concentration: Fair and Attention Span: Fair  Recall:  Good  Fund of Knowledge:Good  Language: Good  Akathisia:  No  Handed:  Right  AIMS (if indicated):  0  Assets:  Communication Skills Desire for Improvement Housing  ADL's:  Intact  Cognition: WNL  Sleep:  fair    Treatment Plan Summary: Major depressive disorder, recurrent severe: subdued and in grief.  has family support, says need more time to recover feels meds need not be adjusted. Has services with family this Saturday, encouraged to go out and for distraction Will keep her off work for few more days and possible join back in a week. Part time or half time starting October 7th 2020. She doesn't feel can handle full time work Call back for any refill needed  Generalized anxiety : fluctuates, continue zoloft Panic attacks : sporadic, continue zoloft and therapy Provided supportive therapy.   Not suicidal and has support from family   Will need extention of days off for another week and then join part time  I discussed the assessment and treatment plan with the patient. The  patient was provided an opportunity to ask questions and all were answered. The patient agreed with the plan and demonstrated an understanding of the instructions.   The patient was advised to call back or seek an in-person evaluation if the symptoms worsen or if the  condition fails to improve as anticipated.  I provided 15 minutes of non-face-to-face time during this encounter. Fu 3-4 weeks   Merian Capron, MD 9/30/20209:08 AM

## 2019-05-19 ENCOUNTER — Other Ambulatory Visit: Payer: Self-pay | Admitting: Physician Assistant

## 2019-05-19 DIAGNOSIS — E1169 Type 2 diabetes mellitus with other specified complication: Secondary | ICD-10-CM

## 2019-05-19 DIAGNOSIS — E785 Hyperlipidemia, unspecified: Secondary | ICD-10-CM

## 2019-05-21 ENCOUNTER — Other Ambulatory Visit: Payer: Self-pay | Admitting: Physician Assistant

## 2019-05-21 DIAGNOSIS — E1122 Type 2 diabetes mellitus with diabetic chronic kidney disease: Secondary | ICD-10-CM

## 2019-05-21 DIAGNOSIS — Z794 Long term (current) use of insulin: Secondary | ICD-10-CM

## 2019-06-12 ENCOUNTER — Telehealth (HOSPITAL_COMMUNITY): Payer: Self-pay | Admitting: Psychiatry

## 2019-06-12 NOTE — Telephone Encounter (Signed)
Pt is at work now,  She states she can not handle it.  She request Dr. De Nurse to write her out of work for 1/2 days.  She did send paperwork for De Nurse to fill out.   Please advise.

## 2019-06-13 NOTE — Telephone Encounter (Signed)
Forms were completed and faxed. Nothing Further Needed at this time.

## 2019-06-13 NOTE — Telephone Encounter (Signed)
Will work on it today. Once you get the form make appointment before December 1 and write date of next appointment

## 2019-07-05 ENCOUNTER — Encounter (HOSPITAL_COMMUNITY): Payer: Self-pay | Admitting: Psychiatry

## 2019-07-05 ENCOUNTER — Ambulatory Visit (INDEPENDENT_AMBULATORY_CARE_PROVIDER_SITE_OTHER): Payer: 59 | Admitting: Psychiatry

## 2019-07-05 DIAGNOSIS — F4310 Post-traumatic stress disorder, unspecified: Secondary | ICD-10-CM

## 2019-07-05 DIAGNOSIS — F330 Major depressive disorder, recurrent, mild: Secondary | ICD-10-CM

## 2019-07-05 DIAGNOSIS — F322 Major depressive disorder, single episode, severe without psychotic features: Secondary | ICD-10-CM | POA: Diagnosis not present

## 2019-07-05 DIAGNOSIS — F411 Generalized anxiety disorder: Secondary | ICD-10-CM | POA: Diagnosis not present

## 2019-07-05 MED ORDER — BUPROPION HCL ER (XL) 300 MG PO TB24: 300.0000 mg | ORAL_TABLET | Freq: Every day | ORAL | 1 refills | Status: DC

## 2019-07-05 NOTE — Progress Notes (Signed)
[facility] tele follow up visit   Patient Identification: Jasmine [name] MRN:  [id number] Date of Evaluation:  07/05/2019 Referral Source: Her therapist [facility] Records     I connected with [name] on 07/05/19 at 10:45 AM EST by telephone and verified that I am speaking with the correct person using two identifiers.      I discussed the limitations, risks, security and privacy concerns of performing an evaluation and management service by telephone and the availability of in person appointments. I also discussed with the patient that there may be a patient responsible charge related to this service. The patient expressed understanding and agreed to proceed.     Chief Complaint:    depressed,    Visit Diagnosis:    ICD-10-CM   1. Severe major depression, single episode, without psychotic features ([place])  F32.2   2. Generalized anxiety disorder  F41.1   3. PTSD (post-traumatic stress disorder)  F43.10   4. Anxiety state  F41.1   5. Mild episode of recurrent major depressive disorder (HCC)  F33.0 buPROPion (WELLBUTRIN XL) 300 MG 24 hr tablet    History of Present Illness: Jasmine [name] is 53 -year-old African-American married,   Husband passed 2 months ago. Handling grief some better, been off work  Discussed possibility joining work dec 1,  Was not able to handle back work last month and called as she got decompensated. Also has therapy appointment next week Tolerating meds. Says meds do help gets dysphoric at times Has daughters support Works at [employer] and has gone thru some harrassment concerns in the past.   Aggravating factor:  Grief, harrassment  Modifying factor: few friends, daughter  Past Psychiatric History: Patient denies any history of psychiatric inpatient treatment, suicidal attempt, self abusive behavior, traumatic brain injury or any history of abuse.  Previous Psychotropic Medications: Yes   Substance Abuse History in the last 12 months:  No.  Consequences of  Substance Abuse: Negative  Past Medical History:  Past Medical History:  Diagnosis Date  . Anxiety   . Arthritis    "knees" (03/28/2014)  . Chronic renal insufficiency    stage II (mild)  . Depression   . Headache    with high blood sugar  . Hypercholesteremia   . Hypertension   . Numbness and tingling of right arm and leg   . Seasonal allergies   . Type II diabetes mellitus ([place])   . Umbilical hernia without mention of obstruction or gangrene   . Wears glasses     Past Surgical History:  Procedure Laterality Date  . INSERTION OF MESH N/A 03/28/2014   Procedure: INSERTION OF MESH;  Surgeon: Ralene Ok, MD;  Location: [place];  Service: General;  Laterality: N/A;  . INSERTION OF MESH N/A 05/08/2015   Procedure: INSERTION OF MESH;  Surgeon: Ralene Ok, MD;  Location: [place];  Service: General;  Laterality: N/A;  . LAPAROSCOPIC LYSIS OF ADHESIONS N/A 05/08/2015   Procedure: LAPAROSCOPIC LYSIS OF ADHESIONS;  Surgeon: Ralene Ok, MD;  Location: [place];  Service: General;  Laterality: N/A;  . MULTIPLE TOOTH EXTRACTIONS  ~ 2003  . TONSILLECTOMY    . [place]  . VENTRAL HERNIA REPAIR N/A 03/28/2014   Procedure: LAPAROSCOPIC VENTRAL HERNIA;  Surgeon: Ralene Ok, MD;  Location: [place];  Service: General;  Laterality: N/A;  . VENTRAL HERNIA REPAIR N/A 05/08/2015   Procedure: LAPAROSCOPIC VENTRAL HERNIA REPAIR WITH MESH;  Surgeon: Ralene Ok, MD;  Location: [place];  Service: General;  Laterality: N/A;    Family Psychiatric History: Viewed  Family History:  Family History  Problem Relation Age of Onset  . Diabetes Mother   . High Cholesterol Mother   . Obesity Mother   . Diabetes Father   . Cancer Father        Prostate cancer  . Cancer Sister        Cervical cancer  . Diabetes Brother   . Bipolar disorder Brother   . Alcohol abuse Brother   . Drug abuse Brother   . Diabetes Sister   . Diabetes Brother     Social History:   Social  History   Socioeconomic History  . Marital status: Married    Spouse name: Jasmine [name]  . Number of children: 1  . Years of education: Not on file  . Highest education level: Not on file  Occupational History  . Not on file  Social Needs  . Financial resource strain: Not on file  . Food insecurity    Worry: Not on file    Inability: Not on file  . Transportation needs    Medical: Not on file    Non-medical: Not on file  Tobacco Use  . Smoking status: Former Smoker    Packs/day: 0.25    Years: 26.00    Pack years: 6.50    Types: Cigarettes  . Smokeless tobacco: Never Used  Substance and Sexual Activity  . Alcohol use: Yes    Comment: "have a drink a few times/year"  . Drug use: No  . Sexual activity: Not Currently  Lifestyle  . Physical activity    Days per week: Not on file    Minutes per session: Not on file  . Stress: Not on file  Relationships  . Social Herbalist on phone: Not on file    Gets together: Not on file    Attends religious service: Not on file    Active member of club or organization: Not on file    Attends meetings of clubs or organizations: Not on file    Relationship status: Not on file  Other Topics Concern  . Not on file  Social History Narrative  . Not on file     Allergies:   Allergies  Allergen Reactions  . Eggs Or Egg-Derived Products Hives and Other (See Comments)    FLU VACCINE: caused hives    . Influenza Vaccines Hives    Vaccine from Egg derived product causes Hives  . Effexor [Venlafaxine]     "felt weird"  . Metformin And Related Swelling    ANGIOEDEMA    Metabolic Disorder Labs: Lab Results  Component Value Date   HGBA1C 10.8 (H) 06/14/2018   MPG 384 (H) 01/09/2015   MPG 160 (H) 03/28/2014   No results found for: PROLACTIN Lab Results  Component Value Date   CHOL 174 06/14/2018   TRIG 123 06/14/2018   HDL 46 06/14/2018   CHOLHDL 4 09/09/2017   VLDL 41.4 (H) 09/09/2017   LDLCALC 103 (H)  06/14/2018   LDLCALC 98 10/07/2017     Current Medications: Current Outpatient Medications  Medication Sig Dispense Refill  . Accu-Chek FastClix Lancets MISC USE UP TO 3 TIMES DAILY    . ACCU-CHEK GUIDE test strip USE UP TO 3 TIMES DAILY    . azithromycin (ZITHROMAX) 250 MG tablet Take 2 tablets now and then one tablet for 4 days. DX: J32.9, J40 6 tablet 0  .  benzonatate (TESSALON) 200 MG capsule Take 1 capsule (200 mg total) by mouth 2 (two) times daily as needed for cough. 20 capsule 0  . blood glucose meter kit and supplies Dispense based on patient and insurance preference. Use up to three times daily as directed. (FOR ICD-10 E10.9, E11.9). 1 each 0  . buPROPion (WELLBUTRIN XL) 300 MG 24 hr tablet Take 1 tablet (300 mg total) by mouth daily. 90 tablet 1  . dapagliflozin propanediol (FARXIGA) 10 MG TABS tablet Take 10 mg by mouth daily. NEEDS APPT 30 tablet 0  . gabapentin (NEURONTIN) 300 MG capsule Take 1 capsule (300 mg total) by mouth 2 (two) times daily. 60 capsule 1  . Insulin Degludec (TRESIBA FLEXTOUCH) 200 UNIT/ML SOPN Inject 56 Units into the skin daily. 15 pen 3  . olmesartan-hydrochlorothiazide (BENICAR HCT) 20-12.5 MG tablet Take 1 tablet by mouth daily. 90 tablet 1  . predniSONE (DELTASONE) 50 MG tablet Take one tablet for daily for 5 days. 5 tablet 0  . sertraline (ZOLOFT) 100 MG tablet Take one and one/half tablet daily. 135 tablet 1  . simvastatin (ZOCOR) 10 MG tablet Take 1 tablet (10 mg total) by mouth daily. NEED LABS 90 tablet 0  . Topiramate ER (TROKENDI XR) 50 MG CP24 Take 1 tablet by mouth daily. 90 capsule 1  . TRULICITY 1.5 YD/7.4JO SOPN INJECT 1.5 MG INTO THE SKIN ONCE A WEEK. 6 pen 1  . Vitamin D, Ergocalciferol, (DRISDOL) 1.25 MG (50000 UT) CAPS capsule Take 1 capsule (50,000 Units total) by mouth every 7 (seven) days. 12 capsule 0   No current facility-administered medications for this visit.       Psychiatric Specialty Exam: Review of Systems   Cardiovascular: Negative for chest pain.  Skin: Negative for rash.    There were no vitals taken for this visit.There is no height or weight on file to calculate BMI.  General Appearance:  Eye Contact:   Speech:  Slow  Volume:  Decreased  Mood: some better  Affect congruent  Thought Process:  Goal Directed  Orientation:  Full (Time, Place, and Person)  Thought Content:  Rumination  Suicidal Thoughts:  No  Homicidal Thoughts:  No  Memory:  Immediate;   Fair Recent;   Fair Remote;   Fair  Judgement:  Good  Insight:  Good  Psychomotor Activity:  Concentration:  Concentration: Fair and Attention Span: Fair  Recall:  Good  Fund of Knowledge:Good  Language: Good  Akathisia:  No  Handed:  Right  AIMS (if indicated):  0  Assets:  Communication Skills Desire for Improvement Housing  ADL's:  Intact  Cognition: WNL  Sleep:  fair    Treatment Plan Summary: Major depressive disorder, recurrent severe: subdued but some better, discussed to join back work dec 1. FT and see how it goes Continue wellbutrin  Generalized anxiety : fluctuates, continue zoloft Panic attacks : sporadic, continue zoloft and therapy Provided supportive therapy.   Not suicidal and has support from family    I discussed the assessment and treatment plan with the patient. The patient was provided an opportunity to ask questions and all were answered. The patient agreed with the plan and demonstrated an understanding of the instructions.   The patient was advised to call back or seek an in-person evaluation if the symptoms worsen or if the condition fails to improve as anticipated.  I provided 15 minutes of non-face-to-face time during this encounter. Fu 3-4 weeks   Merian Capron, MD 11/25/202010:55  AM

## 2019-07-10 LAB — HM DIABETES EYE EXAM

## 2019-07-25 ENCOUNTER — Other Ambulatory Visit: Payer: Self-pay | Admitting: Internal Medicine

## 2019-07-25 DIAGNOSIS — E1122 Type 2 diabetes mellitus with diabetic chronic kidney disease: Secondary | ICD-10-CM

## 2019-08-08 ENCOUNTER — Ambulatory Visit (INDEPENDENT_AMBULATORY_CARE_PROVIDER_SITE_OTHER): Payer: 59 | Admitting: Physician Assistant

## 2019-08-08 ENCOUNTER — Other Ambulatory Visit: Payer: Self-pay

## 2019-08-08 ENCOUNTER — Encounter: Payer: Self-pay | Admitting: Physician Assistant

## 2019-08-08 VITALS — BP 134/87 | HR 100 | Ht 62.0 in | Wt 247.0 lb

## 2019-08-08 DIAGNOSIS — Z794 Long term (current) use of insulin: Secondary | ICD-10-CM | POA: Diagnosis not present

## 2019-08-08 DIAGNOSIS — R202 Paresthesia of skin: Secondary | ICD-10-CM

## 2019-08-08 DIAGNOSIS — H539 Unspecified visual disturbance: Secondary | ICD-10-CM

## 2019-08-08 DIAGNOSIS — E1165 Type 2 diabetes mellitus with hyperglycemia: Secondary | ICD-10-CM

## 2019-08-08 DIAGNOSIS — R200 Anesthesia of skin: Secondary | ICD-10-CM

## 2019-08-08 LAB — POCT GLYCOSYLATED HEMOGLOBIN (HGB A1C): Hemoglobin A1C: 7.7 % — AB (ref 4.0–5.6)

## 2019-08-08 MED ORDER — PREDNISONE 50 MG PO TABS: ORAL_TABLET | ORAL | 0 refills | Status: DC

## 2019-08-08 MED ORDER — CYCLOBENZAPRINE HCL 10 MG PO TABS: 10.0000 mg | ORAL_TABLET | Freq: Three times a day (TID) | ORAL | 0 refills | Status: DC | PRN

## 2019-08-08 NOTE — Progress Notes (Signed)
Subjective:    Patient ID: Jasmine [name], female    DOB: 1965/11/06, 53 y.o.   MRN: [id number]  HPI  Pt is a 53 yo obese female with T2DM with diabetic retinopathy, HTN, HLD who presents to the clinic with vision changes.   Pt sees endocrinology for DM but has not seen in a while. Her sugars are ranging from 116 lowest and 218 highest. She had eye exam done in last few months at [facility] and was told "everything looked good". For the last week she has noticed she has had to squint more and things have been blurring in both eyes. She denies any recent prednisone use. She is taking her DM medication. She has not started any new medication.   She has also noted her right arm discomfort with some numbness and tingling radiating down to into finger tips. At times just right elbow down to right hand. Seems to go into mostly middle finger. Denies any injury. Symptoms are not constant. Denies any neck pain.   .. Active Ambulatory Problems    Diagnosis Date Noted  . Depression 02/09/2010  . ALLERGIC RHINITIS 02/09/2010  . Morbid obesity with BMI of 50.0-59.9, adult (HCC) 06/03/2012  . Hyperlipidemia 11/27/2013  . Primary osteoarthritis of both knees 11/27/2013  . Chronic renal insufficiency, stage II (mild) 11/28/2013  . Umbilical hernia without obstruction and without gangrene 02/02/2014  . Ventral hernia, recurrent 03/28/2014  . Essential hypertension 01/09/2015  . Diabetic retinopathy (HCC) 03/12/2016  . Hyperlipidemia associated with type 2 diabetes mellitus (HCC) 06/17/2016  . Anxiety state 06/17/2016  . Tobacco use 08/13/2016  . Micturition syncope 08/13/2016  . Vitamin D deficiency 11/27/2016  . B12 deficiency 11/27/2016  . Frequent headaches 01/05/2017  . Menopause 05/13/2017  . Severe episode of recurrent major depressive disorder, without psychotic features (HCC) 05/19/2017  . Generalized anxiety disorder 05/19/2017  . Severe major depression, single episode,  without psychotic features (HCC) 05/26/2017  . Type 2 diabetes mellitus with chronic kidney disease, with long-term current use of insulin (HCC) 07/12/2017  . Acute stress reaction 03/09/2018  . Class 3 severe obesity due to excess calories with serious comorbidity and body mass index (BMI) of 45.0 to 49.9 in adult (HCC) 11/21/2018  . Vision changes 08/21/2019  . Numbness and tingling of right arm 08/21/2019   Resolved Ambulatory Problems    Diagnosis Date Noted  . No Resolved Ambulatory Problems   Past Medical History:  Diagnosis Date  . Anxiety   . Arthritis   . Chronic renal insufficiency   . Headache   . Hypercholesteremia   . Hypertension   . Numbness and tingling of right arm and leg   . Seasonal allergies   . Type II diabetes mellitus (HCC)   . Umbilical hernia without mention of obstruction or gangrene   . Wears glasses       Review of Systems See HPI.     Objective:   Physical Exam Vitals reviewed.  Constitutional:      Appearance: Normal appearance. She is obese.  HENT:     Head: Normocephalic.     Mouth/Throat:     Mouth: Mucous membranes are moist.  Eyes:     Extraocular Movements: Extraocular movements intact.     Pupils: Pupils are equal, round, and reactive to light.     Comments: Bilateral erythematous conjunctiva.  Cardiovascular:     Rate and Rhythm: Normal rate and regular rhythm.  Pulmonary:  Effort: Pulmonary effort is normal.  Musculoskeletal:     Comments: NROM of neck.  NROM of right shoulder.  No pain to palpation over cspine or right shoulder.  Negative tinels or phalens.  Strength 5/5 upper extermity.   Neurological:     General: No focal deficit present.     Mental Status: She is alert and oriented to person, place, and time.           Assessment & Plan:  Marland KitchenMarland KitchenRonda was seen today for arm problem and eye problem.  Diagnoses and all orders for this visit:  Type 2 diabetes mellitus with hyperglycemia, with long-term  current use of insulin (HCC) -     POCT glycosylated hemoglobin (Hb A1C) -     Ambulatory referral to Ophthalmology  Vision changes -     Ambulatory referral to Ophthalmology  Numbness and tingling of right arm -     cyclobenzaprine (FLEXERIL) 10 MG tablet; Take 1 tablet (10 mg total) by mouth 3 (three) times daily as needed for muscle spasms. -     predniSONE (DELTASONE) 50 MG tablet; Take one tablet for 5 days.   .. Lab Results  Component Value Date   HGBA1C 7.7 (A) 08/08/2019   DM not controlled.  Needs to follow up with endocrinologist.  In the mean time discussed diet changes.  BP to goal.  Discussed going to an opthalmology for follow up on vision changes.  Consider artifical tear drops for lubrication.   Right arm sounds like some radiculitis.  Prednisone burst. Flexeril.  Consider massage.  Given HO for neck exercises.  If not improving get xray.  Follow up in 4 weeks.

## 2019-08-17 ENCOUNTER — Other Ambulatory Visit: Payer: Self-pay

## 2019-08-17 ENCOUNTER — Ambulatory Visit (INDEPENDENT_AMBULATORY_CARE_PROVIDER_SITE_OTHER): Payer: 59 | Admitting: Psychiatry

## 2019-08-17 ENCOUNTER — Encounter (HOSPITAL_COMMUNITY): Payer: Self-pay | Admitting: Psychiatry

## 2019-08-17 DIAGNOSIS — F410 Panic disorder [episodic paroxysmal anxiety] without agoraphobia: Secondary | ICD-10-CM

## 2019-08-17 DIAGNOSIS — F322 Major depressive disorder, single episode, severe without psychotic features: Secondary | ICD-10-CM

## 2019-08-17 DIAGNOSIS — F4310 Post-traumatic stress disorder, unspecified: Secondary | ICD-10-CM

## 2019-08-17 DIAGNOSIS — F332 Major depressive disorder, recurrent severe without psychotic features: Secondary | ICD-10-CM

## 2019-08-17 DIAGNOSIS — F411 Generalized anxiety disorder: Secondary | ICD-10-CM

## 2019-08-17 NOTE — Progress Notes (Signed)
[facility] tele follow up visit   Patient Identification: Jasmine [name] MRN:  [id number] Date of Evaluation:  08/17/2019 Referral Source: Her therapist [place] Records       I connected with [name] on 08/17/19 at  3:30 PM EST by telephone and verified that I am speaking with the correct person using two identifiers.   I discussed the limitations, risks, security and privacy concerns of performing an evaluation and management service by telephone and the availability of in person appointments. I also discussed with the patient that there may be a patient responsible charge related to this service. The patient expressed understanding and agreed to proceed.     Chief Complaint:    depression follow up Visit Diagnosis:    ICD-10-CM   1. Severe major depression, single episode, without psychotic features ([place])  F32.2   2. Generalized anxiety disorder  F41.1   3. PTSD (post-traumatic stress disorder)  F43.10   4. Anxiety state  F41.1     History of Present Illness: Jasmine [name] is 54 -year-old African-American married,   Going thru grief, has joined work but struggling to get update with [employer] work Trying to get in home work but not ready yet  Also doing therapy Tolerating meds. Says meds do help but gets anxious at work due to stress Has daughters support Works at [employer] and has gone thru some harrassment concerns in the past.   Aggravating factor:  grief, harrassment  Modifying factor: few friends, daughter  Past Psychiatric History: Patient denies any history of psychiatric inpatient treatment, suicidal attempt, self abusive behavior, traumatic brain injury or any history of abuse.  Previous Psychotropic Medications: Yes   Substance Abuse History in the last 12 months:  No.  Consequences of Substance Abuse: Negative  Past Medical History:  Past Medical History:  Diagnosis Date  . Anxiety   . Arthritis    "knees" (03/28/2014)  . Chronic renal insufficiency    stage II  (mild)  . Depression   . Headache    with high blood sugar  . Hypercholesteremia   . Hypertension   . Numbness and tingling of right arm and leg   . Seasonal allergies   . Type II diabetes mellitus ([place])   . Umbilical hernia without mention of obstruction or gangrene   . Wears glasses     Past Surgical History:  Procedure Laterality Date  . INSERTION OF MESH N/A 03/28/2014   Procedure: INSERTION OF MESH;  Surgeon: Ralene Ok, MD;  Location: [place];  Service: General;  Laterality: N/A;  . INSERTION OF MESH N/A 05/08/2015   Procedure: INSERTION OF MESH;  Surgeon: Ralene Ok, MD;  Location: [place];  Service: General;  Laterality: N/A;  . LAPAROSCOPIC LYSIS OF ADHESIONS N/A 05/08/2015   Procedure: LAPAROSCOPIC LYSIS OF ADHESIONS;  Surgeon: Ralene Ok, MD;  Location: [place];  Service: General;  Laterality: N/A;  . MULTIPLE TOOTH EXTRACTIONS  ~ 2003  . TONSILLECTOMY    . [place]  . VENTRAL HERNIA REPAIR N/A 03/28/2014   Procedure: LAPAROSCOPIC VENTRAL HERNIA;  Surgeon: Ralene Ok, MD;  Location: [place];  Service: General;  Laterality: N/A;  . VENTRAL HERNIA REPAIR N/A 05/08/2015   Procedure: LAPAROSCOPIC VENTRAL HERNIA REPAIR WITH MESH;  Surgeon: Ralene Ok, MD;  Location: [place];  Service: General;  Laterality: N/A;    Family Psychiatric History: Viewed  Family History:  Family History  Problem Relation Age of Onset  . Diabetes Mother   .  High Cholesterol Mother   . Obesity Mother   . Diabetes Father   . Cancer Father        Prostate cancer  . Cancer Sister        Cervical cancer  . Diabetes Brother   . Bipolar disorder Brother   . Alcohol abuse Brother   . Drug abuse Brother   . Diabetes Sister   . Diabetes Brother     Social History:   Social History   Socioeconomic History  . Marital status: Married    Spouse name: [name]  . Number of children: 1  . Years of education: Not on file  . Highest education level: Not on  file  Occupational History  . Not on file  Tobacco Use  . Smoking status: Former Smoker    Packs/day: 0.25    Years: 26.00    Pack years: 6.50    Types: Cigarettes  . Smokeless tobacco: Never Used  Substance and Sexual Activity  . Alcohol use: Yes    Comment: "have a drink a few times/year"  . Drug use: No  . Sexual activity: Not Currently  Other Topics Concern  . Not on file  Social History Narrative  . Not on file   Social Determinants of Health   Financial Resource Strain:   . Difficulty of Paying Living Expenses: Not on file  Food Insecurity:   . Worried About Charity fundraiser in the Last Year: Not on file  . Ran Out of Food in the Last Year: Not on file  Transportation Needs:   . Lack of Transportation (Medical): Not on file  . Lack of Transportation (Non-Medical): Not on file  Physical Activity:   . Days of Exercise per Week: Not on file  . Minutes of Exercise per Session: Not on file  Stress:   . Feeling of Stress : Not on file  Social Connections:   . Frequency of Communication with Friends and Family: Not on file  . Frequency of Social Gatherings with Friends and Family: Not on file  . Attends Religious Services: Not on file  . Active Member of Clubs or Organizations: Not on file  . Attends Archivist Meetings: Not on file  . Marital Status: Not on file     Allergies:   Allergies  Allergen Reactions  . Eggs Or Egg-Derived Products Hives and Other (See Comments)    FLU VACCINE: caused hives    . Influenza Vaccines Hives    Vaccine from Egg derived product causes Hives  . Effexor [Venlafaxine]     "felt weird"  . Metformin And Related Swelling    ANGIOEDEMA    Metabolic Disorder Labs: Lab Results  Component Value Date   HGBA1C 7.7 (A) 08/08/2019   MPG 384 (H) 01/09/2015   MPG 160 (H) 03/28/2014   No results found for: PROLACTIN Lab Results  Component Value Date   CHOL 174 06/14/2018   TRIG 123 06/14/2018   HDL 46 06/14/2018    CHOLHDL 4 09/09/2017   VLDL 41.4 (H) 09/09/2017   LDLCALC 103 (H) 06/14/2018   LDLCALC 98 10/07/2017     Current Medications: Current Outpatient Medications  Medication Sig Dispense Refill  . Accu-Chek FastClix Lancets MISC USE UP TO 3 TIMES DAILY    . ACCU-CHEK GUIDE test strip USE UP TO 3 TIMES DAILY    . blood glucose meter kit and supplies Dispense based on patient and insurance preference. Use up to three times  daily as directed. (FOR ICD-10 E10.9, E11.9). 1 each 0  . buPROPion (WELLBUTRIN XL) 300 MG 24 hr tablet Take 1 tablet (300 mg total) by mouth daily. 90 tablet 1  . cyclobenzaprine (FLEXERIL) 10 MG tablet Take 1 tablet (10 mg total) by mouth 3 (three) times daily as needed for muscle spasms. 30 tablet 0  . dapagliflozin propanediol (FARXIGA) 10 MG TABS tablet Take 10 mg by mouth daily. NEEDS APPT 30 tablet 0  . gabapentin (NEURONTIN) 300 MG capsule Take 1 capsule (300 mg total) by mouth 2 (two) times daily. 60 capsule 1  . Insulin Degludec (TRESIBA FLEXTOUCH) 200 UNIT/ML SOPN Inject 56 Units into the skin daily. 15 pen 3  . olmesartan-hydrochlorothiazide (BENICAR HCT) 20-12.5 MG tablet Take 1 tablet by mouth daily. 90 tablet 1  . predniSONE (DELTASONE) 50 MG tablet Take one tablet for 5 days. 5 tablet 0  . sertraline (ZOLOFT) 100 MG tablet Take one and one/half tablet daily. 135 tablet 1  . simvastatin (ZOCOR) 10 MG tablet Take 1 tablet (10 mg total) by mouth daily. NEED LABS 90 tablet 0  . Topiramate ER (TROKENDI XR) 50 MG CP24 Take 1 tablet by mouth daily. 90 capsule 1  . TRULICITY 1.5 AE/4.9PN SOPN INJECT 1.5 MG INTO THE SKIN ONCE A WEEK. 6 pen 0  . Vitamin D, Ergocalciferol, (DRISDOL) 1.25 MG (50000 UT) CAPS capsule Take 1 capsule (50,000 Units total) by mouth every 7 (seven) days. 12 capsule 0   No current facility-administered medications for this visit.      Psychiatric Specialty Exam: Review of Systems  Cardiovascular: Negative for chest pain.  Skin: Negative for  rash.    There were no vitals taken for this visit.There is no height or weight on file to calculate BMI.  General Appearance:  Eye Contact:   Speech:  Slow  Volume:  Decreased  Mood: somewhat stressed  Affect congruent  Thought Process:  Goal Directed  Orientation:  Full (Time, Place, and Person)  Thought Content:  Rumination  Suicidal Thoughts:  No  Homicidal Thoughts:  No  Memory:  Immediate;   Fair Recent;   Fair Remote;   Fair  Judgement:  Good  Insight:  Good  Psychomotor Activity:  Concentration:  Concentration: Fair and Attention Span: Fair  Recall:  Good  Fund of Knowledge:Good  Language: Good  Akathisia:  No  Handed:  Right  AIMS (if indicated):  0  Assets:  Communication Skills Desire for Improvement Housing  ADL's:  Intact  Cognition: WNL  Sleep:  fair    Treatment Plan Summary: Major depressive disorder, recurrent severe:somewhat subdued due to work stress. Continue wellbutrin and therapy   Generalized anxiety : fluctuates, continue zoloft Panic attacks : sporadic, continue zoloft and therapy Provided supportive therapy.   Not suicidal and has support from family    I discussed the assessment and treatment plan with the patient. The patient was provided an opportunity to ask questions and all were answered. The patient agreed with the plan and demonstrated an understanding of the instructions.   The patient was advised to call back or seek an in-person evaluation if the symptoms worsen or if the condition fails to improve as anticipated.  I provided 15 minutes of non-face-to-face time during this encounter. Fu 69m   NMerian Capron MD 1/7/20213:44 PM

## 2019-08-20 ENCOUNTER — Other Ambulatory Visit: Payer: Self-pay | Admitting: Physician Assistant

## 2019-08-20 DIAGNOSIS — E1122 Type 2 diabetes mellitus with diabetic chronic kidney disease: Secondary | ICD-10-CM

## 2019-08-21 ENCOUNTER — Encounter: Payer: Self-pay | Admitting: Physician Assistant

## 2019-08-21 DIAGNOSIS — R200 Anesthesia of skin: Secondary | ICD-10-CM | POA: Insufficient documentation

## 2019-08-21 DIAGNOSIS — H539 Unspecified visual disturbance: Secondary | ICD-10-CM | POA: Insufficient documentation

## 2019-08-28 ENCOUNTER — Telehealth: Payer: Self-pay | Admitting: Sports Medicine

## 2019-08-28 NOTE — Telephone Encounter (Signed)
Patient called and her work is requesting that changes be made by the MD that was covering for her FMLA. I am placing the forms in your basket. Please advise.

## 2019-08-29 ENCOUNTER — Ambulatory Visit (INDEPENDENT_AMBULATORY_CARE_PROVIDER_SITE_OTHER): Payer: BC Managed Care – PPO | Admitting: Sports Medicine

## 2019-08-29 ENCOUNTER — Other Ambulatory Visit: Payer: Self-pay

## 2019-08-29 DIAGNOSIS — M170 Bilateral primary osteoarthritis of knee: Secondary | ICD-10-CM | POA: Diagnosis not present

## 2019-08-29 NOTE — Progress Notes (Signed)
    Procedures performed today:    None.  Independent interpretation of tests performed by another provider:   None.  Impression and Recommendations:    Primary osteoarthritis of both knees FMLA paperwork modified today with the patient.    ___________________________________________ Ihor Austin. Benjamin Stain, M.D., ABFM., CAQSM. Primary Care and Sports Medicine [facility]  Adjunct Instructor of Family Medicine  [employer]

## 2019-08-29 NOTE — Telephone Encounter (Signed)
She was supposed to fill this out correctly.  No more.  We are doing this in person, appt needed.  Amber knows already I think.

## 2019-08-29 NOTE — Assessment & Plan Note (Signed)
FMLA paperwork modified today with the patient.

## 2019-08-29 NOTE — Telephone Encounter (Signed)
Left message that patient would need an appointment and to call the office.

## 2019-08-29 NOTE — Telephone Encounter (Signed)
Patient has visit with provider who filled out paperwork.

## 2019-10-06 ENCOUNTER — Encounter: Payer: Self-pay | Admitting: Physician Assistant

## 2019-10-13 ENCOUNTER — Other Ambulatory Visit: Payer: Self-pay | Admitting: Internal Medicine

## 2019-10-13 DIAGNOSIS — E1122 Type 2 diabetes mellitus with diabetic chronic kidney disease: Secondary | ICD-10-CM

## 2019-10-13 DIAGNOSIS — Z794 Long term (current) use of insulin: Secondary | ICD-10-CM

## 2019-10-19 ENCOUNTER — Ambulatory Visit (INDEPENDENT_AMBULATORY_CARE_PROVIDER_SITE_OTHER): Payer: BC Managed Care – PPO | Admitting: Sports Medicine

## 2019-10-19 ENCOUNTER — Ambulatory Visit (INDEPENDENT_AMBULATORY_CARE_PROVIDER_SITE_OTHER): Payer: BC Managed Care – PPO

## 2019-10-19 ENCOUNTER — Other Ambulatory Visit: Payer: Self-pay | Admitting: *Deleted

## 2019-10-19 ENCOUNTER — Other Ambulatory Visit: Payer: Self-pay

## 2019-10-19 ENCOUNTER — Other Ambulatory Visit: Payer: Self-pay | Admitting: Physician Assistant

## 2019-10-19 DIAGNOSIS — M170 Bilateral primary osteoarthritis of knee: Secondary | ICD-10-CM

## 2019-10-19 DIAGNOSIS — M1712 Unilateral primary osteoarthritis, left knee: Secondary | ICD-10-CM

## 2019-10-19 DIAGNOSIS — M7542 Impingement syndrome of left shoulder: Secondary | ICD-10-CM | POA: Insufficient documentation

## 2019-10-19 DIAGNOSIS — E785 Hyperlipidemia, unspecified: Secondary | ICD-10-CM

## 2019-10-19 DIAGNOSIS — E1169 Type 2 diabetes mellitus with other specified complication: Secondary | ICD-10-CM

## 2019-10-19 MED ORDER — MELOXICAM 15 MG PO TABS: ORAL_TABLET | ORAL | 3 refills | Status: DC

## 2019-10-19 NOTE — Assessment & Plan Note (Signed)
This pleasant 54 year old female returns, she has recurrence of left knee pain, this was last injected in April 2019. Repeat left knee injection today, return as needed.

## 2019-10-19 NOTE — Assessment & Plan Note (Signed)
Mild impingement signs with a positive Hawkins and Neer test. Cuff strength is overall good, this is likely impingement syndrome/shoulder bursitis. Adding rotator cuff strengthening exercises, x-rays, return to see me in 6 weeks, injection if no better.

## 2019-10-19 NOTE — Progress Notes (Signed)
    Procedures performed today:    Procedure: Real-time Ultrasound Guided injection of the left knee Device: Samsung HS60  Verbal informed consent obtained.  Time-out conducted.  Noted no overlying erythema, induration, or other signs of local infection.  Skin prepped in a sterile fashion.  Local anesthesia: Topical Ethyl chloride.  With sterile technique and under real time ultrasound guidance: 1 cc Kenalog 40, 2 cc lidocaine, 2 cc bupivacaine injected easily Completed without difficulty  Pain immediately resolved suggesting accurate placement of the medication.  Advised to call if fevers/chills, erythema, induration, drainage, or persistent bleeding.  Images permanently stored and available for review in the ultrasound unit.  Impression: Technically successful ultrasound guided injection.  Independent interpretation of notes and tests performed by another provider:   None.  Impression and Recommendations:    Primary osteoarthritis of both knees This pleasant 54 year old female returns, she has recurrence of left knee pain, this was last injected in April 2019. Repeat left knee injection today, return as needed.  Impingement syndrome, shoulder, left Mild impingement signs with a positive Hawkins and Neer test. Cuff strength is overall good, this is likely impingement syndrome/shoulder bursitis. Adding rotator cuff strengthening exercises, x-rays, return to see me in 6 weeks, injection if no better.    ___________________________________________ Ihor Austin. Benjamin Stain, M.D., ABFM., CAQSM. Primary Care and Sports Medicine [facility]  Adjunct Instructor of Family Medicine  [employer]

## 2019-10-20 ENCOUNTER — Other Ambulatory Visit: Payer: Self-pay | Admitting: Physician Assistant

## 2019-10-20 DIAGNOSIS — R519 Headache, unspecified: Secondary | ICD-10-CM

## 2019-10-31 ENCOUNTER — Encounter: Payer: Self-pay | Admitting: Physician Assistant

## 2019-11-01 ENCOUNTER — Ambulatory Visit (INDEPENDENT_AMBULATORY_CARE_PROVIDER_SITE_OTHER): Payer: BC Managed Care – PPO | Admitting: Physician Assistant

## 2019-11-01 ENCOUNTER — Encounter: Payer: Self-pay | Admitting: Physician Assistant

## 2019-11-01 ENCOUNTER — Other Ambulatory Visit: Payer: Self-pay

## 2019-11-01 VITALS — BP 128/81 | HR 93 | Ht 62.0 in | Wt 242.0 lb

## 2019-11-01 DIAGNOSIS — E785 Hyperlipidemia, unspecified: Secondary | ICD-10-CM

## 2019-11-01 DIAGNOSIS — E1165 Type 2 diabetes mellitus with hyperglycemia: Secondary | ICD-10-CM | POA: Diagnosis not present

## 2019-11-01 DIAGNOSIS — Z794 Long term (current) use of insulin: Secondary | ICD-10-CM

## 2019-11-01 DIAGNOSIS — E559 Vitamin D deficiency, unspecified: Secondary | ICD-10-CM | POA: Diagnosis not present

## 2019-11-01 DIAGNOSIS — I10 Essential (primary) hypertension: Secondary | ICD-10-CM

## 2019-11-01 DIAGNOSIS — E1169 Type 2 diabetes mellitus with other specified complication: Secondary | ICD-10-CM

## 2019-11-01 DIAGNOSIS — E1122 Type 2 diabetes mellitus with diabetic chronic kidney disease: Secondary | ICD-10-CM | POA: Diagnosis not present

## 2019-11-01 DIAGNOSIS — E538 Deficiency of other specified B group vitamins: Secondary | ICD-10-CM

## 2019-11-01 LAB — POCT GLYCOSYLATED HEMOGLOBIN (HGB A1C): Hemoglobin A1C: 7.8 % — AB (ref 4.0–5.6)

## 2019-11-01 MED ORDER — VITAMIN D (ERGOCALCIFEROL) 1.25 MG (50000 UNIT) PO CAPS: 50000.0000 [IU] | ORAL_CAPSULE | ORAL | 1 refills | Status: DC

## 2019-11-01 MED ORDER — FARXIGA 10 MG PO TABS: ORAL_TABLET | ORAL | 0 refills | Status: DC

## 2019-11-01 MED ORDER — SIMVASTATIN 10 MG PO TABS: ORAL_TABLET | ORAL | 3 refills | Status: DC

## 2019-11-01 MED ORDER — OLMESARTAN MEDOXOMIL-HCTZ 20-12.5 MG PO TABS: 1.0000 | ORAL_TABLET | Freq: Every day | ORAL | 1 refills | Status: DC

## 2019-11-01 MED ORDER — TRULICITY 3 MG/0.5ML ~~LOC~~ SOAJ: 3.0000 mg | SUBCUTANEOUS | 0 refills | Status: DC

## 2019-11-01 MED ORDER — TRULICITY 3 MG/0.5ML ~~LOC~~ SOAJ: 3.0000 mg | SUBCUTANEOUS | 2 refills | Status: DC

## 2019-11-01 MED ORDER — FARXIGA 10 MG PO TABS: ORAL_TABLET | ORAL | 2 refills | Status: DC

## 2019-11-01 NOTE — Progress Notes (Signed)
Subjective:    Patient ID: Jasmine [name], female    DOB: 08-08-1966, 54 y.o.   MRN: [id number]  HPI  Pt is a 54 yo morbidly obese female with T2DM, HTN, HLD, MDD, anxiety who presents to the clinic for 3 month follow up.   She is not checking sugars. She is trying to be active but having a hard time with diet. No open sores or wounds. No hypoglycemic events. Taking Mariana Single, trulicity.   She sees [facility]. She is going to counseling weekly. She has some issues with her daughter and her husband right now but overall feels pretty good.   Pt is very frustrated with her weight. She wants to discuss other options. She has tried numerous plans, diet, pills with no benefit or gains weight back.   .. Active Ambulatory Problems    Diagnosis Date Noted  . Depression 02/09/2010  . ALLERGIC RHINITIS 02/09/2010  . Morbid obesity with BMI of 50.0-59.9, adult ([place]) 06/03/2012  . Hyperlipidemia 11/27/2013  . Primary osteoarthritis of both knees 11/27/2013  . Chronic renal insufficiency, stage II (mild) 11/28/2013  . Umbilical hernia without obstruction and without gangrene 02/02/2014  . Ventral hernia, recurrent 03/28/2014  . Essential hypertension 01/09/2015  . Diabetic retinopathy ([place]) 03/12/2016  . Hyperlipidemia associated with type 2 diabetes mellitus ([place]) 06/17/2016  . Anxiety state 06/17/2016  . Tobacco use 08/13/2016  . Micturition syncope 08/13/2016  . Vitamin D deficiency 11/27/2016  . B12 deficiency 11/27/2016  . Frequent headaches 01/05/2017  . Menopause 05/13/2017  . Severe episode of recurrent major depressive disorder, without psychotic features ([place]) 05/19/2017  . Generalized anxiety disorder 05/19/2017  . Severe major depression, single episode, without psychotic features ([place]) 05/26/2017  . Type 2 diabetes mellitus with chronic kidney disease, with long-term current use of insulin ([place]) 07/12/2017  . Acute stress reaction 03/09/2018  . Class 3 severe obesity due to  excess calories with serious comorbidity and body mass index (BMI) of 45.0 to 49.9 in adult ([place]) 11/21/2018  . Vision changes 08/21/2019  . Numbness and tingling of right arm 08/21/2019  . Impingement syndrome, shoulder, left 10/19/2019   Resolved Ambulatory Problems    Diagnosis Date Noted  . No Resolved Ambulatory Problems   Past Medical History:  Diagnosis Date  . Anxiety   . Arthritis   . Chronic renal insufficiency   . Headache   . Hypercholesteremia   . Hypertension   . Numbness and tingling of right arm and leg   . Seasonal allergies   . Type II diabetes mellitus ([place])   . Umbilical hernia without mention of obstruction or gangrene   . Wears glasses      Review of Systems See HPI.     Objective:   Physical Exam Vitals reviewed.  Constitutional:      Appearance: Normal appearance. She is obese.  HENT:     Head: Normocephalic.  Cardiovascular:     Rate and Rhythm: Normal rate and regular rhythm.     Pulses: Normal pulses.     Heart sounds: Normal heart sounds.  Pulmonary:     Effort: Pulmonary effort is normal.     Breath sounds: Normal breath sounds.  Neurological:     General: No focal deficit present.     Mental Status: She is alert and oriented to person, place, and time.  Psychiatric:        Mood and Affect: Mood normal.  Assessment & Plan:  Marland KitchenMarland KitchenDiagnoses and all orders for this visit:  Type 2 diabetes mellitus with hyperglycemia, with long-term current use of insulin (HCC) -     POCT glycosylated hemoglobin (Hb A1C) -     Dulaglutide (TRULICITY) 3 MG/0.5ML SOPN; Inject 3 mg as directed once a week. -     Amb Referral to Bariatric Surgery  Type 2 diabetes mellitus with chronic kidney disease, with long-term current use of insulin, unspecified CKD stage (HCC) -     Discontinue: dapagliflozin propanediol (FARXIGA) 10 MG TABS tablet; TAKE 1 TABLET BY MOUTH DAILY. -     Dulaglutide (TRULICITY) 3 MG/0.5ML SOPN; Inject 3 mg as directed once  a week. -     dapagliflozin propanediol (FARXIGA) 10 MG TABS tablet; TAKE 1 TABLET BY MOUTH DAILY. -     COMPLETE METABOLIC PANEL WITH GFR -     Amb Referral to Bariatric Surgery  Vitamin D deficiency -     Vitamin D, Ergocalciferol, (DRISDOL) 1.25 MG (50000 UNIT) CAPS capsule; Take 1 capsule (50,000 Units total) by mouth every 7 (seven) days. -     VITAMIN D 25 Hydroxy (Vit-D Deficiency, Fractures)  Essential hypertension -     olmesartan-hydrochlorothiazide (BENICAR HCT) 20-12.5 MG tablet; Take 1 tablet by mouth daily. -     COMPLETE METABOLIC PANEL WITH GFR -     Amb Referral to Bariatric Surgery  Hyperlipidemia associated with type 2 diabetes mellitus (HCC) -     simvastatin (ZOCOR) 10 MG tablet; TAKE 1 TABLET (10 MG TOTAL) BY MOUTH DAILY. -     Lipid Panel w/reflex Direct LDL -     COMPLETE METABOLIC PANEL WITH GFR -     Amb Referral to Bariatric Surgery  Low serum vitamin B12 -     B12  Morbid obesity (HCC) -     Amb Referral to Bariatric Surgery  Other orders -     Discontinue: Dulaglutide (TRULICITY) 3 MG/0.5ML SOPN; Inject 3 mg as directed once a week.   Marland Kitchen Results for orders placed or performed in visit on 11/01/19  POCT glycosylated hemoglobin (Hb A1C)  Result Value Ref Range   Hemoglobin A1C 7.8 (A) 4.0 - 5.6 %   HbA1c POC (<> result, manual entry)     HbA1c, POC (prediabetic range)     HbA1c, POC (controlled diabetic range)     A1C not controlled and up from last visit.  Increased trulicity to 3mg  weekly.  Continue other medications.  On ARB. BP to goal.  On statin. Lipid panel ordered.  .. Diabetic Foot Exam - Simple   Simple Foot Form Diabetic Foot exam was performed with the following findings: Yes 11/01/2019 10:17 AM  Visual Inspection No deformities, no ulcerations, no other skin breakdown bilaterally: Yes Sensation Testing Intact to touch and monofilament testing bilaterally: Yes Pulse Check Posterior Tibialis and Dorsalis pulse intact  bilaterally: Yes Comments    Flu and pneumonia UTD.  Eye exam UTD.   11/03/2019.Discussed low carb diet with 1500 calories and 80g of protein.  Exercising at least 150 minutes a week.  My Fitness Pal could be a Marland Kitchen.  Pt has tried Chief Technology Officer, Doylene Bode, [facility] obesity wellness plan,  Phentermine, wellbutrin, topamax, trulicity with no benefit.  Referral made to discuss surgery.   Fasting labs ordered for management.   Follow up in 3 months.

## 2019-11-02 ENCOUNTER — Other Ambulatory Visit: Payer: Self-pay | Admitting: Physician Assistant

## 2019-11-02 LAB — LIPID PANEL W/REFLEX DIRECT LDL
Cholesterol: 272 mg/dL — ABNORMAL HIGH (ref <200)
HDL: 45 mg/dL — ABNORMAL LOW (ref >=50)
LDL Cholesterol (Calc): 193 mg/dL (calc) — ABNORMAL HIGH
Non-HDL Cholesterol (Calc): 227 mg/dL (calc) — ABNORMAL HIGH (ref <130)
Total CHOL/HDL Ratio: 6 (calc) — ABNORMAL HIGH (ref <5.0)
Triglycerides: 171 mg/dL — ABNORMAL HIGH (ref <150)

## 2019-11-02 LAB — COMPLETE METABOLIC PANEL WITH GFR
AG Ratio: 1.4 (calc) (ref 1.0–2.5)
ALT: 8 U/L (ref 6–29)
AST: 8 U/L — ABNORMAL LOW (ref 10–35)
Albumin: 4.1 g/dL (ref 3.6–5.1)
Alkaline phosphatase (APISO): 70 U/L (ref 37–153)
BUN/Creatinine Ratio: 8 (calc) (ref 6–22)
BUN: 11 mg/dL (ref 7–25)
CO2: 22 mmol/L (ref 20–32)
Calcium: 9.9 mg/dL (ref 8.6–10.4)
Chloride: 106 mmol/L (ref 98–110)
Creat: 1.36 mg/dL — ABNORMAL HIGH (ref 0.50–1.05)
GFR, Est African American: 52 mL/min/{1.73_m2} — ABNORMAL LOW (ref >=60)
GFR, Est Non African American: 45 mL/min/{1.73_m2} — ABNORMAL LOW (ref >=60)
Globulin: 2.9 g/dL (calc) (ref 1.9–3.7)
Glucose, Bld: 139 mg/dL — ABNORMAL HIGH (ref 65–99)
Potassium: 4.7 mmol/L (ref 3.5–5.3)
Sodium: 139 mmol/L (ref 135–146)
Total Bilirubin: 0.3 mg/dL (ref 0.2–1.2)
Total Protein: 7 g/dL (ref 6.1–8.1)

## 2019-11-02 LAB — VITAMIN B12: Vitamin B-12: 344 pg/mL (ref 200–1100)

## 2019-11-02 LAB — VITAMIN D 25 HYDROXY (VIT D DEFICIENCY, FRACTURES): Vit D, 25-Hydroxy: 33 ng/mL (ref 30–100)

## 2019-11-02 MED ORDER — SIMVASTATIN 40 MG PO TABS: 40.0000 mg | ORAL_TABLET | Freq: Every day | ORAL | 1 refills | Status: DC

## 2019-11-02 NOTE — Progress Notes (Signed)
Jasmine [name],   Your cholesterol is VERY elevated and increases your cardiovascular risk. We need to increase your zocor to 40mg . Recheck in 4 months. Goal LDL is under 70 for diabetics.   Kidney function stable. Do NOT take any NSAIDs. Make sure staying hydrated.   Vitamin D is better than 1 year ago. Keep taking high dose weekly and 1000 units daily.  B12 better than 1 year ago. Are you taking b12?

## 2019-11-03 ENCOUNTER — Other Ambulatory Visit: Payer: Self-pay | Admitting: Physician Assistant

## 2019-11-03 MED ORDER — TRAMADOL HCL 50 MG PO TABS: 50.0000 mg | ORAL_TABLET | Freq: Two times a day (BID) | ORAL | 1 refills | Status: DC

## 2019-11-03 NOTE — Progress Notes (Signed)
Let patient know sent tramadol to replace mobic every 12 hours as needed for knee pain.

## 2019-11-03 NOTE — Progress Notes (Signed)
Due to her kidney function advise to hold off on daily NSAID. You had given her mobic. Do you think tramadol is appropriate for her to replace mobic? Thoughts?

## 2019-11-03 NOTE — Progress Notes (Signed)
Ill send it in and if you want to tweak it when she follows up with you can.

## 2019-11-03 NOTE — Addendum Note (Signed)
Addended by: Jomarie Longs on: 11/03/2019 12:28 PM   Modules accepted: Orders

## 2019-11-06 ENCOUNTER — Encounter: Payer: Self-pay | Admitting: Physician Assistant

## 2019-11-06 NOTE — Progress Notes (Signed)
11

## 2019-11-09 ENCOUNTER — Encounter: Payer: Self-pay | Admitting: Physician Assistant

## 2019-11-17 IMAGING — DX CHEST - 2 VIEW
2 series · 2 of 2 positions shown · non-contrast
Comparison: August 13, 2016

CLINICAL DATA: Shortness of breath and cough

EXAM:
CHEST - 2 VIEW

[chest pa]
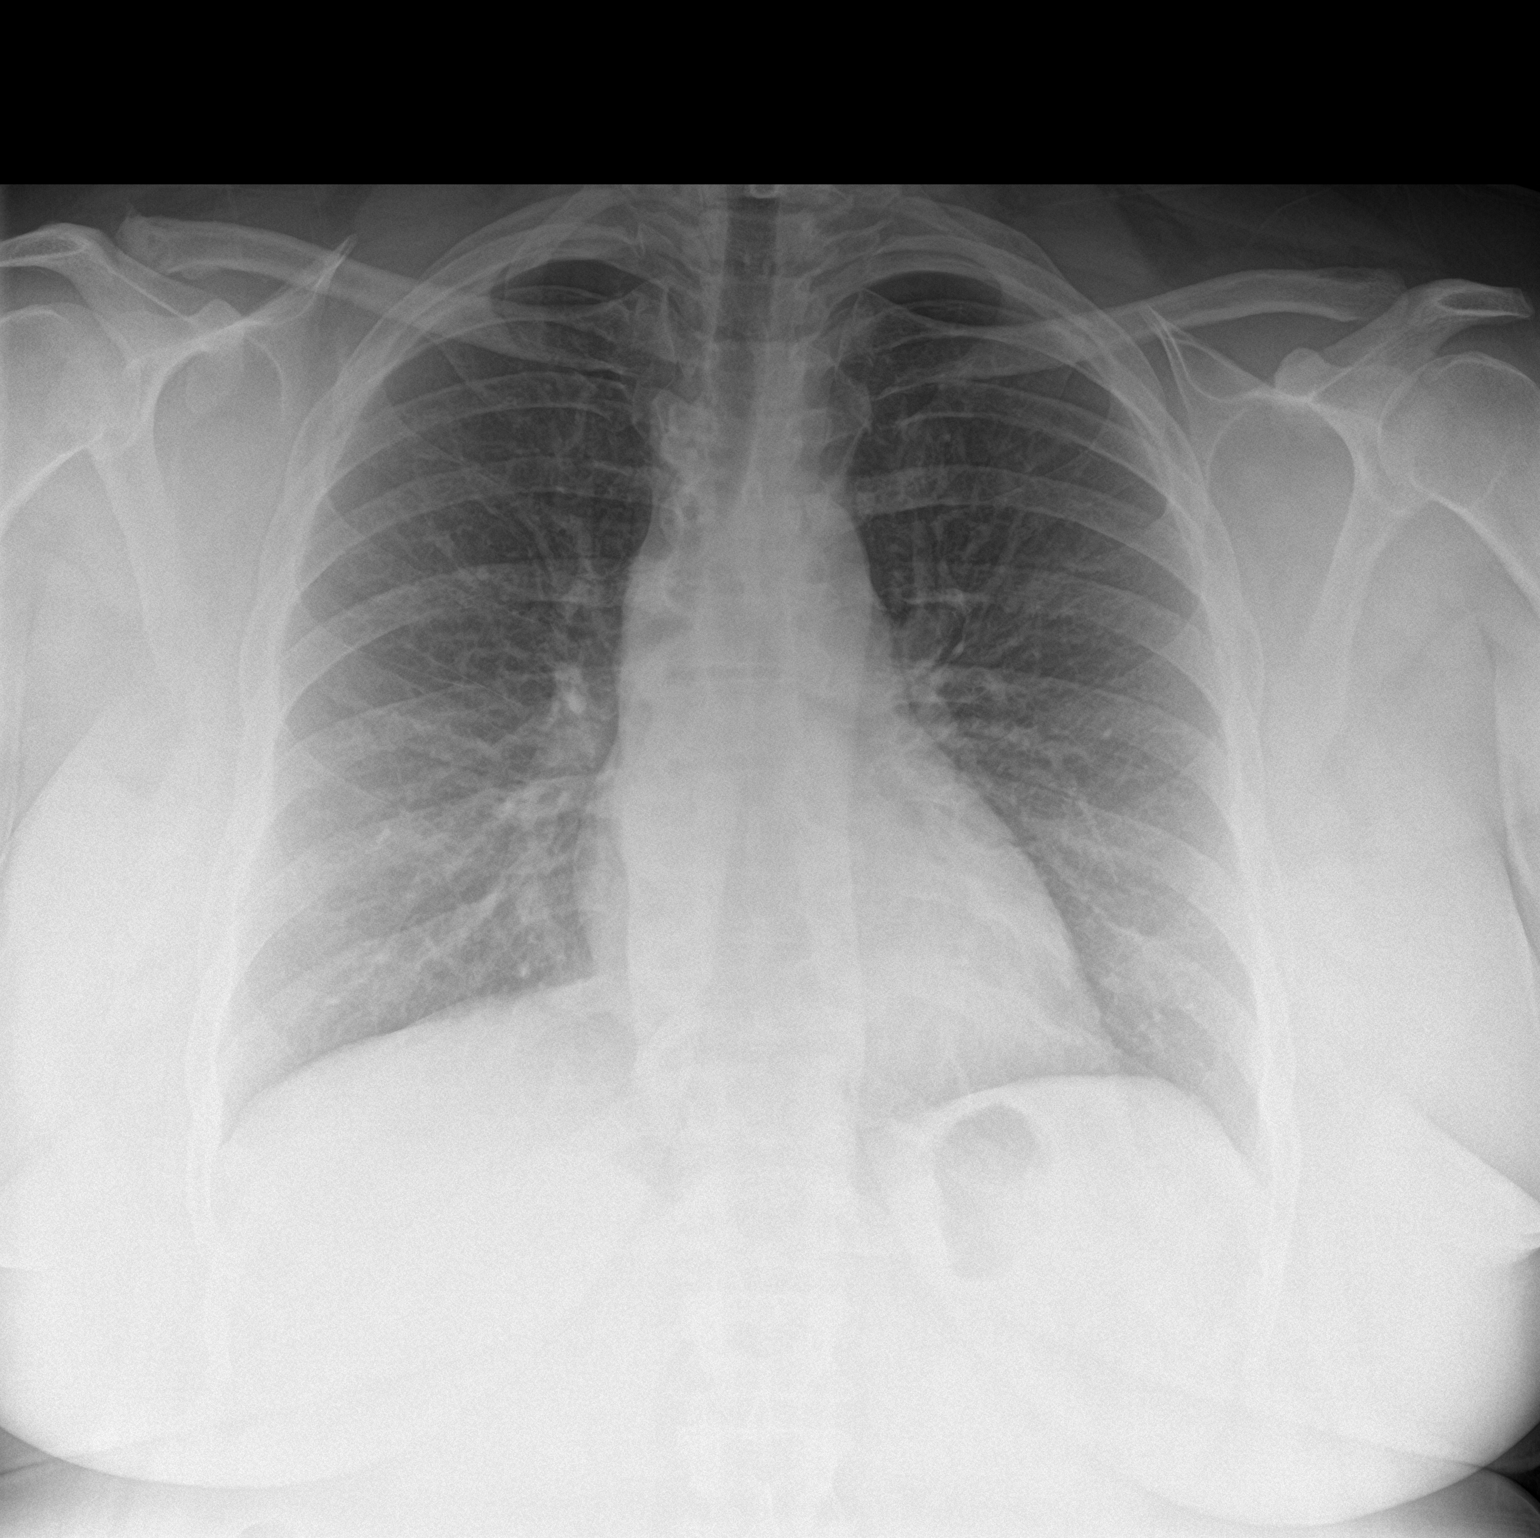

[chest lat]
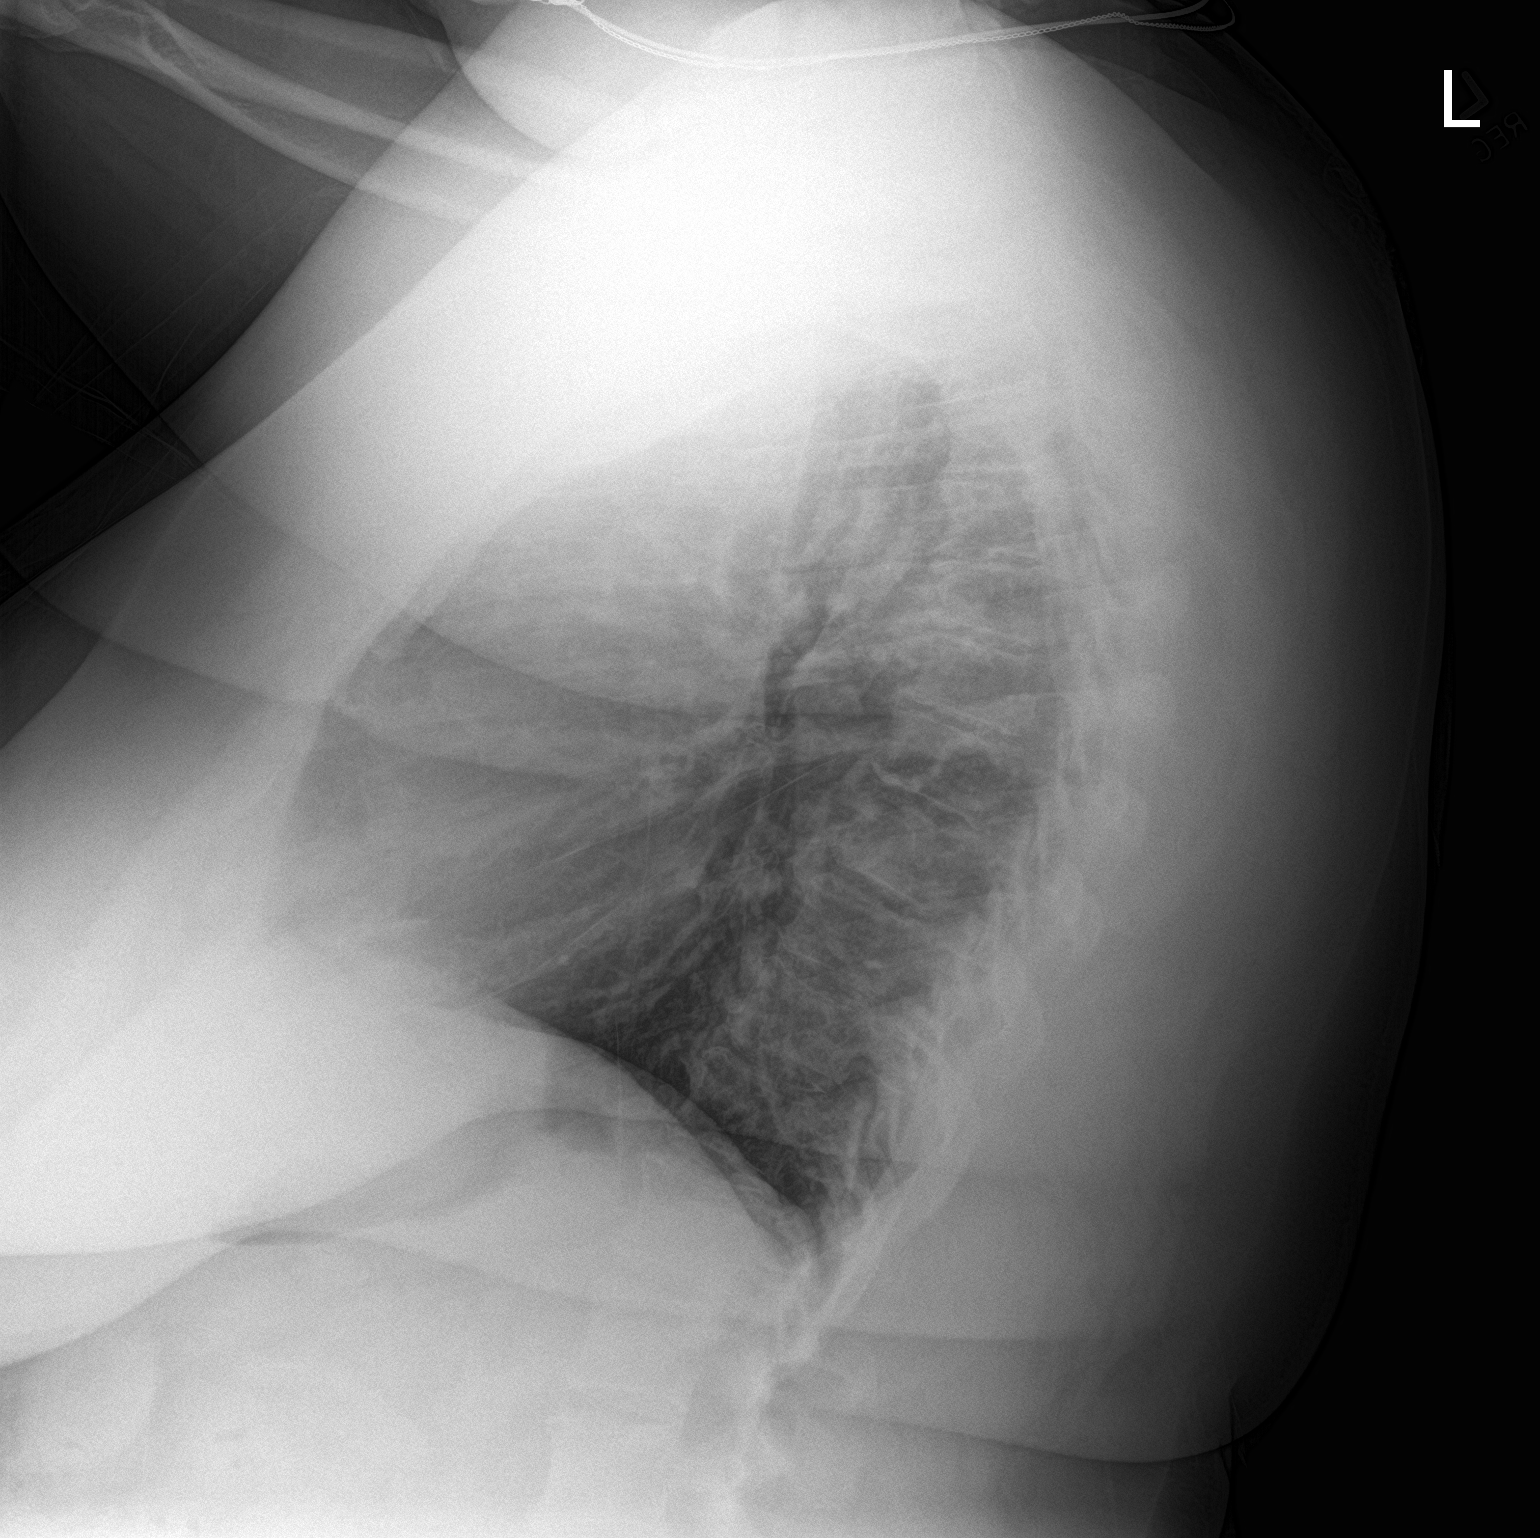

[2 of 2 positions shown; findings below may reference images not displayed]

FINDINGS: Lungs are clear. Heart size and pulmonary vascularity are normal. No
adenopathy. There is mild degenerative change in the thoracic spine.
IMPRESSION: No edema or consolidation.

## 2019-11-22 ENCOUNTER — Other Ambulatory Visit: Payer: Self-pay | Admitting: Physician Assistant

## 2019-11-22 DIAGNOSIS — F411 Generalized anxiety disorder: Secondary | ICD-10-CM

## 2019-11-22 DIAGNOSIS — F330 Major depressive disorder, recurrent, mild: Secondary | ICD-10-CM

## 2019-11-30 ENCOUNTER — Ambulatory Visit: Payer: BC Managed Care – PPO | Admitting: Sports Medicine

## 2019-12-01 ENCOUNTER — Encounter: Payer: Self-pay | Admitting: Physician Assistant

## 2019-12-12 ENCOUNTER — Encounter: Payer: Self-pay | Admitting: Physician Assistant

## 2019-12-12 NOTE — Telephone Encounter (Signed)
Thank you for advising her. It against our policy and poor medical practice to prescribe antibiotics when the patient has not been evaluated for the current illness and could result in harm to the patient. I will be happy to see her in the office or via virtual visit if she chooses.

## 2019-12-12 NOTE — Telephone Encounter (Signed)
Jasmine [name] called and left a message for a return call. Jasmine [name] states she has allergy symptoms along with wheezing. She is taking Zyrtec and Flonase with little relief. I advised patient she will need a visit. She declined.

## 2019-12-29 ENCOUNTER — Encounter: Payer: Self-pay | Admitting: Physician Assistant

## 2019-12-29 NOTE — Telephone Encounter (Signed)
Faxed labs

## 2020-01-03 ENCOUNTER — Encounter: Payer: Self-pay | Admitting: Physician Assistant

## 2020-01-16 ENCOUNTER — Other Ambulatory Visit: Payer: Self-pay | Admitting: Physician Assistant

## 2020-01-16 ENCOUNTER — Other Ambulatory Visit: Payer: Self-pay | Admitting: Internal Medicine

## 2020-01-16 DIAGNOSIS — F330 Major depressive disorder, recurrent, mild: Secondary | ICD-10-CM

## 2020-01-26 ENCOUNTER — Other Ambulatory Visit: Payer: Self-pay | Admitting: Internal Medicine

## 2020-02-23 ENCOUNTER — Telehealth: Payer: Self-pay

## 2020-02-23 NOTE — Telephone Encounter (Signed)
Patient called into office wanting to know if FMLA paper work has been faxed to insurance. Please advise

## 2020-02-23 NOTE — Telephone Encounter (Signed)
I placed it in the front office folder to fax and scan.

## 2020-02-23 NOTE — Telephone Encounter (Signed)
Patient called notified FMLA paper work being faxed. Patient voiced her understanding.

## 2020-04-24 ENCOUNTER — Other Ambulatory Visit: Payer: Self-pay | Admitting: Physician Assistant

## 2020-04-24 DIAGNOSIS — R519 Headache, unspecified: Secondary | ICD-10-CM

## 2020-04-26 ENCOUNTER — Other Ambulatory Visit: Payer: Self-pay

## 2020-04-26 MED ORDER — TRESIBA FLEXTOUCH 200 UNIT/ML ~~LOC~~ SOPN: 56.0000 [IU] | PEN_INJECTOR | Freq: Every day | SUBCUTANEOUS | 1 refills | Status: DC

## 2020-04-26 NOTE — Telephone Encounter (Signed)
Task completed. Left a detailed vm msg for pt regarding Tresiba refill sent to local pharmacy. Direct call back info provided.

## 2020-04-26 NOTE — Telephone Encounter (Signed)
Pt called requesting a med refill for [place]. Rx written by historical provider. Rx pended.

## 2020-05-08 ENCOUNTER — Other Ambulatory Visit: Payer: Self-pay | Admitting: Neurology

## 2020-05-08 MED ORDER — TRESIBA FLEXTOUCH 200 UNIT/ML ~~LOC~~ SOPN: 56.0000 [IU] | PEN_INJECTOR | Freq: Every day | SUBCUTANEOUS | 1 refills | Status: DC

## 2020-05-09 ENCOUNTER — Other Ambulatory Visit: Payer: Self-pay

## 2020-05-09 ENCOUNTER — Encounter: Payer: Self-pay | Admitting: Physician Assistant

## 2020-05-16 ENCOUNTER — Encounter: Payer: Self-pay | Admitting: Internal Medicine

## 2020-05-16 ENCOUNTER — Ambulatory Visit (INDEPENDENT_AMBULATORY_CARE_PROVIDER_SITE_OTHER): Payer: BC Managed Care – PPO | Admitting: Internal Medicine

## 2020-05-16 ENCOUNTER — Other Ambulatory Visit: Payer: Self-pay

## 2020-05-16 VITALS — BP 126/82 | HR 106 | Ht 62.0 in | Wt 243.6 lb

## 2020-05-16 DIAGNOSIS — Z794 Long term (current) use of insulin: Secondary | ICD-10-CM | POA: Diagnosis not present

## 2020-05-16 DIAGNOSIS — E1122 Type 2 diabetes mellitus with diabetic chronic kidney disease: Secondary | ICD-10-CM

## 2020-05-16 DIAGNOSIS — E785 Hyperlipidemia, unspecified: Secondary | ICD-10-CM

## 2020-05-16 DIAGNOSIS — E1169 Type 2 diabetes mellitus with other specified complication: Secondary | ICD-10-CM | POA: Diagnosis not present

## 2020-05-16 LAB — POCT GLYCOSYLATED HEMOGLOBIN (HGB A1C): Hemoglobin A1C: 11.7 % — AB (ref 4.0–5.6)

## 2020-05-16 MED ORDER — NOVOLOG FLEXPEN 100 UNIT/ML ~~LOC~~ SOPN: 12.0000 [IU] | PEN_INJECTOR | Freq: Three times a day (TID) | SUBCUTANEOUS | 3 refills | Status: DC

## 2020-05-16 MED ORDER — INSULIN PEN NEEDLE 32G X 4 MM MISC: 3 refills | Status: AC

## 2020-05-16 MED ORDER — FREESTYLE LIBRE 14 DAY READER DEVI: 1.0000 | Freq: Once | 0 refills | Status: AC

## 2020-05-16 MED ORDER — FREESTYLE LIBRE 14 DAY SENSOR MISC: 1.0000 | 3 refills | Status: DC

## 2020-05-16 NOTE — Patient Instructions (Addendum)
Please continue: - Tresiba 55 units but take it daily  Please start: - Novolog 12-20 units 15 min before the 3 meals.  Please return in 1.5 months with your sugar log.

## 2020-05-16 NOTE — Progress Notes (Signed)
Patient ID: [name], female   DOB: 1966-03-28, 54 y.o.   MRN: [id number]   This visit occurred during the SARS-CoV-2 public health emergency.  Safety protocols were in place, including screening questions prior to the visit, additional usage of staff PPE, and extensive cleaning of exam room while observing appropriate contact time as indicated for disinfecting solutions.   HPI: Jasmine [name] is a 54 y.o.-year-old female, presenting for f/u for DM2, dx in 2014, but had GDM in 1992, insulin-dependent, uncontrolled, with complications (CKD, DR).  She has been seen previously by Dr. Loanne [name].  She is not compliant with appointments.  Her previous appointment was 1.5 years ago and the previous appointment was 1 year and 3 months prior.  She is not compliant with her medicines and this is due to depression.  She goes to behavioral health. Husband was dx'ed with cancer before last visit >> died last year.  Reviewed HbA1c levels: Lab Results  Component Value Date   HGBA1C 7.8 (A) 11/01/2019   HGBA1C 7.7 (A) 08/08/2019   HGBA1C 10.8 (H) 06/14/2018   Pt was on a regimen of: - Tresiba 18 units at night (increased from 10 to 18 units 3 weeks ago) - Trulicity 1.5 mg weekly - Sat. She stopped Farxiga 10 mg daily in am in 04/2017 >> repeated yeast inf's She was on Novolog 2 units 3x a day, before meals - started 03/2017, stopped 05/2017. Metformin >> tongues swelling.  Now on: -  >> stopped 3 weeks ago 2/2 increased urination -  >> stopped in last 3 weeks 2/2 GERD, undigested food - Tresiba 20 >> 56 units daily - misses 3 days a week  Pt checks her sugars 2-3 times a day: - am: 170s >> 130s >> 132, 168-193 >> 263-390s - 2h after b'fast: n/c >> 130-160s >> n/c - before lunch: n/c - 2h after lunch: n/c >> 150-160 >> n/c - before dinner: 170s >> n/c - 2h after dinner: n/c - bedtime: 170's-180 >> 130-209 >> 150-160s >> 400s, 500 x1 - nighttime: n/c Lowest sugar was 80 (did not eat), OTW  132 >> 130 >> 132; she has hypoglycemia awareness in the 80s. Highest sugar was 186 >> 275 >> 198 >> 500.  Glucometer: FreeStyle  Pt's meals are: - Breakfast: coffee or PB sandwich + coffee - Lunch: chicken, veggies - Dinner: bowl of cereal or skips >> now 10 oz of meat and 2 cups of veggies >> late - Snacks: many: M and M, granola bar, trailmix She started to go to the weight management clinic before last visit, but not going anymore.  -+ CKD, last BUN/creatinine:  Lab Results  Component Value Date   BUN 11 11/01/2019   BUN 14 12/08/2018   CREATININE 1.36 (H) 11/01/2019   CREATININE 1.30 (H) 12/08/2018  On losartan. -+ HL; last set of lipids: Lab Results  Component Value Date   CHOL 272 (H) 11/01/2019   HDL 45 (L) 11/01/2019   LDLCALC 193 (H) 11/01/2019   LDLDIRECT 103.0 09/09/2017   TRIG 171 (H) 11/01/2019   CHOLHDL 6.0 (H) 11/01/2019  On Zocor 40 now. - last eye exam was in 07/10/19: No DR, previously the reports did show DR. - no numbness and tingling in her feet.   Pt has FH of DM in "everybody" - both sides of family: M, F, S's, B's, aunts.  ROS: Constitutional: no weight gain/no weight loss, no fatigue, no subjective hyperthermia, no subjective hypothermia, + increased thirst Eyes: +  blurry vision, no xerophthalmia ENT: no sore throat, no nodules palpated in neck, no dysphagia, no odynophagia, no hoarseness Cardiovascular: no CP/no SOB/no palpitations/no leg swelling Respiratory: no cough/no SOB/no wheezing Gastrointestinal: no N/no V/no D/no C/no acid reflux Musculoskeletal: no muscle aches/no joint aches Skin: no rashes, no hair loss Neurological: no tremors/no numbness/no tingling/no dizziness  I reviewed pt's medications, allergies, PMH, social hx, family hx, and changes were documented in the history of present illness. Otherwise, unchanged from my initial visit note.  Past Medical History:  Diagnosis Date  . Anxiety   . Arthritis    "knees" (03/28/2014)   . Chronic renal insufficiency    stage II (mild)  . Depression   . Headache    with high blood sugar  . Hypercholesteremia   . Hypertension   . Numbness and tingling of right arm and leg   . Seasonal allergies   . Type II diabetes mellitus ([place])   . Umbilical hernia without mention of obstruction or gangrene   . Wears glasses    Past Surgical History:  Procedure Laterality Date  . INSERTION OF MESH N/A 03/28/2014   Procedure: INSERTION OF MESH;  Surgeon: Ralene Ok, MD;  Location: [facility];  Service: General;  Laterality: N/A;  . INSERTION OF MESH N/A 05/08/2015   Procedure: INSERTION OF MESH;  Surgeon: Ralene Ok, MD;  Location: [place];  Service: General;  Laterality: N/A;  . LAPAROSCOPIC LYSIS OF ADHESIONS N/A 05/08/2015   Procedure: LAPAROSCOPIC LYSIS OF ADHESIONS;  Surgeon: Ralene Ok, MD;  Location: [place];  Service: General;  Laterality: N/A;  . MULTIPLE TOOTH EXTRACTIONS  ~ 2003  . TONSILLECTOMY    . [place]  . VENTRAL HERNIA REPAIR N/A 03/28/2014   Procedure: LAPAROSCOPIC VENTRAL HERNIA;  Surgeon: Ralene Ok, MD;  Location: [facility];  Service: General;  Laterality: N/A;  . VENTRAL HERNIA REPAIR N/A 05/08/2015   Procedure: LAPAROSCOPIC VENTRAL HERNIA REPAIR WITH MESH;  Surgeon: Ralene Ok, MD;  Location: [place];  Service: General;  Laterality: N/A;   Social History   Socioeconomic History  . Marital status: Married    Spouse name: [name]  . Number of children: 1  . Years of education: Not on file  . Highest education level: Not on file  Occupational History  . Not on file  Tobacco Use  . Smoking status: Former Smoker    Packs/day: 0.25    Years: 26.00    Pack years: 6.50    Types: Cigarettes  . Smokeless tobacco: Never Used  Vaping Use  . Vaping Use: Never used  Substance and Sexual Activity  . Alcohol use: Yes    Comment: "have a drink a few times/year"  . Drug use: No  . Sexual activity: Not Currently  Other  Topics Concern  . Not on file  Social History Narrative  . Not on file   Social Determinants of Health   Financial Resource Strain:   . Difficulty of Paying Living Expenses: Not on file  Food Insecurity:   . Worried About Charity fundraiser in the Last Year: Not on file  . Ran Out of Food in the Last Year: Not on file  Transportation Needs:   . Lack of Transportation (Medical): Not on file  . Lack of Transportation (Non-Medical): Not on file  Physical Activity:   . Days of Exercise per Week: Not on file  . Minutes of Exercise per Session: Not on file  Stress:   .  Feeling of Stress : Not on file  Social Connections:   . Frequency of Communication with Friends and Family: Not on file  . Frequency of Social Gatherings with Friends and Family: Not on file  . Attends Religious Services: Not on file  . Active Member of Clubs or Organizations: Not on file  . Attends Archivist Meetings: Not on file  . Marital Status: Not on file  Intimate Partner Violence:   . Fear of Current or Ex-Partner: Not on file  . Emotionally Abused: Not on file  . Physically Abused: Not on file  . Sexually Abused: Not on file   Current Outpatient Medications on File Prior to Visit  Medication Sig Dispense Refill  . Accu-Chek FastClix Lancets MISC USE UP TO 3 TIMES DAILY    . ACCU-CHEK GUIDE test strip USE UP TO 3 TIMES DAILY    . blood glucose meter kit and supplies Dispense based on patient and insurance preference. Use up to three times daily as directed. (FOR ICD-10 E10.9, E11.9). 1 each 0  . cyclobenzaprine (FLEXERIL) 10 MG tablet Take 1 tablet (10 mg total) by mouth 3 (three) times daily as needed for muscle spasms. 30 tablet 0  . dapagliflozin propanediol (FARXIGA) 10 MG TABS tablet TAKE 1 TABLET BY MOUTH DAILY. 90 tablet 0  . Dulaglutide (TRULICITY) 3 KY/7.0WC SOPN Inject 3 mg as directed once a week. 12 pen 0  . gabapentin (NEURONTIN) 300 MG capsule Take 1 capsule (300 mg total) by mouth  2 (two) times daily. 60 capsule 1  . insulin degludec (TRESIBA FLEXTOUCH) 200 UNIT/ML FlexTouch Pen Inject 56 Units into the skin daily. 27 mL 1  . meloxicam (MOBIC) 15 MG tablet One tab PO qAM with breakfast for 2 weeks, then daily prn pain. 30 tablet 3  . olmesartan-hydrochlorothiazide (BENICAR HCT) 20-12.5 MG tablet Take 1 tablet by mouth daily. 90 tablet 1  . sertraline (ZOLOFT) 100 MG tablet TAKE ONE AND ONE/HALF TABLET DAILY. 135 tablet 1  . simvastatin (ZOCOR) 40 MG tablet TAKE 1 TABLET BY MOUTH EVERYDAY AT BEDTIME 90 tablet 1  . Topiramate ER (TROKENDI XR) 50 MG CP24 Take 1 tablet by mouth daily. 90 capsule 1  . Vitamin D, Ergocalciferol, (DRISDOL) 1.25 MG (50000 UNIT) CAPS capsule Take 1 capsule (50,000 Units total) by mouth every 7 (seven) days. 12 capsule 1   No current facility-administered medications on file prior to visit.   Allergies  Allergen Reactions  . Eggs Or Egg-Derived Products Hives and Other (See Comments)    FLU VACCINE: caused hives    . Influenza Vaccines Hives    Vaccine from Egg derived product causes Hives  . Effexor [Venlafaxine]     "felt weird"  . Metformin And Related Swelling    ANGIOEDEMA   Family History  Problem Relation Age of Onset  . Diabetes Mother   . High Cholesterol Mother   . Obesity Mother   . Diabetes Father   . Cancer Father        Prostate cancer  . Cancer Sister        Cervical cancer  . Diabetes Brother   . Bipolar disorder Brother   . Alcohol abuse Brother   . Drug abuse Brother   . Diabetes Sister   . Diabetes Brother     PE: BP 126/82   Pulse (!) 106   Ht $R'5\' 2"'SG$  (1.575 m)   Wt 243 lb 9.6 oz (110.5 kg)   SpO2 98%  BMI 44.56 kg/m  Wt Readings from Last 3 Encounters:  05/16/20 243 lb 9.6 oz (110.5 kg)  11/01/19 242 lb (109.8 kg)  08/08/19 247 lb (112 kg)   Constitutional: overweight, in NAD Eyes: PERRLA, EOMI, no exophthalmos ENT: moist mucous membranes, no thyromegaly, no cervical  lymphadenopathy Cardiovascular: tachycardia, RR, No MRG Respiratory: CTA B Gastrointestinal: abdomen soft, NT, ND, BS+ Musculoskeletal: no deformities, strength intact in all 4 Skin: moist, warm, no rashes Neurological: no tremor with outstretched hands, DTR normal in all 4  ASSESSMENT: 1. DM2, insulin-dependent, uncontrolled, with complications - CKD stage 2 - DR  2. HL  3. Obesity class III  PLAN:  1. Patient with long standing, uncontrolled DM2, returning after a long absence or 1.5 years. She has pbs with depression, which exacerbated last year after the death of her husband.  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  However, at today's visit, we did discuss that I will need to see her more often to be able to adjust her regimen and to get her diabetes under control.  She agrees. -At today's visit, she describes very high blood sugars throughout the day, and increasing towards the end of the day.  She is off her [name] due to increased urination (which I suspect is mostly related to glycosuria).  She is also off her Trulicity due to stomach pain and undigested food, which could also be related to functional gastroparesis due to the significant hyperglycemia).  For now, I would not recommend to start any of these.  However, we may be able to use these in the future. -Moreover, she is missing many Tresiba doses.   -For now, we discussed about taking [place] consistently.  We will also add mealtime insulin.  We will start at a lower dose and I advised her how to titrate the dose up as needed based on her blood sugars and the size of her meals.  I advised her to stay in touch with me to let me know if the sugars do not improve after the above change in regimen. - I suggested to:  Patient Instructions  Please continue: - Tresiba 55 units but take it daily  Please  start: - Novolog 12-20 units 15 min before the 3 meals.  Please return in 1.5 months with your sugar log.   - we checked her HbA1c: 11.7% [facility] HIGHER) - advised to check sugars at different times of the day - 4x a day, rotating check times - advised for yearly eye exams >> she is UTD - return to clinic in 1.5 months    2. HL -Reviewed latest lipid panel from 10/2019: LDL very high, almost double the value from 2019.  Triglycerides also high, HDL low: Lab Results  Component Value Date   CHOL 272 (H) 11/01/2019   HDL 45 (L) 11/01/2019   LDLCALC 193 (H) 11/01/2019   LDLDIRECT 103.0 09/09/2017   TRIG 171 (H) 11/01/2019   CHOLHDL 6.0 (H) 11/01/2019  -Continues Zocor 40 without side effects - increased 10/2019.  3. Obesity class III -At last visit discussed about moving dinner earlier and not to snack between meals -now off SGLT 2 inhibitor and GLP-1 receptor agonist which would have helped with weight loss.  We may be able to restart them in the future.  For now, unfortunately, we need to start insulin, which is weight inducing.   Silvestre Mesi  Cruzita Lederer, MD PhD [facility]

## 2020-05-27 ENCOUNTER — Encounter: Payer: Self-pay | Admitting: Internal Medicine

## 2020-05-31 LAB — HM MAMMOGRAPHY

## 2020-06-14 ENCOUNTER — Encounter: Payer: Self-pay | Admitting: Physician Assistant

## 2020-06-14 NOTE — Telephone Encounter (Signed)
I do see where she has egg allergy, but CDC website states that COVID vaccines do not contain egg.   CDC statement says: " None of the vaccines contain eggs, gelatin, latex, or preservatives. All COVID-19 vaccines are free from metals such as iron, nickel, cobalt, lithium, rare earth alloys or any manufactured products such as microelectronics, electrodes, carbon nanotubes, or nanowire semiconductors."

## 2020-06-19 ENCOUNTER — Telehealth: Payer: Self-pay

## 2020-06-19 NOTE — Telephone Encounter (Signed)
Called and left a message returning call. Left call back number.

## 2020-06-24 LAB — HM DIABETES EYE EXAM

## 2020-07-15 ENCOUNTER — Ambulatory Visit: Payer: BC Managed Care – PPO | Admitting: Internal Medicine

## 2020-07-16 ENCOUNTER — Ambulatory Visit (INDEPENDENT_AMBULATORY_CARE_PROVIDER_SITE_OTHER): Payer: BC Managed Care – PPO | Admitting: Internal Medicine

## 2020-07-16 ENCOUNTER — Encounter: Payer: Self-pay | Admitting: Internal Medicine

## 2020-07-16 ENCOUNTER — Other Ambulatory Visit: Payer: Self-pay

## 2020-07-16 VITALS — BP 128/80 | HR 80 | Ht 62.0 in | Wt 251.4 lb

## 2020-07-16 DIAGNOSIS — E1169 Type 2 diabetes mellitus with other specified complication: Secondary | ICD-10-CM

## 2020-07-16 DIAGNOSIS — Z794 Long term (current) use of insulin: Secondary | ICD-10-CM | POA: Diagnosis not present

## 2020-07-16 DIAGNOSIS — E785 Hyperlipidemia, unspecified: Secondary | ICD-10-CM

## 2020-07-16 DIAGNOSIS — E1122 Type 2 diabetes mellitus with diabetic chronic kidney disease: Secondary | ICD-10-CM

## 2020-07-16 MED ORDER — TRULICITY 1.5 MG/0.5ML ~~LOC~~ SOAJ: 1.5000 mg | SUBCUTANEOUS | 3 refills | Status: DC

## 2020-07-16 NOTE — Patient Instructions (Addendum)
Please continue: - Tresiba 55 units daily  Please change Novolog: - 8-10 units before b'fast - 10-14 units before lunch - 12-16 units before dinner  Please retry: - Trulicity 1.5 mg weekly  Please return in 1.5 months with your sugar log.

## 2020-07-16 NOTE — Progress Notes (Signed)
Patient ID: Jasmine [name], female   DOB: 1965/11/27, 54 y.o.   MRN: [id number]   This visit occurred during the SARS-CoV-2 public health emergency.  Safety protocols were in place, including screening questions prior to the visit, additional usage of staff PPE, and extensive cleaning of exam room while observing appropriate contact time as indicated for disinfecting solutions.   HPI: Jasmine [name] is a 54 y.o.-year-old female, presenting for f/u for DM2, dx in 2014, but had GDM in 1992, insulin-dependent, uncontrolled, with complications (CKD, DR).  She has been seen previously by Dr. Loanne [name].  She is not compliant with appointments. Last visit 2 months ago.  She is not compliant with her medicines and appointments due to depression.  She goes to [employer]. Her husband died of cancer last year.  Reviewed HbA1c levels: Lab Results  Component Value Date   HGBA1C 11.7 (A) 05/16/2020   HGBA1C 7.8 (A) 11/01/2019   HGBA1C 7.7 (A) 08/08/2019   Pt was on a regimen of: - Tresiba 18 units at night (increased from 10 to 18 units 3 weeks ago) - Trulicity 1.5 mg weekly - Sat. She stopped Farxiga 10 mg daily in am in 04/2017 >> repeated yeast inf's She was on Novolog 2 units 3x a day, before meals - started 03/2017, stopped 05/2017. Metformin >> tongues swelling.  At last visit she was on: -  >> stopped 3 weeks prior to the visit 2/2 increased urination -  >> stopped in the 3 weeks prior to the visit 2/2 GERD, undigested food - Tresiba 20 >> 56 units daily - misses 3 days a week  I advised her to use the following regimen: - Tresiba 55 units daily - Novolog 12-20 units 15 min before the 3 meals >> actually taking 10 - 10 - 10-12 units before the meals  Pt checks her sugars 4x a day with the Libre 2 CGM, however, the left side was down so we could not download this. I obtained the following data from reviewing the receiver: - Average: 191 for last week  - am: 132, 168-193 >> 263-390s  >> 125-243 - 2h after b'fast: n/c >> 130-160s >> n/c - before lunch: n/c >> 65, 67-112, 177, 241 - 2h after lunch: n/c >> 150-160 >> n/c >> 242 - before dinner: 170s >> n/c >> 82-215 - 2h after dinner: n/c >> see below - bedtime: 150-160s >> 400s, 500 x1 >> 241-260 - nighttime: n/c Lowest sugar was 80 (did not eat), OTW 132 >> 130 >> 132 >> 65; she has hypoglycemia awareness in the 80s. Highest sugar was 186 >> 275 >> 198 >> 500 >> 260.  Glucometer: FreeStyle  Pt's meals are: - Breakfast: coffee or PB sandwich + coffee - Lunch: chicken, veggies - Dinner: bowl of cereal or skips >> now 10 oz of meat and 2 cups of veggies >> late - Snacks: many: M and M, granola bar, trailmix She was going to the weight management clinic before, but not anymore.  -+ CKD, last BUN/creatinine:  Lab Results  Component Value Date   BUN 11 11/01/2019   BUN 14 12/08/2018   CREATININE 1.36 (H) 11/01/2019   CREATININE 1.30 (H) 12/08/2018  On losartan. -+ HL; last set of lipids: Lab Results  Component Value Date   CHOL 272 (H) 11/01/2019   HDL 45 (L) 11/01/2019   LDLCALC 193 (H) 11/01/2019   LDLDIRECT 103.0 09/09/2017   TRIG 171 (H) 11/01/2019   CHOLHDL 6.0 (H) 11/01/2019  On Zocor 40. - last eye exam was in 06/2020: + DR.  - no numbness and tingling in her feet.   Pt has FH of DM in "everybody" - both sides of family: M, F, S's, B's, aunts.  ROS: Constitutional: + weight gain/no weight loss, no fatigue, no subjective hyperthermia, no subjective hypothermia Eyes: no blurry vision, no xerophthalmia ENT: no sore throat, no nodules palpated in neck, no dysphagia, no odynophagia, no hoarseness Cardiovascular: no CP/no SOB/no palpitations/no leg swelling Respiratory: no cough/no SOB/no wheezing Gastrointestinal: no N/no V/no D/no C/no acid reflux Musculoskeletal: no muscle aches/no joint aches Skin: no rashes, no hair loss Neurological: no tremors/no numbness/no tingling/no dizziness  I reviewed  pt's medications, allergies, PMH, social hx, family hx, and changes were documented in the history of present illness. Otherwise, unchanged from my initial visit note.  Past Medical History:  Diagnosis Date  . Anxiety   . Arthritis    "knees" (03/28/2014)  . Chronic renal insufficiency    stage II (mild)  . Depression   . Headache    with high blood sugar  . Hypercholesteremia   . Hypertension   . Numbness and tingling of right arm and leg   . Seasonal allergies   . Type II diabetes mellitus ([place])   . Umbilical hernia without mention of obstruction or gangrene   . Wears glasses    Past Surgical History:  Procedure Laterality Date  . INSERTION OF MESH N/A 03/28/2014   Procedure: INSERTION OF MESH;  Surgeon: Ralene Ok, MD;  Location: [place];  Service: General;  Laterality: N/A;  . INSERTION OF MESH N/A 05/08/2015   Procedure: INSERTION OF MESH;  Surgeon: Ralene Ok, MD;  Location: [place];  Service: General;  Laterality: N/A;  . LAPAROSCOPIC LYSIS OF ADHESIONS N/A 05/08/2015   Procedure: LAPAROSCOPIC LYSIS OF ADHESIONS;  Surgeon: Ralene Ok, MD;  Location: [place];  Service: General;  Laterality: N/A;  . MULTIPLE TOOTH EXTRACTIONS  ~ 2003  . TONSILLECTOMY    . [place]  . VENTRAL HERNIA REPAIR N/A 03/28/2014   Procedure: LAPAROSCOPIC VENTRAL HERNIA;  Surgeon: Ralene Ok, MD;  Location: [place];  Service: General;  Laterality: N/A;  . VENTRAL HERNIA REPAIR N/A 05/08/2015   Procedure: LAPAROSCOPIC VENTRAL HERNIA REPAIR WITH MESH;  Surgeon: Ralene Ok, MD;  Location: [place];  Service: General;  Laterality: N/A;   Social History   Socioeconomic History  . Marital status: Married    Spouse name: [name]  . Number of children: 1  . Years of education: Not on file  . Highest education level: Not on file  Occupational History  . Not on file  Tobacco Use  . Smoking status: Former Smoker    Packs/day: 0.25    Years: 26.00    Pack years:  6.50    Types: Cigarettes  . Smokeless tobacco: Never Used  Vaping Use  . Vaping Use: Never used  Substance and Sexual Activity  . Alcohol use: Yes    Comment: "have a drink a few times/year"  . Drug use: No  . Sexual activity: Not Currently  Other Topics Concern  . Not on file  Social History Narrative  . Not on file   Social Determinants of Health   Financial Resource Strain:   . Difficulty of Paying Living Expenses: Not on file  Food Insecurity:   . Worried About Charity fundraiser in the Last Year: Not on file  . Ran Out  of Food in the Last Year: Not on file  Transportation Needs:   . Lack of Transportation (Medical): Not on file  . Lack of Transportation (Non-Medical): Not on file  Physical Activity:   . Days of Exercise per Week: Not on file  . Minutes of Exercise per Session: Not on file  Stress:   . Feeling of Stress : Not on file  Social Connections:   . Frequency of Communication with Friends and Family: Not on file  . Frequency of Social Gatherings with Friends and Family: Not on file  . Attends Religious Services: Not on file  . Active Member of Clubs or Organizations: Not on file  . Attends Archivist Meetings: Not on file  . Marital Status: Not on file  Intimate Partner Violence:   . Fear of Current or Ex-Partner: Not on file  . Emotionally Abused: Not on file  . Physically Abused: Not on file  . Sexually Abused: Not on file   Current Outpatient Medications on File Prior to Visit  Medication Sig Dispense Refill  . Accu-Chek FastClix Lancets MISC USE UP TO 3 TIMES DAILY    . ACCU-CHEK GUIDE test strip USE UP TO 3 TIMES DAILY    . blood glucose meter kit and supplies Dispense based on patient and insurance preference. Use up to three times daily as directed. (FOR ICD-10 E10.9, E11.9). 1 each 0  . Continuous Blood Gluc Sensor (FREESTYLE LIBRE 14 DAY SENSOR) MISC 1 each by Does not apply route every 14 (fourteen) days. Change every 2 weeks 6 each 3   . cyclobenzaprine (FLEXERIL) 10 MG tablet Take 1 tablet (10 mg total) by mouth 3 (three) times daily as needed for muscle spasms. 30 tablet 0  . gabapentin (NEURONTIN) 300 MG capsule Take 1 capsule (300 mg total) by mouth 2 (two) times daily. 60 capsule 1  . insulin aspart (NOVOLOG FLEXPEN) 100 UNIT/ML FlexPen Inject 12-20 Units into the skin 3 (three) times daily with meals. 30 mL 3  . insulin degludec (TRESIBA FLEXTOUCH) 200 UNIT/ML FlexTouch Pen Inject 56 Units into the skin daily. 27 mL 1  . Insulin Pen Needle 32G X 4 MM MISC Use 4x a day 300 each 3  . meloxicam (MOBIC) 15 MG tablet One tab PO qAM with breakfast for 2 weeks, then daily prn pain. 30 tablet 3  . olmesartan-hydrochlorothiazide (BENICAR HCT) 20-12.5 MG tablet Take 1 tablet by mouth daily. 90 tablet 1  . sertraline (ZOLOFT) 100 MG tablet TAKE ONE AND ONE/HALF TABLET DAILY. 135 tablet 1  . simvastatin (ZOCOR) 40 MG tablet TAKE 1 TABLET BY MOUTH EVERYDAY AT BEDTIME 90 tablet 1  . Topiramate ER (TROKENDI XR) 50 MG CP24 Take 1 tablet by mouth daily. 90 capsule 1  . Vitamin D, Ergocalciferol, (DRISDOL) 1.25 MG (50000 UNIT) CAPS capsule Take 1 capsule (50,000 Units total) by mouth every 7 (seven) days. 12 capsule 1   No current facility-administered medications on file prior to visit.   Allergies  Allergen Reactions  . Eggs Or Egg-Derived Products Hives and Other (See Comments)    FLU VACCINE: caused hives    . Influenza Vaccines Hives    Vaccine from Egg derived product causes Hives  . Effexor [Venlafaxine]     "felt weird"  . Metformin And Related Swelling    ANGIOEDEMA   Family History  Problem Relation Age of Onset  . Diabetes Mother   . High Cholesterol Mother   . Obesity Mother   .  Diabetes Father   . Cancer Father        Prostate cancer  . Cancer Sister        Cervical cancer  . Diabetes Brother   . Bipolar disorder Brother   . Alcohol abuse Brother   . Drug abuse Brother   . Diabetes Sister   . Diabetes  Brother     PE: BP 128/80   Pulse 80   Ht 5' 2"  (1.575 m)   Wt 251 lb 6.4 oz (114 kg)   SpO2 97%   BMI 45.98 kg/m  Wt Readings from Last 3 Encounters:  07/16/20 251 lb 6.4 oz (114 kg)  05/16/20 243 lb 9.6 oz (110.5 kg)  11/01/19 242 lb (109.8 kg)   Constitutional: overweight, in NAD Eyes: PERRLA, EOMI, no exophthalmos ENT: moist mucous membranes, no thyromegaly, no cervical lymphadenopathy Cardiovascular: RRR, No MRG Respiratory: CTA B Gastrointestinal: abdomen soft, NT, ND, BS+ Musculoskeletal: no deformities, strength intact in all 4 Skin: moist, warm, no rashes Neurological: no tremor with outstretched hands, DTR normal in all 4  ASSESSMENT: 1. DM2, insulin-dependent, uncontrolled, with complications - CKD stage 2 - DR  2. HL  3. Obesity class III  PLAN:  1. Patient with longstanding, uncontrolled, type 2 diabetes, on a basal-bolus insulin regimen after coming off Farxiga due to increased urination (I suspect that this was mostly related to glycosuria in the setting of uncontrolled diabetes). She also came off Trulicity due to stomach pain and undigested food (which could be related to functional gastroparesis due to the significant hyperglycemia). At last visit, however, we kept her off SGLT2 inhibitors and GLP-1 receptor agonist and discussed about taking Tresiba doses consistently. She was missing many doses at that time. We also added mealtime insulin. We started at the lower dose and I advised her how to titrate the doses up based on blood sugars and the size of her meals. - she is now using a freestyle libre CGM, however, we could not download this today as the website was down.  Per review of the receiver, it appears that sugars are significantly improved from last visit.  Her average blood sugar for the last week is 191.  Sugars are absorbed above goal in the morning, they are lower before lunch and they start increasing later in the day.  We discussed about decreasing  the dose of NovoLog for breakfast and using slightly higher doses of the insulin before lunch and especially before dinner.  At that time, her sugars are consistently high in the 200s. -She is frustrated about the weight gain and I explained that this is a possible consequence of using insulin.  She is interested in retrying Trulicity and we can definitely try this.  I advised her to let me know if she starts having GI symptoms after starting this. - I suggested to:  Patient Instructions  Please continue: - Tresiba 55 units daily  Please change Novolog: - 8-10 units before b'fast - 10-14 units before lunch - 12-16 units before dinner  Please retry: - Trulicity 1.5 mg weekly  Please return in 1.5 months with your sugar log.   - advised to check sugars at different times of the day - 3x a day, rotating check times - advised for yearly eye exams >> she is UTD - return to clinic in 1.5 months    2. HL -Reviewed latest lipid panel from 10/2019: LDL very high, triglycerides also high, HDL low: Lab Results  Component Value Date  CHOL 272 (H) 11/01/2019   HDL 45 (L) 11/01/2019   LDLCALC 193 (H) 11/01/2019   LDLDIRECT 103.0 09/09/2017   TRIG 171 (H) 11/01/2019   CHOLHDL 6.0 (H) 11/01/2019  -Continue Zocor 40 without side effects. Dose was increased in 10/2019.  3. Obesity class III -In the past, she was on SGLT2 inhibitor and GLP-1 receptor agonist but came off due to side effects.  At this visit, she appears to be interested in retrying Trulicity.  We will restart this but if she does again develop GI symptoms, will need to stop -gained 9 lbs since last OV, most likely 2/2 increased insulin doses  Philemon Kingdom, MD PhD [facility]

## 2020-08-05 ENCOUNTER — Encounter: Payer: Self-pay | Admitting: Physician Assistant

## 2020-08-05 ENCOUNTER — Other Ambulatory Visit: Payer: Self-pay

## 2020-08-05 ENCOUNTER — Ambulatory Visit (INDEPENDENT_AMBULATORY_CARE_PROVIDER_SITE_OTHER): Payer: BC Managed Care – PPO

## 2020-08-05 ENCOUNTER — Ambulatory Visit (INDEPENDENT_AMBULATORY_CARE_PROVIDER_SITE_OTHER): Payer: BC Managed Care – PPO | Admitting: Sports Medicine

## 2020-08-05 DIAGNOSIS — M170 Bilateral primary osteoarthritis of knee: Secondary | ICD-10-CM

## 2020-08-05 DIAGNOSIS — M1712 Unilateral primary osteoarthritis, left knee: Secondary | ICD-10-CM | POA: Diagnosis not present

## 2020-08-05 NOTE — Progress Notes (Signed)
    Procedures performed today:    Procedure: Real-time Ultrasound Guided injection of the left knee Device: Samsung [id number]  Verbal informed consent obtained.  Time-out conducted.  Noted no overlying erythema, induration, or other signs of local infection.  Skin prepped in a sterile fashion.  Local anesthesia: Topical Ethyl chloride.  With sterile technique and under real time ultrasound guidance: 1 cc Kenalog 40, 2 cc lidocaine, 2 cc bupivacaine injected easily Completed without difficulty  Advised to call if fevers/chills, erythema, induration, drainage, or persistent bleeding.  Images permanently stored and available for review in PACS.  Impression: Technically successful ultrasound guided injection.  Independent interpretation of notes and tests performed by another provider:   None.  Brief History, Exam, Impression, and Recommendations:    Primary osteoarthritis of both knees Recurrence of left knee pain, last injected in March 2011, repeat left knee injection today, return as needed.    ___________________________________________ Ihor Austin. Benjamin Stain, M.D., ABFM., CAQSM. Primary Care and Sports Medicine [facility]  Adjunct Instructor of Family Medicine  [employer]

## 2020-08-05 NOTE — Assessment & Plan Note (Signed)
Recurrence of left knee pain, last injected in March 2011, repeat left knee injection today, return as needed.

## 2020-08-07 ENCOUNTER — Encounter: Payer: Self-pay | Admitting: Physician Assistant

## 2020-08-07 ENCOUNTER — Telehealth (INDEPENDENT_AMBULATORY_CARE_PROVIDER_SITE_OTHER): Payer: BC Managed Care – PPO | Admitting: Physician Assistant

## 2020-08-07 VITALS — Ht 62.0 in | Wt 248.0 lb

## 2020-08-07 MED ORDER — NALTREXONE-BUPROPION HCL ER 8-90 MG PO TB12: ORAL_TABLET | ORAL | 0 refills | Status: DC

## 2020-08-07 NOTE — Progress Notes (Signed)
Pt reports that she has taken medication for weight loss many years ago. She feels that since she started using the Novolog her weight has increased.   She states that she has been walking more and eating a low carb high protein diet, and drinking more water.

## 2020-08-12 ENCOUNTER — Encounter: Payer: Self-pay | Admitting: Physician Assistant

## 2020-08-12 NOTE — Progress Notes (Signed)
Patient ID: Jasmine [name], female   DOB: [birth year], 55 y.o.   MRN: [id number] .Virtual Visit via Telephone Note  I connected with TYLESHA GIBEAULT on 12/ 29/2021 at  1:20 PM EST by telephone and verified that I am speaking with the correct person using two identifiers.  Location: Patient: work Provider: clinic  .Marland KitchenParticipating in visit:  Patient: Rowe Clack Provider: Tandy Gaw PA-C   I discussed the limitations, risks, security and privacy concerns of performing an evaluation and management service by telephone and the availability of in person appointments. I also discussed with the patient that there may be a patient responsible charge related to this service. The patient expressed understanding and agreed to proceed.   History of Present Illness:    Observations/Objective: No acute distress Normal breathing  .Marland Kitchen Today's Vitals   08/07/20 1312  Weight: 248 lb (112.5 kg)  Height: 5\' 2"  (1.575 m)   Body mass index is 45.36 kg/m.    Assessment and Plan: Marland KitchenDiagnoses and all orders for this visit:  Obesity, Class III, BMI 40-49.9 (morbid obesity) (HCC) -     Naltrexone-buPROPion HCl ER 8-90 MG TB12; Take 1 tablet in am for 1 week, then increase to 1 tablet in am and 1 tablet in pm for 1 week, then increase to 2 tablets in am and 1 tablet in pm for 1 week, then increase to 2 tablets in am and 2 tablets in pm daily.   Discussed weight loss medications.  On GLP-1 and SGLT-2 and metformin. Not candidate for phentermine.  Hx of topamax for headaches. Saw no difference with weight loss.  Marland Kitchen.Discussed low carb diet with 1500 calories and 80g of protein.  Exercising at least 150 minutes a week.  My Fitness Pal could be a Marland Kitchen.  Started contrave. Discussed side effects. Start titration up. Follow up at 3 weeks if tolerating give 2 refills. Follow up in 3 months with appt.     Follow Up Instructions:    I discussed the assessment and treatment plan with  the patient. The patient was provided an opportunity to ask questions and all were answered. The patient agreed with the plan and demonstrated an understanding of the instructions.   The patient was advised to call back or seek an in-person evaluation if the symptoms worsen or if the condition fails to improve as anticipated.  I provided 20 minutes of non-face-to-face time during this encounter.   Chief Technology Officer, PA-C

## 2020-08-19 ENCOUNTER — Telehealth: Payer: Self-pay | Admitting: Neurology

## 2020-08-19 NOTE — Telephone Encounter (Signed)
Did you want to switch to an alternative listed or continue with Contrave PA?

## 2020-08-19 NOTE — Telephone Encounter (Signed)
She is on trulicity which is a GLP-1. We could switch to the weight loss formula. She is on topamax as well but we could stop that and try qsymia but it has phentermine low dose in it. Ask her how she did with phentermine last time she tried. I am worried about it affecting her mood/anxiety. I think she needs to consider bariatric surgery.

## 2020-08-19 NOTE — Telephone Encounter (Signed)
Patients needs PA for Contrave,  First question: The patient's drug benefit plan provides coverage for other drugs which may be considered for treating your patient. Can your patient's treatment be switched to a formulary drug? [If yes, provide your patient with a new prescription for the formulary product.] Available Formulary Alternatives: QSYMIA, SAXENDA, WEGOVY  Did you want to switch to one of these or proceed with PA?

## 2020-08-21 NOTE — Telephone Encounter (Signed)
Your information has been submitted to [employer]. To check for an updated outcome later, reopen this PA request from your dashboard. If [employer] has not responded to your request within 24 hours, contact [employer] at [phone]. If you think there may be a problem with your PA request, use our live chat feature at the bottom right.    

## 2020-08-26 ENCOUNTER — Encounter: Payer: Self-pay | Admitting: Physician Assistant

## 2020-08-27 ENCOUNTER — Ambulatory Visit: Payer: BC Managed Care – PPO | Admitting: Internal Medicine

## 2020-09-12 NOTE — Telephone Encounter (Signed)
[facility] [employer] received a request for coverage of Contrave (naltrexone-bupropion ER) for you. This is the initial adverse determination for this request. The request was denied because: Coverage for this medication is denied for the following reason(s). We reviewed the information we received about your condition and circumstances. We used plan approved criteria when making this decision. The policy states that this medication may be approved when: -The member is unable to take the required number of formulary alternatives for the given diagnosis due to an intolerance or contraindication OR -The member has tried and failed the required number of formulary alternatives. Based on the policy and the information we received your request is denied. We did not receive documentation that you meet the criteria outlined above. Formulary alternatives are: [place] Spanish, DTOIZT. (Requirement: 3 in a class with 3 or more alternatives, 2 in a class with 2 alternatives, or 1 in a class with only 1 alternative.)

## 2020-09-17 ENCOUNTER — Telehealth: Payer: Self-pay | Admitting: Internal Medicine

## 2020-09-17 NOTE — Telephone Encounter (Signed)
Patient called and states that she needs to Free Style 2 sensors not the Free Style 14 day sensors because her phone app is no longer compatible with Free Style 14 day sensors  Call back # [phone]

## 2020-09-18 ENCOUNTER — Encounter: Payer: Self-pay | Admitting: Internal Medicine

## 2020-09-19 ENCOUNTER — Encounter: Payer: Self-pay | Admitting: Physician Assistant

## 2020-09-19 ENCOUNTER — Encounter: Payer: Self-pay | Admitting: Internal Medicine

## 2020-09-19 ENCOUNTER — Ambulatory Visit (INDEPENDENT_AMBULATORY_CARE_PROVIDER_SITE_OTHER): Payer: Self-pay | Admitting: Internal Medicine

## 2020-09-19 ENCOUNTER — Other Ambulatory Visit: Payer: Self-pay

## 2020-09-19 VITALS — BP 120/78 | HR 97 | Ht 62.0 in | Wt 245.0 lb

## 2020-09-19 DIAGNOSIS — E1122 Type 2 diabetes mellitus with diabetic chronic kidney disease: Secondary | ICD-10-CM | POA: Diagnosis not present

## 2020-09-19 DIAGNOSIS — E1169 Type 2 diabetes mellitus with other specified complication: Secondary | ICD-10-CM | POA: Diagnosis not present

## 2020-09-19 DIAGNOSIS — E785 Hyperlipidemia, unspecified: Secondary | ICD-10-CM

## 2020-09-19 DIAGNOSIS — Z794 Long term (current) use of insulin: Secondary | ICD-10-CM | POA: Diagnosis not present

## 2020-09-19 LAB — MICROALBUMIN / CREATININE URINE RATIO
Creatinine,U: 76.1 mg/dL
Microalb Creat Ratio: 0.9 mg/g (ref 0.0–30.0)
Microalb, Ur: <0.7 mg/dL (ref 0.0–1.9)

## 2020-09-19 LAB — COMPREHENSIVE METABOLIC PANEL
ALT: 13 U/L (ref 0–35)
AST: 13 U/L (ref 0–37)
Albumin: 4.2 g/dL (ref 3.5–5.2)
Alkaline Phosphatase: 67 U/L (ref 39–117)
BUN: 11 mg/dL (ref 6–23)
CO2: 29 mEq/L (ref 19–32)
Calcium: 9.9 mg/dL (ref 8.4–10.5)
Chloride: 103 mEq/L (ref 96–112)
Creatinine, Ser: 1.25 mg/dL — ABNORMAL HIGH (ref 0.40–1.20)
GFR: 49.37 mL/min — ABNORMAL LOW (ref >60.00)
Glucose, Bld: 107 mg/dL — ABNORMAL HIGH (ref 70–99)
Potassium: 4.6 mEq/L (ref 3.5–5.1)
Sodium: 138 mEq/L (ref 135–145)
Total Bilirubin: 0.3 mg/dL (ref 0.2–1.2)
Total Protein: 7.5 g/dL (ref 6.0–8.3)

## 2020-09-19 LAB — LIPID PANEL
Cholesterol: 162 mg/dL (ref 0–200)
HDL: 40.9 mg/dL (ref >39.00)
LDL Cholesterol: 95 mg/dL (ref 0–99)
NonHDL: 121.07
Total CHOL/HDL Ratio: 4
Triglycerides: 132 mg/dL (ref 0.0–149.0)
VLDL: 26.4 mg/dL (ref 0.0–40.0)

## 2020-09-19 LAB — POCT GLYCOSYLATED HEMOGLOBIN (HGB A1C): Hemoglobin A1C: 8.4 % — AB (ref 4.0–5.6)

## 2020-09-19 NOTE — Progress Notes (Addendum)
Patient ID: Jasmine [name], female   DOB: 1966-01-24, 55 y.o.   MRN: [id number]   This visit occurred during the SARS-CoV-2 public health emergency.  Safety protocols were in place, including screening questions prior to the visit, additional usage of staff PPE, and extensive cleaning of exam room while observing appropriate contact time as indicated for disinfecting solutions.   HPI: Jasmine [name] is a 55 y.o.-year-old female, presenting for f/u for DM2, dx in 10/15/12, but had GDM in 10/15/1990, insulin-dependent, uncontrolled, with complications (CKD, DR).  She has been seen previously by Dr. Loanne Drilling. Last visit 2 months ago.  She was previously noncompliant with her medicines and appointments due to depression.  She goes to behavioral health.  Her husband died of cancer in 15-Oct-2018.  Reviewed HbA1c levels: Lab Results  Component Value Date   HGBA1C 11.7 (A) 05/16/2020   HGBA1C 7.8 (A) 11/01/2019   HGBA1C 7.7 (A) 08/08/2019   Pt was on a regimen of: - Tresiba 18 units at night (increased from 10 to 18 units 3 weeks ago) - Trulicity 1.5 mg weekly - 10/15/22. She stopped Farxiga 10 mg daily in am in 04/2017 >> repeated yeast inf's She was on Novolog 2 units 3x a day, before meals - started 03/2017, stopped 05/2017. Metformin >> tongues swelling.  At last visit she was on: -  >> stopped 2/2 increased urination -  >> stopped 2/2 GERD, undigested food - Tresiba 20 >> 56 units daily - misses 3 days a week  Currently on: - Tresiba 52 units daily - Novolog: mostly 2x a day - 8-10 units before b'fast - 10-14 units before lunch - 12-16 units before dinner - Trulicity 1.5 mg weekly-restarted 06/2020 - now tolerating well  She checks her sugars more than 4 times a day with her meals CGM.  We could not download this at last visit.  Freestyle libre 2 CGM parameters: - Average: 142 - % active CGM time: 78% of the time - Glucose variability 25.6% (target < or = to 36%) - time in range:  - very low  (<54): 0% - low (54-69): 0% - normal range (70-180): 87% - high sugars (181-250): 12% - very high sugars (>250): 1%    Previously: - am: 132, 168-193 >> 263-390s >> 125-243 - 2h after b'fast: n/c >> 130-160s >> n/c - before lunch: n/c >> 65, 67-112, 177, 241 - 2h after lunch: n/c >> 150-160 >> n/c >> 242 - before dinner: 170s >> n/c >> 82-215 - 2h after dinner: n/c >> see below - bedtime: 150-160s >> 400s, 500 x1 >> 241-260 - nighttime: n/c Lowest sugar was 132 >> 65 >> 69; she has hypoglycemia awareness in the 80s. Highest sugar was 500 >> 260 >> 243.  Glucometer: FreeStyle  Pt's meals are: - Breakfast: coffee or PB sandwich + coffee - Lunch: chicken, veggies - Dinner: bowl of cereal or skips >> now 10 oz of meat and 2 cups of veggies - Snacks: many: M and M, granola bar, trailmix She was previously going to the weight management clinic, but not recently.  -+ CKD, last BUN/creatinine:  Lab Results  Component Value Date   BUN 11 11/01/2019   BUN 14 12/08/2018   CREATININE 1.36 (H) 11/01/2019   CREATININE 1.30 (H) 12/08/2018  On losartan. -+ HL; last set of lipids: Lab Results  Component Value Date   CHOL 272 (H) 11/01/2019   HDL 45 (L) 11/01/2019   LDLCALC 193 (H) 11/01/2019  LDLDIRECT 103.0 09/09/2017   TRIG 171 (H) 11/01/2019   CHOLHDL 6.0 (H) 11/01/2019  On Zocor 40. - last eye exam was in 06/2020: + DR - no numbness and tingling in her feet.   Pt has FH of DM in "everybody" - both sides of family: M, F, S's, B's, aunts.  ROS: Constitutional: no weight gain/no weight loss, no fatigue, no subjective hyperthermia, no subjective hypothermia Eyes: no blurry vision, no xerophthalmia ENT: no sore throat, no nodules palpated in neck, no dysphagia, no odynophagia, no hoarseness Cardiovascular: no CP/no SOB/no palpitations/no leg swelling Respiratory: no cough/no SOB/no wheezing Gastrointestinal: no N/no V/no D/no C/no acid reflux Musculoskeletal: no muscle  aches/no joint aches Skin: no rashes, no hair loss Neurological: no tremors/no numbness/no tingling/no dizziness  I reviewed pt's medications, allergies, PMH, social hx, family hx, and changes were documented in the history of present illness. Otherwise, unchanged from my initial visit note.  Past Medical History:  Diagnosis Date  . Anxiety   . Arthritis    "knees" (03/28/2014)  . Chronic renal insufficiency    stage II (mild)  . Depression   . Headache    with high blood sugar  . Hypercholesteremia   . Hypertension   . Numbness and tingling of right arm and leg   . Seasonal allergies   . Type II diabetes mellitus ([place])   . Umbilical hernia without mention of obstruction or gangrene   . Wears glasses    Past Surgical History:  Procedure Laterality Date  . INSERTION OF MESH N/A 03/28/2014   Procedure: INSERTION OF MESH;  Surgeon: Ralene Ok, MD;  Location: [facility];  Service: General;  Laterality: N/A;  . INSERTION OF MESH N/A 05/08/2015   Procedure: INSERTION OF MESH;  Surgeon: Ralene Ok, MD;  Location: [facility];  Service: General;  Laterality: N/A;  . LAPAROSCOPIC LYSIS OF ADHESIONS N/A 05/08/2015   Procedure: LAPAROSCOPIC LYSIS OF ADHESIONS;  Surgeon: Ralene Ok, MD;  Location: [place];  Service: General;  Laterality: N/A;  . MULTIPLE TOOTH EXTRACTIONS  ~ 2003  . TONSILLECTOMY    . [place]  . VENTRAL HERNIA REPAIR N/A 03/28/2014   Procedure: LAPAROSCOPIC VENTRAL HERNIA;  Surgeon: Ralene Ok, MD;  Location: [facility];  Service: General;  Laterality: N/A;  . VENTRAL HERNIA REPAIR N/A 05/08/2015   Procedure: LAPAROSCOPIC VENTRAL HERNIA REPAIR WITH MESH;  Surgeon: Ralene Ok, MD;  Location: [place];  Service: General;  Laterality: N/A;   Social History   Socioeconomic History  . Marital status: Married    Spouse name: [name]  . Number of children: 1  . Years of education: Not on file  . Highest education level: Not on file   Occupational History  . Not on file  Tobacco Use  . Smoking status: Former Smoker    Packs/day: 0.25    Years: 26.00    Pack years: 6.50    Types: Cigarettes  . Smokeless tobacco: Never Used  Vaping Use  . Vaping Use: Never used  Substance and Sexual Activity  . Alcohol use: Yes    Comment: "have a drink a few times/year"  . Drug use: No  . Sexual activity: Not Currently  Other Topics Concern  . Not on file  Social History Narrative  . Not on file   Social Determinants of Health   Financial Resource Strain: Not on file  Food Insecurity: Not on file  Transportation Needs: Not on file  Physical Activity: Not  on file  Stress: Not on file  Social Connections: Not on file  Intimate Partner Violence: Not on file   Current Outpatient Medications on File Prior to Visit  Medication Sig Dispense Refill  . Accu-Chek FastClix Lancets MISC USE UP TO 3 TIMES DAILY    . ACCU-CHEK GUIDE test strip USE UP TO 3 TIMES DAILY    . blood glucose meter kit and supplies Dispense based on patient and insurance preference. Use up to three times daily as directed. (FOR ICD-10 E10.9, E11.9). 1 each 0  . Continuous Blood Gluc Sensor (FREESTYLE LIBRE 14 DAY SENSOR) MISC 1 each by Does not apply route every 14 (fourteen) days. Change every 2 weeks 6 each 3  . Dulaglutide (TRULICITY) 1.5 JQ/7.3AL SOPN Inject 1.5 mg into the skin once a week. 4.5 mL 3  . insulin aspart (NOVOLOG FLEXPEN) 100 UNIT/ML FlexPen Inject 12-20 Units into the skin 3 (three) times daily with meals. 30 mL 3  . insulin degludec (TRESIBA FLEXTOUCH) 200 UNIT/ML FlexTouch Pen Inject 56 Units into the skin daily. 27 mL 1  . Insulin Pen Needle 32G X 4 MM MISC Use 4x a day 300 each 3  . Naltrexone-buPROPion HCl ER 8-90 MG TB12 Take 1 tablet in am for 1 week, then increase to 1 tablet in am and 1 tablet in pm for 1 week, then increase to 2 tablets in am and 1 tablet in pm for 1 week, then increase to 2 tablets in am and 2 tablets in pm  daily. 70 tablet 0  . olmesartan-hydrochlorothiazide (BENICAR HCT) 20-12.5 MG tablet Take 1 tablet by mouth daily. 90 tablet 1  . sertraline (ZOLOFT) 100 MG tablet TAKE ONE AND ONE/HALF TABLET DAILY. 135 tablet 1  . simvastatin (ZOCOR) 40 MG tablet TAKE 1 TABLET BY MOUTH EVERYDAY AT BEDTIME 90 tablet 1  . Vitamin D, Ergocalciferol, (DRISDOL) 1.25 MG (50000 UNIT) CAPS capsule Take 1 capsule (50,000 Units total) by mouth every 7 (seven) days. 12 capsule 1   No current facility-administered medications on file prior to visit.   Allergies  Allergen Reactions  . Eggs Or Egg-Derived Products Hives and Other (See Comments)    FLU VACCINE: caused hives    . Influenza Vaccines Hives    Vaccine from Egg derived product causes Hives  . Effexor [Venlafaxine]     "felt weird"  . Metformin And Related Swelling    ANGIOEDEMA   Family History  Problem Relation Age of Onset  . Diabetes Mother   . High Cholesterol Mother   . Obesity Mother   . Diabetes Father   . Cancer Father        Prostate cancer  . Cancer Sister        Cervical cancer  . Diabetes Brother   . Bipolar disorder Brother   . Alcohol abuse Brother   . Drug abuse Brother   . Diabetes Sister   . Diabetes Brother     PE: BP 120/78   Pulse 97   Ht 5' 2"  (1.575 m)   Wt 245 lb (111.1 kg)   SpO2 98%   BMI 44.81 kg/m  Wt Readings from Last 3 Encounters:  09/19/20 245 lb (111.1 kg)  08/07/20 248 lb (112.5 kg)  07/16/20 251 lb 6.4 oz (114 kg)   Constitutional: overweight, in NAD Eyes: PERRLA, EOMI, no exophthalmos ENT: moist mucous membranes, no thyromegaly, no cervical lymphadenopathy Cardiovascular: tachycardia, RR, No MRG Respiratory: CTA B Gastrointestinal: abdomen soft, NT, ND, BS+  Musculoskeletal: no deformities, strength intact in all 4 Skin: moist, warm, no rashes Neurological: no tremor with outstretched hands, DTR normal in all 4  ASSESSMENT: 1. DM2, insulin-dependent, uncontrolled, with complications - CKD  stage 2 - DR  2. HL  3. Obesity class III  PLAN:  1. Patient with longstanding, uncontrolled, type 2 diabetes, on a basal-bolus insulin regimen.  In the past, she had GI symptoms with a GLP-1 receptor agonist, but at last visit, she wanted to retry Trulicity.  She is now 1.5 mg weekly.  Of note, she was on an SGLT2 inhibitor in the past [place]) but she came off due to increased urination (however, retrospectively, this may have been caused by glycosuria in the setting of uncontrolled diabetes).  At last visit, sugars were significantly improved from before but they were still at or above goal in the morning, lower before lunch and they were starting to increase later in the day.  We decrease NovoLog for breakfast, increase NovoLog before lunch and also before dinner. CGM interpretation: -At today's visit, we reviewed her CGM downloads: It appears that 87% of values are in target range (goal >70%), while 17% are higher than 180 (goal <25%), and 0% are lower than 70 (goal <4%).  The calculated average blood sugar is 142.  The projected HbA1c for the next 3 months (GMI) is actually 6.7%, a tremendous improvement from last visit.. -Reviewing the CGM trends, almost all of her blood sugars are actually in target range, with occasional higher blood sugars after lunch and after dinner and sometimes during the night.  However, this is significant improvement from last visit, so for now, I would not recommend changes in her regimen.  We did discuss about starting Wegovy, which her insurance covers for weight loss.  I explained that this is a GLP-1 receptor agonist, and the same with Trulicity, and she will need to come off Trulicity if starting this.  Also, I expect that we need to back off some of her insulin doses if she starts this medication.  She will discuss about this with PCP. -At this visit, she does not that she tolerates Trulicity very well, without any GI symptoms.  In this case, I suspect that her  previous GI symptoms were actually from uncontrolled diabetes rather than the GLP-1 receptor agonist - I suggested to:  Patient Instructions  Please continue: - Tresiba 55 units daily - Novolog: - 8-10 units before b'fast - 10-14 units before lunch - 12-16 units before dinner - Trulicity 1.5 mg weekly  STOP SODAS!  If you change to [facility], you need to stop Trulicity and probably decrease insulin doses.  Please return in 3 months.  - we checked her HbA1c: 8.4% (much better) - advised to check sugars at different times of the day - 3x a day, rotating check times - advised for yearly eye exams >> she is UTD - return to clinic in 3 months   2. HL -Reviewed latest lipid panel from 10/2019: LDL extremely high, after which the Zocor dose was increased, triglycerides slightly high, HDL low: Lab Results  Component Value Date   CHOL 272 (H) 11/01/2019   HDL 45 (L) 11/01/2019   LDLCALC 193 (H) 11/01/2019   LDLDIRECT 103.0 09/09/2017   TRIG 171 (H) 11/01/2019   CHOLHDL 6.0 (H) 11/01/2019  -Continues Zocor 40 without side effects.  3. Obesity class III -Previously on SGLT2 inhibitor and GLP-1 receptor agonist but came off due to side effects (undigested food, GERD).  At last visit she was again interested in Trulicity and we restarted this.  This is now tolerated well. -She gained 9 pounds before last visit and lost 6 pounds before this visit -PCP suggested to start Contrave, however, per review of the chart, her insurance does not cover this medication but it covers Qsymia, Saxenda and Wegovy -we discussed about Mancel Parsons is being very efficient for weight loss and a very good alternative to Trulicity, so, of the 3 medications covered, I would suggest this.  She will discuss with PCP about possibly starting it.  Component     Latest Ref Rng & Units 09/19/2020  Sodium     135 - 145 mEq/L 138  Potassium     3.5 - 5.1 mEq/L 4.6  Chloride     96 - 112 mEq/L 103  CO2     19 - 32 mEq/L 29   Glucose     70 - 99 mg/dL 107 (H)  BUN     6 - 23 mg/dL 11  Creatinine     0.40 - 1.20 mg/dL 1.25 (H)  Total Bilirubin     0.2 - 1.2 mg/dL 0.3  Alkaline Phosphatase     39 - 117 U/L 67  AST     0 - 37 U/L 13  ALT     0 - 35 U/L 13  Total Protein     6.0 - 8.3 g/dL 7.5  Albumin     3.5 - 5.2 g/dL 4.2  GFR     >60.00 mL/min 49.37 (L)  Calcium     8.4 - 10.5 mg/dL 9.9  Cholesterol     0 - 200 mg/dL 162  Triglycerides     0.0 - 149.0 mg/dL 132.0  HDL Cholesterol     >39.00 mg/dL 40.90  VLDL     0.0 - 40.0 mg/dL 26.4  LDL (calc)     0 - 99 mg/dL 95  Total CHOL/HDL Ratio      4  NonHDL      121.07  Microalb, Ur     0.0 - 1.9 mg/dL <0.7  Creatinine,U     mg/dL 76.1  MICROALB/CREAT RATIO     0.0 - 30.0 mg/g 0.9  Kidney function is better.  The rest of the labs are at goal with exception of LDL, which is still higher than goal.  We will discuss to change Zocor to Crestor 10 mg daily.  Philemon Kingdom, MD PhD [facility] Endocrinology

## 2020-09-19 NOTE — Patient Instructions (Addendum)
Please continue: - Tresiba 55 units daily - Novolog: - 8-10 units before b'fast - 10-14 units before lunch - 12-16 units before dinner - Trulicity 1.5 mg weekly  STOP SODAS!  If you change to [facility], you need to stop Trulicity and probably decrease insulin doses.  Please return in 3 months.

## 2020-09-19 NOTE — Addendum Note (Signed)
Addended by: Kenyon Ana on: 09/19/2020 02:33 PM   Modules accepted: Orders

## 2020-09-20 ENCOUNTER — Encounter: Payer: Self-pay | Admitting: Internal Medicine

## 2020-09-20 ENCOUNTER — Other Ambulatory Visit: Payer: Self-pay | Admitting: Internal Medicine

## 2020-09-20 DIAGNOSIS — Z794 Long term (current) use of insulin: Secondary | ICD-10-CM

## 2020-09-20 DIAGNOSIS — E1122 Type 2 diabetes mellitus with diabetic chronic kidney disease: Secondary | ICD-10-CM

## 2020-09-20 MED ORDER — ROSUVASTATIN CALCIUM 10 MG PO TABS: 10.0000 mg | ORAL_TABLET | Freq: Every day | ORAL | 3 refills | Status: DC

## 2020-09-20 MED ORDER — FREESTYLE LIBRE 2 SENSOR MISC: 1 refills | Status: DC

## 2020-09-24 MED ORDER — SEMAGLUTIDE-WEIGHT MANAGEMENT 0.5 MG/0.5ML ~~LOC~~ SOAJ: 0.5000 mg | SUBCUTANEOUS | 0 refills | Status: DC

## 2020-09-25 NOTE — Telephone Encounter (Signed)
Pt requested refills for sensors via MyChart.   Refills sent to preferred pharmacy.

## 2020-09-27 ENCOUNTER — Encounter: Payer: Self-pay | Admitting: Physician Assistant

## 2020-10-17 ENCOUNTER — Encounter: Payer: Self-pay | Admitting: Physician Assistant

## 2020-11-13 ENCOUNTER — Encounter: Payer: Self-pay | Admitting: Physician Assistant

## 2020-11-13 ENCOUNTER — Telehealth: Payer: BC Managed Care – PPO | Admitting: Nurse Practitioner

## 2020-11-13 ENCOUNTER — Telehealth (INDEPENDENT_AMBULATORY_CARE_PROVIDER_SITE_OTHER): Payer: BC Managed Care – PPO | Admitting: Physician Assistant

## 2020-11-13 VITALS — Ht 62.0 in | Wt 240.0 lb

## 2020-11-13 DIAGNOSIS — J40 Bronchitis, not specified as acute or chronic: Secondary | ICD-10-CM

## 2020-11-13 DIAGNOSIS — J069 Acute upper respiratory infection, unspecified: Secondary | ICD-10-CM

## 2020-11-13 DIAGNOSIS — J329 Chronic sinusitis, unspecified: Secondary | ICD-10-CM | POA: Diagnosis not present

## 2020-11-13 MED ORDER — PREDNISONE 50 MG PO TABS: ORAL_TABLET | ORAL | 0 refills | Status: DC

## 2020-11-13 MED ORDER — AZITHROMYCIN 250 MG PO TABS: ORAL_TABLET | ORAL | 0 refills | Status: DC

## 2020-11-13 MED ORDER — ALBUTEROL SULFATE HFA 108 (90 BASE) MCG/ACT IN AERS: 2.0000 | INHALATION_SPRAY | Freq: Four times a day (QID) | RESPIRATORY_TRACT | 0 refills | Status: DC | PRN

## 2020-11-13 NOTE — Progress Notes (Signed)
We are sorry that you are not feeling well.  Here is how we plan to help!  Based on your presentation I believe you most likely have A cough due to a virus.  This is called viral bronchitis and is best treated by rest, plenty of fluids and control of the cough.  You may use Ibuprofen or Tylenol as directed to help your symptoms.     In addition you may use A non-prescription cough medication called Mucinex DM: take 2 tablets every 12 hours.  ALbuterol HFA- 2 puffs q6 prn shortness of breath. Precription was called in.  I would have liked ti send in prednisone but due to your out if control diabetes we are not able to do that.  From your responses in the eVisit questionnaire you describe inflammation in the upper respiratory tract which is causing a significant cough.  This is commonly called Bronchitis and has four common causes:    Allergies  Viral Infections  Acid Reflux  Bacterial Infection Allergies, viruses and acid reflux are treated by controlling symptoms or eliminating the cause. An example might be a cough caused by taking certain blood pressure medications. You stop the cough by changing the medication. Another example might be a cough caused by acid reflux. Controlling the reflux helps control the cough.  USE OF BRONCHODILATOR ("RESCUE") INHALERS: There is a risk from using your bronchodilator too frequently.  The risk is that over-reliance on a medication which only relaxes the muscles surrounding the breathing tubes can reduce the effectiveness of medications prescribed to reduce swelling and congestion of the tubes themselves.  Although you feel brief relief from the bronchodilator inhaler, your asthma may actually be worsening with the tubes becoming more swollen and filled with mucus.  This can delay other crucial treatments, such as oral steroid medications. If you need to use a bronchodilator inhaler daily, several times per day, you should discuss this with your provider.   There are probably better treatments that could be used to keep your asthma under control.     HOME CARE . Only take medications as instructed by your medical team. . Complete the entire course of an antibiotic. . Drink plenty of fluids and get plenty of rest. . Avoid close contacts especially the very young and the elderly . Cover your mouth if you cough or cough into your sleeve. . Always remember to wash your hands . A steam or ultrasonic humidifier can help congestion.   GET HELP RIGHT AWAY IF: . You develop worsening fever. . You become short of breath . You cough up blood. . Your symptoms persist after you have completed your treatment plan MAKE SURE YOU   Understand these instructions.  Will watch your condition.  Will get help right away if you are not doing well or get worse.  Your e-visit answers were reviewed by a board certified advanced clinical practitioner to complete your personal care plan.  Depending on the condition, your plan could have included both over the counter or prescription medications. If there is a problem please reply  once you have received a response from your provider. Your safety is important to [place].  If you have drug allergies check your prescription carefully.    You can use MyChart to ask questions about today's visit, request a non-urgent call back, or ask for a work or school excuse for 24 hours related to this e-Visit. If it has been greater than 24 hours you will need to follow  up with your provider, or enter a new e-Visit to address those concerns. You will get an e-mail in the next two days asking about your experience.  I hope that your e-visit has been valuable and will speed your recovery. Thank you for using e-visits.  5-10 minutes spent reviewing and documenting in chart.

## 2020-11-13 NOTE — Progress Notes (Signed)
Patient ID: Jasmine [name], female   DOB: 1965/12/02, 55 y.o.   MRN: [id number] .Marland KitchenVirtual Visit via Telephone Note  I connected with Jasmine [name] on 11/13/2020 at  2:20 PM EDT by telephone and verified that I am speaking with the correct person using two identifiers.  Location: Patient: home Provider: clinic  .Marland KitchenParticipating in visit:  Patient: Jasmine [name] Provider: Tandy Gaw PA-C   I discussed the limitations, risks, security and privacy concerns of performing an evaluation and management service by telephone and the availability of in person appointments. I also discussed with the patient that there may be a patient responsible charge related to this service. The patient expressed understanding and agreed to proceed.   History of Present Illness: Pt is a 55 yo obese female with T2DM, HLD who presents to the clinic with tickle in throat, sinus pressure, headdache, fatigue, cough and wheezing that started Friday, about 6 days ago. Pt is a former smoker. She tested negative with at home covid test. She is a former smoker. She is taking flonase, zyrtec, dayquil, nyquil, mucinex and immune booster tea. No fever, chills, body aches, GI symptoms or loss of smell or taste.   .. Active Ambulatory Problems    Diagnosis Date Noted  . Depression 02/09/2010  . ALLERGIC RHINITIS 02/09/2010  . Obesity, Class III, BMI 40-49.9 (morbid obesity) (HCC) 06/03/2012  . Hyperlipidemia 11/27/2013  . Primary osteoarthritis of both knees 11/27/2013  . Chronic renal insufficiency, stage II (mild) 11/28/2013  . Umbilical hernia without obstruction and without gangrene 02/02/2014  . Ventral hernia, recurrent 03/28/2014  . Essential hypertension 01/09/2015  . Erroneous encounter - disregard 03/12/2016  . Hyperlipidemia associated with type 2 diabetes mellitus (HCC) 06/17/2016  . Anxiety state 06/17/2016  . Tobacco use 08/13/2016  . Micturition syncope 08/13/2016  . Vitamin D deficiency 11/27/2016  . B12  deficiency 11/27/2016  . Frequent headaches 01/05/2017  . Menopause 05/13/2017  . Severe episode of recurrent major depressive disorder, without psychotic features (HCC) 05/19/2017  . Generalized anxiety disorder 05/19/2017  . Severe major depression, single episode, without psychotic features (HCC) 05/26/2017  . Type 2 diabetes mellitus with chronic kidney disease, with long-term current use of insulin (HCC) 07/12/2017  . Acute stress reaction 03/09/2018  . Class 3 severe obesity due to excess calories with serious comorbidity and body mass index (BMI) of 45.0 to 49.9 in adult (HCC) 11/21/2018  . Vision changes 08/21/2019  . Numbness and tingling of right arm 08/21/2019  . Impingement syndrome, shoulder, left 10/19/2019   Resolved Ambulatory Problems    Diagnosis Date Noted  . No Resolved Ambulatory Problems   Past Medical History:  Diagnosis Date  . Anxiety   . Arthritis   . Chronic renal insufficiency   . Headache   . Hypercholesteremia   . Hypertension   . Numbness and tingling of right arm and leg   . Seasonal allergies   . Type II diabetes mellitus (HCC)   . Umbilical hernia without mention of obstruction or gangrene   . Wears glasses    Reviewed med, allergy, problem list.    Observations/Objective: No acute distress No labored breathing or wheezing. Productive barking cough on telephone.   .. Today's Vitals   11/13/20 1337  Weight: 240 lb (108.9 kg)  Height: 5\' 2"  (1.575 m)   Body mass index is 43.9 kg/m.    Assessment and Plan: Marland KitchenChasiti was seen today for sinus problem.  Diagnoses and all orders for this visit:  Sinobronchitis -  azithromycin (ZITHROMAX Z-PAK) 250 MG tablet; Take 2 tablets (500 mg) on  Day 1,  followed by 1 tablet (250 mg) once daily on Days 2 through 5. -     predniSONE (DELTASONE) 50 MG tablet; One tab PO daily for 5 days. -     albuterol (VENTOLIN HFA) 108 (90 Base) MCG/ACT inhaler; Inhale 2 puffs into the lungs every 6 (six)  hours as needed.   Testing negative at home covid test.  Added zpak, prednisone, and albuterol inhaler. Follow up as needed if symptoms worsen or persist. Continue symptomatic care management.   Follow Up Instructions:    I discussed the assessment and treatment plan with the patient. The patient was provided an opportunity to ask questions and all were answered. The patient agreed with the plan and demonstrated an understanding of the instructions.   The patient was advised to call back or seek an in-person evaluation if the symptoms worsen or if the condition fails to improve as anticipated.  I provided 10 minutes of non-face-to-face time during this encounter.   Tandy Gaw, PA-C

## 2020-11-13 NOTE — Progress Notes (Signed)
Started Friday Wheezing Tickle in throat Sinus pressure/headache Fatigue Cough  Taking flonase/zyrtec/dayquil/ nyquil/mucinex/immune booster tea  Took at home Covid test - negative

## 2020-11-18 ENCOUNTER — Telehealth: Payer: BC Managed Care – PPO | Admitting: Physician Assistant

## 2020-12-19 ENCOUNTER — Other Ambulatory Visit: Payer: Self-pay

## 2020-12-19 ENCOUNTER — Ambulatory Visit: Payer: 59 | Admitting: Internal Medicine

## 2020-12-19 ENCOUNTER — Encounter: Payer: Self-pay | Admitting: Internal Medicine

## 2020-12-19 VITALS — BP 140/90 | HR 79 | Ht 62.0 in | Wt 251.6 lb

## 2020-12-19 DIAGNOSIS — E1169 Type 2 diabetes mellitus with other specified complication: Secondary | ICD-10-CM | POA: Diagnosis not present

## 2020-12-19 DIAGNOSIS — Z794 Long term (current) use of insulin: Secondary | ICD-10-CM | POA: Diagnosis not present

## 2020-12-19 DIAGNOSIS — E1122 Type 2 diabetes mellitus with diabetic chronic kidney disease: Secondary | ICD-10-CM

## 2020-12-19 DIAGNOSIS — E785 Hyperlipidemia, unspecified: Secondary | ICD-10-CM

## 2020-12-19 LAB — POCT GLYCOSYLATED HEMOGLOBIN (HGB A1C): Hemoglobin A1C: 7.3 % — AB (ref 4.0–5.6)

## 2020-12-19 MED ORDER — TRESIBA FLEXTOUCH 200 UNIT/ML ~~LOC~~ SOPN: 56.0000 [IU] | PEN_INJECTOR | Freq: Every day | SUBCUTANEOUS | 3 refills | Status: DC

## 2020-12-19 MED ORDER — OZEMPIC (1 MG/DOSE) 4 MG/3ML ~~LOC~~ SOPN: 1.0000 mg | PEN_INJECTOR | SUBCUTANEOUS | 3 refills | Status: DC

## 2020-12-19 NOTE — Progress Notes (Signed)
Patient ID: ISABEL ARDILA, female   DOB: 04/18/1966, 55 y.o.   MRN: [id number]   This visit occurred during the SARS-CoV-2 public health emergency.  Safety protocols were in place, including screening questions prior to the visit, additional usage of staff PPE, and extensive cleaning of exam room while observing appropriate contact time as indicated for disinfecting solutions.   HPI: NAYANNA SEABORN is a 55 y.o.-year-old female, presenting for f/u for DM2, dx in 2012-12-20, but had GDM in Dec 21, 1990, insulin-dependent, uncontrolled, with complications (CKD, DR).  She has been seen previously by Dr. Everardo All. Last visit 3 months ago.  Interim history: She was previously noncompliant with her medicines and appointments due to depression.  She goes to behavioral health.  Her husband died of cancer in 12-21-18.  However, in the last year, she started to be more conscientious in improving her diabetes. She has no new complaints at this visit.  She did have occasional low blood sugars at last visit, in the 60s on the CGM.  She did not always feel these. She stopped sodas since last visit. She changed her insurance since last visit.  She thinks Evaristo Bury may not be covered but will let me know.  Reviewed HbA1c levels: Lab Results  Component Value Date   HGBA1C 8.4 (A) 09/19/2020   HGBA1C 11.7 (A) 05/16/2020   HGBA1C 7.8 (A) 11/01/2019   Previously on: -  >> stopped 2/2 increased urination -  >> stopped 2/2 GERD, undigested food - Tresiba 20 >> 56 units daily - misses 3 days a week  Currently on: - Tresiba 56 units daily - Novolog: mostly 2x a day - 8-10 units before b'fast - 10-14 units before lunch - 12-16 units before dinner - Trulicity 1.5 mg weekly-restarted 06/2020 [facility] was not approved yet). Metformin >> tongue swelling. Farxiga >> repeated yeast inf's (04/2017).  She checks her sugars more than 4 times a day with her  freestyle libre 2 CGM.   Previously:   Lowest sugar was 132 >> 65 >> 69  >> 60; she has hypoglycemia awareness in the 80s. Highest sugar was 500 >> 260 >> 243 >> 200s.  Glucometer: FreeStyle  Pt's meals are: - Breakfast: coffee or PB sandwich + coffee - Lunch: chicken, veggies - Dinner: bowl of cereal or skips >> now 10 oz of meat and 2 cups of veggies - Snacks: many: M and M, granola bar, trailmix She was previously going to the weight management clinic, but not recently.  -+ CKD, last BUN/creatinine:  Lab Results  Component Value Date   BUN 11 09/19/2020   BUN 11 11/01/2019   CREATININE 1.25 (H) 09/19/2020   CREATININE 1.36 (H) 11/01/2019  On losartan. -+ HL; last set of lipids: Lab Results  Component Value Date   CHOL 162 09/19/2020   HDL 40.90 09/19/2020   LDLCALC 95 09/19/2020   LDLDIRECT 103.0 09/09/2017   TRIG 132.0 09/19/2020   CHOLHDL 4 09/19/2020  Previously on Zocor 40, changed to Crestor 10 09/2020. - last eye exam was in 06/2020: + DR - no numbness and tingling in her feet.   Pt has FH of DM in "everybody" - both sides of family: M, F, S's, B's, aunts.  ROS: Constitutional: + weight gain/+ weight loss, no fatigue, no subjective hyperthermia, no subjective hypothermia Eyes: no blurry vision, no xerophthalmia ENT: no sore throat, no nodules palpated in neck, no dysphagia, no odynophagia, no hoarseness Cardiovascular: no CP/no SOB/no palpitations/no leg swelling Respiratory: no cough/no SOB/no  wheezing Gastrointestinal: no N/no V/no D/no C/no acid reflux Musculoskeletal: no muscle aches/no joint aches Skin: no rashes, no hair loss Neurological: no tremors/no numbness/no tingling/no dizziness  I reviewed pt's medications, allergies, PMH, social hx, family hx, and changes were documented in the history of present illness. Otherwise, unchanged from my initial visit note.  Past Medical History:  Diagnosis Date  . Anxiety   . Arthritis    "knees" (03/28/2014)  . Chronic renal insufficiency    stage II (mild)  . Depression   .  Headache    with high blood sugar  . Hypercholesteremia   . Hypertension   . Numbness and tingling of right arm and leg   . Seasonal allergies   . Type II diabetes mellitus (HCC)   . Umbilical hernia without mention of obstruction or gangrene   . Wears glasses    Past Surgical History:  Procedure Laterality Date  . INSERTION OF MESH N/A 03/28/2014   Procedure: INSERTION OF MESH;  Surgeon: Axel Filler, MD;  Location: [facility] OR;  Service: General;  Laterality: N/A;  . INSERTION OF MESH N/A 05/08/2015   Procedure: INSERTION OF MESH;  Surgeon: Axel Filler, MD;  Location: [facility] OR;  Service: General;  Laterality: N/A;  . LAPAROSCOPIC LYSIS OF ADHESIONS N/A 05/08/2015   Procedure: LAPAROSCOPIC LYSIS OF ADHESIONS;  Surgeon: Axel Filler, MD;  Location: [facility] OR;  Service: General;  Laterality: N/A;  . MULTIPLE TOOTH EXTRACTIONS  ~ 2003  . TONSILLECTOMY    . UMBILICAL HERNIA REPAIR  1973  . VENTRAL HERNIA REPAIR N/A 03/28/2014   Procedure: LAPAROSCOPIC VENTRAL HERNIA;  Surgeon: Axel Filler, MD;  Location: [facility] OR;  Service: General;  Laterality: N/A;  . VENTRAL HERNIA REPAIR N/A 05/08/2015   Procedure: LAPAROSCOPIC VENTRAL HERNIA REPAIR WITH MESH;  Surgeon: Axel Filler, MD;  Location: [facility] OR;  Service: General;  Laterality: N/A;   Social History   Socioeconomic History  . Marital status: Widowed    Spouse name: Florencia Reasons  . Number of children: 1  . Years of education: Not on file  . Highest education level: Not on file  Occupational History  . Not on file  Tobacco Use  . Smoking status: Former Smoker    Packs/day: 0.25    Years: 26.00    Pack years: 6.50    Types: Cigarettes  . Smokeless tobacco: Never Used  Vaping Use  . Vaping Use: Never used  Substance and Sexual Activity  . Alcohol use: Yes    Comment: "have a drink a few times/year"  . Drug use: No  . Sexual activity: Not Currently  Other Topics Concern  . Not on file  Social History Narrative  . Not on file    Social Determinants of Health   Financial Resource Strain: Not on file  Food Insecurity: Not on file  Transportation Needs: Not on file  Physical Activity: Not on file  Stress: Not on file  Social Connections: Not on file  Intimate Partner Violence: Not on file   Current Outpatient Medications on File Prior to Visit  Medication Sig Dispense Refill  . albuterol (VENTOLIN HFA) 108 (90 Base) MCG/ACT inhaler Inhale 2 puffs into the lungs every 6 (six) hours as needed. 18 g 0  . azithromycin (ZITHROMAX Z-PAK) 250 MG tablet Take 2 tablets (500 mg) on  Day 1,  followed by 1 tablet (250 mg) once daily on Days 2 through 5. 6 tablet 0  . Continuous Blood Gluc Sensor (FREESTYLE LIBRE 2  SENSOR) MISC Use as instructed to be changed every 14 days 6 each 1  . Dulaglutide (TRULICITY) 1.5 MG/0.5ML SOPN Inject 1.5 mg into the skin once a week. 4.5 mL 3  . insulin aspart (NOVOLOG FLEXPEN) 100 UNIT/ML FlexPen Inject 12-20 Units into the skin 3 (three) times daily with meals. 30 mL 3  . insulin degludec (TRESIBA FLEXTOUCH) 200 UNIT/ML FlexTouch Pen Inject 56 Units into the skin daily. 27 mL 1  . Insulin Pen Needle 32G X 4 MM MISC Use 4x a day 300 each 3  . olmesartan-hydrochlorothiazide (BENICAR HCT) 20-12.5 MG tablet Take 1 tablet by mouth daily. 90 tablet 1  . predniSONE (DELTASONE) 50 MG tablet One tab PO daily for 5 days. 5 tablet 0  . rosuvastatin (CRESTOR) 10 MG tablet Take 1 tablet (10 mg total) by mouth daily. 90 tablet 3  . sertraline (ZOLOFT) 100 MG tablet TAKE ONE AND ONE/HALF TABLET DAILY. 135 tablet 1  . Vitamin D, Ergocalciferol, (DRISDOL) 1.25 MG (50000 UNIT) CAPS capsule Take 1 capsule (50,000 Units total) by mouth every 7 (seven) days. 12 capsule 1   No current facility-administered medications on file prior to visit.   Allergies  Allergen Reactions  . Eggs Or Egg-Derived Products Hives and Other (See Comments)    FLU VACCINE: caused hives    . Influenza Vaccines Hives    Vaccine from  Egg derived product causes Hives  . Effexor [Venlafaxine]     "felt weird"  . Metformin And Related Swelling    ANGIOEDEMA   Family History  Problem Relation Age of Onset  . Diabetes Mother   . High Cholesterol Mother   . Obesity Mother   . Diabetes Father   . Cancer Father        Prostate cancer  . Cancer Sister        Cervical cancer  . Diabetes Brother   . Bipolar disorder Brother   . Alcohol abuse Brother   . Drug abuse Brother   . Diabetes Sister   . Diabetes Brother     PE: BP 140/90 (BP Location: Right Arm, Patient Position: Sitting, Cuff Size: Large)   Pulse 79   Ht 5\' 2"  (1.575 m)   Wt 251 lb 9.6 oz (114.1 kg)   SpO2 99%   BMI 46.02 kg/m  Wt Readings from Last 3 Encounters:  12/19/20 251 lb 9.6 oz (114.1 kg)  11/13/20 240 lb (108.9 kg)  09/19/20 245 lb (111.1 kg)   Constitutional: overweight, in NAD Eyes: PERRLA, EOMI, no exophthalmos ENT: moist mucous membranes, no thyromegaly, no cervical lymphadenopathy Cardiovascular: RRR, No MRG Respiratory: CTA B Gastrointestinal: abdomen soft, NT, ND, BS+ Musculoskeletal: no deformities, strength intact in all 4 Skin: moist, warm, no rashes Neurological: no tremor with outstretched hands, DTR normal in all 4  ASSESSMENT: 1. DM2, insulin-dependent, uncontrolled, with complications - CKD stage 2 - DR  2. HL  3. Obesity class III  PLAN:  1. Patient with longstanding, uncontrolled, type 2 diabetes, on a basal-bolus insulin regimen and also weekly GLP-1 receptor agonist.  She had GI symptoms with Trulicity in the past but we will then were able to restart it again without side effects.  At last visit, she was on 1.5 mg weekly.  However[facility] so we stopped Trulicity.  Of note, she was on an SGLT2 inhibitor in the past [place](Farxiga) but she came off due to increased urination.  Retrospectively, this could have been caused by  glycosuria in the setting of uncontrolled diabetes.  At last visit,  sugars were much better so I did not suggest to retry the SGLT2 inhibitor.  At that time, most of her blood sugars were in target range with occasional high blood sugars after lunch and after dinner and sometimes during the night.  HbA1c was 8.4%, which is much improved. CGM interpretation: -At today's visit, we reviewed her CGM downloads: It appears that 84% of values are in target range (goal >70%), while 60% are higher than 180 (goal <25%), and 0% are lower than 70 (goal <4%).  The calculated average blood sugar is 141.  The projected HbA1c for the next 3 months (GMI) is 6.7%. -Reviewing the CGM trends, it appears that her sugars are well controlled overnight and they are more fluctuating.  And they with occasional spikes after lunch and after dinner.  However, the vast majority of her blood sugars are within range.  She occasionally has blood sugar in the 60s before dinner. -At this visit, we discussed about increasing the dose of the GLP-1 receptor agonist (since she could not get Wegovy approved by her insurance yet).  Since she changed her insurance, we will try to switch from Trulicity to Ozempic 1 mg weekly.  I advised her that if this is not covered, we can go back to Trulicity but at the higher dose, of 3 mg weekly.  I advised her to decrease the dose of mealtime insulin after switching to Ozempic or increasing the dose of Trulicity.  If sugars remain low between meals, I advised her to also back off Tresiba dose.   -We discussed about the CGM occasionally showing below her glycemic readings than the actual meter.  I advised her when to check with her meter. - I suggested to:  Patient Instructions  Please continue: - Tresiba 56 units daily - Novolog: - 8-10 units before b'fast - 10-14 units before lunch - 12-16 units before dinner  Try to switch Trulicity to: - Ozempic 1 mg weekly  If sugars improve after the above change, decrease mealtime insulin of 8-10 before each meal.  Please  return in 3 months.  - we checked her HbA1c: 7.3% (much better) - advised to check sugars at different times of the day - 4x a day, rotating check times - advised for yearly eye exams >> she is UTD - return to clinic in 3 months  2. HL -Reviewed latest lipid panel from 09/2020: LDL was above our goal of less than 70, the rest of the fractions at goal: Lab Results  Component Value Date   CHOL 162 09/19/2020   HDL 40.90 09/19/2020   LDLCALC 95 09/19/2020   LDLDIRECT 103.0 09/09/2017   TRIG 132.0 09/19/2020   CHOLHDL 4 09/19/2020  -At last visit, I suggested to change to Crestor 10 from Zocor 40, since LDL was still above goal  3. Obesity class III -Previously on SGLT2 inhibitor, but she could not tolerate this.  Now on GLP-1 receptor agonist. -She tried to switch to [facility] but this was not approved by her insurance yet -At this visit, I suggested a switch to Ozempic.  We will see if this is approved by her insurance.  If not, we will try to increase Trulicity to 3 mg weekly.  They should also help with weight loss.   Carlus Pavlov, MD PhD [facility] Endocrinology

## 2020-12-19 NOTE — Patient Instructions (Addendum)
Please continue: - Tresiba 56 units daily - Novolog: - 8-10 units before b'fast - 10-14 units before lunch - 12-16 units before dinner  Try to switch Trulicity to: - Ozempic 1 mg weekly  If sugars improve after the above change, decrease mealtime insulin o 8-10 before each meal.  Please return in 3 months.

## 2020-12-25 ENCOUNTER — Encounter: Payer: Self-pay | Admitting: Internal Medicine

## 2020-12-25 DIAGNOSIS — E1122 Type 2 diabetes mellitus with diabetic chronic kidney disease: Secondary | ICD-10-CM

## 2020-12-25 DIAGNOSIS — Z794 Long term (current) use of insulin: Secondary | ICD-10-CM

## 2021-01-06 MED ORDER — LANTUS SOLOSTAR 100 UNIT/ML ~~LOC~~ SOPN: 56.0000 [IU] | PEN_INJECTOR | Freq: Every day | SUBCUTANEOUS | 2 refills | Status: DC

## 2021-01-07 ENCOUNTER — Telehealth: Payer: Self-pay

## 2021-01-07 ENCOUNTER — Other Ambulatory Visit: Payer: Self-pay | Admitting: Internal Medicine

## 2021-01-07 DIAGNOSIS — E1122 Type 2 diabetes mellitus with diabetic chronic kidney disease: Secondary | ICD-10-CM

## 2021-01-07 DIAGNOSIS — Z794 Long term (current) use of insulin: Secondary | ICD-10-CM

## 2021-01-07 NOTE — Telephone Encounter (Signed)
[id number]. OZEMPIC INJ 4MG /3ML is approved through 12/26/2021.

## 2021-01-13 MED ORDER — FREESTYLE LIBRE 2 SENSOR MISC: 1 refills | Status: DC

## 2021-01-13 NOTE — Telephone Encounter (Signed)
Called and confirmed with pt Ozempic PA approved and should be available for pick up. Pt advised she will see if price is manageable

## 2021-01-13 NOTE — Addendum Note (Signed)
Addended by: Kenyon Ana on: 01/13/2021 09:47 AM   Modules accepted: Orders

## 2021-01-14 ENCOUNTER — Telehealth: Payer: Self-pay | Admitting: Pharmacy Technician

## 2021-01-14 NOTE — Telephone Encounter (Addendum)
Patient Advocate Encounter   Received notification from [facility] that prior authorization for FREESTYLE LIBRE 2 SENSOR is required.   PA submitted on 01/14/2021 Key [id number] Status is APPROVED  [id number]    [facility] will continue to follow.   Netty Starring. Dimas Aguas, CPhT Patient Advocate [facility] Phone: [phone] Fax:  [phone]

## 2021-01-15 ENCOUNTER — Other Ambulatory Visit: Payer: Self-pay | Admitting: Physician Assistant

## 2021-01-15 ENCOUNTER — Encounter: Payer: Self-pay | Admitting: Physician Assistant

## 2021-01-15 DIAGNOSIS — I10 Essential (primary) hypertension: Secondary | ICD-10-CM

## 2021-01-22 ENCOUNTER — Ambulatory Visit (INDEPENDENT_AMBULATORY_CARE_PROVIDER_SITE_OTHER): Payer: 59

## 2021-01-22 ENCOUNTER — Ambulatory Visit (INDEPENDENT_AMBULATORY_CARE_PROVIDER_SITE_OTHER): Payer: 59 | Admitting: Sports Medicine

## 2021-01-22 ENCOUNTER — Other Ambulatory Visit: Payer: Self-pay

## 2021-01-22 DIAGNOSIS — M170 Bilateral primary osteoarthritis of knee: Secondary | ICD-10-CM | POA: Diagnosis not present

## 2021-01-22 MED ORDER — TRAMADOL HCL 50 MG PO TABS: 50.0000 mg | ORAL_TABLET | Freq: Three times a day (TID) | ORAL | 0 refills | Status: DC | PRN

## 2021-01-22 NOTE — Progress Notes (Signed)
    Procedures performed today:    Procedure: Real-time Ultrasound Guided aspiration/injection of the left knee Device: Samsung HS60  Verbal informed consent obtained.  Time-out conducted.  Noted no overlying erythema, induration, or other signs of local infection.  Skin prepped in a sterile fashion.  Local anesthesia: Topical Ethyl chloride.  With sterile technique and under real time ultrasound guidance: Noted moderate effusion, using an 18-gauge needle aspirated approximately 22 mL of clear, straw-colored fluid, syringe switched and, 2 cc Kenalog 40, 2 cc lidocaine, 2 cc bupivacaine injected easily Completed without difficulty  Advised to call if fevers/chills, erythema, induration, drainage, or persistent bleeding.  Images permanently stored and available for review in PACS.  Impression: Technically successful ultrasound guided injection.  Independent interpretation of notes and tests performed by another provider:   None.  Brief History, Exam, Impression, and Recommendations:    Primary osteoarthritis of both knees This is a pleasant 55 year old female with chronic knee pain, known osteoarthritis, last injected in December 2021. Recurrence of pain at the medial femoral condyle, we did an aspiration/injection today with double dose of triamcinolone, adding updated x-rays, MRI due to greater than 6 weeks of failure of conservative treatment. Refilling tramadol. Return to see me to go over MRI results.     ___________________________________________ Ihor Austin. Benjamin Stain, M.D., ABFM., CAQSM. Primary Care and Sports Medicine [facility]  Adjunct Instructor of Family Medicine  [employer]

## 2021-01-22 NOTE — Assessment & Plan Note (Addendum)
This is a pleasant 55 year old female with chronic knee pain, known osteoarthritis, last injected in December 2021. Recurrence of pain at the medial femoral condyle, we did an aspiration/injection today with double dose of triamcinolone, adding updated x-rays, MRI due to greater than 6 weeks of failure of conservative treatment. Refilling tramadol. Return to see me to go over MRI results.

## 2021-02-13 ENCOUNTER — Telehealth: Payer: Self-pay | Admitting: Pharmacy Technician

## 2021-02-13 NOTE — Telephone Encounter (Signed)
[facility] Patient Advocate Encounter  Prior Authorization for [facility] 4MG has been approved.    PA# [id number] Effective dates: 12/26/2020 through 12/26/2021      [facility] will continue to follow.   [facility] [place]. Netty Starring, CPhT Patient Advocate  End[facility]inology Clinic Phone: [phone] Fax:  [phone]

## 2021-02-18 ENCOUNTER — Other Ambulatory Visit: Payer: Self-pay | Admitting: Physician Assistant

## 2021-02-18 DIAGNOSIS — I10 Essential (primary) hypertension: Secondary | ICD-10-CM

## 2021-02-28 ENCOUNTER — Encounter: Payer: Self-pay | Admitting: Physician Assistant

## 2021-03-02 ENCOUNTER — Other Ambulatory Visit: Payer: Self-pay | Admitting: Physician Assistant

## 2021-03-02 DIAGNOSIS — I10 Essential (primary) hypertension: Secondary | ICD-10-CM

## 2021-03-10 ENCOUNTER — Encounter: Payer: Self-pay | Admitting: Physician Assistant

## 2021-03-10 ENCOUNTER — Telehealth (INDEPENDENT_AMBULATORY_CARE_PROVIDER_SITE_OTHER): Payer: 59 | Admitting: Physician Assistant

## 2021-03-10 DIAGNOSIS — E785 Hyperlipidemia, unspecified: Secondary | ICD-10-CM

## 2021-03-10 DIAGNOSIS — I10 Essential (primary) hypertension: Secondary | ICD-10-CM

## 2021-03-10 DIAGNOSIS — E1122 Type 2 diabetes mellitus with diabetic chronic kidney disease: Secondary | ICD-10-CM

## 2021-03-10 DIAGNOSIS — E559 Vitamin D deficiency, unspecified: Secondary | ICD-10-CM | POA: Diagnosis not present

## 2021-03-10 DIAGNOSIS — Z789 Other specified health status: Secondary | ICD-10-CM

## 2021-03-10 DIAGNOSIS — N182 Chronic kidney disease, stage 2 (mild): Secondary | ICD-10-CM

## 2021-03-10 DIAGNOSIS — F322 Major depressive disorder, single episode, severe without psychotic features: Secondary | ICD-10-CM

## 2021-03-10 DIAGNOSIS — E1169 Type 2 diabetes mellitus with other specified complication: Secondary | ICD-10-CM

## 2021-03-10 DIAGNOSIS — Z794 Long term (current) use of insulin: Secondary | ICD-10-CM

## 2021-03-10 MED ORDER — OLMESARTAN MEDOXOMIL-HCTZ 20-12.5 MG PO TABS: 1.0000 | ORAL_TABLET | Freq: Every day | ORAL | 1 refills | Status: DC

## 2021-03-10 MED ORDER — VITAMIN D (ERGOCALCIFEROL) 1.25 MG (50000 UNIT) PO CAPS: 50000.0000 [IU] | ORAL_CAPSULE | ORAL | 1 refills | Status: DC

## 2021-03-10 MED ORDER — ATORVASTATIN CALCIUM 40 MG PO TABS: 40.0000 mg | ORAL_TABLET | Freq: Every day | ORAL | 3 refills | Status: DC

## 2021-03-10 MED ORDER — OZEMPIC (1 MG/DOSE) 4 MG/3ML ~~LOC~~ SOPN: 1.0000 mg | PEN_INJECTOR | SUBCUTANEOUS | 1 refills | Status: DC

## 2021-03-10 NOTE — Progress Notes (Signed)
..Virtual Visit via Video Note  I connected with Jasmine [name] on 03/17/21 at  2:20 PM EDT by a video enabled telemedicine application and verified that I am speaking with the correct person using two identifiers.  Location: Patient: work Provider: clinic  .Marland KitchenParticipating in visit:  Patient: Jasmine [name] Provider: Tandy Gaw PA-C   I discussed the limitations of evaluation and management by telemedicine and the availability of in person appointments. The patient expressed understanding and agreed to proceed.  History of Present Illness: Pt is a 55 yo obese female with T2DM, HTN, HLD who calls into the clinic for medication management.   Pt was seeing endocrinology but probably will not go back due to co-pays. Her tresiba was just switched to lantus due to cost. She has not picked up yet. Trulicity needs to be changed as well. .. Lab Results  Component Value Date   HGBA1C 7.3 (A) 12/19/2020   She needs to keep glucose down and be able to afford medications. Crestor caused arm pain willing to try another statin.   .. Active Ambulatory Problems    Diagnosis Date Noted   Depression 02/09/2010   ALLERGIC RHINITIS 02/09/2010   Obesity, Class III, BMI 40-49.9 (morbid obesity) (HCC) 06/03/2012   Hyperlipidemia 11/27/2013   Primary osteoarthritis of both knees 11/27/2013   Chronic renal insufficiency, stage II (mild) 11/28/2013   Umbilical hernia without obstruction and without gangrene 02/02/2014   Ventral hernia, recurrent 03/28/2014   Essential hypertension 01/09/2015   Erroneous encounter - disregard 03/12/2016   Hyperlipidemia associated with type 2 diabetes mellitus (HCC) 06/17/2016   Anxiety state 06/17/2016   Tobacco use 08/13/2016   Micturition syncope 08/13/2016   Vitamin D deficiency 11/27/2016   B12 deficiency 11/27/2016   Frequent headaches 01/05/2017   Menopause 05/13/2017   Severe episode of recurrent major depressive disorder, without psychotic features (HCC)  05/19/2017   Generalized anxiety disorder 05/19/2017   Severe major depression, single episode, without psychotic features (HCC) 05/26/2017   Type 2 diabetes mellitus with chronic kidney disease, with long-term current use of insulin (HCC) 07/12/2017   Acute stress reaction 03/09/2018   Class 3 severe obesity due to excess calories with serious comorbidity and body mass index (BMI) of 45.0 to 49.9 in adult (HCC) 11/21/2018   Vision changes 08/21/2019   Numbness and tingling of right arm 08/21/2019   Impingement syndrome, shoulder, left 10/19/2019   Statin intolerance 03/17/2021   Resolved Ambulatory Problems    Diagnosis Date Noted   No Resolved Ambulatory Problems   Past Medical History:  Diagnosis Date   Anxiety    Arthritis    Chronic renal insufficiency    Headache    Hypercholesteremia    Hypertension    Numbness and tingling of right arm and leg    Seasonal allergies    Type II diabetes mellitus (HCC)    Umbilical hernia without mention of obstruction or gangrene    Wears glasses        Observations/Objective: No acute distress Normal breathing. Normal mood and appearance.   Marland Kitchen.There were no vitals filed for this visit. There is no height or weight on file to calculate BMI.   Assessment and Plan: Marland KitchenMarland KitchenAdra was seen today for hypertension, hyperlipidemia and diabetes.  Diagnoses and all orders for this visit:  Type 2 diabetes mellitus with chronic kidney disease, with long-term current use of insulin, unspecified CKD stage (HCC) -     Semaglutide, 1 MG/DOSE, (OZEMPIC, 1 MG/DOSE,) 4 MG/3ML SOPN; Inject  1 mg into the skin once a week.  Statin intolerance -     atorvastatin (LIPITOR) 40 MG tablet; Take 1 tablet (40 mg total) by mouth daily.  Essential hypertension -     olmesartan-hydrochlorothiazide (BENICAR HCT) 20-12.5 MG tablet; Take 1 tablet by mouth daily.  Vitamin D deficiency -     Vitamin D, Ergocalciferol, (DRISDOL) 1.25 MG (50000 UNIT) CAPS capsule;  Take 1 capsule (50,000 Units total) by mouth every 7 (seven) days.  Hyperlipidemia associated with type 2 diabetes mellitus (HCC) -     atorvastatin (LIPITOR) 40 MG tablet; Take 1 tablet (40 mg total) by mouth daily.  Chronic renal insufficiency, stage II (mild)  Severe major depression, single episode, without psychotic features (HCC)   Working on cost effective options for glucose control.  Continue lantus.  Continue novolog for meal time insulin.  Try ozempic.  Continue benicar/HCT for BP control.  Start lipitor for CV risk.   Pt needs in office visit for A1C and to sit down and go over medications. Follow up in 4 weeks.   Follow Up Instructions:    I discussed the assessment and treatment plan with the patient. The patient was provided an opportunity to ask questions and all were answered. The patient agreed with the plan and demonstrated an understanding of the instructions.   The patient was advised to call back or seek an in-person evaluation if the symptoms worsen or if the condition fails to improve as anticipated.    Tandy Gaw, PA-C

## 2021-03-17 DIAGNOSIS — Z789 Other specified health status: Secondary | ICD-10-CM | POA: Insufficient documentation

## 2021-04-07 ENCOUNTER — Encounter: Payer: Self-pay | Admitting: Internal Medicine

## 2021-04-08 ENCOUNTER — Telehealth: Payer: Self-pay

## 2021-04-08 ENCOUNTER — Encounter: Payer: Self-pay | Admitting: Internal Medicine

## 2021-04-08 NOTE — Telephone Encounter (Signed)
Patient called back and is scheduled for appointment on 04/09/21 at 11:20 am.

## 2021-04-08 NOTE — Telephone Encounter (Signed)
Called and left a detailed message for pt to call back advising if she can make the appt 04/09/21 for 11:20.

## 2021-04-09 ENCOUNTER — Other Ambulatory Visit: Payer: Self-pay

## 2021-04-09 ENCOUNTER — Ambulatory Visit (INDEPENDENT_AMBULATORY_CARE_PROVIDER_SITE_OTHER): Payer: 59 | Admitting: Internal Medicine

## 2021-04-09 ENCOUNTER — Encounter: Payer: Self-pay | Admitting: Internal Medicine

## 2021-04-09 VITALS — BP 122/84 | HR 97 | Ht 62.0 in | Wt 252.0 lb

## 2021-04-09 DIAGNOSIS — Z794 Long term (current) use of insulin: Secondary | ICD-10-CM | POA: Diagnosis not present

## 2021-04-09 DIAGNOSIS — E1169 Type 2 diabetes mellitus with other specified complication: Secondary | ICD-10-CM | POA: Diagnosis not present

## 2021-04-09 DIAGNOSIS — E785 Hyperlipidemia, unspecified: Secondary | ICD-10-CM

## 2021-04-09 DIAGNOSIS — E1122 Type 2 diabetes mellitus with diabetic chronic kidney disease: Secondary | ICD-10-CM

## 2021-04-09 LAB — POCT GLYCOSYLATED HEMOGLOBIN (HGB A1C): Hemoglobin A1C: 10.8 % — AB (ref 4.0–5.6)

## 2021-04-09 MED ORDER — TRESIBA FLEXTOUCH 200 UNIT/ML ~~LOC~~ SOPN: 60.0000 [IU] | PEN_INJECTOR | Freq: Every day | SUBCUTANEOUS | 3 refills | Status: DC

## 2021-04-09 MED ORDER — NOVOLOG FLEXPEN 100 UNIT/ML ~~LOC~~ SOPN: 8.0000 [IU] | PEN_INJECTOR | Freq: Three times a day (TID) | SUBCUTANEOUS | 3 refills | Status: DC

## 2021-04-09 NOTE — Progress Notes (Signed)
Patient ID: Jasmine [name], female   DOB: 06/02/1966, 55 y.o.   MRN: [id number]   This visit occurred during the SARS-CoV-2 public health emergency.  Safety protocols were in place, including screening questions prior to the visit, additional usage of staff PPE, and extensive cleaning of exam room while observing appropriate contact time as indicated for disinfecting solutions.   HPI: Jasmine [name] is a 55 y.o.-year-old female, presenting for f/u for DM2, dx in 2012/12/13, but had GDM in 1990/12/14, insulin-dependent, uncontrolled, with complications (CKD, DR).  She has been seen previously by Dr. Everardo All. Last visit 3 months ago.  This appointment with scheduled emergently, after she contacted me yesterday with very high sugars  Interim history: No blurry vision, nausea, chest pain. She has increased urination.  She stopped sodas before last visit. Her husband died of cancer in 12-14-18.  She has a history of depression and goes to behavioral health.  She was previously noncompliant with her diabetic medicines and depression. Sugars started to increase out of control ~2 weeks ago after she changed from [place] to Lantus.  Reviewed HbA1c levels: Lab Results  Component Value Date   HGBA1C 7.3 (A) 12/19/2020   HGBA1C 8.4 (A) 09/19/2020   HGBA1C 11.7 (A) 05/16/2020   Previously on: -  >> stopped 2/2 increased urination -  >> stopped 2/2 GERD, undigested food - Tresiba 20 >> 56 units daily - misses 3 days a week  Currently on: - Tresiba >> Lantus 56 units daily - Novolog: mostly 2x a day >> NOW OFF - Trulicity 1.5 mg weekly-restarted 06/2020 [facility] was not approved yet) >> missed 2 doses did not Ozempic 1  mg weekly as suggested in 12/2020 Metformin >> tongue swelling. Farxiga >> repeated yeast inf's (04/2017).  She checks her sugars more than 4 times a day with her  freestyle libre 2 CGM - not in the last 3 weeks b/c alarms: - am: 140, then 263-353 - 2h after b'fast: n/c - lunch: n/c - 2h after  lunch: 419, >600 - dinner: n/c - 2h after dinner: n/c - bedtime: n/c  Prev.:   Previously:   Lowest sugar was 69 >> 60; >> 125 she has hypoglycemia awareness in the 80s. Highest sugar was 500 >> 260 >> 243 >> 200s >> 197 before changing to Lantus; >600 recently.  Glucometer: FreeStyle  Pt's meals are: - Breakfast: coffee or PB sandwich + coffee - Lunch: chicken, veggies - Dinner: bowl of cereal or skips >> now 10 oz of meat and 2 cups of veggies - Snacks: many: M and M, granola bar, trailmix She was previously going to the weight management clinic, but not recently.  -+ CKD, last BUN/creatinine:  Lab Results  Component Value Date   BUN 11 09/19/2020   BUN 11 11/01/2019   CREATININE 1.25 (H) 09/19/2020   CREATININE 1.36 (H) 11/01/2019  On losartan. -+ HL; last set of lipids: Lab Results  Component Value Date   CHOL 162 09/19/2020   HDL 40.90 09/19/2020   LDLCALC 95 09/19/2020   LDLDIRECT 103.0 09/09/2017   TRIG 132.0 09/19/2020   CHOLHDL 4 09/19/2020  Previously on Zocor 40, changed to Crestor 10 in 09/2020.  - last eye exam was in 06/2020: + DR  - no numbness and tingling in her feet.   Pt has FH of DM in "everybody" - both sides of family: M, F, S's, B's, aunts.  ROS: Constitutional: + weight gain/+ weight loss, no fatigue, no subjective hyperthermia, no  subjective hypothermia, + increased urination Eyes: no blurry vision, no xerophthalmia ENT: no sore throat, no nodules palpated in neck, no dysphagia, no odynophagia, no hoarseness Cardiovascular: no CP/no SOB/no palpitations/no leg swelling Respiratory: no cough/no SOB/no wheezing Gastrointestinal: no N/no V/no D/no C/no acid reflux Musculoskeletal: no muscle aches/no joint aches Skin: no rashes, no hair loss Neurological: no tremors/no numbness/no tingling/no dizziness  I reviewed pt's medications, allergies, PMH, social hx, family hx, and changes were documented in the history of present illness.  Otherwise, unchanged from my initial visit note.  Past Medical History:  Diagnosis Date   Anxiety    Arthritis    "knees" (03/28/2014)   Chronic renal insufficiency    stage II (mild)   Depression    Headache    with high blood sugar   Hypercholesteremia    Hypertension    Numbness and tingling of right arm and leg    Seasonal allergies    Type II diabetes mellitus (HCC)    Umbilical hernia without mention of obstruction or gangrene    Wears glasses    Past Surgical History:  Procedure Laterality Date   INSERTION OF MESH N/A 03/28/2014   Procedure: INSERTION OF MESH;  Surgeon: Axel Filler, MD;  Location: [facility] OR;  Service: General;  Laterality: N/A;   INSERTION OF MESH N/A 05/08/2015   Procedure: INSERTION OF MESH;  Surgeon: Axel Filler, MD;  Location: [facility] OR;  Service: General;  Laterality: N/A;   LAPAROSCOPIC LYSIS OF ADHESIONS N/A 05/08/2015   Procedure: LAPAROSCOPIC LYSIS OF ADHESIONS;  Surgeon: Axel Filler, MD;  Location: [facility] OR;  Service: General;  Laterality: N/A;   MULTIPLE TOOTH EXTRACTIONS  ~ 2003   TONSILLECTOMY     UMBILICAL HERNIA REPAIR  1973   VENTRAL HERNIA REPAIR N/A 03/28/2014   Procedure: LAPAROSCOPIC VENTRAL HERNIA;  Surgeon: Axel Filler, MD;  Location: [facility] OR;  Service: General;  Laterality: N/A;   VENTRAL HERNIA REPAIR N/A 05/08/2015   Procedure: LAPAROSCOPIC VENTRAL HERNIA REPAIR WITH MESH;  Surgeon: Axel Filler, MD;  Location: [facility] OR;  Service: General;  Laterality: N/A;   Social History   Socioeconomic History   Marital status: Widowed    Spouse name: Florencia Reasons   Number of children: 1   Years of education: Not on file   Highest education level: Not on file  Occupational History   Not on file  Tobacco Use   Smoking status: Former    Packs/day: 0.25    Years: 26.00    Pack years: 6.50    Types: Cigarettes   Smokeless tobacco: Never  Vaping Use   Vaping Use: Never used  Substance and Sexual Activity   Alcohol use: Yes     Comment: "have a drink a few times/year"   Drug use: No   Sexual activity: Not Currently  Other Topics Concern   Not on file  Social History Narrative   Not on file   Social Determinants of Health   Financial Resource Strain: Not on file  Food Insecurity: Not on file  Transportation Needs: Not on file  Physical Activity: Not on file  Stress: Not on file  Social Connections: Not on file  Intimate Partner Violence: Not on file   Current Outpatient Medications on File Prior to Visit  Medication Sig Dispense Refill   albuterol (VENTOLIN HFA) 108 (90 Base) MCG/ACT inhaler Inhale 2 puffs into the lungs every 6 (six) hours as needed. 18 g 0   atorvastatin (LIPITOR) 40 MG tablet Take  1 tablet (40 mg total) by mouth daily. 90 tablet 3   Continuous Blood Gluc Sensor (FREESTYLE LIBRE 2 SENSOR) MISC Use as instructed to be changed every 14 days 6 each 1   insulin aspart (NOVOLOG FLEXPEN) 100 UNIT/ML FlexPen Inject 12-20 Units into the skin 3 (three) times daily with meals. 30 mL 3   insulin glargine (LANTUS) 100 UNIT/ML Solostar Pen INJECT 56 UNITS INTO THE SKIN DAILY. (Patient not taking: Reported on 03/10/2021) 30 mL 2   Insulin Pen Needle 32G X 4 MM MISC Use 4x a day 300 each 3   olmesartan-hydrochlorothiazide (BENICAR HCT) 20-12.5 MG tablet Take 1 tablet by mouth daily. 90 tablet 1   Semaglutide, 1 MG/DOSE, (OZEMPIC, 1 MG/DOSE,) 4 MG/3ML SOPN Inject 1 mg into the skin once a week. 9 mL 1   sertraline (ZOLOFT) 100 MG tablet TAKE ONE AND ONE/HALF TABLET DAILY. (Patient not taking: Reported on 03/10/2021) 135 tablet 1   traMADol (ULTRAM) 50 MG tablet Take 1-2 tablets (50-100 mg total) by mouth every 8 (eight) hours as needed for moderate pain. Maximum 6 tabs per day. 21 tablet 0   Vitamin D, Ergocalciferol, (DRISDOL) 1.25 MG (50000 UNIT) CAPS capsule Take 1 capsule (50,000 Units total) by mouth every 7 (seven) days. 12 capsule 1   No current facility-administered medications on file prior to visit.    Allergies  Allergen Reactions   Eggs Or Egg-Derived Products Hives and Other (See Comments)    FLU VACCINE: caused hives     Influenza Vaccines Hives    Vaccine from Egg derived product causes Hives   Crestor [Rosuvastatin]     Made her arm feel like it was numb.   Effexor [Venlafaxine]     "felt weird"   Metformin And Related Swelling    ANGIOEDEMA   Family History  Problem Relation Age of Onset   Diabetes Mother    High Cholesterol Mother    Obesity Mother    Diabetes Father    Cancer Father        Prostate cancer   Cancer Sister        Cervical cancer   Diabetes Brother    Bipolar disorder Brother    Alcohol abuse Brother    Drug abuse Brother    Diabetes Sister    Diabetes Brother     PE: BP 122/84   Pulse 97   Ht 5\' 2"  (1.575 m)   Wt 252 lb (114.3 kg)   SpO2 99%   BMI 46.09 kg/m  Wt Readings from Last 3 Encounters:  04/09/21 252 lb (114.3 kg)  12/19/20 251 lb 9.6 oz (114.1 kg)  11/13/20 240 lb (108.9 kg)   Constitutional: overweight, in NAD Eyes: PERRLA, EOMI, no exophthalmos ENT: moist mucous membranes, no thyromegaly, no cervical lymphadenopathy Cardiovascular: Tachycardia, RR, No MRG Respiratory: CTA B Gastrointestinal: abdomen soft, NT, ND, BS+ Musculoskeletal: no deformities, strength intact in all 4 Skin: moist, warm, no rashes Neurological: no tremor with outstretched hands, DTR normal in all 4  ASSESSMENT: 1. DM2, insulin-dependent, uncontrolled, with complications - CKD stage 2 - DR  2. HL  3. Obesity class III  PLAN:  1. Patient with, uncontrolled, type 2 diabetes, on basal-bolus insulin regimen, and also weekly GLP-1 receptor agonist.  She had GI symptoms with Trulicity in the past, but these were not prominent after she restarted it.  She was on 1.5 mg of Trulicity, then started [place], but this stopped being covered so at last visit I  suggested to start Ozempic.  This was approved with a PA since then.  She was on SGLT2 inhibitors  in the past [place](Farxiga) but she came off due to increased urination.  Retrospectively, this could have been caused by glycosuria in the setting of uncontrolled diabetes. -At last visit, reviewing the CGM trends, sugars are well controlled overnight but more fluctuating.  She had occasional hyperglycemic spikes after lunch and dinner but the majority of her blood sugars were still within range.  She had occasional lower blood sugars, in the 60s, before dinner.  At that time, I suggested to switch to Ozempic but did not change the rest of her regimen.  I advised her to decrease the doses of NovoLog if sugars decreased after starting Ozempic.  At this visit, she tells me that she did not start Ozempic yet, she is still on lower dose Trulicity... -At today's visit, she does not have the CGM attached due to the very frequent alarms, that was bothering her and her coworkers.  She is checking her sugars manually and they are quite high, between 200 and >600 per review of her meter.she contacted me few days ago with high blood sugars after she had to switch from [place] to Lantus due to insurance coverage.  We sent a preauthorization form for [place] today.  I also gave her a sample pen of Toujeo and advised her to take a higher dose.  I advised her to start Ozempic right away, which is stronger than the 1.5 mg of Trulicity.  Also, upon questioning, she is off NovoLog as sugars improved after last visit and she did not restart this given when the sugars started to worsen.  I advised her to restart on the NovoLog, I am hoping provisionally, and to vary the dose between 8 to 14 units before meals. - I suggested to:  Patient Instructions  Please change: - Lantus to Toujeo 60 units daily  Start: - Ozempic 1 mg weekly  Please use: - Novolog: - 8-14 units before meals  Please return in 3 months. - we checked her HbA1c: 10.8% (much higher) - advised to check sugars at different times of the day - 4x a day, rotating  check times - advised for yearly eye exams >> she is UTD - return to clinic in 3 months  2. HL -Reviewed latest lipid panel from 09/2020:LDL above target, rest of the fractions at goal: Lab Results  Component Value Date   CHOL 162 09/19/2020   HDL 40.90 09/19/2020   LDLCALC 95 09/19/2020   LDLDIRECT 103.0 09/09/2017   TRIG 132.0 09/19/2020   CHOLHDL 4 09/19/2020  -We changed from Zocor 40 mg daily to Crestor 10 mg daily after the above results return.  3. Obesity class III -We tried SGLT2 inhibitors, but she could not tolerate these.  Currently on GLP-1 receptor agonist, which should also help with weight loss. -[facility] but this was not approved by her insurance  -At last visit, I advised her to switch from Trulicity to [employer].  A PA for this was approved.  She did not start it... However, she got it from the pharmacy and I advised her to start it right away. -She gained 1 pound since last visit   Carlus Pavlovristina Tamu Golz, MD PhD [facility]

## 2021-04-09 NOTE — Addendum Note (Signed)
Addended by: Shelly Bombard on: 04/09/2021 01:15 PM   Modules accepted: Orders

## 2021-04-09 NOTE — Patient Instructions (Addendum)
Please change: - Lantus to Toujeo 60 units daily  Start: - Ozempic 1 mg weekly  Please use: - Novolog: - 8-14 units before meals  Please return in 3 months.

## 2021-04-10 ENCOUNTER — Telehealth: Payer: Self-pay | Admitting: Pharmacy Technician

## 2021-04-10 ENCOUNTER — Other Ambulatory Visit (HOSPITAL_COMMUNITY): Payer: Self-pay

## 2021-04-10 NOTE — Telephone Encounter (Signed)
Patient Advocate Encounter   Received notification from MD office that prior authorization for Evaristo Bury is required.   PA submitted on 04/10/21 Key [id number] Status is pending    [facility] will continue to follow:   Sherilyn Dacosta, CPhT Patient Advocate [facility] Phone: [phone] Fax:  [phone]

## 2021-04-17 ENCOUNTER — Telehealth: Payer: Self-pay | Admitting: Pharmacy Technician

## 2021-04-17 ENCOUNTER — Other Ambulatory Visit: Payer: Self-pay | Admitting: Internal Medicine

## 2021-04-17 MED ORDER — TOUJEO MAX SOLOSTAR 300 UNIT/ML ~~LOC~~ SOPN: PEN_INJECTOR | SUBCUTANEOUS | 3 refills | Status: DC

## 2021-04-17 NOTE — Telephone Encounter (Signed)
[facility] Patient Advocate Encounter  Received notification from [employer] that the request for prior authorization for Jasmine [name] has been denied due to not being a covered benefit under her drug plan. It does not give a list of what is covered. Unable to do a test claim at this time to see what may be covered.

## 2021-04-17 NOTE — Telephone Encounter (Signed)
I gave her a sample of Toujeo at the time of the visit.  Since Evaristo Bury is not covered, I called in Toujeo to her mail order pharmacy.

## 2021-04-21 ENCOUNTER — Other Ambulatory Visit (HOSPITAL_COMMUNITY): Payer: Self-pay

## 2021-04-21 ENCOUNTER — Encounter: Payer: Self-pay | Admitting: Internal Medicine

## 2021-04-21 DIAGNOSIS — E1122 Type 2 diabetes mellitus with diabetic chronic kidney disease: Secondary | ICD-10-CM

## 2021-04-21 DIAGNOSIS — Z794 Long term (current) use of insulin: Secondary | ICD-10-CM

## 2021-04-21 MED ORDER — TOUJEO MAX SOLOSTAR 300 UNIT/ML ~~LOC~~ SOPN: PEN_INJECTOR | SUBCUTANEOUS | 3 refills | Status: DC

## 2021-04-21 NOTE — Telephone Encounter (Signed)
[facility] Patient Advocate Encounter  Received notification from CoverMyMeds that the request for prior authorization for Jasmine [name] has been denied due to not meeting PA requirements.

## 2021-04-24 MED ORDER — TOUJEO MAX SOLOSTAR 300 UNIT/ML ~~LOC~~ SOPN: PEN_INJECTOR | SUBCUTANEOUS | 3 refills | Status: DC

## 2021-04-24 NOTE — Addendum Note (Signed)
Addended by: Kenyon Ana on: 04/24/2021 07:50 AM   Modules accepted: Orders

## 2021-05-16 ENCOUNTER — Other Ambulatory Visit: Payer: Self-pay | Admitting: Physician Assistant

## 2021-05-16 DIAGNOSIS — E559 Vitamin D deficiency, unspecified: Secondary | ICD-10-CM

## 2021-08-05 ENCOUNTER — Encounter: Payer: Self-pay | Admitting: Internal Medicine

## 2021-08-05 DIAGNOSIS — E1122 Type 2 diabetes mellitus with diabetic chronic kidney disease: Secondary | ICD-10-CM

## 2021-08-05 MED ORDER — FREESTYLE LIBRE 2 SENSOR MISC: 0 refills | Status: DC

## 2021-09-18 ENCOUNTER — Encounter: Payer: Self-pay | Admitting: Internal Medicine

## 2021-09-18 ENCOUNTER — Ambulatory Visit: Payer: 59 | Admitting: Internal Medicine

## 2021-09-18 ENCOUNTER — Other Ambulatory Visit: Payer: Self-pay

## 2021-09-18 VITALS — BP 140/88 | HR 98 | Ht 62.0 in | Wt 254.8 lb

## 2021-09-18 DIAGNOSIS — E785 Hyperlipidemia, unspecified: Secondary | ICD-10-CM | POA: Diagnosis not present

## 2021-09-18 DIAGNOSIS — E1122 Type 2 diabetes mellitus with diabetic chronic kidney disease: Secondary | ICD-10-CM

## 2021-09-18 DIAGNOSIS — E1169 Type 2 diabetes mellitus with other specified complication: Secondary | ICD-10-CM | POA: Diagnosis not present

## 2021-09-18 DIAGNOSIS — Z794 Long term (current) use of insulin: Secondary | ICD-10-CM | POA: Diagnosis not present

## 2021-09-18 LAB — COMPREHENSIVE METABOLIC PANEL
ALT: 13 U/L (ref 0–35)
AST: 12 U/L (ref 0–37)
Albumin: 4.1 g/dL (ref 3.5–5.2)
Alkaline Phosphatase: 64 U/L (ref 39–117)
BUN: 15 mg/dL (ref 6–23)
CO2: 31 mEq/L (ref 19–32)
Calcium: 9.8 mg/dL (ref 8.4–10.5)
Chloride: 103 mEq/L (ref 96–112)
Creatinine, Ser: 1.2 mg/dL (ref 0.40–1.20)
GFR: 51.48 mL/min — ABNORMAL LOW (ref >60.00)
Glucose, Bld: 72 mg/dL (ref 70–99)
Potassium: 4.7 mEq/L (ref 3.5–5.1)
Sodium: 140 mEq/L (ref 135–145)
Total Bilirubin: 0.3 mg/dL (ref 0.2–1.2)
Total Protein: 7 g/dL (ref 6.0–8.3)

## 2021-09-18 LAB — LIPID PANEL
Cholesterol: 182 mg/dL (ref 0–200)
HDL: 44.9 mg/dL (ref >39.00)
LDL Cholesterol: 110 mg/dL — ABNORMAL HIGH (ref 0–99)
NonHDL: 137.35
Total CHOL/HDL Ratio: 4
Triglycerides: 136 mg/dL (ref 0.0–149.0)
VLDL: 27.2 mg/dL (ref 0.0–40.0)

## 2021-09-18 LAB — POCT GLYCOSYLATED HEMOGLOBIN (HGB A1C): Hemoglobin A1C: 7.6 % — AB (ref 4.0–5.6)

## 2021-09-18 LAB — MICROALBUMIN / CREATININE URINE RATIO
Creatinine,U: 66.2 mg/dL
Microalb Creat Ratio: 1.1 mg/g (ref 0.0–30.0)
Microalb, Ur: <0.7 mg/dL (ref 0.0–1.9)

## 2021-09-18 MED ORDER — FREESTYLE LIBRE 2 SENSOR MISC: 3 refills | Status: DC

## 2021-09-18 MED ORDER — OZEMPIC (1 MG/DOSE) 4 MG/3ML ~~LOC~~ SOPN: 1.0000 mg | PEN_INJECTOR | SUBCUTANEOUS | 1 refills | Status: DC

## 2021-09-18 NOTE — Patient Instructions (Addendum)
Please decrease: - Toujeo 54 units daily  Continue: - Ozempic 1 mg weekly  Please return in 3 month.

## 2021-09-18 NOTE — Progress Notes (Addendum)
Patient ID: Jasmine [name], female   DOB: 06/15/1966, 56 y.o.   MRN: [id number]   This visit occurred during the SARS-CoV-2 public health emergency.  Safety protocols were in place, including screening questions prior to the visit, additional usage of staff PPE, and extensive cleaning of exam room while observing appropriate contact time as indicated for disinfecting solutions.   HPI: Jasmine [name] is a 56 y.o.-year-old female, presenting for f/u for DM2, dx in 14-Dec-2012, but had GDM in Dec 15, 1990, insulin-dependent, uncontrolled, with complications (CKD, DR).  She has been seen previously by Dr. Loanne Drilling. Last visit 6 months ago.  Interim history: She has increased urination but no blurry vision, nausea, chest pain Her husband died of cancer in 15-Dec-2018.  She has a history of depression and goes to behavioral health.  She has a history of being noncompliant with diabetic medications. Additionally, she describes that her sugars improved after starting Ozempic.  Reviewed HbA1c levels: Lab Results  Component Value Date   HGBA1C 10.8 (A) 04/09/2021   HGBA1C 7.3 (A) 12/19/2020   HGBA1C 8.4 (A) 09/19/2020   Previously on: -  >> stopped 2/2 increased urination -  >> stopped 2/2 GERD, undigested food - Tresiba 20 >> 56 units daily - misses 3 days a week  Currently on: - Tresiba >> Lantus 56 >> Toujeo 60 units daily ->> stopped 07/2021 2/2 improvement in sugars - Trulicity 1.5 mg weekly >> Ozempic 1  mg weekly - started 03/2021 Metformin >> tongue swelling. Farxiga >> repeated yeast inf's (04/2017).  She checks her sugars more than 4 times a day with her  freestyle libre 2 CGM:   Prev.: - am: 140, then 263-353 - 2h after b'fast: n/c - lunch: n/c - 2h after lunch: 419, >600 - dinner: n/c - 2h after dinner: n/c - bedtime: n/c  Lowest sugar was 69 >> 60 >> 125 >> 50s;  she has hypoglycemia awareness in the 80s. Highest sugar was 500... >>200s >> 197 before changing to Lantus; >600 >>  200s.  Glucometer: FreeStyle  Pt's meals are: - Breakfast: coffee or PB sandwich + coffee - Lunch: chicken, veggies - Dinner: bowl of cereal or skips >> now 10 oz of meat and 2 cups of veggies - Snacks: many: M and M, granola bar, trailmix She was previously going to the weight management clinic, but not recently.  -+ CKD, last BUN/creatinine:  Lab Results  Component Value Date   BUN 11 09/19/2020   BUN 11 11/01/2019   CREATININE 1.25 (H) 09/19/2020   CREATININE 1.36 (H) 11/01/2019  On losartan. -+ HL; last set of lipids: Lab Results  Component Value Date   CHOL 162 09/19/2020   HDL 40.90 09/19/2020   LDLCALC 95 09/19/2020   LDLDIRECT 103.0 09/09/2017   TRIG 132.0 09/19/2020   CHOLHDL 4 09/19/2020  Previously on Zocor 40, changed to Crestor 10 in 09/2020.  - last eye exam was in 06/2020: + DR  - no numbness and tingling in her feet.   Pt has FH of DM in "everybody" - both sides of family: M, F, S's, B's, aunts.  ROS: + see HPI  I reviewed pt's medications, allergies, PMH, social hx, family hx, and changes were documented in the history of present illness. Otherwise, unchanged from my initial visit note.  Past Medical History:  Diagnosis Date   Anxiety    Arthritis    "knees" (03/28/2014)   Chronic renal insufficiency    stage II (mild)   Depression  Headache    with high blood sugar   Hypercholesteremia    Hypertension    Numbness and tingling of right arm and leg    Seasonal allergies    Type II diabetes mellitus ([place])    Umbilical hernia without mention of obstruction or gangrene    Wears glasses    Past Surgical History:  Procedure Laterality Date   INSERTION OF MESH N/A 03/28/2014   Procedure: INSERTION OF MESH;  Surgeon: Ralene Ok, MD;  Location: [facility];  Service: General;  Laterality: N/A;   INSERTION OF MESH N/A 05/08/2015   Procedure: INSERTION OF MESH;  Surgeon: Ralene Ok, MD;  Location: [facility];  Service: General;  Laterality: N/A;    LAPAROSCOPIC LYSIS OF ADHESIONS N/A 05/08/2015   Procedure: LAPAROSCOPIC LYSIS OF ADHESIONS;  Surgeon: Ralene Ok, MD;  Location: [facility];  Service: General;  Laterality: N/A;   MULTIPLE TOOTH EXTRACTIONS  ~ 2003   [place] HERNIA REPAIR N/A 03/28/2014   Procedure: LAPAROSCOPIC VENTRAL HERNIA;  Surgeon: Ralene Ok, MD;  Location: [facility];  Service: General;  Laterality: N/A;   VENTRAL HERNIA REPAIR N/A 05/08/2015   Procedure: LAPAROSCOPIC VENTRAL HERNIA REPAIR WITH MESH;  Surgeon: Ralene Ok, MD;  Location: [facility];  Service: General;  Laterality: N/A;   Social History   Socioeconomic History   Marital status: Widowed    Spouse name: Jasmine [name]   Number of children: 1   Years of education: Not on file   Highest education level: Not on file  Occupational History   Not on file  Tobacco Use   Smoking status: Former    Packs/day: 0.25    Years: 26.00    Pack years: 6.50    Types: Cigarettes   Smokeless tobacco: Never  Vaping Use   Vaping Use: Never used  Substance and Sexual Activity   Alcohol use: Yes    Comment: "have a drink a few times/year"   Drug use: No   Sexual activity: Not Currently  Other Topics Concern   Not on file  Social History Narrative   Not on file   Social Determinants of Health   Financial Resource Strain: Not on file  Food Insecurity: Not on file  Transportation Needs: Not on file  Physical Activity: Not on file  Stress: Not on file  Social Connections: Not on file  Intimate Partner Violence: Not on file   Current Outpatient Medications on File Prior to Visit  Medication Sig Dispense Refill   albuterol (VENTOLIN HFA) 108 (90 Base) MCG/ACT inhaler Inhale 2 puffs into the lungs every 6 (six) hours as needed. 18 g 0   atorvastatin (LIPITOR) 40 MG tablet Take 1 tablet (40 mg total) by mouth daily. 90 tablet 3   Continuous Blood Gluc Sensor (FREESTYLE LIBRE 2 SENSOR) MISC Use as instructed to  be changed every 14 days 2 each 0   insulin aspart (NOVOLOG FLEXPEN) 100 UNIT/ML FlexPen Inject 8-14 Units into the skin 3 (three) times daily with meals. 30 mL 3   insulin glargine, 2 Unit Dial, (TOUJEO MAX SOLOSTAR) 300 UNIT/ML Solostar Pen Inject 60 units of Toujeo under skin daily 18 mL 3   Insulin Pen Needle 32G X 4 MM MISC Use 4x a day 300 each 3   olmesartan-hydrochlorothiazide (BENICAR HCT) 20-12.5 MG tablet Take 1 tablet by mouth daily. 90 tablet 1   Semaglutide, 1 MG/DOSE, (OZEMPIC, 1 MG/DOSE,) 4 MG/3ML SOPN  Inject 1 mg into the skin once a week. 9 mL 1   sertraline (ZOLOFT) 100 MG tablet TAKE ONE AND ONE/HALF TABLET DAILY. 135 tablet 1   traMADol (ULTRAM) 50 MG tablet Take 1-2 tablets (50-100 mg total) by mouth every 8 (eight) hours as needed for moderate pain. Maximum 6 tabs per day. 21 tablet 0   Vitamin D, Ergocalciferol, (DRISDOL) 1.25 MG (50000 UNIT) CAPS capsule Take 1 capsule (50,000 Units total) by mouth every 7 (seven) days. 12 capsule 1   No current facility-administered medications on file prior to visit.   Allergies  Allergen Reactions   Eggs Or Egg-Derived Products Hives and Other (See Comments)    FLU VACCINE: caused hives     Influenza Vaccines Hives    Vaccine from Egg derived product causes Hives   Crestor [Rosuvastatin]     Made her arm feel like it was numb.   Effexor [Venlafaxine]     "felt weird"   Lantus [Insulin Glargine] Other (See Comments)    NOT EFFECTIVE >> sugars increase to >600!   Metformin And Related Swelling    ANGIOEDEMA   Family History  Problem Relation Age of Onset   Diabetes Mother    High Cholesterol Mother    Obesity Mother    Diabetes Father    Cancer Father        Prostate cancer   Cancer Sister        Cervical cancer   Diabetes Brother    Bipolar disorder Brother    Alcohol abuse Brother    Drug abuse Brother    Diabetes Sister    Diabetes Brother     PE: BP 140/88 (BP Location: Right Arm, Patient Position: Sitting,  Cuff Size: Normal)    Pulse 98    Ht 5\' 2"  (1.575 m)    Wt 254 lb 12.8 oz (115.6 kg)    SpO2 97%    BMI 46.60 kg/m  Wt Readings from Last 3 Encounters:  09/18/21 254 lb 12.8 oz (115.6 kg)  04/09/21 252 lb (114.3 kg)  12/19/20 251 lb 9.6 oz (114.1 kg)   Constitutional: overweight, in NAD Eyes: PERRLA, EOMI, no exophthalmos ENT: moist mucous membranes, no thyromegaly, no cervical lymphadenopathy Cardiovascular: Tachycardia, RR, No MRG Respiratory: CTA B Musculoskeletal: no deformities, strength intact in all 4 Skin: moist, warm, no rashes Neurological: no tremor with outstretched hands, DTR normal in all 4  ASSESSMENT: 1. DM2, insulin-dependent, uncontrolled, with complications - CKD stage 2 - DR  2. HL  3. Obesity class III  PLAN:  1. Patient with uncontrolled type 2 diabetes, on a basal/bolus insulin regimen and also weekly GLP-1 receptor agonist.  At that time she was not taking Ozempic yet, but still on the lower dose of Trulicity. -She had GI symptoms with Trulicity in the past, but these were not prominent after she restarted it.  She was on 1.5 mg of Trulicity, then started [place], but this stopped being covered so  I suggested to start Ozempic.  This was approved with a PA.  At last visit she was not on this and I advised her to start.  -She was on SGLT2 inhibitors in the past [place]) but she came off due to increased urination.  Retrospectively, this could have been caused by glycosuria in the setting of uncontrolled diabetes. -At last visit, she did not have her CGM attached 2/2 the frequent alarms that were bothering her and her coworkers.  She was taking manually and sugars  were very high, between 200- >600.... Upon questioning, she was off NovoLog and I strongly advised her to start again.  I also advised her to switch from Trulicity 1.5 mg weekly to Ozempic 1 mg weekly.  Since she mentioned that her sugars worsened after switching from Lantus to [place], I suggested to try  again [place], but a PA was denied for her so we switched to Toujeo instead.  HbA1c at last visit was higher, at 10.8%. -At this visit, she again has a CGM attached.  She was also able to restart Ozempic.  She describes that her sugars improved so much that she had to stop NovoLog approximately 2 months ago. CGM interpretation: -At today's visit, we reviewed her CGM downloads: It appears that 85% of values are in target range (goal >70%), while 13% are higher than 180 (goal <25%), and 2% are lower than 70 (goal <4%).  The calculated average blood sugar is 134.  The projected HbA1c for the next 3 months ([facility]MI) is 6.5%. -Reviewing the CGM trends, sugars appear to be mostly at goal at night and increasing mostly after lunch and less so after dinner.  However, reviewing individual daily tracings, she appears to have low blood sugars in the 50s and 60s, and the hyperglycemic peaks appear to be after she compensates for these levels.  Therefore, at today's visit, I suggested to decrease the dose of Toujeo and did not add back NovoLog.  We will continue Ozempic, which she tolerates well. - I congratulated her for the improvement of her diabetes control. - I suggested to:  Patient Instructions  Please decrease: - Toujeo 54 units daily  Continue: - Ozempic 1 mg weekly  Please return in 3 month.  - we checked her HbA1c: 7.6% (much improved) - advised to check sugars at different times of the day - 4x a day, rotating check times - advised for yearly eye exams >> she is not UTD - she is due for annual labs-we will check them today - return to clinic in 3 months  2. HL -Reviewed latest lipid panel from 09/2020: LDL above target, the rest of the fractions at goal: Lab Results  Component Value Date   CHOL 162 09/19/2020   HDL 40.90 09/19/2020   LDLCALC 95 09/19/2020   LDLDIRECT 103.0 09/09/2017   TRIG 132.0 09/19/2020   CHOLHDL 4 09/19/2020  -Currently on Lipitor 40 mg daily - will check a lipid  panel today.  She did not eat lunch yet.  3. Obesity class III -We tried SGLT2 inhibitors but she could not tolerate these. -Mancel Parsons was not approved by her insurance in the past -At last visit, she did not start [place] yet, despite the fact that a PA was approved for her.  I advised her to start.  This should also help with weight loss.  She was able to start since last visit.   -She gained 1 pound before last visit and 2 pounds since then  Component     Latest Ref Rng & Units 09/18/2021  Sodium     135 - 145 mEq/L 140  Potassium     3.5 - 5.1 mEq/L 4.7  Chloride     96 - 112 mEq/L 103  CO2     19 - 32 mEq/L 31  Glucose     70 - 99 mg/dL 72  BUN     6 - 23 mg/dL 15  Creatinine     0.40 - 1.20 mg/dL 1.20  Total  Bilirubin     0.2 - 1.2 mg/dL 0.3  Alkaline Phosphatase     39 - 117 U/L 64  AST     0 - 37 U/L 12  ALT     0 - 35 U/L 13  Total Protein     6.0 - 8.3 g/dL 7.0  Albumin     3.5 - 5.2 g/dL 4.1  GFR     >60.00 mL/min 51.48 (L)  Calcium     8.4 - 10.5 mg/dL 9.8  Cholesterol     0 - 200 mg/dL 182  Triglycerides     0.0 - 149.0 mg/dL 136.0  HDL Cholesterol     >39.00 mg/dL 44.90  VLDL     0.0 - 40.0 mg/dL 27.2  LDL (calc)     0 - 99 mg/dL 110 (H)  Total CHOL/HDL Ratio      4  NonHDL      137.35  Microalb, Ur     0.0 - 1.9 mg/dL <0.7  Creatinine,U     mg/dL 66.2  MICROALB/CREAT RATIO     0.0 - 30.0 mg/g 1.1  LDL is above target.  We will check with her if she is taking Lipitor consistently.  If so, I would suggest to change to Crestor 20 mg daily.  Philemon Kingdom, MD PhD [facility]

## 2021-09-19 ENCOUNTER — Telehealth: Payer: Self-pay

## 2021-09-19 ENCOUNTER — Other Ambulatory Visit (HOSPITAL_COMMUNITY): Payer: Self-pay

## 2021-09-19 NOTE — Telephone Encounter (Signed)
Patient Advocate Encounter   Received notification from [facility] that prior authorization for Ozempic is required by his/her insurance [employer]   PA submitted on 09/19/21  Key#: [id number]  Status is pending    [facility] will continue to follow:  Patient Advocate Fax: [phone]

## 2021-09-29 NOTE — Telephone Encounter (Signed)
Patient Advocate Encounter  PA has been cancelled due to previous approval FROM 5.19.22 to 5.19.23. [id number].

## 2021-10-06 ENCOUNTER — Encounter: Payer: Self-pay | Admitting: Physician Assistant

## 2021-10-06 ENCOUNTER — Encounter: Payer: Self-pay | Admitting: Internal Medicine

## 2021-10-06 NOTE — Telephone Encounter (Signed)
Pt has been scheduled for virtual visit for tomorrow. AM

## 2021-10-07 ENCOUNTER — Telehealth (INDEPENDENT_AMBULATORY_CARE_PROVIDER_SITE_OTHER): Payer: 59 | Admitting: Family Medicine

## 2021-10-07 ENCOUNTER — Encounter: Payer: Self-pay | Admitting: Family Medicine

## 2021-10-07 VITALS — Ht 62.0 in | Wt 250.0 lb

## 2021-10-07 DIAGNOSIS — J209 Acute bronchitis, unspecified: Secondary | ICD-10-CM | POA: Diagnosis not present

## 2021-10-07 MED ORDER — HYDROCODONE BIT-HOMATROP MBR 5-1.5 MG/5ML PO SOLN: 5.0000 mL | Freq: Every evening | ORAL | 0 refills | Status: DC | PRN

## 2021-10-07 MED ORDER — PREDNISONE 20 MG PO TABS: 20.0000 mg | ORAL_TABLET | Freq: Every day | ORAL | 0 refills | Status: DC

## 2021-10-07 NOTE — Patient Instructions (Signed)
Ok to use butyryl 2 puffs every 4-6 hours as needed. I did send over low-dose prednisone to see if that is helpful but not bump your sugars so much.  Just make sure you are eating really healthy while you are taking the prednisone and drinking plenty of fluid.

## 2021-10-07 NOTE — Progress Notes (Signed)
Virtual Visit via Video Note  I connected with Jasmine [name] on 10/07/21 at 10:10 AM EST by a video enabled telemedicine application and verified that I am speaking with the correct person using two identifiers.   I discussed the limitations of evaluation and management by telemedicine and the availability of in person appointments. The patient expressed understanding and agreed to proceed.  Patient location: at home Provider location: in office  Subjective:    CC:   Chief Complaint  Patient presents with   Cough    Productive cough, wheezing and shortness of breath, bodyaches, fever, fatigue and chills, 4 days. Covid test negative 3 days ago.     HPI:  Productive cough, wheezing and shortness of breath, bodyaches, fatigue and chills, 4 days. Covid test negative 3 days ago.  She does not think that she has had a fever she said she just had some chills on Sunday that likely lasted about 5 minutes and that was it.  No sore throat.  No GI symptoms.  She has been mostly relying on Mucinex DM and Flonase.  Her left eye has been draining and she has been doing some hot compresses on that.  She has some mild sinus congestion but she is really not getting a lot of excess mucus from the nose it is mostly being coughed up from her chest.  She does feel like it is deeper in her chest and is very productive.  She has an inhaler and has been using it a couple times a day.  He had similar symptoms last April.  He says right now her diabetes is not in good control.    Past medical history, Surgical history, Family history not pertinant except as noted below, Social history, Allergies, and medications have been entered into the medical record, reviewed, and corrections made.    Objective:    General: Speaking clearly in complete sentences without any shortness of breath.  Alert and oriented x3.  Normal judgment. No apparent acute distress.    Impression and Recommendations:    Problem  List Items Addressed This Visit   None Visit Diagnoses     Acute bronchitis, unspecified organism    -  Primary   Relevant Medications   predniSONE (DELTASONE) 20 MG tablet   HYDROcodone bit-homatropine (HYCODAN) 5-1.5 MG/5ML syrup      Acute bronchitis-she really is predominantly having chest symptoms and no significant sinus symptoms at this point. Reassured this is viral.  We will go ahead and treat with a low-dose of prednisone since she says that her blood sugar have not been well controlled recently.  Also given hydrocodone cough syrup to take at night if the cough is keeping her awake and becomes more severe.  Recommend she continue using her albuterol she can use every 4 hours as needed instead of every 6 hours as what is written on the prescription and we can always refill if needed.  If she is not feeling better towards the end of the week or if she feels like she is starting develop more sinus symptoms then we can always consider a trial of antibiotics at that point if needed.  No orders of the defined types were placed in this encounter.   Meds ordered this encounter  Medications   predniSONE (DELTASONE) 20 MG tablet    Sig: Take 1 tablet (20 mg total) by mouth daily with breakfast.    Dispense:  5 tablet    Refill:  0  HYDROcodone bit-homatropine (HYCODAN) 5-1.5 MG/5ML syrup    Sig: Take 5-10 mLs by mouth at bedtime as needed for cough.    Dispense:  60 mL    Refill:  0     I discussed the assessment and treatment plan with the patient. The patient was provided an opportunity to ask questions and all were answered. The patient agreed with the plan and demonstrated an understanding of the instructions.   The patient was advised to call back or seek an in-person evaluation if the symptoms worsen or if the condition fails to improve as anticipated.   Beatrice Lecher, MD

## 2021-10-09 ENCOUNTER — Encounter: Payer: Self-pay | Admitting: Physician Assistant

## 2021-10-10 MED ORDER — AZITHROMYCIN 250 MG PO TABS: ORAL_TABLET | ORAL | 0 refills | Status: DC

## 2021-11-28 ENCOUNTER — Other Ambulatory Visit (HOSPITAL_COMMUNITY): Payer: Self-pay

## 2021-12-09 ENCOUNTER — Other Ambulatory Visit (HOSPITAL_COMMUNITY): Payer: Self-pay

## 2021-12-10 ENCOUNTER — Other Ambulatory Visit (HOSPITAL_COMMUNITY): Payer: Self-pay

## 2021-12-12 ENCOUNTER — Ambulatory Visit: Payer: 59 | Admitting: Internal Medicine

## 2021-12-22 ENCOUNTER — Ambulatory Visit: Payer: 59 | Admitting: Sports Medicine

## 2021-12-22 ENCOUNTER — Telehealth: Payer: Self-pay | Admitting: Physician Assistant

## 2021-12-22 ENCOUNTER — Ambulatory Visit (INDEPENDENT_AMBULATORY_CARE_PROVIDER_SITE_OTHER): Payer: 59

## 2021-12-22 ENCOUNTER — Other Ambulatory Visit (HOSPITAL_COMMUNITY): Payer: Self-pay

## 2021-12-22 ENCOUNTER — Telehealth: Payer: Self-pay | Admitting: Pharmacy Technician

## 2021-12-22 DIAGNOSIS — M170 Bilateral primary osteoarthritis of knee: Secondary | ICD-10-CM | POA: Diagnosis not present

## 2021-12-22 MED ORDER — MELOXICAM 7.5 MG PO TABS: 7.5000 mg | ORAL_TABLET | Freq: Every day | ORAL | 3 refills | Status: DC

## 2021-12-22 NOTE — Telephone Encounter (Signed)
Patient Advocate Encounter ?  ?Received notification from CoverMyMeds that prior authorization for [employer] 2 sensors is due for renewal. ?  ?Per test claim, too soon to fill. Archived PA for now, not needed.  ? ?Key: [id number] ? ?  ?

## 2021-12-22 NOTE — Telephone Encounter (Signed)
Patient called and said the prescription from today was sent to the wrong pharmacy. Can we have it sent to [facility] at [address] [place], [place] ?

## 2021-12-22 NOTE — Progress Notes (Signed)
? ? ?  Procedures performed today:   ? ?Procedure: Real-time Ultrasound Guided injection of the left knee ?Device: Samsung HS60  ?Verbal informed consent obtained.  ?Time-out conducted.  ?Noted no overlying erythema, induration, or other signs of local infection.  ?Skin prepped in a sterile fashion.  ?Local anesthesia: Topical Ethyl chloride.  ?With sterile technique and under real time ultrasound guidance: Moderate effusion noted, 1 cc Kenalog 40, 2 cc lidocaine, 2 cc bupivacaine injected easily ?Completed without difficulty  ?Advised to call if fevers/chills, erythema, induration, drainage, or persistent bleeding.  ?Images permanently stored and available for review in PACS.  ?Impression: Technically successful ultrasound guided injection. ? ?Independent interpretation of notes and tests performed by another provider:  ? ?None. ? ?Brief History, Exam, Impression, and Recommendations:   ? ?Primary osteoarthritis of both knees ?This is a very pleasant 56 year old female, chronic knee pain, known osteoarthritis, last injection was in June 2022. ?Now having recurrence of pain left knee. ?Injected today, adding some low-dose meloxicam, return to see me as needed. ? ? ? ?___________________________________________ ?Ihor Austin. Benjamin Stain, M.D., ABFM., CAQSM. ?Primary Care and Sports Medicine ?[facility] ? ?Adjunct Instructor of Family Medicine  ?[employer] ?

## 2021-12-22 NOTE — Assessment & Plan Note (Signed)
This is a very pleasant 56 year old female, chronic knee pain, known osteoarthritis, last injection was in June 2022. ?Now having recurrence of pain left knee. ?Injected today, adding some low-dose meloxicam, return to see me as needed. ?

## 2021-12-23 ENCOUNTER — Other Ambulatory Visit: Payer: Self-pay

## 2021-12-23 DIAGNOSIS — M170 Bilateral primary osteoarthritis of knee: Secondary | ICD-10-CM

## 2021-12-23 MED ORDER — MELOXICAM 7.5 MG PO TABS: 7.5000 mg | ORAL_TABLET | Freq: Every day | ORAL | 3 refills | Status: DC

## 2021-12-23 NOTE — Telephone Encounter (Signed)
Rx resent to correct pharmacy

## 2022-01-31 ENCOUNTER — Other Ambulatory Visit: Payer: Self-pay | Admitting: Internal Medicine

## 2022-01-31 DIAGNOSIS — E1122 Type 2 diabetes mellitus with diabetic chronic kidney disease: Secondary | ICD-10-CM

## 2022-04-08 ENCOUNTER — Other Ambulatory Visit: Payer: Self-pay | Admitting: Internal Medicine

## 2022-04-08 DIAGNOSIS — E1122 Type 2 diabetes mellitus with diabetic chronic kidney disease: Secondary | ICD-10-CM

## 2022-05-08 ENCOUNTER — Ambulatory Visit (INDEPENDENT_AMBULATORY_CARE_PROVIDER_SITE_OTHER): Payer: 59 | Admitting: Sports Medicine

## 2022-05-08 ENCOUNTER — Ambulatory Visit (INDEPENDENT_AMBULATORY_CARE_PROVIDER_SITE_OTHER): Payer: 59

## 2022-05-08 DIAGNOSIS — M79671 Pain in right foot: Secondary | ICD-10-CM

## 2022-05-08 MED ORDER — TRAMADOL HCL 50 MG PO TABS: 50.0000 mg | ORAL_TABLET | Freq: Three times a day (TID) | ORAL | 0 refills | Status: DC | PRN

## 2022-05-08 NOTE — Progress Notes (Signed)
    Procedures performed today:    None.  Independent interpretation of notes and tests performed by another provider:   None.  Brief History, Exam, Impression, and Recommendations:    Right foot pain Pleasant 56 year old female, she has had a couple weeks of increasing pain right foot localized medially over the navicular. No changes in footwear, distance/time on feet, exercise. On exam she has prominence of the navicular, pes planus, tenderness at the navicular itself as well as over the mid foot. 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    ____________________________________________ Gwen Her. Dianah Field, M.D., ABFM., CAQSM., AME. Primary Care and Sports Medicine [facility]  Adjunct Professor of [employer] of Medicine  Risk manager

## 2022-05-08 NOTE — Assessment & Plan Note (Signed)
Pleasant 56 year old female, she has had a couple weeks of increasing pain right foot localized medially over the navicular. No changes in footwear, distance/time on feet, exercise. On exam she has prominence of the navicular, pes planus, tenderness at the navicular itself as well as over the mid foot. 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

## 2022-05-22 ENCOUNTER — Encounter: Payer: Self-pay | Admitting: Internal Medicine

## 2022-05-22 ENCOUNTER — Ambulatory Visit (INDEPENDENT_AMBULATORY_CARE_PROVIDER_SITE_OTHER): Payer: 59 | Admitting: Internal Medicine

## 2022-05-22 ENCOUNTER — Other Ambulatory Visit (HOSPITAL_COMMUNITY): Payer: Self-pay

## 2022-05-22 VITALS — BP 130/82 | HR 87 | Ht 62.0 in | Wt 255.0 lb

## 2022-05-22 DIAGNOSIS — E1169 Type 2 diabetes mellitus with other specified complication: Secondary | ICD-10-CM

## 2022-05-22 DIAGNOSIS — E785 Hyperlipidemia, unspecified: Secondary | ICD-10-CM

## 2022-05-22 DIAGNOSIS — E1122 Type 2 diabetes mellitus with diabetic chronic kidney disease: Secondary | ICD-10-CM | POA: Diagnosis not present

## 2022-05-22 DIAGNOSIS — Z794 Long term (current) use of insulin: Secondary | ICD-10-CM | POA: Diagnosis not present

## 2022-05-22 LAB — POCT GLYCOSYLATED HEMOGLOBIN (HGB A1C): Hemoglobin A1C: 7.6 % — AB (ref 4.0–5.6)

## 2022-05-22 MED ORDER — TOUJEO MAX SOLOSTAR 300 UNIT/ML ~~LOC~~ SOPN: PEN_INJECTOR | SUBCUTANEOUS | 3 refills | Status: DC

## 2022-05-22 MED ORDER — OZEMPIC (2 MG/DOSE) 8 MG/3ML ~~LOC~~ SOPN: 2.0000 mg | PEN_INJECTOR | SUBCUTANEOUS | 3 refills | Status: DC

## 2022-05-22 MED ORDER — FREESTYLE LIBRE 3 SENSOR MISC: 1.0000 | 3 refills | Status: DC

## 2022-05-22 NOTE — Progress Notes (Signed)
Patient ID: Jasmine [name], female   DOB: [birth year], 56 y.o.   MRN: [id number]   HPI: [name] is a 56 y.o.-year-old female, presenting for f/u for DM2, dx in 11/27/2012, but had GDM in November 28, 1990, insulin-dependent, uncontrolled, with complications (CKD, DR).  She has been seen previously by Dr. Loanne Drilling. Last visit 8 months ago.  Interim history: She has increased urination but no blurry vision, nausea, chest pain  Reviewed HbA1c levels: Lab Results  Component Value Date   HGBA1C 7.6 (A) 09/18/2021   HGBA1C 10.8 (A) 04/09/2021   HGBA1C 7.3 (A) 12/19/2020   She is on: - Toujeo 60 >> 54 units daily - Ozempic 1  mg weekly - started 03/2021 Metformin >> tongue swelling. Wilder Glade >> repeated yeast inf's (04/2017) and increased urination She was previously on NovoLog but stopped in 07/2021 due to improved sugars. She was previously on [place] and Lantus. She was previously on Trulicity 1.5 mg weekly but had GERD, undigested food; and Wegovy but this was not covered anymore.  She checks her sugars more than 4 times a day with her  freestyle libre 2 CGM:  Previously:   Lowest sugar was 60 >> 125 >> 50s >> 59;  she has hypoglycemia awareness in the 80s. Highest sugar was  >600 >> 200s.  Glucometer: FreeStyle  Pt's meals are: - Breakfast: coffee or PB sandwich + coffee - Lunch: chicken, veggies - Dinner: bowl of cereal or skips >> now 10 oz of meat and 2 cups of veggies - Snacks: many: M and M, granola bar, trailmix She was previously going to the weight management clinic, but not recently.  -+ CKD, last BUN/creatinine:  Lab Results  Component Value Date   BUN 15 09/18/2021   BUN 11 09/19/2020   CREATININE 1.20 09/18/2021   CREATININE 1.25 (H) 09/19/2020  On losartan. -+ HL; last set of lipids: Lab Results  Component Value Date   CHOL 182 09/18/2021   HDL 44.90 09/18/2021   LDLCALC 110 (H) 09/18/2021   LDLDIRECT 103.0 09/09/2017   TRIG 136.0 09/18/2021   CHOLHDL 4  09/18/2021  Previously on Zocor 40, changed to Crestor 10 in 09/2020.  - last eye exam was in 06/2021: + DR reportedly  - no numbness and tingling in her feet.   Pt has FH of DM in "everybody" - both sides of family: M, F, S's, B's, aunts.  Her husband died of cancer in November 28, 2018.  She has a history of depression and goes to behavioral health.  She has a history of being noncompliant with diabetic medications.  ROS: + see HPI  I reviewed pt's medications, allergies, PMH, social hx, family hx, and changes were documented in the history of present illness. Otherwise, unchanged from my initial visit note.  Past Medical History:  Diagnosis Date   Anxiety    Arthritis    "knees" (03/28/2014)   Chronic renal insufficiency    stage II (mild)   Depression    Headache    with high blood sugar   Hypercholesteremia    Hypertension    Numbness and tingling of right arm and leg    Seasonal allergies    Type II diabetes mellitus ([place])    Umbilical hernia without mention of obstruction or gangrene    Wears glasses    Past Surgical History:  Procedure Laterality Date   INSERTION OF MESH N/A 03/28/2014   Procedure: INSERTION OF MESH;  Surgeon: Ralene Ok, MD;  Location: [place];  Service: General;  Laterality: N/A;   INSERTION OF MESH N/A 05/08/2015   Procedure: INSERTION OF MESH;  Surgeon: Ralene Ok, MD;  Location: [place];  Service: General;  Laterality: N/A;   LAPAROSCOPIC LYSIS OF ADHESIONS N/A 05/08/2015   Procedure: LAPAROSCOPIC LYSIS OF ADHESIONS;  Surgeon: Ralene Ok, MD;  Location: [place];  Service: General;  Laterality: N/A;   MULTIPLE TOOTH EXTRACTIONS  ~ 2003   [place] HERNIA REPAIR N/A 03/28/2014   Procedure: LAPAROSCOPIC VENTRAL HERNIA;  Surgeon: Ralene Ok, MD;  Location: [place];  Service: General;  Laterality: N/A;   VENTRAL HERNIA REPAIR N/A 05/08/2015   Procedure: LAPAROSCOPIC VENTRAL HERNIA REPAIR WITH MESH;   Surgeon: Ralene Ok, MD;  Location: [place];  Service: General;  Laterality: N/A;   Social History   Socioeconomic History   Marital status: Widowed    Spouse name: [name]   Number of children: 1   Years of education: Not on file   Highest education level: Not on file  Occupational History   Not on file  Tobacco Use   Smoking status: Former    Packs/day: 0.25    Years: 26.00    Total pack years: 6.50    Types: Cigarettes   Smokeless tobacco: Never  Vaping Use   Vaping Use: Never used  Substance and Sexual Activity   Alcohol use: Yes    Comment: "have a drink a few times/year"   Drug use: No   Sexual activity: Not Currently  Other Topics Concern   Not on file  Social History Narrative   Not on file   Social Determinants of Health   Financial Resource Strain: Not on file  Food Insecurity: Not on file  Transportation Needs: Not on file  Physical Activity: Not on file  Stress: Not on file  Social Connections: Not on file  Intimate Partner Violence: Not on file   Current Outpatient Medications on File Prior to Visit  Medication Sig Dispense Refill   albuterol (VENTOLIN HFA) 108 (90 Base) MCG/ACT inhaler Inhale 2 puffs into the lungs every 6 (six) hours as needed. 18 g 0   atorvastatin (LIPITOR) 40 MG tablet Take 1 tablet (40 mg total) by mouth daily. 90 tablet 3   azithromycin (ZITHROMAX Z-PAK) 250 MG tablet Take 2 tablets (500 mg) on  Day 1,  followed by 1 tablet (250 mg) once daily on Days 2 through 5. 6 tablet 0   Continuous Blood Gluc Sensor (FREESTYLE LIBRE 2 SENSOR) MISC Use as instructed to be changed every 14 days 6 each 3   insulin glargine, 2 Unit Dial, (TOUJEO MAX SOLOSTAR) 300 UNIT/ML Solostar Pen INJECT 60 UNITS UNDER THE SKIN AS DIRECTED 12 mL 0   Insulin Pen Needle 32G X 4 MM MISC Use 4x a day 300 each 3   meloxicam (MOBIC) 7.5 MG tablet Take 1 tablet (7.5 mg total) by mouth daily. 90 tablet 3   olmesartan-hydrochlorothiazide (BENICAR HCT)  20-12.5 MG tablet Take 1 tablet by mouth daily. 90 tablet 1   Semaglutide, 1 MG/DOSE, (OZEMPIC, 1 MG/DOSE,) 4 MG/3ML SOPN Inject 1 mg into the skin once a week. 9 mL 1   sertraline (ZOLOFT) 100 MG tablet TAKE ONE AND ONE/HALF TABLET DAILY. 135 tablet 1   traMADol (ULTRAM) 50 MG tablet Take 1 tablet (50 mg total) by mouth every 8 (eight) hours as needed for moderate pain. 21 tablet 0   Vitamin  D, Ergocalciferol, (DRISDOL) 1.25 MG (50000 UNIT) CAPS capsule Take 1 capsule (50,000 Units total) by mouth every 7 (seven) days. 12 capsule 1   No current facility-administered medications on file prior to visit.   Allergies  Allergen Reactions   Eggs Or Egg-Derived Products Hives and Other (See Comments)    FLU VACCINE: caused hives     Influenza Vaccines Hives    Vaccine from Egg derived product causes Hives   Crestor [Rosuvastatin]     Made her arm feel like it was numb.   Effexor [Venlafaxine]     "felt weird"   Lantus [Insulin Glargine] Other (See Comments)    NOT EFFECTIVE >> sugars increase to >600!   Metformin And Related Swelling    ANGIOEDEMA   Family History  Problem Relation Age of Onset   Diabetes Mother    High Cholesterol Mother    Obesity Mother    Diabetes Father    Cancer Father        Prostate cancer   Cancer Sister        Cervical cancer   Diabetes Brother    Bipolar disorder Brother    Alcohol abuse Brother    Drug abuse Brother    Diabetes Sister    Diabetes Brother     PE: BP 130/82 (BP Location: Right Arm, Patient Position: Sitting, Cuff Size: Normal)   Pulse 87   Ht 5\' 2"  (1.575 m)   Wt 255 lb (115.7 kg)   SpO2 95%   BMI 46.64 kg/m  Wt Readings from Last 3 Encounters:  05/22/22 255 lb (115.7 kg)  10/07/21 250 lb (113.4 kg)  09/18/21 254 lb 12.8 oz (115.6 kg)   Constitutional: overweight, in NAD Eyes:  EOMI, no exophthalmos ENT: no neck masses, no cervical lymphadenopathy Cardiovascular: RRR, No MRG Respiratory: CTA B Musculoskeletal: no  deformities Skin:no rashes Neurological: no tremor with outstretched hands  ASSESSMENT: 1. DM2, insulin-dependent, uncontrolled, with complications - CKD stage 2 - DR  2. HL  3. Obesity class III  PLAN:  1. Patient with uncontrolled, type II, diabetes, on a basal/bolus insulin regimen in the past but currently only on long-acting insulin and weekly GLP-1 receptor agonist.  Sugars improved after starting Ozempic.  She was previously on Trulicity, then [place], but this was not covered by insurance anymore so we switched to [place].  This was approved with the PA.  She was on SGLT2 inhibitors in the past but she came off due to increased urination Wilder Glade).  Retrospectively, this could have been caused by glycosuria in the setting of uncontrolled diabetes.  At last visit, HbA1c was much better, at 7.6%.  We decreased her Toujeo dose since she had low blood sugars in the 50s and 60s.  We did not add back NovoLog at that time. CGM interpretation: -At today's visit, we reviewed her CGM downloads: It appears that 88% of values are in target range (goal >70%), while 12% are higher than 180 (goal <25%), and 0% are lower than 70 (goal <4%).  The calculated average blood sugar is 137.  The projected HbA1c for the next 3 months (GMI) is under 7%. -Reviewing the CGM trends, sugars mostly fluctuating in the target range with the exception of after lunch, may be slightly higher.  We still have data loss around dinnertime and I advised her to scan the device more frequently.However, we will also try to get her on the freestyle libre 3 CGM, which does not require scanning. -I recommended  to continue decrease the Toujeo dose and increase the Ozempic to better cover her meals, especially holidays coming up with - I suggested to:  Patient Instructions  Please decrease: - Toujeo 46 units daily  Increase: - Ozempic 2 mg weekly  Please return in 3-4 month.  - we checked her HbA1c: 7.6% (higher) - advised to  check sugars at different times of the day - 4x a day, rotating check times - advised for yearly eye exams >> she is UTD - return to clinic in 3-4 months  2. HL -Viewed latest lipid panel from 09/2021: LDL above target, the rest the fractions at goal: Lab Results  Component Value Date   CHOL 182 09/18/2021   HDL 44.90 09/18/2021   LDLCALC 110 (H) 09/18/2021   LDLDIRECT 103.0 09/09/2017   TRIG 136.0 09/18/2021   CHOLHDL 4 09/18/2021  -she is on Lipitor 40 mg daily.   3. Obesity class III -We tried SGLT2 inhibitors but she could not tolerate these. -Mancel Parsons was not approved by her insurance in the past -We will continue Ozempic which should also help with weight loss  Office Visit on 05/22/2022  Component Date Value Ref Range Status   Hemoglobin A1C 05/22/2022 7.6 (A)  4.0 - 5.6 % Final   Fructosamine 05/22/2022 284  205 - 285 umol/L Final   HbA1c calculated from fructosamine is 6.4%, better than the directly measured HbA1c.  Philemon Kingdom, MD PhD [facility]

## 2022-05-22 NOTE — Patient Instructions (Addendum)
Please decrease: - Toujeo 46 units daily  Increase: - Ozempic 2 mg weekly  Please return in 3-4 month.

## 2022-05-25 ENCOUNTER — Telehealth: Payer: Self-pay

## 2022-05-25 ENCOUNTER — Other Ambulatory Visit (HOSPITAL_COMMUNITY): Payer: Self-pay

## 2022-05-25 NOTE — Telephone Encounter (Signed)
Patient Advocate Encounter  Received notice of prior authorization needed for Ozempic (2 MG/DOSE) 8MG /3ML pen-injectors   Per Test Claim: 1 month supply is covered with no prior authorization needed.   Called pharmacy to confirm, prescription did go through for 1 month supply, however, medication is on backorder to maybe mid-December.

## 2022-05-25 NOTE — Telephone Encounter (Signed)
Called and lvm for pt to contact office for her Ozempic refills.

## 2022-05-27 ENCOUNTER — Encounter: Payer: Self-pay | Admitting: Internal Medicine

## 2022-05-28 NOTE — Telephone Encounter (Signed)
Patient has now picked up Ozempic sample.

## 2022-05-29 ENCOUNTER — Encounter: Payer: Self-pay | Admitting: Internal Medicine

## 2022-05-29 LAB — FRUCTOSAMINE: Fructosamine: 284 umol/L (ref 205–285)

## 2022-06-01 ENCOUNTER — Encounter (INDEPENDENT_AMBULATORY_CARE_PROVIDER_SITE_OTHER): Payer: 59 | Admitting: Sports Medicine

## 2022-06-01 DIAGNOSIS — M79671 Pain in right foot: Secondary | ICD-10-CM | POA: Diagnosis not present

## 2022-06-01 NOTE — Telephone Encounter (Addendum)
I spent 11 total minutes of online digital evaluation and management services in this patient-initiated request for online care. 

## 2022-06-05 ENCOUNTER — Ambulatory Visit: Payer: 59 | Admitting: Sports Medicine

## 2022-06-15 ENCOUNTER — Encounter: Payer: Self-pay | Admitting: Internal Medicine

## 2022-07-06 ENCOUNTER — Encounter: Payer: Self-pay | Admitting: Internal Medicine

## 2022-07-08 NOTE — Telephone Encounter (Signed)
Patient picked up sample of Ozempic in office today. 

## 2022-08-22 LAB — HM MAMMOGRAPHY

## 2022-08-26 ENCOUNTER — Other Ambulatory Visit: Payer: Self-pay | Admitting: Sports Medicine

## 2022-08-26 DIAGNOSIS — M79671 Pain in right foot: Secondary | ICD-10-CM

## 2022-08-26 MED ORDER — TRAMADOL HCL 50 MG PO TABS: 50.0000 mg | ORAL_TABLET | Freq: Three times a day (TID) | ORAL | 0 refills | Status: DC | PRN

## 2022-08-31 ENCOUNTER — Encounter: Payer: Self-pay | Admitting: Physician Assistant

## 2022-09-29 ENCOUNTER — Encounter: Payer: Self-pay | Admitting: Internal Medicine

## 2022-09-29 ENCOUNTER — Ambulatory Visit (INDEPENDENT_AMBULATORY_CARE_PROVIDER_SITE_OTHER): Payer: 59 | Admitting: Internal Medicine

## 2022-09-29 VITALS — BP 122/70 | HR 78 | Ht 62.0 in | Wt 250.6 lb

## 2022-09-29 DIAGNOSIS — Z794 Long term (current) use of insulin: Secondary | ICD-10-CM | POA: Diagnosis not present

## 2022-09-29 DIAGNOSIS — E1122 Type 2 diabetes mellitus with diabetic chronic kidney disease: Secondary | ICD-10-CM | POA: Diagnosis not present

## 2022-09-29 DIAGNOSIS — E785 Hyperlipidemia, unspecified: Secondary | ICD-10-CM | POA: Diagnosis not present

## 2022-09-29 DIAGNOSIS — E1169 Type 2 diabetes mellitus with other specified complication: Secondary | ICD-10-CM

## 2022-09-29 DIAGNOSIS — N182 Chronic kidney disease, stage 2 (mild): Secondary | ICD-10-CM | POA: Diagnosis not present

## 2022-09-29 LAB — COMPREHENSIVE METABOLIC PANEL
ALT: 11 U/L (ref 0–35)
AST: 13 U/L (ref 0–37)
Albumin: 4.3 g/dL (ref 3.5–5.2)
Alkaline Phosphatase: 64 U/L (ref 39–117)
BUN: 14 mg/dL (ref 6–23)
CO2: 23 mEq/L (ref 19–32)
Calcium: 9.8 mg/dL (ref 8.4–10.5)
Chloride: 105 mEq/L (ref 96–112)
Creatinine, Ser: 1.21 mg/dL — ABNORMAL HIGH (ref 0.40–1.20)
GFR: 50.61 mL/min — ABNORMAL LOW (ref >60.00)
Glucose, Bld: 94 mg/dL (ref 70–99)
Potassium: 4.2 mEq/L (ref 3.5–5.1)
Sodium: 137 mEq/L (ref 135–145)
Total Bilirubin: 0.3 mg/dL (ref 0.2–1.2)
Total Protein: 7.6 g/dL (ref 6.0–8.3)

## 2022-09-29 LAB — LIPID PANEL
Cholesterol: 218 mg/dL — ABNORMAL HIGH (ref 0–200)
HDL: 46.7 mg/dL (ref >39.00)
LDL Cholesterol: 137 mg/dL — ABNORMAL HIGH (ref 0–99)
NonHDL: 171.28
Total CHOL/HDL Ratio: 5
Triglycerides: 170 mg/dL — ABNORMAL HIGH (ref 0.0–149.0)
VLDL: 34 mg/dL (ref 0.0–40.0)

## 2022-09-29 LAB — MICROALBUMIN / CREATININE URINE RATIO
Creatinine,U: 87.7 mg/dL
Microalb Creat Ratio: 0.8 mg/g (ref 0.0–30.0)
Microalb, Ur: <0.7 mg/dL (ref 0.0–1.9)

## 2022-09-29 LAB — POCT GLYCOSYLATED HEMOGLOBIN (HGB A1C): Hemoglobin A1C: 7.3 % — AB (ref 4.0–5.6)

## 2022-09-29 MED ORDER — EMPAGLIFLOZIN 10 MG PO TABS: 10.0000 mg | ORAL_TABLET | Freq: Every day | ORAL | 3 refills | Status: DC

## 2022-09-29 NOTE — Progress Notes (Unsigned)
Patient ID: Jasmine [name], female   DOB: March 01, 1966, 57 y.o.   MRN: [id number]   HPI: Jasmine [name] is a 57 y.o.-year-old female, presenting for f/u for DM2, dx in November 12, 2012, but had GDM in 11-13-1990, insulin-dependent, uncontrolled, with complications (CKD, DR).  She has been seen previously by Dr. Loanne Drilling. Last visit with me 4 months ago.  Interim history: No increased urination, blurry vision, nausea, chest pain.  Reviewed HbA1c levels: 05/22/2022: HbA1c calculated from fructosamine is 6.4%, better than the directly measured HbA1c. Lab Results  Component Value Date   HGBA1C 7.6 (A) 05/22/2022   HGBA1C 7.6 (A) 09/18/2021   HGBA1C 10.8 (A) 04/09/2021   She is on: - Toujeo 60 >> 54 >> 46 >> she increased to 64 units daily - Ozempic 1  mg weekly - started 03/2021 >> 2 mg weekly Metformin >> tongue swelling. Wilder Glade >> repeated yeast inf's (04/2017) and increased urination She was previously on NovoLog but stopped in 07/2021 due to improved sugars. She was previously on [place] and Lantus. She was previously on Trulicity 1.5 mg weekly but had GERD, undigested food; and Wegovy but this was not covered anymore.  She checks her sugars more than 4 times a day with her  freestyle libre 3 CGM:  Previously:  Previously:   Lowest sugar was 60 >> 125 >> 50s >> 59 >> 60;  she has hypoglycemia awareness in the 80s. Highest sugar was  >600 >> 200s >> upper 200s.  Glucometer: FreeStyle  Pt's meals are: - Breakfast: coffee or PB sandwich + coffee - Lunch: chicken, veggies - Dinner: bowl of cereal or skips >> now 10 oz of meat and 2 cups of veggies - Snacks: many: M and M, granola bar, trailmix She was previously going to the weight management clinic, but not recently.  -+ CKD, last BUN/creatinine:  Lab Results  Component Value Date   BUN 15 09/18/2021   BUN 11 09/19/2020   CREATININE 1.20 09/18/2021   CREATININE 1.25 (H) 09/19/2020  Prev. On Olmesartan.  -+ HL; last set of lipids: Lab  Results  Component Value Date   CHOL 182 09/18/2021   HDL 44.90 09/18/2021   LDLCALC 110 (H) 09/18/2021   LDLDIRECT 103.0 09/09/2017   TRIG 136.0 09/18/2021   CHOLHDL 4 09/18/2021  On Crestor 10 mg, changed from Zocor in 09/2020.  - last eye exam was in 11/12/2021: + DR reportedly  - no numbness and tingling in her feet.  Last foot exam 05/22/2022.  Pt has FH of DM in "everybody" - both sides of family: M, F, S's, B's, aunts.  Her husband died of cancer in 11-13-2018.  She has a history of depression and goes to behavioral health.  She has a history of being noncompliant with diabetic medications.  ROS: + see HPI  I reviewed pt's medications, allergies, PMH, social hx, family hx, and changes were documented in the history of present illness. Otherwise, unchanged from my initial visit note.  Past Medical History:  Diagnosis Date   Anxiety    Arthritis    "knees" (03/28/2014)   Chronic renal insufficiency    stage II (mild)   Depression    Headache    with high blood sugar   Hypercholesteremia    Hypertension    Numbness and tingling of right arm and leg    Seasonal allergies    Type II diabetes mellitus ([facility])    Umbilical hernia without mention of obstruction or gangrene  Wears glasses    Past Surgical History:  Procedure Laterality Date   INSERTION OF MESH N/A 03/28/2014   Procedure: INSERTION OF MESH;  Surgeon: Ralene Ok, MD;  Location: [place];  Service: General;  Laterality: N/A;   INSERTION OF MESH N/A 05/08/2015   Procedure: INSERTION OF MESH;  Surgeon: Ralene Ok, MD;  Location: [place];  Service: General;  Laterality: N/A;   LAPAROSCOPIC LYSIS OF ADHESIONS N/A 05/08/2015   Procedure: LAPAROSCOPIC LYSIS OF ADHESIONS;  Surgeon: Ralene Ok, MD;  Location: [place];  Service: General;  Laterality: N/A;   MULTIPLE TOOTH EXTRACTIONS  ~ 2003   [place] HERNIA REPAIR N/A 03/28/2014   Procedure: LAPAROSCOPIC VENTRAL HERNIA;   Surgeon: Ralene Ok, MD;  Location: [place];  Service: General;  Laterality: N/A;   VENTRAL HERNIA REPAIR N/A 05/08/2015   Procedure: LAPAROSCOPIC VENTRAL HERNIA REPAIR WITH MESH;  Surgeon: Ralene Ok, MD;  Location: [place];  Service: General;  Laterality: N/A;   Social History   Socioeconomic History   Marital status: Widowed    Spouse name: [name]   Number of children: 1   Years of education: Not on file   Highest education level: Not on file  Occupational History   Not on file  Tobacco Use   Smoking status: Former    Packs/day: 0.25    Years: 26.00    Total pack years: 6.50    Types: Cigarettes   Smokeless tobacco: Never  Vaping Use   Vaping Use: Never used  Substance and Sexual Activity   Alcohol use: Yes    Comment: "have a drink a few times/year"   Drug use: No   Sexual activity: Not Currently  Other Topics Concern   Not on file  Social History Narrative   Not on file   Social Determinants of Health   Financial Resource Strain: Not on file  Food Insecurity: Not on file  Transportation Needs: Not on file  Physical Activity: Not on file  Stress: Not on file  Social Connections: Not on file  Intimate Partner Violence: Not on file   Current Outpatient Medications on File Prior to Visit  Medication Sig Dispense Refill   albuterol (VENTOLIN HFA) 108 (90 Base) MCG/ACT inhaler Inhale 2 puffs into the lungs every 6 (six) hours as needed. 18 g 0   atorvastatin (LIPITOR) 40 MG tablet Take 1 tablet (40 mg total) by mouth daily. 90 tablet 3   azithromycin (ZITHROMAX Z-PAK) 250 MG tablet Take 2 tablets (500 mg) on  Day 1,  followed by 1 tablet (250 mg) once daily on Days 2 through 5. 6 tablet 0   Continuous Blood Gluc Sensor (FREESTYLE LIBRE 2 SENSOR) MISC Use as instructed to be changed every 14 days 6 each 3   Continuous Blood Gluc Sensor (FREESTYLE LIBRE 3 SENSOR) MISC 1 each by Does not apply route every 14 (fourteen) days. 6 each 3   insulin glargine, 2  Unit Dial, (TOUJEO MAX SOLOSTAR) 300 UNIT/ML Solostar Pen INJECT 46 UNITS UNDER THE SKIN AS DIRECTED 12 mL 3   Insulin Pen Needle 32G X 4 MM MISC Use 4x a day 300 each 3   meloxicam (MOBIC) 7.5 MG tablet Take 1 tablet (7.5 mg total) by mouth daily. 90 tablet 3   olmesartan-hydrochlorothiazide (BENICAR HCT) 20-12.5 MG tablet Take 1 tablet by mouth daily. 90 tablet 1   Semaglutide, 2 MG/DOSE, (OZEMPIC, 2 MG/DOSE,) 8  MG/3ML SOPN Inject 2 mg into the skin once a week. 9 mL 3   sertraline (ZOLOFT) 100 MG tablet TAKE ONE AND ONE/HALF TABLET DAILY. 135 tablet 1   traMADol (ULTRAM) 50 MG tablet Take 1 tablet (50 mg total) by mouth every 8 (eight) hours as needed for moderate pain. 60 tablet 0   Vitamin D, Ergocalciferol, (DRISDOL) 1.25 MG (50000 UNIT) CAPS capsule Take 1 capsule (50,000 Units total) by mouth every 7 (seven) days. 12 capsule 1   No current facility-administered medications on file prior to visit.   Allergies  Allergen Reactions   Eggs Or Egg-Derived Products Hives and Other (See Comments)    FLU VACCINE: caused hives     Influenza Vaccines Hives    Vaccine from Egg derived product causes Hives   Crestor [Rosuvastatin]     Made her arm feel like it was numb.   Effexor [Venlafaxine]     "felt weird"   Lantus [Insulin Glargine] Other (See Comments)    NOT EFFECTIVE >> sugars increase to >600!   Metformin And Related Swelling    ANGIOEDEMA   Family History  Problem Relation Age of Onset   Diabetes Mother    High Cholesterol Mother    Obesity Mother    Diabetes Father    Cancer Father        Prostate cancer   Cancer Sister        Cervical cancer   Diabetes Brother    Bipolar disorder Brother    Alcohol abuse Brother    Drug abuse Brother    Diabetes Sister    Diabetes Brother     PE: BP 122/70 (BP Location: Right Arm, Patient Position: Sitting, Cuff Size: Normal)   Pulse 78   Ht 5' 2"$  (1.575 m)   Wt 250 lb 9.6 oz (113.7 kg)   SpO2 98%   BMI 45.84 kg/m  Wt  Readings from Last 3 Encounters:  09/29/22 250 lb 9.6 oz (113.7 kg)  05/22/22 255 lb (115.7 kg)  10/07/21 250 lb (113.4 kg)   Constitutional: overweight, in NAD Eyes:  EOMI, no exophthalmos ENT: no neck masses, no cervical lymphadenopathy Cardiovascular: RRR, No MRG Respiratory: CTA B Musculoskeletal: no deformities Skin:no rashes Neurological: no tremor with outstretched hands  ASSESSMENT: 1. DM2, insulin-dependent, uncontrolled, with complications - CKD stage 2 - DR  2. HL  3. Obesity class III  PLAN:  1. Patient with uncontrolled type 2 diabetes, on basal insulin and weekly GLP-1 receptor agonist, with improvement in blood sugars at last visit.  HbA1c at that time was 6.4% as calculated from the fructosamine level, but 7.6% is directly measured.  Sugars were mostly fluctuating within the target range with occasionally higher blood sugars after lunch.  We still had some data loss around dinnertime and I advised her to scan the device more frequently.  I did recommend a freestyle libre 3 CGM, which did not require scanning.  We decreased her Toujeo dose and increase the Ozempic dose at that time.  We did not have to add back NovoLog. CGM interpretation: -At today's visit, we reviewed her CGM downloads: It appears that 81% of values are in target range (goal >70%), while 19% are higher than 180 (goal <25%), and 0% are lower than 70 (goal <4%).  The calculated average blood sugar is 146.  The projected HbA1c for the next 3 months (GMI) is 6.8%. -Reviewing the CGM trends, sugars are mostly fluctuating within the target range but they do  increase more after lunch and dinner.  We discussed about trying to improve the quality of these meals but I also advised her to try to add an SGLT2 inhibitor.  We discussed about benefits and possible side effects.  Advised her to stay well-hydrated.  I inquired about her any coronary, but she mentions that she is not taking this.  We may need to start the  olmesartan part of the Benicar in the future. -We also discussed about trying to reduce her insulin dose.  Since last visit, she increase the dose by almost 20 units, inadvertently.  I advised her to try to reduce it slightly. - I suggested to:  Patient Instructions  Please continue: - Toujeo 60-64 units daily - Ozempic 2 mg weekly  Try to start: - Jardiance 10 mg daily  Please return in 3-4 month.  - we checked her HbA1c: 7.3% (slightly lower) - advised to check sugars at different times of the day - 4x a day, rotating check times - advised for yearly eye exams >> she is UTD - return to clinic in 3-4 months  2. HL -Reviewed the latest lipid panel from 09/2021: LDL elevated, the rest the fractions at goal: Lab Results  Component Value Date   CHOL 182 09/18/2021   HDL 44.90 09/18/2021   LDLCALC 110 (H) 09/18/2021   LDLDIRECT 103.0 09/09/2017   TRIG 136.0 09/18/2021   CHOLHDL 4 09/18/2021  -She continues on Lipitor 40 mg daily without side effects -She is due for another lipid panel >> will check today  3. Obesity class III -We tried [place] but she developed yeast infections.  However, she is agreeable to retry Jardiance. Mancel Parsons was not approved by her insurance in the past but she is now on Ozempic, which should also help with weight loss. -She lost 5 pounds since last visit  Philemon Kingdom, MD PhD [facility]

## 2022-09-29 NOTE — Patient Instructions (Addendum)
Please continue: - Toujeo 60-64 units daily - Ozempic 2 mg weekly  Try to start: - Jardiance 10 mg daily  Please return in 3-4 month.

## 2022-09-30 ENCOUNTER — Encounter: Payer: Self-pay | Admitting: Internal Medicine

## 2022-09-30 ENCOUNTER — Other Ambulatory Visit: Payer: Self-pay | Admitting: Internal Medicine

## 2022-09-30 MED ORDER — DAPAGLIFLOZIN PROPANEDIOL 5 MG PO TABS: 5.0000 mg | ORAL_TABLET | Freq: Every day | ORAL | 3 refills | Status: DC

## 2023-02-04 ENCOUNTER — Encounter: Payer: Self-pay | Admitting: Internal Medicine

## 2023-02-04 ENCOUNTER — Ambulatory Visit (INDEPENDENT_AMBULATORY_CARE_PROVIDER_SITE_OTHER): Payer: 59 | Admitting: Internal Medicine

## 2023-02-04 ENCOUNTER — Other Ambulatory Visit: Payer: Self-pay | Admitting: Internal Medicine

## 2023-02-04 VITALS — BP 120/64 | HR 84 | Ht 62.0 in | Wt 245.0 lb

## 2023-02-04 DIAGNOSIS — E1122 Type 2 diabetes mellitus with diabetic chronic kidney disease: Secondary | ICD-10-CM

## 2023-02-04 DIAGNOSIS — E1169 Type 2 diabetes mellitus with other specified complication: Secondary | ICD-10-CM

## 2023-02-04 DIAGNOSIS — Z794 Long term (current) use of insulin: Secondary | ICD-10-CM | POA: Diagnosis not present

## 2023-02-04 DIAGNOSIS — E119 Type 2 diabetes mellitus without complications: Secondary | ICD-10-CM

## 2023-02-04 DIAGNOSIS — Z7984 Long term (current) use of oral hypoglycemic drugs: Secondary | ICD-10-CM

## 2023-02-04 DIAGNOSIS — E785 Hyperlipidemia, unspecified: Secondary | ICD-10-CM

## 2023-02-04 DIAGNOSIS — Z6841 Body Mass Index (BMI) 40.0 and over, adult: Secondary | ICD-10-CM

## 2023-02-04 DIAGNOSIS — N182 Chronic kidney disease, stage 2 (mild): Secondary | ICD-10-CM | POA: Diagnosis not present

## 2023-02-04 DIAGNOSIS — Z7985 Long-term (current) use of injectable non-insulin antidiabetic drugs: Secondary | ICD-10-CM

## 2023-02-04 LAB — HEMOGLOBIN A1C: Hemoglobin A1C: 6.9

## 2023-02-04 MED ORDER — TOUJEO MAX SOLOSTAR 300 UNIT/ML ~~LOC~~ SOPN: PEN_INJECTOR | SUBCUTANEOUS | 3 refills | Status: DC

## 2023-02-04 MED ORDER — OZEMPIC (2 MG/DOSE) 8 MG/3ML ~~LOC~~ SOPN: 2.0000 mg | PEN_INJECTOR | SUBCUTANEOUS | 3 refills | Status: DC

## 2023-02-04 NOTE — Patient Instructions (Addendum)
Please decrease: - Toujeo 40-42 units daily  Continue: - Ozempic 2 mg weekly - Farxiga 5 mg daily  Please return in 4-5 months.

## 2023-02-04 NOTE — Progress Notes (Signed)
Patient ID: Jasmine [name], female   DOB: 1966-01-13, 57 y.o.   MRN: [id number]   HPI: Jasmine [name] is a 57 y.o.-year-old female, presenting for f/u for DM2, dx in 03-09-13, but had GDM in March 10, 1991, insulin-dependent, uncontrolled, with complications (CKD, DR).  She has been seen previously by Dr. Everardo All. Last visit with me 4 months ago.  Interim history: + increased urination, no blurry vision, nausea, chest pain. Since last seen, she was not able to use Jardiance due to price and she is now back on Farxiga.  Initially, she had more problems with yeast infections but now resolved.  Reviewed HbA1c levels: Lab Results  Component Value Date   HGBA1C 7.3 (A) 09/29/2022   HGBA1C 7.6 (A) 05/22/2022   HGBA1C 7.6 (A) 09/18/2021  05/22/2022: HbA1c calculated from fructosamine is 6.4%, better than the directly measured HbA1c.  She is on: - Toujeo 60 >> 54 >> 46 >> 64 >> 60-64 >> 46 units daily - Ozempic 1  mg weekly - started 03/2021 >> 2 mg weekly - Jardiance 10 mg before b'fast >> Farxiga 5 mg daily in am (intermittently)  - now tolerated well Metformin >> tongue swelling. Jasmine [name] >> repeated yeast inf's (04/2017) and increased urination She was previously on NovoLog but stopped in 07/2021 due to improved sugars. She was previously on [place] and Lantus. She was previously on Trulicity 1.5 mg weekly but had GERD, undigested food; and Wegovy but this was not covered anymore.  She checks her sugars more than 4 times a day with her  freestyle libre 3 CGM:  Previously:  Previously:   Lowest sugar was 50s >> 59 >> 60 >> 50s;  she has hypoglycemia awareness in the 80s. Highest sugar was  >600 >> 200s >> upper 200s >> 200s.  Glucometer: FreeStyle  Pt's meals are: - Breakfast: coffee or PB sandwich + coffee - Lunch: chicken, veggies - Dinner: bowl of cereal or skips >> now 10 oz of meat and 2 cups of veggies - Snacks: many: M and M, granola bar, trailmix She was previously going to the  weight management clinic, but not recently.  -+ CKD, last BUN/creatinine:  Lab Results  Component Value Date   BUN 14 09/29/2022   BUN 15 09/18/2021   CREATININE 1.21 (H) 09/29/2022   CREATININE 1.20 09/18/2021  Prev. On Olmesartan.  -+ HL; last set of lipids: Lab Results  Component Value Date   CHOL 218 (H) 09/29/2022   HDL 46.70 09/29/2022   LDLCALC 137 (H) 09/29/2022   LDLDIRECT 103.0 09/09/2017   TRIG 170.0 (H) 09/29/2022   CHOLHDL 5 09/29/2022  On Crestor 10 mg, changed from Zocor in 09/2020.  - last eye exam was in 03-09-22: + DR reportedly  - no numbness and tingling in her feet.  Last foot exam 05/22/2022.  Pt has FH of DM in "everybody" - both sides of family: M, F, S's, B's, aunts.  Her husband died of cancer in 2019-03-10.  She has a history of depression and goes to behavioral health.  She has a history of being noncompliant with diabetic medications.  ROS: + see HPI  I reviewed pt's medications, allergies, PMH, social hx, family hx, and changes were documented in the history of present illness. Otherwise, unchanged from my initial visit note.  Past Medical History:  Diagnosis Date   Anxiety    Arthritis    "knees" (03/28/2014)   Chronic renal insufficiency    stage II (mild)   Depression  Headache    with high blood sugar   Hypercholesteremia    Hypertension    Numbness and tingling of right arm and leg    Seasonal allergies    Type II diabetes mellitus (HCC)    Umbilical hernia without mention of obstruction or gangrene    Wears glasses    Past Surgical History:  Procedure Laterality Date   INSERTION OF MESH N/A 03/28/2014   Procedure: INSERTION OF MESH;  Surgeon: Axel Filler, MD;  Location: [facility] OR;  Service: General;  Laterality: N/A;   INSERTION OF MESH N/A 05/08/2015   Procedure: INSERTION OF MESH;  Surgeon: Axel Filler, MD;  Location: [facility] OR;  Service: General;  Laterality: N/A;   LAPAROSCOPIC LYSIS OF ADHESIONS N/A 05/08/2015   Procedure:  LAPAROSCOPIC LYSIS OF ADHESIONS;  Surgeon: Axel Filler, MD;  Location: [facility] OR;  Service: General;  Laterality: N/A;   MULTIPLE TOOTH EXTRACTIONS  ~ 2003   TONSILLECTOMY     UMBILICAL HERNIA REPAIR  1973   VENTRAL HERNIA REPAIR N/A 03/28/2014   Procedure: LAPAROSCOPIC VENTRAL HERNIA;  Surgeon: Axel Filler, MD;  Location: [facility] OR;  Service: General;  Laterality: N/A;   VENTRAL HERNIA REPAIR N/A 05/08/2015   Procedure: LAPAROSCOPIC VENTRAL HERNIA REPAIR WITH MESH;  Surgeon: Axel Filler, MD;  Location: [facility] OR;  Service: General;  Laterality: N/A;   Social History   Socioeconomic History   Marital status: Widowed    Spouse name: Florencia Reasons   Number of children: 1   Years of education: Not on file   Highest education level: Not on file  Occupational History   Not on file  Tobacco Use   Smoking status: Former    Packs/day: 0.25    Years: 26.00    Additional pack years: 0.00    Total pack years: 6.50    Types: Cigarettes   Smokeless tobacco: Never  Vaping Use   Vaping Use: Never used  Substance and Sexual Activity   Alcohol use: Yes    Comment: "have a drink a few times/year"   Drug use: No   Sexual activity: Not Currently  Other Topics Concern   Not on file  Social History Narrative   Not on file   Social Determinants of Health   Financial Resource Strain: Not on file  Food Insecurity: Not on file  Transportation Needs: Not on file  Physical Activity: Not on file  Stress: Not on file  Social Connections: Not on file  Intimate Partner Violence: Not on file   Current Outpatient Medications on File Prior to Visit  Medication Sig Dispense Refill   albuterol (VENTOLIN HFA) 108 (90 Base) MCG/ACT inhaler Inhale 2 puffs into the lungs every 6 (six) hours as needed. 18 g 0   atorvastatin (LIPITOR) 40 MG tablet Take 1 tablet (40 mg total) by mouth daily. 90 tablet 3   azithromycin (ZITHROMAX Z-PAK) 250 MG tablet Take 2 tablets (500 mg) on  Day 1,  followed by 1 tablet  (250 mg) once daily on Days 2 through 5. 6 tablet 0   Continuous Blood Gluc Sensor (FREESTYLE LIBRE 2 SENSOR) MISC Use as instructed to be changed every 14 days 6 each 3   Continuous Blood Gluc Sensor (FREESTYLE LIBRE 3 SENSOR) MISC 1 each by Does not apply route every 14 (fourteen) days. 6 each 3   dapagliflozin propanediol (FARXIGA) 5 MG TABS tablet Take 1 tablet (5 mg total) by mouth daily before breakfast. 90 tablet 3   insulin glargine,  2 Unit Dial, (TOUJEO MAX SOLOSTAR) 300 UNIT/ML Solostar Pen INJECT 46 UNITS UNDER THE SKIN AS DIRECTED 12 mL 3   Insulin Pen Needle 32G X 4 MM MISC Use 4x a day 300 each 3   meloxicam (MOBIC) 7.5 MG tablet Take 1 tablet (7.5 mg total) by mouth daily. 90 tablet 3   olmesartan-hydrochlorothiazide (BENICAR HCT) 20-12.5 MG tablet Take 1 tablet by mouth daily. 90 tablet 1   Semaglutide, 2 MG/DOSE, (OZEMPIC, 2 MG/DOSE,) 8 MG/3ML SOPN Inject 2 mg into the skin once a week. 9 mL 3   sertraline (ZOLOFT) 100 MG tablet TAKE ONE AND ONE/HALF TABLET DAILY. 135 tablet 1   traMADol (ULTRAM) 50 MG tablet Take 1 tablet (50 mg total) by mouth every 8 (eight) hours as needed for moderate pain. 60 tablet 0   Vitamin D, Ergocalciferol, (DRISDOL) 1.25 MG (50000 UNIT) CAPS capsule Take 1 capsule (50,000 Units total) by mouth every 7 (seven) days. 12 capsule 1   No current facility-administered medications on file prior to visit.   Allergies  Allergen Reactions   Egg-Derived Products Hives and Other (See Comments)    FLU VACCINE: caused hives     Influenza Vaccines Hives    Vaccine from Egg derived product causes Hives   Crestor [Rosuvastatin]     Made her arm feel like it was numb.   Effexor [Venlafaxine]     "felt weird"   Lantus [Insulin Glargine] Other (See Comments)    NOT EFFECTIVE >> sugars increase to >600!   Metformin And Related Swelling    ANGIOEDEMA   Family History  Problem Relation Age of Onset   Diabetes Mother    High Cholesterol Mother    Obesity  Mother    Diabetes Father    Cancer Father        Prostate cancer   Cancer Sister        Cervical cancer   Diabetes Brother    Bipolar disorder Brother    Alcohol abuse Brother    Drug abuse Brother    Diabetes Sister    Diabetes Brother    PE: BP 120/64   Pulse 84   Ht 5\' 2"  (1.575 m)   Wt 245 lb (111.1 kg)   SpO2 98%   BMI 44.81 kg/m  Wt Readings from Last 3 Encounters:  02/04/23 245 lb (111.1 kg)  09/29/22 250 lb 9.6 oz (113.7 kg)  05/22/22 255 lb (115.7 kg)   Constitutional: overweight, in NAD Eyes:  EOMI, no exophthalmos ENT: no neck masses, no cervical lymphadenopathy Cardiovascular: RRR, No MRG Respiratory: CTA B Musculoskeletal: no deformities Skin:no rashes Neurological: no tremor with outstretched hands  ASSESSMENT: 1. DM2, insulin-dependent, uncontrolled, with complications - CKD stage 2 - DR  2. HL  3. Obesity class III  PLAN:  1. Patient with uncontrolled type 2 diabetes, on basal insulin and weekly GLP-1 receptor agonist, to which we added an SGLT2 inhibitor at last visit.  She has previously been on [place] with subsequently yeast infections, but agreed to try Jardiance.  At last visit, sugars were mostly fluctuating within the target range but they were increasing more after lunch and dinner.  We discussed about improving the quality of these meals, and added a low-dose of Jardiance.  I also advised her to reduce the dose of her insulin slightly. CGM interpretation: -At today's visit, we reviewed her CGM downloads: It appears that 94% of values are in target range (goal >70%), while 5% are higher than  180 (goal <25%), and 1% are lower than 70 (goal <4%).  The calculated average blood sugar is 122.  The projected HbA1c for the next 3 months (GMI) is 6.2%. -Reviewing the CGM trends, sugars are fluctuating within the target range, with occasional blood sugars at 70 or slightly lower.  We discussed about possibly reducing the dose of basal insulin,  otherwise, we can continue the current regimen. - I suggested to:  Patient Instructions  Please decrease: - Toujeo 40-42 units daily  Continue: - Ozempic 2 mg weekly - Farxiga 5 mg daily  Please return in 4-5 months.  - we checked her HbA1c: 6.9% (lower) - advised to check sugars at different times of the day - 4x a day, rotating check times - advised for yearly eye exams >> she is UTD - return to clinic in 4-5 months  2. HL -At last OV, LDL was higher than goal, triglycerides were also slightly high: Lab Results  Component Value Date   CHOL 218 (H) 09/29/2022   HDL 46.70 09/29/2022   LDLCALC 137 (H) 09/29/2022   LDLDIRECT 103.0 09/09/2017   TRIG 170.0 (H) 09/29/2022   CHOLHDL 5 09/29/2022  -She continues Lipitor 40 mg daily without side effects  3. Obesity class III -We tried [place] but she developed yeast infections; at last visit, she agreed to retry Jardiance. Reginal Lutes was not approved by her insurance but see if she was able to start Ozempic, which should also help with weight loss -She lost 5 pounds before last visit and 5 more since then  Carlus Pavlov, MD PhD [facility]

## 2023-03-12 ENCOUNTER — Encounter: Payer: Self-pay | Admitting: Internal Medicine

## 2023-04-26 ENCOUNTER — Ambulatory Visit: Payer: 59 | Admitting: Physician Assistant

## 2023-04-26 ENCOUNTER — Encounter: Payer: Self-pay | Admitting: Physician Assistant

## 2023-04-26 ENCOUNTER — Ambulatory Visit (INDEPENDENT_AMBULATORY_CARE_PROVIDER_SITE_OTHER): Payer: 59 | Admitting: Physician Assistant

## 2023-04-26 VITALS — BP 152/82 | HR 84 | Ht 62.0 in | Wt 245.5 lb

## 2023-04-26 DIAGNOSIS — J329 Chronic sinusitis, unspecified: Secondary | ICD-10-CM

## 2023-04-26 DIAGNOSIS — E1122 Type 2 diabetes mellitus with diabetic chronic kidney disease: Secondary | ICD-10-CM | POA: Diagnosis not present

## 2023-04-26 DIAGNOSIS — Z1159 Encounter for screening for other viral diseases: Secondary | ICD-10-CM

## 2023-04-26 DIAGNOSIS — E1169 Type 2 diabetes mellitus with other specified complication: Secondary | ICD-10-CM

## 2023-04-26 DIAGNOSIS — N1831 Chronic kidney disease, stage 3a: Secondary | ICD-10-CM

## 2023-04-26 DIAGNOSIS — E785 Hyperlipidemia, unspecified: Secondary | ICD-10-CM

## 2023-04-26 DIAGNOSIS — Z789 Other specified health status: Secondary | ICD-10-CM

## 2023-04-26 DIAGNOSIS — I10 Essential (primary) hypertension: Secondary | ICD-10-CM

## 2023-04-26 DIAGNOSIS — Z794 Long term (current) use of insulin: Secondary | ICD-10-CM

## 2023-04-26 DIAGNOSIS — E782 Mixed hyperlipidemia: Secondary | ICD-10-CM | POA: Diagnosis not present

## 2023-04-26 DIAGNOSIS — R051 Acute cough: Secondary | ICD-10-CM

## 2023-04-26 DIAGNOSIS — F322 Major depressive disorder, single episode, severe without psychotic features: Secondary | ICD-10-CM

## 2023-04-26 DIAGNOSIS — E559 Vitamin D deficiency, unspecified: Secondary | ICD-10-CM

## 2023-04-26 DIAGNOSIS — Z1211 Encounter for screening for malignant neoplasm of colon: Secondary | ICD-10-CM

## 2023-04-26 MED ORDER — VITAMIN D (ERGOCALCIFEROL) 1.25 MG (50000 UNIT) PO CAPS: 50000.0000 [IU] | ORAL_CAPSULE | ORAL | 1 refills | Status: DC

## 2023-04-26 MED ORDER — AMLODIPINE BESYLATE 2.5 MG PO TABS: 2.5000 mg | ORAL_TABLET | Freq: Every day | ORAL | 0 refills | Status: DC

## 2023-04-26 MED ORDER — ALBUTEROL SULFATE HFA 108 (90 BASE) MCG/ACT IN AERS: 2.0000 | INHALATION_SPRAY | Freq: Four times a day (QID) | RESPIRATORY_TRACT | 0 refills | Status: DC | PRN

## 2023-04-26 NOTE — Progress Notes (Unsigned)
Established Patient Office Visit  Subjective   Patient ID: Jasmine [name], female    DOB: October 22, 1965  Age: 57 y.o. MRN: [id number]  Chief Complaint  Patient presents with   mood follow up     Medication refills - requsting inhaler rx rf  and requesting vit D check     HPI Pt is a 57 yo obese female with T2DM, vitamin D and b12 deficiency, HTN, HLD, MDD who presents to the clinic for follow up and medication refills.   Mood is doing great. No concerns. Pt is not taking zoloft.   Pt is not checking BPs at home. She is not taking benicar/hydrochlorothiazide she suspect it is causing her to lose hair. She is not exercising. She denies any CP, palpitations, headaches or vision changes.   Her diabetes is managed by endocrinology. She is on ozempic, farxiga, and toujeo. She is checking her sugars.   Needs albuterol refill for her intermittent cough. No fever, chills, SOB. Albuterol helps.    Active Ambulatory Problems    Diagnosis Date Noted   Depression 02/09/2010   ALLERGIC RHINITIS 02/09/2010   Obesity, Class III, BMI 40-49.9 (morbid obesity) (HCC) 06/03/2012   Hyperlipidemia 11/27/2013   Primary osteoarthritis of both knees 11/27/2013   Chronic renal insufficiency, stage II (mild) 11/28/2013   Umbilical hernia without obstruction and without gangrene 02/02/2014   Ventral hernia, recurrent 03/28/2014   Essential hypertension 01/09/2015   Erroneous encounter - disregard 03/12/2016   Hyperlipidemia associated with type 2 diabetes mellitus (HCC) 06/17/2016   Anxiety state 06/17/2016   Tobacco use 08/13/2016   Micturition syncope 08/13/2016   Vitamin D deficiency 11/27/2016   B12 deficiency 11/27/2016   Frequent headaches 01/05/2017   Menopause 05/13/2017   Severe episode of recurrent major depressive disorder, without psychotic features (HCC) 05/19/2017   Generalized anxiety disorder 05/19/2017   Severe major depression, single episode, without psychotic features (HCC)  05/26/2017   Type 2 diabetes mellitus with chronic kidney disease, with long-term current use of insulin (HCC) 07/12/2017   Acute stress reaction 03/09/2018   Class 3 severe obesity due to excess calories with serious comorbidity and body mass index (BMI) of 45.0 to 49.9 in adult (HCC) 11/21/2018   Vision changes 08/21/2019   Numbness and tingling of right arm 08/21/2019   Impingement syndrome, shoulder, left 10/19/2019   Statin intolerance 03/17/2021   Right foot pain 05/08/2022   Hypertension goal BP (blood pressure) < 130/80 04/27/2023   Resolved Ambulatory Problems    Diagnosis Date Noted   No Resolved Ambulatory Problems   Past Medical History:  Diagnosis Date   Anxiety    Arthritis    Chronic renal insufficiency    Headache    Hypercholesteremia    Hypertension    Numbness and tingling of right arm and leg    Seasonal allergies    Type II diabetes mellitus (HCC)    Umbilical hernia without mention of obstruction or gangrene    Wears glasses     ROS   See HPI.  Objective:     BP (!) 152/78   Pulse 84   Ht 5\' 2"  (1.575 m)   Wt 245 lb 8 oz (111.4 kg)   SpO2 98%   BMI 44.90 kg/m  BP Readings from Last 3 Encounters:  04/26/23 (!) 152/78  02/04/23 120/64  09/29/22 122/70   Wt Readings from Last 3 Encounters:  04/26/23 245 lb 8 oz (111.4 kg)  02/04/23 245 lb (111.1 kg)  09/29/22 250 lb 9.6 oz (113.7 kg)   ..    04/26/2023    1:47 PM 10/07/2021   10:05 AM 03/10/2021    1:58 PM 08/07/2020    1:08 PM 11/01/2019   10:54 AM  Depression screen PHQ 2/9  Decreased Interest 0 0 0 1 3  Down, Depressed, Hopeless 0 0 0 2 3  PHQ - 2 Score 0 0 0 3 6  Altered sleeping 0   2 2  Tired, decreased energy 0   2 2  Change in appetite 0   2 2  Feeling bad or failure about yourself  1   1 2   Trouble concentrating 1   1 3   Moving slowly or fidgety/restless 0   1 1  Suicidal thoughts 0   0 0  PHQ-9 Score 2   12 18   Difficult doing work/chores Not difficult at all   Somewhat  difficult    ..    04/26/2023    1:47 PM 11/01/2019   10:55 AM 11/16/2018   10:11 AM 03/08/2018    2:14 PM  GAD 7 : Generalized Anxiety Score  Nervous, Anxious, on Edge 1 3 3 3   Control/stop worrying 0 3 3 3   Worry too much - different things 0 2 3 3   Trouble relaxing 1 2 2 3   Restless 1 1 1 3   Easily annoyed or irritable 0 2 3 3   Afraid - awful might happen 1 3 2 3   Total GAD 7 Score 4 16 17 21   Anxiety Difficulty Not difficult at all  Somewhat difficult Somewhat difficult     Lab Results  Component Value Date   HGBA1C 7.3 (A) 09/29/2022    Physical Exam Constitutional:      Appearance: Normal appearance. She is obese.  HENT:     Head: Normocephalic.     Mouth/Throat:     Mouth: Mucous membranes are moist.  Eyes:     Conjunctiva/sclera: Conjunctivae normal.  Neck:     Vascular: No carotid bruit.  Cardiovascular:     Rate and Rhythm: Normal rate and regular rhythm.     Pulses: Normal pulses.     Heart sounds: Normal heart sounds.  Pulmonary:     Effort: Pulmonary effort is normal.     Breath sounds: Normal breath sounds.  Musculoskeletal:     Cervical back: Normal range of motion and neck supple. No tenderness.     Right lower leg: No edema.     Left lower leg: No edema.  Lymphadenopathy:     Cervical: No cervical adenopathy.  Neurological:     General: No focal deficit present.     Mental Status: She is alert and oriented to person, place, and time.  Psychiatric:        Mood and Affect: Mood normal.      The 10-year ASCVD risk score (Arnett DK, et al., 2019) is: 24.6%    Assessment & Plan:  Marland KitchenMarland KitchenCalley was seen today for mood follow up .  Diagnoses and all orders for this visit:  Mixed hyperlipidemia -     Lipid panel -     CMP14+EGFR  Colon cancer screening -     Cologuard  Vitamin D deficiency -     VITAMIN D 25 Hydroxy (Vit-D Deficiency, Fractures) -     Vitamin D, Ergocalciferol, (DRISDOL) 1.25 MG (50000 UNIT) CAPS capsule; Take 1 capsule  (50,000 Units total) by mouth every 7 (seven) days.  Encounter for hepatitis  C screening test for low risk patient -     Hepatitis C Antibody  Acute cough -     albuterol (VENTOLIN HFA) 108 (90 Base) MCG/ACT inhaler; Inhale 2 puffs into the lungs every 6 (six) hours as needed.  Severe major depression, single episode, without psychotic features (HCC)  Type 2 diabetes mellitus with stage 3a chronic kidney disease, with long-term current use of insulin (HCC)  Hypertension goal BP (blood pressure) < 130/80 -     amLODipine (NORVASC) 2.5 MG tablet; Take 1 tablet (2.5 mg total) by mouth daily.  Statin intolerance -     atorvastatin (LIPITOR) 40 MG tablet; Take 1 tablet (40 mg total) by mouth daily.  Hyperlipidemia associated with type 2 diabetes mellitus (HCC) -     atorvastatin (LIPITOR) 40 MG tablet; Take 1 tablet (40 mg total) by mouth daily.   BP not to goal, goal is under 130/80 Pt not taking previous medication Start norvasc 2.5mg  daily Keep BP log Recheck nurse visit in 2-4 weeks and in office in 3 months  Refilled albuterol.   Fasting labs ordered.  Continue to follow up with endocrinology and will try to load all information  Mood is good and off medication and would like to stay off zoloft.   Return in about 3 months (around 07/26/2023) for BP recheck.    Tandy Gaw, PA-C

## 2023-04-27 ENCOUNTER — Encounter: Payer: Self-pay | Admitting: Physician Assistant

## 2023-04-27 DIAGNOSIS — I10 Essential (primary) hypertension: Secondary | ICD-10-CM | POA: Insufficient documentation

## 2023-04-27 MED ORDER — ATORVASTATIN CALCIUM 40 MG PO TABS
40.0000 mg | ORAL_TABLET | Freq: Every day | ORAL | 3 refills | Status: DC
Start: 2023-04-27 — End: 2024-03-14

## 2023-05-24 ENCOUNTER — Encounter: Payer: Self-pay | Admitting: Physician Assistant

## 2023-05-26 ENCOUNTER — Other Ambulatory Visit: Payer: Self-pay | Admitting: Physician Assistant

## 2023-05-26 DIAGNOSIS — R051 Acute cough: Secondary | ICD-10-CM

## 2023-05-28 ENCOUNTER — Other Ambulatory Visit: Payer: Self-pay | Admitting: Internal Medicine

## 2023-05-28 NOTE — Telephone Encounter (Signed)
FSL 3 sensors refill request complete

## 2023-06-08 ENCOUNTER — Encounter: Payer: Self-pay | Admitting: Internal Medicine

## 2023-06-09 ENCOUNTER — Other Ambulatory Visit (HOSPITAL_COMMUNITY): Payer: Self-pay

## 2023-06-09 ENCOUNTER — Ambulatory Visit (INDEPENDENT_AMBULATORY_CARE_PROVIDER_SITE_OTHER): Payer: 59 | Admitting: Internal Medicine

## 2023-06-09 ENCOUNTER — Encounter: Payer: Self-pay | Admitting: Internal Medicine

## 2023-06-09 ENCOUNTER — Telehealth: Payer: Self-pay

## 2023-06-09 VITALS — BP 124/80 | HR 88 | Ht 62.0 in | Wt 244.8 lb

## 2023-06-09 DIAGNOSIS — Z794 Long term (current) use of insulin: Secondary | ICD-10-CM | POA: Diagnosis not present

## 2023-06-09 DIAGNOSIS — E1122 Type 2 diabetes mellitus with diabetic chronic kidney disease: Secondary | ICD-10-CM | POA: Diagnosis not present

## 2023-06-09 DIAGNOSIS — E785 Hyperlipidemia, unspecified: Secondary | ICD-10-CM

## 2023-06-09 DIAGNOSIS — E1169 Type 2 diabetes mellitus with other specified complication: Secondary | ICD-10-CM | POA: Diagnosis not present

## 2023-06-09 MED ORDER — TIRZEPATIDE 5 MG/0.5ML ~~LOC~~ SOAJ: 5.0000 mg | SUBCUTANEOUS | 2 refills | Status: DC

## 2023-06-09 NOTE — Progress Notes (Signed)
Patient ID: Jasmine [name], female   DOB: Oct 29, 1965, 57 y.o.   MRN: [id number]   HPI: Jasmine [name] is a 57 y.o.-year-old female, presenting for f/u for DM2, dx in 07/11/13, but had GDM in 07-12-1991, insulin-dependent, uncontrolled, with complications (CKD, DR).  She has been seen previously by Dr. Everardo All. Last visit with me 4 months ago.  Interim history: No increased urination, no blurry vision, nausea, chest pain. Since last seen, she was not able to use Jardiance due to price and she is now back on Farxiga.  Initially, she had more problems with yeast infections but now resolved. She adjusted her diet and is exercising but she is not seeing significant results in terms of weight loss.  Reviewed HbA1c levels: Lab Results  Component Value Date   HGBA1C 6.9 02/04/2023   HGBA1C 7.3 (A) 09/29/2022   HGBA1C 7.6 (A) 05/22/2022  05/22/2022: HbA1c calculated from fructosamine is 6.4%, better than the directly measured HbA1c.  She is on: - Toujeo 60 >> 54 >> 46 >> 64 >> 60-64 >> 46 >> 40-42 units daily - Ozempic 1  mg weekly - started 03/2021 >> 2 mg weekly - Jardiance 10 mg >> Farxiga 5 mg >> Jardiance 10 mg daily in am Metformin >> tongue swelling. [name] >> repeated yeast inf's (04/2017) and increased urination She was previously on NovoLog but stopped in 07/2021 due to improved sugars. She was previously on [place] and Lantus. She was previously on Trulicity 1.5 mg weekly but had GERD, undigested food; and Wegovy but this was not covered anymore.  She checks her sugars more than 4 times a day with her  freestyle libre 3 CGM:  Previously:  Previously:   Lowest sugar was 60 >> 50s;  she has hypoglycemia awareness in the 80s. Highest sugar was  >600 >> ... 200s.  Glucometer: FreeStyle  Pt's meals are: - Breakfast: coffee or PB sandwich + coffee - Lunch: chicken, veggies - Dinner: bowl of cereal or skips >> now 10 oz of meat and 2 cups of veggies - Snacks: many: M and M, granola  bar, trailmix She was previously going to the weight management clinic, but not recently.  -+ CKD, last BUN/creatinine:  Lab Results  Component Value Date   BUN 14 09/29/2022   BUN 15 09/18/2021   CREATININE 1.21 (H) 09/29/2022   CREATININE 1.20 09/18/2021   Lab Results  Component Value Date   MICRALBCREAT 0.8 09/29/2022   MICRALBCREAT 1.1 09/18/2021   MICRALBCREAT 0.9 09/19/2020   MICRALBCREAT 4.5 06/14/2018   MICRALBCREAT <30 06/16/2016  Prev. On Olmesartan.  -+ HL; last set of lipids: Lab Results  Component Value Date   CHOL 218 (H) 09/29/2022   HDL 46.70 09/29/2022   LDLCALC 137 (H) 09/29/2022   LDLDIRECT 103.0 09/09/2017   TRIG 170.0 (H) 09/29/2022   CHOLHDL 5 09/29/2022  On Crestor 10 mg, changed from Zocor in 09/2020.  - last eye exam was in July 12, 2023: + DR reportedly  - no numbness and tingling in her feet.  Last foot exam 05/22/2022.  Pt has FH of DM in "everybody" - both sides of family: M, F, S's, B's, aunts.  Her husband died of cancer in 2019-07-12.  She has a history of depression and goes to behavioral health.  She has a history of being noncompliant with diabetic medications.  ROS: + see HPI  I reviewed pt's medications, allergies, PMH, social hx, family hx, and changes were documented in the history of present illness.  Otherwise, unchanged from my initial visit note.  Past Medical History:  Diagnosis Date   Anxiety    Arthritis    "knees" (03/28/2014)   Chronic renal insufficiency    stage II (mild)   Depression    Headache    with high blood sugar   Hypercholesteremia    Hypertension    Numbness and tingling of right arm and leg    Seasonal allergies    Type II diabetes mellitus (HCC)    Umbilical hernia without mention of obstruction or gangrene    Wears glasses    Past Surgical History:  Procedure Laterality Date   INSERTION OF MESH N/A 03/28/2014   Procedure: INSERTION OF MESH;  Surgeon: Axel Filler, MD;  Location: [facility] OR;  Service: General;   Laterality: N/A;   INSERTION OF MESH N/A 05/08/2015   Procedure: INSERTION OF MESH;  Surgeon: Axel Filler, MD;  Location: [facility] OR;  Service: General;  Laterality: N/A;   LAPAROSCOPIC LYSIS OF ADHESIONS N/A 05/08/2015   Procedure: LAPAROSCOPIC LYSIS OF ADHESIONS;  Surgeon: Axel Filler, MD;  Location: [facility] OR;  Service: General;  Laterality: N/A;   MULTIPLE TOOTH EXTRACTIONS  ~ 2003   TONSILLECTOMY     UMBILICAL HERNIA REPAIR  1973   VENTRAL HERNIA REPAIR N/A 03/28/2014   Procedure: LAPAROSCOPIC VENTRAL HERNIA;  Surgeon: Axel Filler, MD;  Location: [facility] OR;  Service: General;  Laterality: N/A;   VENTRAL HERNIA REPAIR N/A 05/08/2015   Procedure: LAPAROSCOPIC VENTRAL HERNIA REPAIR WITH MESH;  Surgeon: Axel Filler, MD;  Location: [facility] OR;  Service: General;  Laterality: N/A;   Social History   Socioeconomic History   Marital status: Widowed    Spouse name: Florencia Reasons   Number of children: 1   Years of education: Not on file   Highest education level: Not on file  Occupational History   Not on file  Tobacco Use   Smoking status: Former    Current packs/day: 0.25    Average packs/day: 0.3 packs/day for 26.0 years (6.5 ttl pk-yrs)    Types: Cigarettes   Smokeless tobacco: Never  Vaping Use   Vaping status: Never Used  Substance and Sexual Activity   Alcohol use: Yes    Comment: "have a drink a few times/year"   Drug use: No   Sexual activity: Not Currently  Other Topics Concern   Not on file  Social History Narrative   Not on file   Social Determinants of Health   Financial Resource Strain: Medium Risk (04/26/2023)   Overall Financial Resource Strain (CARDIA)    Difficulty of Paying Living Expenses: Somewhat hard  Food Insecurity: No Food Insecurity (04/26/2023)   Hunger Vital Sign    Worried About Running Out of Food in the Last Year: Never true    Ran Out of Food in the Last Year: Never true  Transportation Needs: No Transportation Needs (04/26/2023)   PRAPARE -  Administrator, Civil Service (Medical): No    Lack of Transportation (Non-Medical): No  Physical Activity: Insufficiently Active (04/26/2023)   Exercise Vital Sign    Days of Exercise per Week: 1 day    Minutes of Exercise per Session: 30 min  Stress: No Stress Concern Present (04/26/2023)   [employer] of Occupational Health - Occupational Stress Questionnaire    Feeling of Stress : Not at all  Social Connections: Moderately Integrated (04/26/2023)   Social Connection and Isolation Panel [NHANES]    Frequency of Communication with Friends  and Family: More than three times a week    Frequency of Social Gatherings with Friends and Family: Once a week    Attends Religious Services: More than 4 times per year    Active Member of [employer] or Organizations: Yes    Attends Banker Meetings: More than 4 times per year    Marital Status: Widowed  Intimate Partner Violence: Unknown (11/14/2021)   [facility], [facility]   HITS    Physically Hurt: Not on file    Insult or Talk Down To: Not on file    Threaten Physical Harm: Not on file    Scream or Curse: Not on file   Current Outpatient Medications on File Prior to Visit  Medication Sig Dispense Refill   albuterol (VENTOLIN HFA) 108 (90 Base) MCG/ACT inhaler INHALE 2 PUFFS BY MOUTH EVERY 6 HOURS AS NEEDED 9 g 0   amLODipine (NORVASC) 2.5 MG tablet Take 1 tablet (2.5 mg total) by mouth daily. 90 tablet 0   atorvastatin (LIPITOR) 40 MG tablet Take 1 tablet (40 mg total) by mouth daily. 90 tablet 3   Continuous Blood Gluc Sensor (FREESTYLE LIBRE 2 SENSOR) MISC Use as instructed to be changed every 14 days 6 each 3   Continuous Glucose Sensor (FREESTYLE LIBRE 3 SENSOR) MISC USE 1 SENSOR  EVERY 14 DAYS 2 each 0   dapagliflozin propanediol (FARXIGA) 5 MG TABS tablet Take 1 tablet (5 mg total) by mouth daily before breakfast. 90 tablet 3   insulin glargine, 2 Unit Dial, (TOUJEO MAX SOLOSTAR) 300 UNIT/ML  Solostar Pen INJECT 40-42 UNITS UNDER THE SKIN ONCE A DAY 12 mL 3   Insulin Pen Needle 32G X 4 MM MISC Use 4x a day 300 each 3   meloxicam (MOBIC) 7.5 MG tablet Take 1 tablet (7.5 mg total) by mouth daily. 90 tablet 3   Semaglutide, 2 MG/DOSE, (OZEMPIC, 2 MG/DOSE,) 8 MG/3ML SOPN Inject 2 mg into the skin once a week. 9 mL 3   Vitamin D, Ergocalciferol, (DRISDOL) 1.25 MG (50000 UNIT) CAPS capsule Take 1 capsule (50,000 Units total) by mouth every 7 (seven) days. 12 capsule 1   No current facility-administered medications on file prior to visit.   Allergies  Allergen Reactions   Egg-Derived Products Hives and Other (See Comments)    FLU VACCINE: caused hives     Influenza Vaccines Hives    Vaccine from Egg derived product causes Hives   Crestor [Rosuvastatin]     Made her arm feel like it was numb.   Effexor [Venlafaxine]     "felt weird"   Lantus [Insulin Glargine] Other (See Comments)    NOT EFFECTIVE >> sugars increase to >600!   Metformin And Related Swelling    ANGIOEDEMA   Family History  Problem Relation Age of Onset   Diabetes Mother    High Cholesterol Mother    Obesity Mother    Diabetes Father    Cancer Father        Prostate cancer   Cancer Sister        Cervical cancer   Diabetes Brother    Bipolar disorder Brother    Alcohol abuse Brother    Drug abuse Brother    Diabetes Sister    Diabetes Brother    PE: BP 124/80   Pulse 88   Ht 5\' 2"  (1.575 m)   Wt 244 lb 12.8 oz (111 kg)   SpO2 98%   BMI 44.77  kg/m  Wt Readings from Last 3 Encounters:  06/09/23 244 lb 12.8 oz (111 kg)  04/26/23 245 lb 8 oz (111.4 kg)  02/04/23 245 lb (111.1 kg)   Constitutional: overweight, in NAD Eyes:  EOMI, no exophthalmos ENT: no neck masses, no cervical lymphadenopathy Cardiovascular: RRR, No MRG Respiratory: CTA B Musculoskeletal: no deformities Skin:no rashes Neurological: no tremor with outstretched hands  ASSESSMENT: 1. DM2, insulin-dependent, uncontrolled, with  complications - CKD stage 2 - DR  2. HL  3. Obesity class III  PLAN:  1. Patient with history of uncontrolled type 2 diabetes on basal insulin and weekly GLP-1 receptor agonist, and SGLT2 inhibitor, with improving control.  At last visit, HbA1c was lower, at 6.9% and sugars were fluctuating within the target range with occasional values at 70 or slightly lower.  We discussed about possibly reducing the dose of basal insulin, but did not change the regimen otherwise. CGM interpretation:4% -At today's visit, we reviewed her CGM downloads: It appears that 95% of values are in target range (goal >70%), while 4% are higher than 180 (goal <25%), and 1% are lower than 70 (goal <4%).  The calculated average blood sugar is 120.  The projected HbA1c for the next 3 months (GMI) is 6.2%. -Reviewing the CGM trends, sugars are fluctuating within the target range, however, she has low blood sugars throughout the day send night so I advised her to reduce the dose of her Toujeo.  Since she is not seeing a lot of improvement in her weight while on Ozempic despite changes in diet and exercise, she is inquiring about possibly switching to [facility].  We discussed that this is stronger than Ozempic.  I sent a prescription for the 5 mg dose to her pharmacy and advised her to let me know if she tolerates this well, in which case, we will increase the dose.  I also advised her that we may need to decrease the Toujeo dose even more on Mounjaro. - I suggested to:  Patient Instructions  Please continue: - Jardiance 10 mg daily  Please decrease: - Toujeo 36 units daily  Try to change from Ozempic to: - Mounjaro 5 mg weekly  Let me know if we can increase the dose afterwards.  Please return in 4 months.  - we checked her HbA1c: 6.9% (stable) - advised to check sugars at different times of the day - 4x a day, rotating check times - advised for yearly eye exams >> she is UTD - return to clinic in 4 months  2.  HL -Reviewed latest lipid panel from 09/2022: LDL above target, triglycerides also slightly high: Lab Results  Component Value Date   CHOL 218 (H) 09/29/2022   HDL 46.70 09/29/2022   LDLCALC 137 (H) 09/29/2022   LDLDIRECT 103.0 09/09/2017   TRIG 170.0 (H) 09/29/2022   CHOLHDL 5 09/29/2022  -She is on Lipitor 40 mg daily without side effects  3. Obesity class III -We tried [place] but she developed yeast infections; at last visit, she agreed to retry Jardiance. Reginal Lutes was not approved by her insurance but she was able to start Ozempic -She lost 10 pounds before the last 2 visits combined -She lost 1 pound since last visit -per her preference, we will try to switch to Ozempic to [facility] if covered by her insurance.  She tells me that she inquired with her insurance and she was told that Greggory Keen would be covered with a PA  Carlus Pavlov, MD PhD [facility]

## 2023-06-09 NOTE — Patient Instructions (Addendum)
Please continue: - Jardiance 10 mg daily  Please decrease: - Toujeo 36 units daily  Try to change from Ozempic to: - Mounjaro 5 mg weekly  Let me know if we can increase the dose afterwards.  Please return in 4 months.

## 2023-06-09 NOTE — Telephone Encounter (Signed)
Patient needs a PA for [facility]

## 2023-06-14 ENCOUNTER — Telehealth: Payer: Self-pay

## 2023-06-14 ENCOUNTER — Other Ambulatory Visit (HOSPITAL_COMMUNITY): Payer: Self-pay

## 2023-06-14 NOTE — Telephone Encounter (Signed)
Pharmacy Patient Advocate Encounter   Received notification from Pt Calls Messages that prior authorization for Jasmine [name] is required/requested.   Insurance verification completed.   The patient is insured through [facility] .   Per test claim: Refill too soon. PA is not needed at this time. Medication was filled 06/09/2023. Next eligible fill date is 06/30/23.

## 2023-06-28 ENCOUNTER — Other Ambulatory Visit: Payer: Self-pay | Admitting: Internal Medicine

## 2023-06-28 NOTE — Telephone Encounter (Signed)
Freestyle Libre plus kit request sent

## 2023-07-26 ENCOUNTER — Ambulatory Visit: Payer: 59 | Admitting: Physician Assistant

## 2023-07-28 ENCOUNTER — Other Ambulatory Visit: Payer: Self-pay | Admitting: Internal Medicine

## 2023-08-31 ENCOUNTER — Other Ambulatory Visit: Payer: Self-pay | Admitting: Medical Genetics

## 2023-09-12 ENCOUNTER — Other Ambulatory Visit: Payer: Self-pay | Admitting: Internal Medicine

## 2023-10-13 ENCOUNTER — Encounter: Payer: Self-pay | Admitting: Internal Medicine

## 2023-10-13 ENCOUNTER — Ambulatory Visit (INDEPENDENT_AMBULATORY_CARE_PROVIDER_SITE_OTHER): Payer: 59 | Admitting: Internal Medicine

## 2023-10-13 ENCOUNTER — Other Ambulatory Visit: Payer: Self-pay | Admitting: Internal Medicine

## 2023-10-13 VITALS — BP 136/70 | HR 96 | Ht 62.0 in | Wt 237.0 lb

## 2023-10-13 DIAGNOSIS — E785 Hyperlipidemia, unspecified: Secondary | ICD-10-CM

## 2023-10-13 DIAGNOSIS — E1169 Type 2 diabetes mellitus with other specified complication: Secondary | ICD-10-CM | POA: Diagnosis not present

## 2023-10-13 DIAGNOSIS — Z794 Long term (current) use of insulin: Secondary | ICD-10-CM

## 2023-10-13 DIAGNOSIS — E1122 Type 2 diabetes mellitus with diabetic chronic kidney disease: Secondary | ICD-10-CM

## 2023-10-13 LAB — POCT GLYCOSYLATED HEMOGLOBIN (HGB A1C): Hemoglobin A1C: 6.9 % — AB (ref 4.0–5.6)

## 2023-10-13 NOTE — Patient Instructions (Addendum)
 Please continue: - Jardiance 10 mg daily - Mounjaro 5 mg weekly  Please decrease: - Toujeo 30 units daily  Try Miralax 1 capful at night daily. You can also try Senna, Dulcolax.  Please return in 4 months.

## 2023-10-13 NOTE — Progress Notes (Signed)
 Patient ID: Jasmine [name], female   DOB: 1966-04-17, 58 y.o.   MRN: [id number]   HPI: Jasmine [name] is a 58 y.o.-year-old female, presenting for f/u for DM2, dx in 11-04-2012, but had GDM in 1990-11-05, insulin-dependent, uncontrolled, with complications (CKD, DR).  She has been seen previously by Dr. Everardo All. Last visit with me 4 months ago. PCP - in [place].  Interim history: No increased urination, no blurry vision, nausea, chest pain. She has constipation and sulfur eructations after switching to [facility].  She did not have this with Ozempic and even had more frequent stools with this.  Reviewed HbA1c levels: 06/09/2023: HbA1c 6.9% Lab Results  Component Value Date   HGBA1C 6.9 02/04/2023   HGBA1C 7.3 (A) 09/29/2022   HGBA1C 7.6 (A) 05/22/2022  05/22/2022: HbA1c calculated from fructosamine is 6.4%, better than the directly measured HbA1c.  She is on: - Toujeo 60 >> 54 >> 46 >> 64 >> 60-64 >> 46 >> 40-42 >> 36 units daily - Ozempic 1  mg weekly - started 03/2021 >> 2 mg weekly >> Mounjaro 5 mg weekly - Jardiance 10 mg >> Farxiga 5 mg >> Jardiance 10 mg daily in am Metformin >> tongue swelling. [name] >> repeated yeast inf's (04/2017) and increased urination She was previously on NovoLog but stopped in 07/2021 due to improved sugars. She was previously on [place] and Lantus. She was previously on Trulicity 1.5 mg weekly but had GERD, undigested food; and Wegovy but this was not covered anymore.  She checks her sugars more than 4 times a day with her  freestyle libre 3 CGM:  Previously:  Previously:  Lowest sugar was 60 >> 50s >> 53;  she has hypoglycemia awareness in the 80s. Highest sugar was  >600 >> ... 200s >> 212 (pancakes + syrup).  Glucometer: FreeStyle  Pt's meals are: - Breakfast: coffee or PB sandwich + coffee - Lunch: chicken, veggies - Dinner: bowl of cereal or skips >> now 10 oz of meat and 2 cups of veggies - Snacks: many: M and M, granola bar, trailmix She  was previously going to the weight management clinic, but not recently.  -+ CKD, last BUN/creatinine:  Lab Results  Component Value Date   BUN 14 09/29/2022   BUN 15 09/18/2021   CREATININE 1.21 (H) 09/29/2022   CREATININE 1.20 09/18/2021   Lab Results  Component Value Date   MICRALBCREAT 0.8 09/29/2022   MICRALBCREAT 1.1 09/18/2021   MICRALBCREAT 0.9 09/19/2020   MICRALBCREAT 4.5 06/14/2018   MICRALBCREAT <30 06/16/2016  Prev. On Olmesartan.  -+ HL; last set of lipids: Lab Results  Component Value Date   CHOL 218 (H) 09/29/2022   HDL 46.70 09/29/2022   LDLCALC 137 (H) 09/29/2022   LDLDIRECT 103.0 09/09/2017   TRIG 170.0 (H) 09/29/2022   CHOLHDL 5 09/29/2022  On Crestor 10 mg, changed from Zocor in 09/2020.  - last eye exam was in 11/05/2022: + DR reportedly  - no numbness and tingling in her feet.  Last foot exam 06/09/2023.  Pt has FH of DM in "everybody" - both sides of family: M, F, S's, B's, aunts.  Her husband died of cancer in 05-Nov-2018.  She has a history of depression and goes to behavioral health.  She has a history of being noncompliant with diabetic medications.  ROS: + see HPI  I reviewed pt's medications, allergies, PMH, social hx, family hx, and changes were documented in the history of present illness. Otherwise, unchanged from my initial  visit note.  Past Medical History:  Diagnosis Date   Anxiety    Arthritis    "knees" (03/28/2014)   Chronic renal insufficiency    stage II (mild)   Depression    Headache    with high blood sugar   Hypercholesteremia    Hypertension    Numbness and tingling of right arm and leg    Seasonal allergies    Type II diabetes mellitus (HCC)    Umbilical hernia without mention of obstruction or gangrene    Wears glasses    Past Surgical History:  Procedure Laterality Date   INSERTION OF MESH N/A 03/28/2014   Procedure: INSERTION OF MESH;  Surgeon: Axel Filler, MD;  Location: [facility] OR;  Service: General;  Laterality: N/A;    INSERTION OF MESH N/A 05/08/2015   Procedure: INSERTION OF MESH;  Surgeon: Axel Filler, MD;  Location: [facility] OR;  Service: General;  Laterality: N/A;   LAPAROSCOPIC LYSIS OF ADHESIONS N/A 05/08/2015   Procedure: LAPAROSCOPIC LYSIS OF ADHESIONS;  Surgeon: Axel Filler, MD;  Location: [facility] OR;  Service: General;  Laterality: N/A;   MULTIPLE TOOTH EXTRACTIONS  ~ 2003   TONSILLECTOMY     UMBILICAL HERNIA REPAIR  1973   VENTRAL HERNIA REPAIR N/A 03/28/2014   Procedure: LAPAROSCOPIC VENTRAL HERNIA;  Surgeon: Axel Filler, MD;  Location: [facility] OR;  Service: General;  Laterality: N/A;   VENTRAL HERNIA REPAIR N/A 05/08/2015   Procedure: LAPAROSCOPIC VENTRAL HERNIA REPAIR WITH MESH;  Surgeon: Axel Filler, MD;  Location: [facility] OR;  Service: General;  Laterality: N/A;   Social History   Socioeconomic History   Marital status: Widowed    Spouse name: Florencia Reasons   Number of children: 1   Years of education: Not on file   Highest education level: Not on file  Occupational History   Not on file  Tobacco Use   Smoking status: Former    Current packs/day: 0.25    Average packs/day: 0.3 packs/day for 26.0 years (6.5 ttl pk-yrs)    Types: Cigarettes   Smokeless tobacco: Never  Vaping Use   Vaping status: Never Used  Substance and Sexual Activity   Alcohol use: Yes    Comment: "have a drink a few times/year"   Drug use: No   Sexual activity: Not Currently  Other Topics Concern   Not on file  Social History Narrative   Not on file   Social Drivers of Health   Financial Resource Strain: Medium Risk (04/26/2023)   Overall Financial Resource Strain (CARDIA)    Difficulty of Paying Living Expenses: Somewhat hard  Food Insecurity: No Food Insecurity (04/26/2023)   Hunger Vital Sign    Worried About Running Out of Food in the Last Year: Never true    Ran Out of Food in the Last Year: Never true  Transportation Needs: No Transportation Needs (04/26/2023)   PRAPARE - Doctor, general practice (Medical): No    Lack of Transportation (Non-Medical): No  Physical Activity: Insufficiently Active (04/26/2023)   Exercise Vital Sign    Days of Exercise per Week: 1 day    Minutes of Exercise per Session: 30 min  Stress: No Stress Concern Present (04/26/2023)   [employer] of Occupational Health - Occupational Stress Questionnaire    Feeling of Stress : Not at all  Social Connections: Moderately Integrated (04/26/2023)   Social Connection and Isolation Panel [NHANES]    Frequency of Communication with Friends and Family: More than three  times a week    Frequency of Social Gatherings with Friends and Family: Once a week    Attends Religious Services: More than 4 times per year    Active Member of [employer] or Organizations: Yes    Attends Banker Meetings: More than 4 times per year    Marital Status: Widowed  Intimate Partner Violence: Unknown (11/14/2021)   [facility] from [facility]   HITS    Physically Hurt: Not on file    Insult or Talk Down To: Not on file    Threaten Physical Harm: Not on file    Scream or Curse: Not on file   Current Outpatient Medications on File Prior to Visit  Medication Sig Dispense Refill   albuterol (VENTOLIN HFA) 108 (90 Base) MCG/ACT inhaler INHALE 2 PUFFS BY MOUTH EVERY 6 HOURS AS NEEDED 9 g 0   amLODipine (NORVASC) 2.5 MG tablet Take 1 tablet (2.5 mg total) by mouth daily. 90 tablet 0   atorvastatin (LIPITOR) 40 MG tablet Take 1 tablet (40 mg total) by mouth daily. 90 tablet 3   Continuous Blood Gluc Sensor (FREESTYLE LIBRE 2 SENSOR) MISC Use as instructed to be changed every 14 days 6 each 3   Continuous Glucose Sensor (FREESTYLE LIBRE 3 PLUS SENSOR) MISC CHANGE EVERY 15 DAYS 6 each 3   insulin glargine, 2 Unit Dial, (TOUJEO MAX SOLOSTAR) 300 UNIT/ML Solostar Pen INJECT 40-42 UNITS UNDER THE SKIN ONCE A DAY 12 mL 3   Insulin Pen Needle 32G X 4 MM MISC Use 4x a day 300 each 3   JARDIANCE 10 MG TABS  tablet Take 10 mg by mouth every morning.     meloxicam (MOBIC) 7.5 MG tablet Take 1 tablet (7.5 mg total) by mouth daily. 90 tablet 3   MOUNJARO 5 MG/0.5ML Pen INJECT 5 MG INTO THE SKIN ONCE A WEEK 4 mL 0   Vitamin D, Ergocalciferol, (DRISDOL) 1.25 MG (50000 UNIT) CAPS capsule Take 1 capsule (50,000 Units total) by mouth every 7 (seven) days. 12 capsule 1   No current facility-administered medications on file prior to visit.   Allergies  Allergen Reactions   Egg-Derived Products Hives and Other (See Comments)    FLU VACCINE: caused hives     Influenza Vaccines Hives    Vaccine from Egg derived product causes Hives   Crestor [Rosuvastatin]     Made her arm feel like it was numb.   Effexor [Venlafaxine]     "felt weird"   Lantus [Insulin Glargine] Other (See Comments)    NOT EFFECTIVE >> sugars increase to >600!   Metformin And Related Swelling    ANGIOEDEMA   Family History  Problem Relation Age of Onset   Diabetes Mother    High Cholesterol Mother    Obesity Mother    Diabetes Father    Cancer Father        Prostate cancer   Cancer Sister        Cervical cancer   Diabetes Brother    Bipolar disorder Brother    Alcohol abuse Brother    Drug abuse Brother    Diabetes Sister    Diabetes Brother    PE: BP 136/70   Pulse 96   Ht 5\' 2"  (1.575 m)   Wt 237 lb (107.5 kg)   SpO2 96%   BMI 43.35 kg/m  Wt Readings from Last 3 Encounters:  10/13/23 237 lb (107.5 kg)  06/09/23 244 lb 12.8 oz (111  kg)  04/26/23 245 lb 8 oz (111.4 kg)   Constitutional: overweight, in NAD Eyes:  EOMI, no exophthalmos ENT: no neck masses, no cervical lymphadenopathy Cardiovascular: tachycardia, RR, No MRG Respiratory: CTA B Musculoskeletal: no deformities Skin:no rashes Neurological: no tremor with outstretched hands  ASSESSMENT: 1. DM2, insulin-dependent, uncontrolled, with complications - CKD stage 2 - DR  2. HL  3. Obesity class III  PLAN:  1. Patient with history of  uncontrolled type 2 diabetes, on basal insulin along with SGLT2 inhibitor and weekly GLP-1/GIP receptor agonist, with stable control.  At last visit, HbA1c was stable, at 6.9% and sugars were fluctuating within the target range.  She had low blood sugars throughout the day and also at night so we reduced her Toujeo dose.  Since she did not see a significant improvement in her blood sugars while on Ozempic, she was interested in trying [place].  I advised her to switch to 5 mg of Mounjaro. CGM interpretation:4% -At today's visit, we reviewed her CGM downloads: It appears that 93% of values are in target range (goal >70%), while 5% are higher than 180 (goal <25%), and 2% are lower than 70 (goal <4%).  The calculated average blood sugar is 122.  The projected HbA1c for the next 3 months (GMI) is 6.2%. -Reviewing the CGM trends, sugars appear to be excellent, fluctuating within the target range with only occasional hyperglycemic values after meals and occasional lows especially in the evening and at night.  Will go ahead and reduce her Toujeo dose more. -She reports constipation with Mounjaro and we discussed about using MiraLAX, senna, or Dulcolax to help with this.  She has some stool for irritations, which could be related to constipation.  We discussed that she may try Mylanta, if this does not resolve after resolving constipation.  At next visit, if she still has the symptoms, we may need to switch back from [facility] to Ozempic.  I am reticent to increase her Mounjaro dose at today's visit, but it does not look like we absolutely have to do this as the blood sugars are mostly at goal, with a predicted HbA1c of 6.2% judging by the last 2 weeks of sensor data, and also with a 7 pound weight loss since last visit.  Decreasing the insulin dose should also further help with weight loss - I suggested to:  Patient Instructions  Please continue: - Jardiance 10 mg daily - Mounjaro 5 mg weekly  Please  decrease: - Toujeo 30 units daily  Try Miralax 1 capful at night daily. You can also try Senna, Dulcolax.  Please return in 4 months.  - we checked her HbA1c: 6.9% (stable) - advised to check sugars at different times of the day - 4x a day, rotating check times - advised for yearly eye exams >> she is UTD - will check an ACR today - return to clinic in 4 months  2. HL -Latest lipid panel was reviewed from 09/2022: LDL high, triglycerides also slightly elevated: Lab Results  Component Value Date   CHOL 218 (H) 09/29/2022   HDL 46.70 09/29/2022   LDLCALC 137 (H) 09/29/2022   LDLDIRECT 103.0 09/09/2017   TRIG 170.0 (H) 09/29/2022   CHOLHDL 5 09/29/2022  -She continues on Lipitor 40 mg daily-no side effects  3. Obesity class III -We tried [place] but she developed yeast infections; at last visit, she agreed to retry Jardiance. -Wegovy was not approved by her insurance but she was able to start Ozempic -She  gained 1 pound before last visit, previously lost 10 -At last visit she wanted to switch from Ozempic to [place].  We were able to switch since then. -Will also decrease the dose of her insulin now, which should further help with weight loss  Carlus Pavlov, MD PhD [facility] Endocrinology

## 2023-10-13 NOTE — Addendum Note (Signed)
 Addended by: Pollie Meyer on: 10/13/2023 11:20 AM   Modules accepted: Orders

## 2023-10-14 ENCOUNTER — Encounter: Payer: Self-pay | Admitting: Internal Medicine

## 2023-10-14 LAB — MICROALBUMIN / CREATININE URINE RATIO
Creatinine, Urine: 140 mg/dL (ref 20–275)
Microalb Creat Ratio: 1 mg/g{creat} (ref <30)
Microalb, Ur: 0.2 mg/dL

## 2023-10-25 ENCOUNTER — Encounter: Payer: Self-pay | Admitting: Internal Medicine

## 2023-10-26 ENCOUNTER — Other Ambulatory Visit: Payer: Self-pay | Admitting: Internal Medicine

## 2023-10-26 MED ORDER — TIRZEPATIDE 7.5 MG/0.5ML ~~LOC~~ SOAJ: 7.5000 mg | SUBCUTANEOUS | 3 refills | Status: DC

## 2023-11-05 ENCOUNTER — Other Ambulatory Visit: Payer: Self-pay | Admitting: Internal Medicine

## 2024-01-21 ENCOUNTER — Encounter: Payer: Self-pay | Admitting: Internal Medicine

## 2024-01-24 ENCOUNTER — Encounter: Payer: Self-pay | Admitting: Sports Medicine

## 2024-01-24 ENCOUNTER — Other Ambulatory Visit (INDEPENDENT_AMBULATORY_CARE_PROVIDER_SITE_OTHER)

## 2024-01-24 ENCOUNTER — Ambulatory Visit (INDEPENDENT_AMBULATORY_CARE_PROVIDER_SITE_OTHER): Admitting: Sports Medicine

## 2024-01-24 DIAGNOSIS — M170 Bilateral primary osteoarthritis of knee: Secondary | ICD-10-CM | POA: Diagnosis not present

## 2024-01-24 MED ORDER — TRIAMCINOLONE ACETONIDE 40 MG/ML IJ SUSP
40.0000 mg | Freq: Once | INTRAMUSCULAR | Status: AC
Start: 2024-01-24 — End: 2024-01-24
  Administered 2024-01-24: 40 mg via INTRAMUSCULAR

## 2024-01-24 MED ORDER — MOUNJARO 10 MG/0.5ML ~~LOC~~ SOAJ: 10.0000 mg | SUBCUTANEOUS | 1 refills | Status: DC

## 2024-01-24 NOTE — Assessment & Plan Note (Signed)
 This is a very pleasant 58 year old female, she has known chronic knee pain with osteoarthritis, her last injection was in May 2023, she is now having recurrence of pain, repeat left knee joint injection. She is requesting some Flexeril  refill some happy to do this, she was good to have FMLA paperwork filled out however she does not have that with her today and will bring it in another appointment.

## 2024-01-24 NOTE — Addendum Note (Signed)
 Addended by: OLIVA-AVELLANEDA, Larya Charpentier L on: 01/24/2024 04:35 PM   Modules accepted: Orders

## 2024-01-24 NOTE — Progress Notes (Signed)
    Procedures performed today:    Procedure: Real-time Ultrasound Guided injection of the left knee Device: Samsung HS60  Verbal informed consent obtained.  Time-out conducted.  Noted no overlying erythema, induration, or other signs of local infection.  Skin prepped in a sterile fashion.  Local anesthesia: Topical Ethyl chloride.  With sterile technique and under real time ultrasound guidance: Trace effusion noted, 1 cc Kenalog  40, 2 cc lidocaine , 2 cc bupivacaine  injected easily Completed without difficulty  Advised to call if fevers/chills, erythema, induration, drainage, or persistent bleeding.  Images permanently stored and available for review in PACS.  Impression: Technically successful ultrasound guided injection.  Independent interpretation of notes and tests performed by another provider:   None.  Brief History, Exam, Impression, and Recommendations:    Primary osteoarthritis of both knees This is a very pleasant 58 year old female, she has known chronic knee pain with osteoarthritis, her last injection was in May 2023, she is now having recurrence of pain, repeat left knee joint injection. She is requesting some Flexeril  refill some happy to do this, she was good to have FMLA paperwork filled out however she does not have that with her today and will bring it in another appointment.    ____________________________________________ Joselyn Nicely. Sandy Crumb, M.D., ABFM., CAQSM., AME. Primary Care and Sports Medicine [facility]  Adjunct Professor of [facility] Medicine  [employer]  Restaurant manager, fast food

## 2024-01-28 ENCOUNTER — Encounter: Payer: Self-pay | Admitting: Sports Medicine

## 2024-01-28 MED ORDER — CYCLOBENZAPRINE HCL 10 MG PO TABS: ORAL_TABLET | ORAL | 0 refills | Status: AC

## 2024-02-01 ENCOUNTER — Telehealth: Payer: Self-pay | Admitting: Physician Assistant

## 2024-02-01 NOTE — Telephone Encounter (Signed)
 Paperwork has been faxed and confirmation has been received. A copy of FMLA paperwork has also been sent to scan. Patient has been made aware.

## 2024-02-01 NOTE — Telephone Encounter (Signed)
 Copied from CRM [id number]. Topic: General - Other >> Feb 01, 2024 12:34 PM Adrianna P wrote: Reason for CRM: Patients wants fmla papers faxed to [phone]  ,if any questions or concern please call patient at [phone]

## 2024-02-07 ENCOUNTER — Other Ambulatory Visit: Payer: Self-pay | Admitting: Internal Medicine

## 2024-02-07 DIAGNOSIS — Z794 Long term (current) use of insulin: Secondary | ICD-10-CM

## 2024-02-09 MED ORDER — TOUJEO MAX SOLOSTAR 300 UNIT/ML ~~LOC~~ SOPN: 30.0000 [IU] | PEN_INJECTOR | Freq: Every day | SUBCUTANEOUS | 3 refills | Status: DC

## 2024-02-09 NOTE — Addendum Note (Signed)
 Addended by: CLEOTILDE ROLIN RAMAN on: 02/09/2024 04:14 PM   Modules accepted: Orders

## 2024-02-16 ENCOUNTER — Ambulatory Visit: Admitting: Internal Medicine

## 2024-02-21 ENCOUNTER — Encounter (INDEPENDENT_AMBULATORY_CARE_PROVIDER_SITE_OTHER): Admitting: Sports Medicine

## 2024-02-21 DIAGNOSIS — M79671 Pain in right foot: Secondary | ICD-10-CM | POA: Diagnosis not present

## 2024-02-22 NOTE — Telephone Encounter (Signed)

## 2024-03-10 ENCOUNTER — Ambulatory Visit (INDEPENDENT_AMBULATORY_CARE_PROVIDER_SITE_OTHER): Admitting: Internal Medicine

## 2024-03-10 ENCOUNTER — Encounter: Payer: Self-pay | Admitting: Internal Medicine

## 2024-03-10 VITALS — BP 122/70 | HR 86 | Ht 62.0 in | Wt 223.8 lb

## 2024-03-10 DIAGNOSIS — E785 Hyperlipidemia, unspecified: Secondary | ICD-10-CM

## 2024-03-10 DIAGNOSIS — E66813 Obesity, class 3: Secondary | ICD-10-CM | POA: Diagnosis not present

## 2024-03-10 DIAGNOSIS — Z7984 Long term (current) use of oral hypoglycemic drugs: Secondary | ICD-10-CM

## 2024-03-10 DIAGNOSIS — E1169 Type 2 diabetes mellitus with other specified complication: Secondary | ICD-10-CM | POA: Diagnosis not present

## 2024-03-10 DIAGNOSIS — Z789 Other specified health status: Secondary | ICD-10-CM

## 2024-03-10 DIAGNOSIS — E1122 Type 2 diabetes mellitus with diabetic chronic kidney disease: Secondary | ICD-10-CM | POA: Diagnosis not present

## 2024-03-10 DIAGNOSIS — Z794 Long term (current) use of insulin: Secondary | ICD-10-CM

## 2024-03-10 DIAGNOSIS — Z7985 Long-term (current) use of injectable non-insulin antidiabetic drugs: Secondary | ICD-10-CM

## 2024-03-10 LAB — POCT GLYCOSYLATED HEMOGLOBIN (HGB A1C): Hemoglobin A1C: 6.8 % — AB (ref 4.0–5.6)

## 2024-03-10 MED ORDER — TOUJEO MAX SOLOSTAR 300 UNIT/ML ~~LOC~~ SOPN: 24.0000 [IU] | PEN_INJECTOR | Freq: Every day | SUBCUTANEOUS | Status: AC

## 2024-03-10 MED ORDER — TIRZEPATIDE 7.5 MG/0.5ML ~~LOC~~ SOAJ: 7.5000 mg | SUBCUTANEOUS | 3 refills | Status: DC

## 2024-03-10 NOTE — Patient Instructions (Addendum)
 Please continue: - Jardiance  10 mg daily - Mounjaro  5 mg weekly  Try to reduce: - Toujeo  24 units daily  Please return in 4 months.

## 2024-03-10 NOTE — Progress Notes (Addendum)
 Patient ID: Jasmine [name], female   DOB: 01/03/1966, 58 y.o.   MRN: [id number]   HPI: Jasmine [name] is a 58 y.o.-year-old female, presenting for f/u for DM2, dx in 2013/04/15, but had GDM in 04/16/1991, insulin -dependent, uncontrolled, with complications (CKD, DR).  She has been seen previously by Dr. Kassie. Last visit with me 5 months ago. PCP - in [place].  Interim history: No increased urination, no blurry vision, nausea, chest pain. She has constipation and sulfur eructations after switching to Mounjaro , however, the symptoms resolved since last visit even after increasing the dose. She had increased stress since last visit-her mother was sick and died at the beginning of last month.  Reviewed HbA1c levels: Lab Results  Component Value Date   HGBA1C 6.9 (A) 10/13/2023   HGBA1C 6.9 02/04/2023   HGBA1C 7.3 (A) 09/29/2022  06/09/2023: HbA1c 6.9% 05/22/2022: HbA1c calculated from fructosamine is 6.4%, better than the directly measured HbA1c.  She is on: - Toujeo  60 >> 54 >> 46 >> 64 >> 60-64 >> 46 >> 40-42 >> 36 >> 30 units daily - Ozempic  1  mg weekly - started April 15, 2021 >> 2 mg weekly >> Mounjaro  5 >> 7.5 mg weekly - Jardiance  10 mg >> Farxiga  5 mg >> Jardiance  10 mg daily in am Metformin >> tongue swelling. Farxiga  >> repeated yeast inf's (04/2017) and increased urination She was previously on NovoLog  but stopped in 07/2021 due to improved sugars. She was previously on Tresiba  and Lantus . She was previously on Trulicity  1.5 mg weekly but had GERD, undigested food; and Wegovy  but this was not covered anymore.  She checks her sugars more than 4 times a day with her  freestyle libre 3 CGM:  Previously:  Previously:  Lowest sugar was 60 >> 50s >> 53 >> 53 (traveling);  she has hypoglycemia awareness in the 80s. Highest sugar was  >600 >> ... 200s >> 212 (pancakes + syrup) >> 200  Glucometer: FreeStyle  Pt's meals are: - Breakfast: coffee or PB sandwich + coffee - Lunch: chicken,  veggies - Dinner: bowl of cereal or skips >> now 10 oz of meat and 2 cups of veggies - Snacks: many: M and M, granola bar, trailmix She was previously going to the weight management clinic, but not recently.  -+ CKD, last BUN/creatinine:  Lab Results  Component Value Date   BUN 14 09/29/2022   BUN 15 09/18/2021   CREATININE 1.21 (H) 09/29/2022   CREATININE 1.20 09/18/2021   Lab Results  Component Value Date   MICRALBCREAT 1 10/13/2023   MICRALBCREAT 4.5 06/14/2018   MICRALBCREAT <30 06/16/2016  Prev. On Olmesartan .  -+ HL; last set of lipids: Lab Results  Component Value Date   CHOL 218 (H) 09/29/2022   HDL 46.70 09/29/2022   LDLCALC 137 (H) 09/29/2022   LDLDIRECT 103.0 09/09/2017   TRIG 170.0 (H) 09/29/2022   CHOLHDL 5 09/29/2022  On Crestor  10 mg, changed from Zocor  in 09/2020.  - last eye exam was in April 16, 2023: + DR reportedly  - no numbness and tingling in her feet.  Last foot exam 06/09/2023.  Pt has FH of DM in everybody - both sides of family: M, F, S's, B's, aunts.  Her husband died of cancer in April 16, 2019.  She has a history of depression and goes to behavioral health.  She has a history of being noncompliant with diabetic medications.  ROS: + see HPI  I reviewed pt's medications, allergies, PMH, social hx, family hx, and changes  were documented in the history of present illness. Otherwise, unchanged from my initial visit note.  Past Medical History:  Diagnosis Date   Anxiety    Arthritis    knees (03/28/2014)   Chronic renal insufficiency    stage II (mild)   Depression    Headache    with high blood sugar   Hypercholesteremia    Hypertension    Numbness and tingling of right arm and leg    Seasonal allergies    Type II diabetes mellitus (HCC)    Umbilical hernia without mention of obstruction or gangrene    Wears glasses    Past Surgical History:  Procedure Laterality Date   INSERTION OF MESH N/A 03/28/2014   Procedure: INSERTION OF MESH;  Surgeon:  Lynda Leos, MD;  Location: [facility] OR;  Service: General;  Laterality: N/A;   INSERTION OF MESH N/A 05/08/2015   Procedure: INSERTION OF MESH;  Surgeon: Lynda Leos, MD;  Location: [facility] OR;  Service: General;  Laterality: N/A;   LAPAROSCOPIC LYSIS OF ADHESIONS N/A 05/08/2015   Procedure: LAPAROSCOPIC LYSIS OF ADHESIONS;  Surgeon: Lynda Leos, MD;  Location: [facility] OR;  Service: General;  Laterality: N/A;   MULTIPLE TOOTH EXTRACTIONS  ~ 2003   TONSILLECTOMY     UMBILICAL HERNIA REPAIR  1973   VENTRAL HERNIA REPAIR N/A 03/28/2014   Procedure: LAPAROSCOPIC VENTRAL HERNIA;  Surgeon: Lynda Leos, MD;  Location: [facility] OR;  Service: General;  Laterality: N/A;   VENTRAL HERNIA REPAIR N/A 05/08/2015   Procedure: LAPAROSCOPIC VENTRAL HERNIA REPAIR WITH MESH;  Surgeon: Lynda Leos, MD;  Location: [facility] OR;  Service: General;  Laterality: N/A;   Social History   Socioeconomic History   Marital status: Widowed    Spouse name: Garnette Lento   Number of children: 1   Years of education: Not on file   Highest education level: Not on file  Occupational History   Not on file  Tobacco Use   Smoking status: Former    Current packs/day: 0.25    Average packs/day: 0.3 packs/day for 26.0 years (6.5 ttl pk-yrs)    Types: Cigarettes   Smokeless tobacco: Never  Vaping Use   Vaping status: Never Used  Substance and Sexual Activity   Alcohol use: Yes    Comment: have a drink a few times/year   Drug use: No   Sexual activity: Not Currently  Other Topics Concern   Not on file  Social History Narrative   Not on file   Social Drivers of Health   Financial Resource Strain: Medium Risk (04/26/2023)   Overall Financial Resource Strain (CARDIA)    Difficulty of Paying Living Expenses: Somewhat hard  Food Insecurity: No Food Insecurity (04/26/2023)   Hunger Vital Sign    Worried About Running Out of Food in the Last Year: Never true    Ran Out of Food in the Last Year: Never true  Transportation  Needs: No Transportation Needs (04/26/2023)   PRAPARE - Administrator, Civil Service (Medical): No    Lack of Transportation (Non-Medical): No  Physical Activity: Insufficiently Active (04/26/2023)   Exercise Vital Sign    Days of Exercise per Week: 1 day    Minutes of Exercise per Session: 30 min  Stress: No Stress Concern Present (04/26/2023)   [employer] of Occupational Health - Occupational Stress Questionnaire    Feeling of Stress : Not at all  Social Connections: Moderately Integrated (04/26/2023)   Social Connection and Isolation Panel  Frequency of Communication with Friends and Family: More than three times a week    Frequency of Social Gatherings with Friends and Family: Once a week    Attends Religious Services: More than 4 times per year    Active Member of [employer] or Organizations: Yes    Attends Banker Meetings: More than 4 times per year    Marital Status: Widowed  Intimate Partner Violence: Unknown (11/14/2021)   Received from [facility]   HITS    Physically Hurt: Not on file    Insult or Talk Down To: Not on file    Threaten Physical Harm: Not on file    Scream or Curse: Not on file   Current Outpatient Medications on File Prior to Visit  Medication Sig Dispense Refill   albuterol  (VENTOLIN  HFA) 108 (90 Base) MCG/ACT inhaler INHALE 2 PUFFS BY MOUTH EVERY 6 HOURS AS NEEDED 9 g 0   amLODipine  (NORVASC ) 2.5 MG tablet Take 1 tablet (2.5 mg total) by mouth daily. 90 tablet 0   atorvastatin  (LIPITOR) 40 MG tablet Take 1 tablet (40 mg total) by mouth daily. 90 tablet 3   Continuous Glucose Sensor (FREESTYLE LIBRE 3 PLUS SENSOR) MISC CHANGE EVERY 15 DAYS 6 each 3   cyclobenzaprine  (FLEXERIL ) 10 MG tablet One half to one tab PO qHS, then increase gradually to one tab TID. 30 tablet 0   empagliflozin  (JARDIANCE ) 10 MG TABS tablet TAKE 1 TABLET BY MOUTH ONCE DAILY BEFORE BREAKFAST 30 tablet 4   insulin  glargine, 2 Unit Dial, (TOUJEO  MAX SOLOSTAR)  300 UNIT/ML Solostar Pen Inject 30 Units into the skin daily. 9 mL 3   Insulin  Pen Needle 32G X 4 MM MISC Use 4x a day 300 each 3   meloxicam  (MOBIC ) 7.5 MG tablet Take 1 tablet (7.5 mg total) by mouth daily. 90 tablet 3   tirzepatide  (MOUNJARO ) 10 MG/0.5ML Pen Inject 10 mg into the skin once a week. 6 mL 1   Vitamin D , Ergocalciferol , (DRISDOL ) 1.25 MG (50000 UNIT) CAPS capsule Take 1 capsule (50,000 Units total) by mouth every 7 (seven) days. 12 capsule 1   No current facility-administered medications on file prior to visit.   Allergies  Allergen Reactions   Egg-Derived Products Hives and Other (See Comments)    FLU VACCINE: caused hives     Influenza Vaccines Hives    Vaccine from Egg derived product causes Hives   Crestor  [Rosuvastatin ]     Made her arm feel like it was numb.   Effexor  [Venlafaxine ]     felt weird   Lantus  [Insulin  Glargine] Other (See Comments)    NOT EFFECTIVE >> sugars increase to >600!   Metformin And Related Swelling    ANGIOEDEMA   Family History  Problem Relation Age of Onset   Diabetes Mother    High Cholesterol Mother    Obesity Mother    Diabetes Father    Cancer Father        Prostate cancer   Cancer Sister        Cervical cancer   Diabetes Brother    Bipolar disorder Brother    Alcohol abuse Brother    Drug abuse Brother    Diabetes Sister    Diabetes Brother    PE: BP 122/70   Pulse 86   Ht 5' 2 (1.575 m)   Wt 223 lb 12.8 oz (101.5 kg)   SpO2 98%   BMI 40.93 kg/m  Wt Readings from Last 3  Encounters:  03/10/24 223 lb 12.8 oz (101.5 kg)  10/13/23 237 lb (107.5 kg)  06/09/23 244 lb 12.8 oz (111 kg)   Constitutional: overweight, in NAD Eyes:  EOMI, no exophthalmos ENT: no neck masses, no cervical lymphadenopathy Cardiovascular: RRR, No MRG Respiratory: CTA B Musculoskeletal: no deformities Skin:no rashes Neurological: no tremor with outstretched hands Diabetic Foot Exam - Simple   Simple Foot Form Diabetic Foot exam was  performed with the following findings: Yes 03/10/2024  4:06 PM  Visual Inspection No deformities, no ulcerations, no other skin breakdown bilaterally: Yes Sensation Testing Intact to touch and monofilament testing bilaterally: Yes Pulse Check Posterior Tibialis and Dorsalis pulse intact bilaterally: Yes Comments Pes planus B    ASSESSMENT: 1. DM2, insulin -dependent, uncontrolled, with complications - CKD stage 2 - DR  2. HL  3. Obesity class III  PLAN:  1. Patient with history of uncontrolled type 2 diabetes, on basal insulin , SGLT2 inhibitor and weekly GLP-1/GIP receptor agonist.  At last visit, HbA1c was stable, at 6.9%.  Sugars were excellent, fluctuating within the target range with only occasional hyperglycemic values after meals and occasional lows especially in the evening and at night.  We reduced her Toujeo  dose more.  She reported constipation with Mounjaro  and I recommended MiraLAX, senna, all Dulcolax to help with this.  We also discussed to maybe try Mylanta if she has more upper GI symptoms.  Her symptoms have resolved. CGM interpretation: -At today's visit, we reviewed her CGM downloads: It appears that 99% of values are in target range (goal >70%), while 1% are higher than 180 (goal <25%), and 0% are lower than 70 (goal <4%).  The calculated average blood sugar is 122.  The projected HbA1c for the next 3 months (GMI) is 6.2%. -Reviewing the CGM trends, sugars are excellent, almost entirely fluctuating within the target range, without lows, and with only 1 hyperglycemic exception in the last 2 weeks.  For now, we can continue to try to decrease the long-acting insulin  dose but we will continue the rest of the regimen.  She is wondering whether she should increase the dose of Mounjaro  but I would not recommend it for now since her sugars are excellent and she lost 14 pounds since last visit. - I suggested to:  Patient Instructions  Please continue: - Jardiance  10 mg daily -  Mounjaro  5 mg weekly  Try to reduce: - Toujeo  24 units daily  Please return in 4 months.  - we checked her HbA1c: 6.8% (higher than expected from the CGM) - advised to check sugars at different times of the day - 4x a day, rotating check times - advised for yearly eye exams >> she is UTD - will check annual labs today - return to clinic in 4 months  2. HL - Latest lipid panel was reviewed from last year: LDL above target, triglycerides also slightly high while LDL was at goal: Lab Results  Component Value Date   CHOL 218 (H) 09/29/2022   HDL 46.70 09/29/2022   LDLCALC 137 (H) 09/29/2022   LDLDIRECT 103.0 09/09/2017   TRIG 170.0 (H) 09/29/2022   CHOLHDL 5 09/29/2022  - She continues on Lipitor 40 mg daily: No side effects. - She is due for another lipid panel -will check this today  3. Obesity class III -We tried Farxiga  but she developed yeast infections; but now on Jardiance  without side effects. -Wegovy  was not approved by her insurance but she was able to start Ozempic  - We  were then able to switch from Ozempic  to Mounjaro , which should help further  Office Visit on 03/10/2024  Component Date Value Ref Range Status   Cholesterol 03/10/2024 259 (H)  <200 mg/dL Final   HDL 91/98/7974 51  > OR = 50 mg/dL Final   Triglycerides 91/98/7974 139  <150 mg/dL Final   LDL Cholesterol (Calc) 03/10/2024 180 (H)  mg/dL (calc) Final   Comment: Reference range: <100 . Desirable range <100 mg/dL for primary prevention;   <70 mg/dL for patients with CHD or diabetic patients  with > or = 2 CHD risk factors. SABRA LDL-C is now calculated using the Martin-Hopkins  calculation, which is a validated novel method providing  better accuracy than the Friedewald equation in the  estimation of LDL-C.  Gladis APPLETHWAITE et al. SANDREA. 7986;689(80): 2061-2068  (http://education.[facility].com/faq/FAQ164)    Total CHOL/HDL Ratio 03/10/2024 5.1 (H)  <5.0 (calc) Final   Non-HDL Cholesterol (Calc)  03/10/2024 208 (H)  <130 mg/dL (calc) Final   Comment: For patients with diabetes plus 1 major ASCVD risk  factor, treating to a non-HDL-C goal of <100 mg/dL  (LDL-C of <29 mg/dL) is considered a therapeutic  option.    Glucose, Bld 03/10/2024 119  65 - 139 mg/dL Final   Comment: .        Non-fasting reference interval .    BUN 03/10/2024 20  7 - 25 mg/dL Final   Creat 91/98/7974 1.21 (H)  0.50 - 1.03 mg/dL Final   eGFR 91/98/7974 53 (L)  > OR = 60 mL/min/1.53m2 Final   BUN/Creatinine Ratio 03/10/2024 17  6 - 22 (calc) Final   Sodium 03/10/2024 138  135 - 146 mmol/L Final   Potassium 03/10/2024 4.7  3.5 - 5.3 mmol/L Final   Chloride 03/10/2024 104  98 - 110 mmol/L Final   CO2 03/10/2024 25  20 - 32 mmol/L Final   Calcium  03/10/2024 10.0  8.6 - 10.4 mg/dL Final   Total Protein 91/98/7974 7.5  6.1 - 8.1 g/dL Final   Albumin 91/98/7974 4.5  3.6 - 5.1 g/dL Final   Globulin 91/98/7974 3.0  1.9 - 3.7 g/dL (calc) Final   AG Ratio 03/10/2024 1.5  1.0 - 2.5 (calc) Final   Total Bilirubin 03/10/2024 0.4  0.2 - 1.2 mg/dL Final   Alkaline phosphatase (APISO) 03/10/2024 65  37 - 153 U/L Final   AST 03/10/2024 14  10 - 35 U/L Final   ALT 03/10/2024 12  6 - 29 U/L Final   Hemoglobin A1C 03/10/2024 6.8 (A)  4.0 - 5.6 % Final   Kidney function is stable.  LDL is very high, at 180.  This is much higher than before.  If she is taking the Lipitor consistently, we need to increase the dose.  I will check with her.  Lela Fendt, MD PhD [facility] Endocrinology

## 2024-03-11 LAB — COMPREHENSIVE METABOLIC PANEL WITH GFR
AG Ratio: 1.5 (calc) (ref 1.0–2.5)
ALT: 12 U/L (ref 6–29)
AST: 14 U/L (ref 10–35)
Albumin: 4.5 g/dL (ref 3.6–5.1)
Alkaline phosphatase (APISO): 65 U/L (ref 37–153)
BUN/Creatinine Ratio: 17 (calc) (ref 6–22)
BUN: 20 mg/dL (ref 7–25)
CO2: 25 mmol/L (ref 20–32)
Calcium: 10 mg/dL (ref 8.6–10.4)
Chloride: 104 mmol/L (ref 98–110)
Creat: 1.21 mg/dL — ABNORMAL HIGH (ref 0.50–1.03)
Globulin: 3 g/dL (ref 1.9–3.7)
Glucose, Bld: 119 mg/dL (ref 65–139)
Potassium: 4.7 mmol/L (ref 3.5–5.3)
Sodium: 138 mmol/L (ref 135–146)
Total Bilirubin: 0.4 mg/dL (ref 0.2–1.2)
Total Protein: 7.5 g/dL (ref 6.1–8.1)
eGFR: 53 mL/min/1.73m2 — ABNORMAL LOW (ref >=60)

## 2024-03-11 LAB — LIPID PANEL W/REFLEX DIRECT LDL
Cholesterol: 259 mg/dL — ABNORMAL HIGH (ref <200)
HDL: 51 mg/dL (ref >=50)
LDL Cholesterol (Calc): 180 mg/dL — ABNORMAL HIGH
Non-HDL Cholesterol (Calc): 208 mg/dL — ABNORMAL HIGH (ref <130)
Total CHOL/HDL Ratio: 5.1 (calc) — ABNORMAL HIGH (ref <5.0)
Triglycerides: 139 mg/dL (ref <150)

## 2024-03-13 ENCOUNTER — Ambulatory Visit: Payer: Self-pay | Admitting: Internal Medicine

## 2024-03-14 MED ORDER — ATORVASTATIN CALCIUM 40 MG PO TABS: 40.0000 mg | ORAL_TABLET | Freq: Every day | ORAL | 3 refills | Status: AC

## 2024-03-14 NOTE — Addendum Note (Signed)
 Addended by: TRIXIE FILE on: 03/14/2024 11:42 AM   Modules accepted: Orders

## 2024-04-03 ENCOUNTER — Ambulatory Visit: Admitting: Internal Medicine

## 2024-04-11 ENCOUNTER — Encounter: Payer: Self-pay | Admitting: Sports Medicine

## 2024-05-28 ENCOUNTER — Other Ambulatory Visit: Payer: Self-pay | Admitting: Physician Assistant

## 2024-05-28 DIAGNOSIS — I10 Essential (primary) hypertension: Secondary | ICD-10-CM

## 2024-05-28 DIAGNOSIS — E559 Vitamin D deficiency, unspecified: Secondary | ICD-10-CM

## 2024-05-30 ENCOUNTER — Telehealth: Payer: Self-pay

## 2024-05-30 NOTE — Telephone Encounter (Signed)
 I tried to call patient. The voicemail didn't leave a name and I am not sure this was the patient's voicemail.   Called for colon cancer screening.   She is due for a Cologuard if not done in the last 3 years or had a colonoscopy in the last 10 years.   MyChart message sent.

## 2024-06-05 ENCOUNTER — Other Ambulatory Visit: Payer: Self-pay | Admitting: Medical Genetics

## 2024-06-05 DIAGNOSIS — Z006 Encounter for examination for normal comparison and control in clinical research program: Secondary | ICD-10-CM

## 2024-06-12 ENCOUNTER — Other Ambulatory Visit: Payer: Self-pay | Admitting: Physician Assistant

## 2024-06-12 DIAGNOSIS — E559 Vitamin D deficiency, unspecified: Secondary | ICD-10-CM

## 2024-06-27 ENCOUNTER — Encounter: Payer: Self-pay | Admitting: Physician Assistant

## 2024-07-08 ENCOUNTER — Other Ambulatory Visit: Payer: Self-pay | Admitting: Internal Medicine

## 2024-07-10 ENCOUNTER — Encounter: Payer: Self-pay | Admitting: Internal Medicine

## 2024-07-10 ENCOUNTER — Ambulatory Visit (INDEPENDENT_AMBULATORY_CARE_PROVIDER_SITE_OTHER): Admitting: Internal Medicine

## 2024-07-10 VITALS — BP 138/70 | HR 83 | Ht 62.0 in | Wt 221.2 lb

## 2024-07-10 DIAGNOSIS — Z794 Long term (current) use of insulin: Secondary | ICD-10-CM

## 2024-07-10 DIAGNOSIS — E1169 Type 2 diabetes mellitus with other specified complication: Secondary | ICD-10-CM

## 2024-07-10 DIAGNOSIS — E1122 Type 2 diabetes mellitus with diabetic chronic kidney disease: Secondary | ICD-10-CM

## 2024-07-10 DIAGNOSIS — E66813 Obesity, class 3: Secondary | ICD-10-CM

## 2024-07-10 DIAGNOSIS — I10 Essential (primary) hypertension: Secondary | ICD-10-CM

## 2024-07-10 DIAGNOSIS — E785 Hyperlipidemia, unspecified: Secondary | ICD-10-CM

## 2024-07-10 LAB — POCT GLYCOSYLATED HEMOGLOBIN (HGB A1C): Hemoglobin A1C: 6.9 % — AB (ref 4.0–5.6)

## 2024-07-10 MED ORDER — AMLODIPINE BESYLATE 2.5 MG PO TABS: 2.5000 mg | ORAL_TABLET | Freq: Every day | ORAL | 0 refills | Status: AC

## 2024-07-10 MED ORDER — TIRZEPATIDE 12.5 MG/0.5ML ~~LOC~~ SOAJ: 12.5000 mg | SUBCUTANEOUS | 3 refills | Status: AC

## 2024-07-10 MED ORDER — EMPAGLIFLOZIN 10 MG PO TABS: 10.0000 mg | ORAL_TABLET | Freq: Every day | ORAL | 3 refills | Status: AC

## 2024-07-10 NOTE — Patient Instructions (Addendum)
 Please continue: - Jardiance  10 mg daily - Toujeo  24 units daily  You can increase: - Mounjaro  12.5 mg weekly  Please return in 4 months.

## 2024-07-10 NOTE — Progress Notes (Signed)
 Patient ID: Jasmine [name], female   DOB: [birth year], 58 y.o.   MRN: [id number]   HPI: Jasmine [name] is a 58 y.o.-year-old female, presenting for f/u for DM2, dx in 08/13/13, but had GDM in Aug 14, 1991, insulin -dependent, uncontrolled, with complications (CKD, DR).  She has been seen previously by Dr. Kassie. Last visit with me 4 months ago. PCP - in [place].  Interim history: No increased urination, no blurry vision, nausea, chest pain. She initially had constipation and sulfur eructations after switching to Mounjaro , however, the symptoms resolved before last visit even after increasing the dose. She had increased stress last visit-her mother was sick and died in the month prior to our last visit. Today's visit, she mentions that she saw a plateau in her weight loss and she is interested in increasing Mounjaro  further.  Reviewed HbA1c levels: Lab Results  Component Value Date   HGBA1C 6.8 (A) 03/10/2024   HGBA1C 6.9 (A) 10/13/2023   HGBA1C 6.9 02/04/2023  06/09/2023: HbA1c 6.9% 05/22/2022: HbA1c calculated from fructosamine is 6.4%, better than the directly measured HbA1c.  She is on: - Toujeo  60 >> 54 >> 46 >> 64 >> 60-64 >> 46 >> 40-42 >> 36 >> 30 >> 24 units daily - Ozempic  1  mg weekly - started 03/2021 >> 2 mg weekly >> Mounjaro  5 >> 7.5 >> 10 mg weekly - Jardiance  10 mg >> Farxiga  5 mg >> Jardiance  10 mg daily in am Metformin >> tongue swelling. Farxiga  >> repeated yeast inf's (04/2017) and increased urination She was previously on NovoLog  but stopped in 08/13/21 due to improved sugars. She was previously on Tresiba  and Lantus . She was previously on Trulicity  1.5 mg weekly but had GERD, undigested food; and Wegovy  but this was not covered anymore.  She checks her sugars more than 4 times a day with her  freestyle libre 3 CGM:  Prev.:  Previously:  Lowest sugar was  53 >> 53 (traveling);  she has hypoglycemia awareness in the 80s. Highest sugar was  >600 >> ... 200s >> 212  (pancakes + syrup) >> 200s.  Glucometer: FreeStyle  Pt's meals are: - Breakfast: coffee or PB sandwich + coffee - Lunch: chicken, veggies - Dinner: bowl of cereal or skips >> now 10 oz of meat and 2 cups of veggies - Snacks: many: M and M, granola bar, trailmix She was previously going to the weight management clinic, but not recently.  -+ CKD, last BUN/creatinine:  Lab Results  Component Value Date   BUN 20 03/10/2024   BUN 14 09/29/2022   CREATININE 1.21 (H) 03/10/2024   CREATININE 1.21 (H) 09/29/2022   Lab Results  Component Value Date   MICRALBCREAT 1 10/13/2023   MICRALBCREAT 4.5 06/14/2018   MICRALBCREAT <30 06/16/2016  Prev. On Olmesartan .  -+ HL; last set of lipids: Lab Results  Component Value Date   CHOL 259 (H) 03/10/2024   HDL 51 03/10/2024   LDLCALC 180 (H) 03/10/2024   LDLDIRECT 103.0 09/09/2017   TRIG 139 03/10/2024   CHOLHDL 5.1 (H) 03/10/2024  Previously on Crestor  10 mg, Zocor .  We restarted Lipitor 40 mg daily after the above results returned.  - last eye exam was in 14-Aug-2023: + DR reportedly  - no numbness and tingling in her feet.  Last foot exam 03/10/2024.  Pt has FH of DM in everybody - both sides of family: M, F, S's, B's, aunts.  Her husband died of cancer in 08-14-2019.  She has a history of depression  and goes to behavioral health.  She has a history of being noncompliant with diabetic medications.  ROS: + see HPI  I reviewed pt's medications, allergies, PMH, social hx, family hx, and changes were documented in the history of present illness. Otherwise, unchanged from my initial visit note.  Past Medical History:  Diagnosis Date   Anxiety    Arthritis    knees (03/28/2014)   Chronic renal insufficiency    stage II (mild)   Depression    Headache    with high blood sugar   Hypercholesteremia    Hypertension    Numbness and tingling of right arm and leg    Seasonal allergies    Type II diabetes mellitus (HCC)    Umbilical hernia  without mention of obstruction or gangrene    Wears glasses    Past Surgical History:  Procedure Laterality Date   INSERTION OF MESH N/A 03/28/2014   Procedure: INSERTION OF MESH;  Surgeon: Lynda Leos, MD;  Location: [facility] OR;  Service: General;  Laterality: N/A;   INSERTION OF MESH N/A 05/08/2015   Procedure: INSERTION OF MESH;  Surgeon: Lynda Leos, MD;  Location: [facility];  Service: General;  Laterality: N/A;   LAPAROSCOPIC LYSIS OF ADHESIONS N/A 05/08/2015   Procedure: LAPAROSCOPIC LYSIS OF ADHESIONS;  Surgeon: Lynda Leos, MD;  Location: [facility] OR;  Service: General;  Laterality: N/A;   MULTIPLE TOOTH EXTRACTIONS  ~ 2003   TONSILLECTOMY     UMBILICAL HERNIA REPAIR  1973   VENTRAL HERNIA REPAIR N/A 03/28/2014   Procedure: LAPAROSCOPIC VENTRAL HERNIA;  Surgeon: Lynda Leos, MD;  Location: [facility];  Service: General;  Laterality: N/A;   VENTRAL HERNIA REPAIR N/A 05/08/2015   Procedure: LAPAROSCOPIC VENTRAL HERNIA REPAIR WITH MESH;  Surgeon: Lynda Leos, MD;  Location: [facility];  Service: General;  Laterality: N/A;   Social History   Socioeconomic History   Marital status: Widowed    Spouse name: Garnette Lento   Number of children: 1   Years of education: Not on file   Highest education level: Not on file  Occupational History   Not on file  Tobacco Use   Smoking status: Former    Current packs/day: 0.25    Average packs/day: 0.3 packs/day for 26.0 years (6.5 ttl pk-yrs)    Types: Cigarettes   Smokeless tobacco: Never  Vaping Use   Vaping status: Never Used  Substance and Sexual Activity   Alcohol use: Yes    Comment: have a drink a few times/year   Drug use: No   Sexual activity: Not Currently  Other Topics Concern   Not on file  Social History Narrative   Not on file   Social Drivers of Health   Financial Resource Strain: Medium Risk (04/26/2023)   Overall Financial Resource Strain (CARDIA)    Difficulty of Paying Living Expenses: Somewhat hard  Food  Insecurity: No Food Insecurity (04/26/2023)   Hunger Vital Sign    Worried About Running Out of Food in the Last Year: Never true    Ran Out of Food in the Last Year: Never true  Transportation Needs: No Transportation Needs (04/26/2023)   PRAPARE - Administrator, Civil Service (Medical): No    Lack of Transportation (Non-Medical): No  Physical Activity: Insufficiently Active (04/26/2023)   Exercise Vital Sign    Days of Exercise per Week: 1 day    Minutes of Exercise per Session: 30 min  Stress: No Stress Concern Present (04/26/2023)  [employer] of Occupational Health - Occupational Stress Questionnaire    Feeling of Stress : Not at all  Social Connections: Moderately Integrated (04/26/2023)   Social Connection and Isolation Panel    Frequency of Communication with Friends and Family: More than three times a week    Frequency of Social Gatherings with Friends and Family: Once a week    Attends Religious Services: More than 4 times per year    Active Member of [employer] or Organizations: Yes    Attends Banker Meetings: More than 4 times per year    Marital Status: Widowed  Intimate Partner Violence: Unknown (11/14/2021)   Received from [facility]   HITS    Physically Hurt: Not on file    Insult or Talk Down To: Not on file    Threaten Physical Harm: Not on file    Scream or Curse: Not on file   Current Outpatient Medications on File Prior to Visit  Medication Sig Dispense Refill   albuterol  (VENTOLIN  HFA) 108 (90 Base) MCG/ACT inhaler INHALE 2 PUFFS BY MOUTH EVERY 6 HOURS AS NEEDED 9 g 0   amLODipine  (NORVASC ) 2.5 MG tablet Take 1 tablet (2.5 mg total) by mouth daily. 90 tablet 0   atorvastatin  (LIPITOR) 40 MG tablet Take 1 tablet (40 mg total) by mouth daily. 90 tablet 3   Continuous Glucose Sensor (FREESTYLE LIBRE 3 PLUS SENSOR) MISC CHANGE EVERY 15 DAYS 6 each 3   cyclobenzaprine  (FLEXERIL ) 10 MG tablet One half to one tab PO qHS, then increase  gradually to one tab TID. 30 tablet 0   insulin  glargine, 2 Unit Dial, (TOUJEO  MAX SOLOSTAR) 300 UNIT/ML Solostar Pen Inject 24 Units into the skin daily.     Insulin  Pen Needle 32G X 4 MM MISC Use 4x a day 300 each 3   JARDIANCE  10 MG TABS tablet TAKE 1 TABLET BY MOUTH ONCE DAILY BEFORE BREAKFAST 30 tablet 0   meloxicam  (MOBIC ) 7.5 MG tablet Take 1 tablet (7.5 mg total) by mouth daily. 90 tablet 3   tirzepatide  (MOUNJARO ) 7.5 MG/0.5ML Pen Inject 7.5 mg into the skin once a week. 6 mL 3   Vitamin D , Ergocalciferol , (DRISDOL ) 1.25 MG (50000 UNIT) CAPS capsule Take 1 capsule (50,000 Units total) by mouth every 7 (seven) days. 12 capsule 1   No current facility-administered medications on file prior to visit.   Allergies  Allergen Reactions   Egg Protein-Containing Drug Products Hives and Other (See Comments)    FLU VACCINE: caused hives     Influenza Vaccines Hives    Vaccine from Egg derived product causes Hives   Crestor  [Rosuvastatin ]     Made her arm feel like it was numb.   Effexor  [Venlafaxine ]     felt weird   Lantus  [Insulin  Glargine] Other (See Comments)    NOT EFFECTIVE >> sugars increase to >600!   Metformin And Related Swelling    ANGIOEDEMA   Family History  Problem Relation Age of Onset   Diabetes Mother    High Cholesterol Mother    Obesity Mother    Diabetes Father    Cancer Father        Prostate cancer   Cancer Sister        Cervical cancer   Diabetes Brother    Bipolar disorder Brother    Alcohol abuse Brother    Drug abuse Brother    Diabetes Sister    Diabetes Brother    PE: BP 138/70  Pulse 83   Ht 5' 2 (1.575 m)   Wt 221 lb 3.2 oz (100.3 kg)   SpO2 99%   BMI 40.46 kg/m  Wt Readings from Last 3 Encounters:  07/10/24 221 lb 3.2 oz (100.3 kg)  03/10/24 223 lb 12.8 oz (101.5 kg)  10/13/23 237 lb (107.5 kg)   Constitutional: overweight, in NAD Eyes:  EOMI, no exophthalmos ENT: no neck masses, no cervical lymphadenopathy Cardiovascular:  RRR, No MRG Respiratory: CTA B Musculoskeletal: no deformities Skin:no rashes Neurological: no tremor with outstretched hands  ASSESSMENT: 1. DM2, insulin -dependent, uncontrolled, with complications - CKD stage 2 - DR  2. HL  3. Obesity class III  PLAN:  1. Patient with history of uncontrolled type 2 diabetes, on basal insulin , SGLT2 inhibitor and also weekly GLP-1/GIP receptor agonist, with improvement in blood sugars at last visit, and an HbA1c of 6.8%, slightly lower than previously.  However, sugars at home appear to be better than expected from the HbA1c, almost entirely fluctuating within the target range, without lows and only with 1 hypoglycemic exception in the previous 2 weeks.  We discussed about continuing to decrease her basal insulin .  She was wondering whether she should increase the dose of Mounjaro  or not but I did not recommend to do so due to the excellent blood sugars and the weight loss of 14 pounds since the previous visit. - She previously had constipation with Mounjaro  and I recommended MiraLAX, senna, all Dulcolax to help with this.  We also discussed to maybe try Mylanta if she has more upper GI symptoms.  However, her symptoms resolved. CGM interpretation: -At today's visit, we reviewed her CGM downloads: It appears that 96% of values are in target range (goal >70%), while 4% are higher than 180 (goal <25%), and 0% are lower than 70 (goal <4%).  The calculated average blood sugar is 120.  The projected HbA1c for the next 3 months (GMI) is 6.2%. -Reviewing the CGM trends, the vast majority of the blood sugars are fluctuating within the target range despite reduction of her Toujeo  dose, but in the setting of increased Mounjaro  dose.  She does have some hyperglycemic spikes possibly due to the holidays.  She is interested in increasing the dose of Mounjaro  and we can do that, and I did advise her to try to reduce the dose of insulin  further after the holidays.  We can  continue the same dose of Jardiance  for now.  This was refilled for her today. - I suggested to:  Patient Instructions  Please continue: - Jardiance  10 mg daily - Toujeo  24 units daily  You can increase: - Mounjaro  12.5 mg weekly  Please return in 4 months.  - we checked her HbA1c: 6.9% (slightly higher than last visit and higher than expected from her CGM) - advised to check sugars at different times of the day - 4x a day, rotating check times - advised for yearly eye exams >> she is UTD - return to clinic in 4 months  2. HL - Latest lipid panel showed very high LDL, otherwise fractions at goal: Lab Results  Component Value Date   CHOL 259 (H) 03/10/2024   HDL 51 03/10/2024   LDLCALC 180 (H) 03/10/2024   LDLDIRECT 103.0 09/09/2017   TRIG 139 03/10/2024   CHOLHDL 5.1 (H) 03/10/2024  - On Lipitor 40 mg daily, restarted at last visit.  When the above results returned, the patient mentioned that she was off the statin for at least  3 months.  I sent a new prescription to her pharmacy.  3. Obesity class III -We tried Farxiga  but she developed yeast infections; but now on Jardiance  without side effects -Wegovy  was not approved by her insurance but she was able to start Ozempic  and then switched to Mounjaro , which is the strongest medication in the class. - Since her weight plateaued since last visit, we will go ahead and increase the dose of Mounjaro  further.  Lela Fendt, MD PhD [facility] Endocrinology

## 2024-08-18 ENCOUNTER — Encounter: Admitting: Physician Assistant

## 2024-08-21 ENCOUNTER — Ambulatory Visit (INDEPENDENT_AMBULATORY_CARE_PROVIDER_SITE_OTHER): Admitting: Physician Assistant

## 2024-08-21 ENCOUNTER — Other Ambulatory Visit (HOSPITAL_COMMUNITY)
Admission: RE | Admit: 2024-08-21 | Discharge: 2024-08-21 | Disposition: A | Discharge status: Home / Self Care | Source: Ambulatory Visit | Attending: Physician Assistant | Admitting: Physician Assistant

## 2024-08-21 VITALS — BP 138/88 | HR 100 | Wt 222.0 lb

## 2024-08-21 DIAGNOSIS — E782 Mixed hyperlipidemia: Secondary | ICD-10-CM

## 2024-08-21 DIAGNOSIS — Z1211 Encounter for screening for malignant neoplasm of colon: Secondary | ICD-10-CM | POA: Diagnosis not present

## 2024-08-21 DIAGNOSIS — Z7984 Long term (current) use of oral hypoglycemic drugs: Secondary | ICD-10-CM

## 2024-08-21 DIAGNOSIS — E785 Hyperlipidemia, unspecified: Secondary | ICD-10-CM | POA: Diagnosis not present

## 2024-08-21 DIAGNOSIS — E66813 Obesity, class 3: Secondary | ICD-10-CM | POA: Diagnosis not present

## 2024-08-21 DIAGNOSIS — Z7985 Long-term (current) use of injectable non-insulin antidiabetic drugs: Secondary | ICD-10-CM

## 2024-08-21 DIAGNOSIS — E538 Deficiency of other specified B group vitamins: Secondary | ICD-10-CM

## 2024-08-21 DIAGNOSIS — E1169 Type 2 diabetes mellitus with other specified complication: Secondary | ICD-10-CM | POA: Diagnosis not present

## 2024-08-21 DIAGNOSIS — Z0000 Encounter for general adult medical examination without abnormal findings: Secondary | ICD-10-CM | POA: Insufficient documentation

## 2024-08-21 DIAGNOSIS — E559 Vitamin D deficiency, unspecified: Secondary | ICD-10-CM | POA: Diagnosis not present

## 2024-08-21 DIAGNOSIS — Z1159 Encounter for screening for other viral diseases: Secondary | ICD-10-CM | POA: Diagnosis not present

## 2024-08-21 DIAGNOSIS — N2889 Other specified disorders of kidney and ureter: Secondary | ICD-10-CM

## 2024-08-21 NOTE — Patient Instructions (Signed)

## 2024-08-23 ENCOUNTER — Encounter: Payer: Self-pay | Admitting: Physician Assistant

## 2024-08-23 ENCOUNTER — Ambulatory Visit: Payer: Self-pay | Admitting: Physician Assistant

## 2024-08-23 ENCOUNTER — Other Ambulatory Visit: Payer: Self-pay | Admitting: Internal Medicine

## 2024-08-23 DIAGNOSIS — E559 Vitamin D deficiency, unspecified: Secondary | ICD-10-CM

## 2024-08-23 LAB — CYTOLOGY - PAP
Adequacy: ABSENT
Comment: NEGATIVE
Diagnosis: NEGATIVE
High risk HPV: NEGATIVE

## 2024-08-23 NOTE — Progress Notes (Signed)
 Jasmine [name],   No abnormal cells. Negative for HPV.

## 2024-08-23 NOTE — Progress Notes (Signed)
 "  Complete physical exam  Patient: Jasmine [name]   DOB: 1965/12/22   58 y.o. Female  MRN: [id number]  Subjective:    Chief Complaint  Patient presents with   Annual Exam   .Discussed the use of AI scribe software for clinical note transcription with the patient, who gave verbal consent to proceed.  History of Present Illness Jasmine [name] is a 59 year old female who presents for a routine follow-up visit.  Glycemic control - Hemoglobin A1c was 6.9% last month - Takes medications for diabetes, managed by endocrinology - No chest pain, shortness of breath, or abnormal bleeding  Weight management - Current weight is 222 pounds, increased from 221 pounds on December 1st - Desires continued weight loss - Engages in walking and elliptical exercise, but activity level is limited  Gastrointestinal and genitourinary symptoms - Regular bowel movements - No pain with sexual activity  Preventive health maintenance - Recent eye exam at [facility] - Pap smear last performed six years ago, currently due - Mammogram last performed two years ago, currently due - Never had a colonoscopy  Immunization status - Has not received influenza vaccine this year due to allergy     Most recent fall risk assessment:    08/21/2024    3:18 PM  Fall Risk   Falls in the past year? 0  Number falls in past yr: 0  Injury with Fall? 0  Risk for fall due to : No Fall Risks     Most recent depression screenings:    08/21/2024    3:18 PM 04/26/2023    1:47 PM  PHQ 2/9 Scores  PHQ - 2 Score 4 0  PHQ- 9 Score 7 2      Data saved with a previous flowsheet row definition    Vision:Within last year and Dental: No current dental problems and Receives regular dental care    Patient Care Team: Antoniette Vermell LITTIE DEVONNA as PCP - General (Family Medicine) Oman, Heather, OD as Referring Physician (Optometry) Mammography, [facility] (Diagnostic Radiology)   Show/hide medication  list[1]  ROS See HPI.     Objective:     BP 138/88   Pulse 100   Wt 222 lb (100.7 kg)   SpO2 97%   BMI 40.60 kg/m  BP Readings from Last 3 Encounters:  08/21/24 138/88  07/10/24 138/70  03/10/24 122/70   Wt Readings from Last 3 Encounters:  08/21/24 222 lb (100.7 kg)  07/10/24 221 lb 3.2 oz (100.3 kg)  03/10/24 223 lb 12.8 oz (101.5 kg)      Physical Exam  BP 138/88   Pulse 100   Wt 222 lb (100.7 kg)   SpO2 97%   BMI 40.60 kg/m   General Appearance:    Alert, cooperative, obese, no distress, appears stated age  Head:    Normocephalic, without obvious abnormality, atraumatic  Eyes:    PERRL, conjunctiva/corneas clear, EOM's intact, fundi    benign, both eyes  Ears:    Normal TM's and external ear canals, both ears  Nose:   Nares normal, septum midline, mucosa normal, no drainage    or sinus tenderness  Throat:   Lips, mucosa, and tongue normal; teeth and gums normal  Neck:   Supple, symmetrical, trachea midline, no adenopathy;    thyroid:  no enlargement/tenderness/nodules; no carotid   bruit or JVD  Back:     Symmetric, no curvature, ROM normal, no CVA tenderness  Lungs:  Clear to auscultation bilaterally, respirations unlabored  Chest Wall:    No tenderness or deformity   Heart:    Regular rate and rhythm, S1 and S2 normal, no murmur, rub   or gallop     Abdomen:     Soft, non-tender, bowel sounds active all four quadrants,    no masses, no organomegaly  Genitalia:    Normal female without lesion, discharge or tenderness  Rectal:    Normal tone  Extremities:   Extremities normal, atraumatic, no cyanosis or edema  Pulses:   2+ and symmetric all extremities  Skin:   Skin color, texture, turgor normal, no rashes or lesions  Lymph nodes:   Cervical, supraclavicular, and axillary nodes normal  Neurologic:   CNII-XII intact, normal strength, sensation and reflexes    throughout      Assessment & Plan:    Routine Health Maintenance and Physical  Exam  Immunization History  Administered Date(s) Administered   PNEUMOCOCCAL CONJUGATE-20 01/07/2023   Pneumococcal Polysaccharide-23 03/29/2014   Tdap 10/20/2016, 04/24/2023   Zoster Recombinant(Shingrix) 01/07/2023, 04/24/2023    Health Maintenance  Topic Date Due   Hepatitis C Screening  Never done   OPHTHALMOLOGY EXAM  06/24/2021   Mammogram  08/23/2023   COVID-19 Vaccine (1 - 2025-26 season) 09/06/2024 (Originally 04/10/2024)   Influenza Vaccine  11/07/2024 (Originally 03/10/2024)   Hepatitis B Vaccines 19-59 Average Risk (1 of 3 - 19+ 3-dose series) 08/21/2025 (Originally 08/30/1986)   Colonoscopy  08/21/2025 (Originally 08/30/2012)   HIV Screening  08/21/2025 (Originally 08/30/1982)   Diabetic kidney evaluation - Urine ACR  10/12/2024   HEMOGLOBIN A1C  01/08/2025   Diabetic kidney evaluation - eGFR measurement  03/10/2025   FOOT EXAM  03/10/2025   Cervical Cancer Screening (HPV/Pap Cotest)  08/22/2027   DTaP/Tdap/Td (3 - Td or Tdap) 04/23/2033   Pneumococcal Vaccine: 50+ Years  Completed   Zoster Vaccines- Shingrix  Completed   HPV VACCINES  Aged Out   Meningococcal B Vaccine  Aged Out    Discussed health benefits of physical activity, and encouraged her to engage in regular exercise appropriate for her age and condition.  [name] was seen today for annual exam.  Diagnoses and all orders for this visit:  Encounter for annual physical exam -     CBC with Differential/Platelet -     CMP14+EGFR -     Lipid panel -     TSH -     VITAMIN D  25 Hydroxy (Vit-D Deficiency, Fractures) -     Vitamin B12 -     Hepatitis C Antibody -     Cytology - PAP  Mixed hyperlipidemia -     Lipid panel  Chronic renal insufficiency, stage II (mild) -     CMP14+EGFR  Hyperlipidemia associated with type 2 diabetes mellitus (HCC) -     Lipid panel  Vitamin D  deficiency -     CBC with Differential/Platelet -     VITAMIN D  25 Hydroxy (Vit-D Deficiency, Fractures)  B12 deficiency -      CBC with Differential/Platelet -     Vitamin B12  Encounter for hepatitis C screening test for low risk patient -     Hepatitis C Antibody  Colon cancer screening -     Cologuard  Obesity, Class III, BMI 40-49.9 (morbid obesity) (HCC)   Assessment & Plan Woman's Wellness Visit Routine wellness visit with no abnormalities noted. Cervix healthy. Last eye exam recent. Mammogram reminder received. Colonoscopy screening pending decision. -  Performed Pap smear. - Will obtain copy of recent eye exam. - Will discuss colonoscopy or Cologuard for colon cancer screening. Sent cologuard.  - Ensure mammogram is up to date.  Type 2 diabetes mellitus with hyperlipidemia Type 2 diabetes well-controlled with A1c of 6.9. Hyperlipidemia managed with atorvastatin . - Continue current diabetes management regimen. - Continue atorvastatin  for hyperlipidemia. - Managed by endocrinology.   Obesity, class 3 Weight stable at 221-222 lbs. Engaging in physical activity. - Encouraged continued physical activity and weight management.  Hypertension - Close to goal, continue same regimen for now.  - Continue to limit salt and work on weight loss.     Return in about 1 year (around 08/21/2025).     Hussain Maimone, PA-C      [1]  Outpatient Medications Prior to Visit  Medication Sig   albuterol  (VENTOLIN  HFA) 108 (90 Base) MCG/ACT inhaler INHALE 2 PUFFS BY MOUTH EVERY 6 HOURS AS NEEDED   amLODipine  (NORVASC ) 2.5 MG tablet Take 1 tablet (2.5 mg total) by mouth daily.   atorvastatin  (LIPITOR) 40 MG tablet Take 1 tablet (40 mg total) by mouth daily.   cyclobenzaprine  (FLEXERIL ) 10 MG tablet One half to one tab PO qHS, then increase gradually to one tab TID.   empagliflozin  (JARDIANCE ) 10 MG TABS tablet Take 1 tablet (10 mg total) by mouth daily before breakfast.   insulin  glargine, 2 Unit Dial, (TOUJEO  MAX SOLOSTAR) 300 UNIT/ML Solostar Pen Inject 24 Units into the skin daily.   Insulin  Pen Needle  32G X 4 MM MISC Use 4x a day   meloxicam  (MOBIC ) 7.5 MG tablet Take 1 tablet (7.5 mg total) by mouth daily.   tirzepatide  (MOUNJARO ) 12.5 MG/0.5ML Pen Inject 12.5 mg into the skin once a week.   Vitamin D , Ergocalciferol , (DRISDOL ) 1.25 MG (50000 UNIT) CAPS capsule Take 1 capsule (50,000 Units total) by mouth every 7 (seven) days.   [DISCONTINUED] Continuous Glucose Sensor (FREESTYLE LIBRE 3 PLUS SENSOR) MISC CHANGE EVERY 15 DAYS   No facility-administered medications prior to visit.   "

## 2024-08-28 ENCOUNTER — Ambulatory Visit: Admitting: Podiatry

## 2024-08-28 DIAGNOSIS — N1831 Chronic kidney disease, stage 3a: Secondary | ICD-10-CM

## 2024-08-28 DIAGNOSIS — E1122 Type 2 diabetes mellitus with diabetic chronic kidney disease: Secondary | ICD-10-CM | POA: Diagnosis not present

## 2024-08-28 DIAGNOSIS — L600 Ingrowing nail: Secondary | ICD-10-CM

## 2024-08-28 DIAGNOSIS — Z794 Long term (current) use of insulin: Secondary | ICD-10-CM | POA: Diagnosis not present

## 2024-08-28 MED ORDER — MUPIROCIN 2 % EX OINT: 1.0000 | TOPICAL_OINTMENT | Freq: Two times a day (BID) | CUTANEOUS | 2 refills | Status: AC

## 2024-08-28 NOTE — Patient Instructions (Signed)
 Place 1/4 cup of epsom salts in a quart of warm tap water.  Submerge your foot or feet in the solution and soak for 20 minutes.  This soak should be done twice a day.  Next, remove your foot or feet from solution, blot dry the affected area. Apply ointment and cover if instructed by your doctor.   IF YOUR SKIN BECOMES IRRITATED WHILE USING THESE INSTRUCTIONS, IT IS OKAY TO SWITCH TO  WHITE VINEGAR AND WATER.  As another alternative soak, you may use antibacterial soap and water.  Monitor for any signs/symptoms of infection. Call the office immediately if any occur or go directly to the emergency room. Call with any questions/concerns.  Use a small amount of mupirocin  ointment to the nail avulsion site for the first 7 days.  After that time discontinue the use ointment as this can keep the area too moist.  Continue to soak and bandage the toe until a dry scab forms.

## 2024-08-28 NOTE — Progress Notes (Unsigned)
 "      Subjective:  Patient ID: Jasmine [name], female    DOB: 11-04-1965,  MRN: [id number]  Jasmine [name] presents to clinic today for:  Chief Complaint  Patient presents with   Ingrown Toenail    Pt is here due to possible ingrown to left great toenail, states it has been bothering for [name] for about a month.  Patient comes in for above-stated complaint.  Concern for ingrown toenail left first toe medial border.  She is diabetic, A1c from last month 6.9.  Reports pain to the affected toenails been going on for about the last month.  Denies signs of infection.  PCP is Breeback, Jade L, PA-C.  Allergies[1]  Review of Systems: Negative except as noted in the HPI.  Objective:  There were no vitals filed for this visit.  Jasmine [name] is a pleasant 59 y.o. female in NAD. AAO x 3.  Vascular Examination: Capillary refill time is less than 3 seconds to toes bilateral. Palpable pedal pulses b/l LE. Skin temperature gradient WNL b/l. No varicosities b/l. No cyanosis or clubbing noted b/l.   Dermatological Examination: There is incurvation of the left medial nail border.  There is pain on palpation of the affected nail border.  No signs of acute bacterial infection  Neurological Examination: Protective sensation intact with Semmes-Weinstein 10 gram monofilament b/l LE. Vibratory sensation intact b/l LE.     Latest Ref Rng & Units 07/10/2024    3:16 PM 03/10/2024    4:12 PM 10/13/2023   11:19 AM  Hemoglobin A1C  Hemoglobin-A1c 4.0 - 5.6 % 6.9  6.8  6.9      Assessment/Plan: 1. Ingrown toenail of left foot   2. Type 2 diabetes mellitus with stage 3a chronic kidney disease, with long-term current use of insulin  (HCC)     Meds ordered this encounter  Medications   mupirocin  ointment (BACTROBAN ) 2 %    Sig: Apply 1 Application topically 2 (two) times daily.    Dispense:  30 g    Refill:  2  # Ingrown toenail left first toe - Findings reviewed with patient - Discussed  proceeding with ingrown toenail removal of medial border with chemical matrixectomy - Risk, benefits alternative therapies were discussed at length.  Written consent obtained - Take over-the-counter Tylenol  and ibuprofen every 6 hours as needed for pain - Aftercare instructions discussed at length with written instructions dispensed - Epsom salt soaks twice a day for 15 to 20 minutes, apply bandage.  Mupirocin  sent to patient's pharmacy to be applied for the first week, small amount to the nail avulsion site. - Is diabetic but controlled, pulses intact. - Follow-up in 2 weeks for nail check, diabetic footcare.   Procedure: Partial nail avulsion left first toe medial border with chemical matricectomy Discussed patient's condition today.  After obtaining patient consent, the left hallux was anesthetized with a 50:50 mixture of 1% lidocaine  plain and 0.5% bupivacaine  plain for a total of 3cc's administered.  Betadine skin prep.  Upon confirmation of anesthesia, a freer elevator was utilized to free the left hallux medial nail border from the nail bed.  The nail border was then avulsed proximal to the eponychium and removed in toto.  The area was inspected for any remaining spicules.  A chemical matrixectomy was performed with phenol and neutralized with isopropyl alcohol solution.  Antibiotic ointment and a DSD were applied, followed by a Coban dressing.  Patient tolerated the anesthetic and procedure well and  will f/u in 2-3 weeks for recheck.  Patient given post-procedure instructions for daily 20-minute Epsom salt soaks, antibiotic ointment and daily use of Bandaids until toe starts to dry / form eschar.   Patient requesting assistance with other toenails which are thickened dystrophic and mycotic.  We will proceed with diabetic footcare at [name] follow-up visit. Return in about 2 weeks (around 09/11/2024) for Nail Check, Diabetic Foot Care.   Ethan Saddler, DPM, AACFAS [facility]      [address] [place], [place] [address]                Office [phone]  Fax [phone]        [1]  Allergies Allergen Reactions   Egg Protein-Containing Drug Products Hives and Other (See Comments)    FLU VACCINE: caused hives     Influenza Vaccines Hives    Vaccine from Egg derived product causes Hives   Crestor  [Rosuvastatin ]     Made [name] arm feel like it was numb.   Effexor  [Venlafaxine ]     felt weird   Lantus  [Insulin  Glargine] Other (See Comments)    NOT EFFECTIVE >> sugars increase to >600!   Metformin And Related Swelling    ANGIOEDEMA   "

## 2024-08-29 ENCOUNTER — Telehealth: Payer: Self-pay | Admitting: Lab

## 2024-08-29 ENCOUNTER — Encounter: Payer: Self-pay | Admitting: Physician Assistant

## 2024-08-29 LAB — CBC WITH DIFFERENTIAL/PLATELET
Basophils Absolute: 0.1 x10E3/uL (ref 0.0–0.2)
Basos: 1 %
EOS (ABSOLUTE): 0.2 x10E3/uL (ref 0.0–0.4)
Eos: 4 %
Hematocrit: 44.8 % (ref 34.0–46.6)
Hemoglobin: 13.7 g/dL (ref 11.1–15.9)
Immature Grans (Abs): 0 x10E3/uL (ref 0.0–0.1)
Immature Granulocytes: 0 %
Lymphocytes Absolute: 2.5 x10E3/uL (ref 0.7–3.1)
Lymphs: 49 %
MCH: 23.5 pg — ABNORMAL LOW (ref 26.6–33.0)
MCHC: 30.6 g/dL — ABNORMAL LOW (ref 31.5–35.7)
MCV: 77 fL — ABNORMAL LOW (ref 79–97)
Monocytes Absolute: 0.4 x10E3/uL (ref 0.1–0.9)
Monocytes: 7 %
Neutrophils Absolute: 2 x10E3/uL (ref 1.4–7.0)
Neutrophils: 39 %
Platelets: 293 x10E3/uL (ref 150–450)
RBC: 5.84 x10E6/uL — ABNORMAL HIGH (ref 3.77–5.28)
RDW: 16.1 % — ABNORMAL HIGH (ref 11.7–15.4)
WBC: 5.2 x10E3/uL (ref 3.4–10.8)

## 2024-08-29 LAB — HEPATITIS C ANTIBODY: Hep C Virus Ab: NONREACTIVE

## 2024-08-29 LAB — LIPID PANEL
Chol/HDL Ratio: 2.9 ratio (ref 0.0–4.4)
Cholesterol, Total: 137 mg/dL (ref 100–199)
HDL: 48 mg/dL
LDL Chol Calc (NIH): 59 mg/dL (ref 0–99)
Triglycerides: 181 mg/dL — ABNORMAL HIGH (ref 0–149)
VLDL Cholesterol Cal: 30 mg/dL (ref 5–40)

## 2024-08-29 LAB — CMP14+EGFR
ALT: 19 IU/L (ref 0–32)
AST: 15 IU/L (ref 0–40)
Albumin: 4.2 g/dL (ref 3.8–4.9)
Alkaline Phosphatase: 88 IU/L (ref 49–135)
BUN/Creatinine Ratio: 8 — ABNORMAL LOW (ref 9–23)
BUN: 11 mg/dL (ref 6–24)
Bilirubin Total: 0.3 mg/dL (ref 0.0–1.2)
CO2: 20 mmol/L (ref 20–29)
Calcium: 9.6 mg/dL (ref 8.7–10.2)
Chloride: 107 mmol/L — ABNORMAL HIGH (ref 96–106)
Creatinine, Ser: 1.35 mg/dL — ABNORMAL HIGH (ref 0.57–1.00)
Globulin, Total: 2.5 g/dL (ref 1.5–4.5)
Glucose: 120 mg/dL — ABNORMAL HIGH (ref 70–99)
Potassium: 5 mmol/L (ref 3.5–5.2)
Sodium: 140 mmol/L (ref 134–144)
Total Protein: 6.7 g/dL (ref 6.0–8.5)
eGFR: 46 mL/min/1.73 — ABNORMAL LOW

## 2024-08-29 LAB — TSH: TSH: 1.49 u[IU]/mL (ref 0.450–4.500)

## 2024-08-29 LAB — VITAMIN B12: Vitamin B-12: 262 pg/mL (ref 232–1245)

## 2024-08-29 LAB — VITAMIN D 25 HYDROXY (VIT D DEFICIENCY, FRACTURES): Vit D, 25-Hydroxy: 13.1 ng/mL — ABNORMAL LOW (ref 30.0–100.0)

## 2024-08-29 NOTE — Telephone Encounter (Signed)
 Ok for letter for patient to work from home this week(yesterday through Sunday) due to medical condition and procedure she cannot come into office. Please allow patient to work from home.

## 2024-08-29 NOTE — Progress Notes (Signed)
 [place]ia,   GFR down a little.  Liver enzymes look good.  Cholesterol looks great.  Vitamin D  very low. Are you taking any vitamin D ?  Thyroid normal.  B12 really low. I think you should consider monthly shots. Are you ok with this?   I ok'd letter to work from home the rest of this week.

## 2024-08-29 NOTE — Telephone Encounter (Signed)
 Patient is needing a letter stating the type of shoes recommended for her to wear and also for her to work from home she is unable to wear the shoes recommended. She stated she currently works from home three days a week needs letter to cover the other two days please inform patient when complete.

## 2024-08-29 NOTE — Telephone Encounter (Signed)
 Ok to set up with b12 shots monthly. Can you send refill of high dose weekly vitamin D .

## 2024-08-30 ENCOUNTER — Ambulatory Visit (INDEPENDENT_AMBULATORY_CARE_PROVIDER_SITE_OTHER)

## 2024-08-30 VITALS — Temp 97.5°F

## 2024-08-30 DIAGNOSIS — E538 Deficiency of other specified B group vitamins: Secondary | ICD-10-CM

## 2024-08-30 MED ORDER — VITAMIN D (ERGOCALCIFEROL) 1.25 MG (50000 UNIT) PO CAPS: 50000.0000 [IU] | ORAL_CAPSULE | ORAL | 0 refills | Status: AC

## 2024-08-30 MED ORDER — CYANOCOBALAMIN 1000 MCG/ML IJ SOLN
1000.0000 ug | INTRAMUSCULAR | Status: AC
  Administered 2024-08-30: 1000 ug via INTRAMUSCULAR

## 2024-08-30 NOTE — Progress Notes (Signed)
" ° °  Subjective:    Patient ID: Jasmine [name], female    DOB: [birth year], 59 y.o.   MRN: [id number]  HPI  Patient is here for [name] initial monthly B12 injections. Jasmine [name] didn't specify how long she wanted to get the injections before she would like to recheck. Denies muscle cramps, weakness, or irregular heart heart rate.  Review of Systems     Objective:   Physical Exam        Assessment & Plan:   Patient given injection in [name] LD. Tolerated well no redness or swelling noted at the site. Patient advised to RTC in 30 days for next injection. (Around 09/29/24) Patient is also asking for [name] work note to be written for every Mon. and tues. To work from home until [name] f/u up with the foot specialist on 09/16/24. Please advise if this okay to write. "

## 2024-08-30 NOTE — Telephone Encounter (Signed)
 Patient scheduled for initial B12 injection as nurse visit today and refill of Vit D sent to pharmacy at provider's request.

## 2024-08-31 ENCOUNTER — Telehealth: Payer: Self-pay | Admitting: Podiatry

## 2024-08-31 NOTE — Telephone Encounter (Signed)
 lft mess for pt to call to adv what she needs in work note.

## 2024-09-01 ENCOUNTER — Telehealth: Payer: Self-pay | Admitting: Podiatry

## 2024-09-01 ENCOUNTER — Encounter: Payer: Self-pay | Admitting: Podiatry

## 2024-09-01 NOTE — Telephone Encounter (Signed)
 pt lft mess about work note. cld bk and she wants remote work starting 09/04/24-10/09/24-she can return to office. She wks remote wed-friday already. will email her at work [email or web address]

## 2024-09-07 ENCOUNTER — Encounter: Payer: Self-pay | Admitting: Physician Assistant

## 2024-09-15 ENCOUNTER — Ambulatory Visit: Admitting: Podiatry

## 2024-09-15 ENCOUNTER — Encounter: Payer: Self-pay | Admitting: Podiatry

## 2024-09-15 DIAGNOSIS — Z794 Long term (current) use of insulin: Secondary | ICD-10-CM

## 2024-09-15 DIAGNOSIS — B351 Tinea unguium: Secondary | ICD-10-CM

## 2024-09-15 NOTE — Progress Notes (Unsigned)
 Right first nail specimen  Dfc today  Left medial border doing well still some tenderness

## 2024-09-27 ENCOUNTER — Ambulatory Visit

## 2024-10-19 ENCOUNTER — Ambulatory Visit: Admitting: Podiatry

## 2024-11-20 ENCOUNTER — Ambulatory Visit: Admitting: Internal Medicine

## 2025-08-22 ENCOUNTER — Encounter: Admitting: Physician Assistant
